# Patient Record
Sex: Male | Born: 1975
Health system: Southern US, Community
[De-identification: ages and names within clinical notes are randomized; demographics above are authoritative.]

## PROBLEM LIST (undated history)

## (undated) DIAGNOSIS — B1910 Unspecified viral hepatitis B without hepatic coma: Secondary | ICD-10-CM

## (undated) DIAGNOSIS — E1110 Type 2 diabetes mellitus with ketoacidosis without coma: Secondary | ICD-10-CM

## (undated) DIAGNOSIS — N289 Disorder of kidney and ureter, unspecified: Secondary | ICD-10-CM

## (undated) DIAGNOSIS — K219 Gastro-esophageal reflux disease without esophagitis: Secondary | ICD-10-CM

## (undated) DIAGNOSIS — I10 Essential (primary) hypertension: Secondary | ICD-10-CM

## (undated) DIAGNOSIS — I639 Cerebral infarction, unspecified: Secondary | ICD-10-CM

## (undated) DIAGNOSIS — M069 Rheumatoid arthritis, unspecified: Secondary | ICD-10-CM

## (undated) DIAGNOSIS — I2510 Atherosclerotic heart disease of native coronary artery without angina pectoris: Secondary | ICD-10-CM

## (undated) DIAGNOSIS — E119 Type 2 diabetes mellitus without complications: Secondary | ICD-10-CM

## (undated) HISTORY — PX: CORONARY ANGIOPLASTY WITH STENT PLACEMENT: SHX49

---

## 2017-02-02 DIAGNOSIS — I639 Cerebral infarction, unspecified: Secondary | ICD-10-CM

## 2017-02-02 HISTORY — DX: Cerebral infarction, unspecified: I63.9

## 2017-02-20 ENCOUNTER — Emergency Department (HOSPITAL_COMMUNITY)
Admission: EM | Admit: 2017-02-20 | Discharge: 2017-02-21 | Disposition: A | Discharge status: Home / Self Care | Payer: Medicaid Other | Attending: Emergency Medicine | Admitting: Emergency Medicine

## 2017-02-20 ENCOUNTER — Encounter (HOSPITAL_COMMUNITY): Payer: Self-pay | Admitting: *Deleted

## 2017-02-20 DIAGNOSIS — M545 Low back pain, unspecified: Secondary | ICD-10-CM

## 2017-02-20 DIAGNOSIS — R112 Nausea with vomiting, unspecified: Secondary | ICD-10-CM

## 2017-02-20 DIAGNOSIS — R197 Diarrhea, unspecified: Secondary | ICD-10-CM | POA: Diagnosis not present

## 2017-02-20 DIAGNOSIS — R109 Unspecified abdominal pain: Secondary | ICD-10-CM | POA: Diagnosis not present

## 2017-02-20 DIAGNOSIS — R739 Hyperglycemia, unspecified: Secondary | ICD-10-CM | POA: Insufficient documentation

## 2017-02-20 HISTORY — DX: Type 2 diabetes mellitus without complications: E11.9

## 2017-02-20 HISTORY — DX: Disorder of kidney and ureter, unspecified: N28.9

## 2017-02-20 HISTORY — DX: Cerebral infarction, unspecified: I63.9

## 2017-02-20 HISTORY — DX: Essential (primary) hypertension: I10

## 2017-02-20 LAB — CBC
HCT: 37.8 % — ABNORMAL LOW (ref 39.0–52.0)
Hemoglobin: 13 g/dL (ref 13.0–17.0)
MCH: 30 pg (ref 26.0–34.0)
MCHC: 34.4 g/dL (ref 30.0–36.0)
MCV: 87.1 fL (ref 78.0–100.0)
PLATELETS: 289 10*3/uL (ref 150–400)
RBC: 4.34 MIL/uL (ref 4.22–5.81)
RDW: 13.8 % (ref 11.5–15.5)
WBC: 9.4 10*3/uL (ref 4.0–10.5)

## 2017-02-20 LAB — BASIC METABOLIC PANEL
Anion gap: 6 (ref 5–15)
BUN: 19 mg/dL (ref 6–20)
CHLORIDE: 95 mmol/L — AB (ref 101–111)
CO2: 31 mmol/L (ref 22–32)
CREATININE: 0.95 mg/dL (ref 0.61–1.24)
Calcium: 9.4 mg/dL (ref 8.9–10.3)
GFR calc Af Amer: >60 mL/min (ref >60)
GFR calc non Af Amer: >60 mL/min (ref >60)
Glucose, Bld: 425 mg/dL — ABNORMAL HIGH (ref 65–99)
Potassium: 4.1 mmol/L (ref 3.5–5.1)
Sodium: 132 mmol/L — ABNORMAL LOW (ref 135–145)

## 2017-02-20 LAB — CBG MONITORING, ED
GLUCOSE-CAPILLARY: 451 mg/dL — AB (ref 65–99)
GLUCOSE-CAPILLARY: 466 mg/dL — AB (ref 65–99)

## 2017-02-20 LAB — URINALYSIS, ROUTINE W REFLEX MICROSCOPIC
BILIRUBIN URINE: NEGATIVE
Bacteria, UA: NONE SEEN
Glucose, UA: >=500 mg/dL — AB
Hgb urine dipstick: NEGATIVE
Ketones, ur: 5 mg/dL — AB
Leukocytes, UA: NEGATIVE
Nitrite: NEGATIVE
Protein, ur: >=300 mg/dL — AB
SPECIFIC GRAVITY, URINE: 1.036 — AB (ref 1.005–1.030)
pH: 5 (ref 5.0–8.0)

## 2017-02-20 NOTE — ED Notes (Signed)
Pt. States having inability to control bowel and bladder. Pt. States its been going on since Sunday. Pt. Had a stroke this month, approximately 02/05/2017.

## 2017-02-20 NOTE — ED Notes (Signed)
CBG 466 

## 2017-02-20 NOTE — ED Notes (Signed)
After looking through pt chart, no one ever placed any triage orders. Will place and bring pt to triage and obtain blood, CBG, UA

## 2017-02-20 NOTE — ED Triage Notes (Signed)
Pt reports being diabetic, reports cbg >400. Having n/v/d and incontinence since this am.

## 2017-02-21 ENCOUNTER — Emergency Department (HOSPITAL_COMMUNITY): Payer: Medicaid Other

## 2017-02-21 LAB — CBG MONITORING, ED: Glucose-Capillary: 274 mg/dL — ABNORMAL HIGH (ref 65–99)

## 2017-02-21 LAB — C DIFFICILE QUICK SCREEN W PCR REFLEX
C DIFFICILE (CDIFF) TOXIN: NEGATIVE
C Diff antigen: NEGATIVE
C Diff interpretation: NOT DETECTED

## 2017-02-21 MED ORDER — IOPAMIDOL (ISOVUE-300) INJECTION 61%
INTRAVENOUS | Status: AC
  Administered 2017-02-21: 100 mL
  Filled 2017-02-21: qty 100

## 2017-02-21 MED ORDER — MORPHINE SULFATE (PF) 4 MG/ML IV SOLN
4.0000 mg | Freq: Once | INTRAVENOUS | Status: AC
  Administered 2017-02-21: 4 mg via INTRAVENOUS
  Filled 2017-02-21: qty 1

## 2017-02-21 MED ORDER — INSULIN ASPART 100 UNIT/ML ~~LOC~~ SOLN
6.0000 [IU] | Freq: Once | SUBCUTANEOUS | Status: AC
  Administered 2017-02-21: 6 [IU] via INTRAVENOUS
  Filled 2017-02-21: qty 1

## 2017-02-21 MED ORDER — MORPHINE SULFATE (PF) 4 MG/ML IV SOLN
4.0000 mg | Freq: Once | INTRAVENOUS | Status: AC
  Administered 2017-02-21: 4 mg via INTRAVENOUS
  Filled 2017-02-21 (×2): qty 1

## 2017-02-21 MED ORDER — PROMETHAZINE HCL 25 MG PO TABS: 25.0000 mg | ORAL_TABLET | Freq: Four times a day (QID) | ORAL | 0 refills | Status: DC | PRN

## 2017-02-21 MED ORDER — ONDANSETRON HCL 4 MG/2ML IJ SOLN
4.0000 mg | Freq: Once | INTRAMUSCULAR | Status: AC
  Administered 2017-02-21: 4 mg via INTRAVENOUS
  Filled 2017-02-21: qty 2

## 2017-02-21 MED ORDER — LOPERAMIDE HCL 2 MG PO CAPS: 2.0000 mg | ORAL_CAPSULE | Freq: Four times a day (QID) | ORAL | 0 refills | Status: DC | PRN

## 2017-02-21 MED ORDER — SODIUM CHLORIDE 0.9 % IV SOLN
INTRAVENOUS | Status: DC
  Administered 2017-02-21: 01:00:00 via INTRAVENOUS

## 2017-02-21 MED ORDER — OXYCODONE-ACETAMINOPHEN 5-325 MG PO TABS: 1.0000 | ORAL_TABLET | Freq: Four times a day (QID) | ORAL | 0 refills | Status: DC | PRN

## 2017-02-21 NOTE — ED Notes (Signed)
Pt currently in CT.

## 2017-02-21 NOTE — ED Notes (Addendum)
Pt. To MRI via stretcher. 

## 2017-02-21 NOTE — ED Provider Notes (Signed)
TIME SEEN: 12:28 AM  CHIEF COMPLAINT: Multiple complaints  HPI: Patient is a 41 year old male with history of hypertension, insulin-dependent diabetes, recent stroke at the beginning of September who presents to the emergency department with multiple complaints. Unfortunately history is very limited as patient is a poor historian. His niece is at the bedside who does provide further details. Patient states that he has had nausea, vomiting and diarrhea for the past 3 days. Mostly diarrhea but did have 2 episodes of vomiting this morning. No bloody stools or melena. His blood sugar has also been elevated. He reports he does have insulin. No fever. He is having lower abdominal pain that he describes as a tightness. No dysuria or hematuria.   Patient also complaining of lower back pain which is chronic for him. No injury to his back. He states that he feels like his legs are weak. When I discussed this with him further he then states that it is his left leg is weak and his niece reports this is what was affected with his stroke. No numbness or tingling. He has no urinary retention but has reported some bowel incontinence but states that is because he feels he can't get to the bathroom fast enough because of his diarrhea. It is difficult to ascertain from him if he is having urinary incontinence but he denies urinary retention.  ROS: See HPI, very limited as patient is a poor historian Constitutional: no fever  Eyes: no drainage  ENT: no runny nose   Cardiovascular:  no chest pain  Resp: no SOB  GI: Vomiting and diarrhea GU: no dysuria Integumentary: no rash  Allergy: no hives  Musculoskeletal: no leg swelling  Neurological: no slurred speech ROS otherwise negative  PAST MEDICAL HISTORY/PAST SURGICAL HISTORY:  Past Medical History:  Diagnosis Date  . Diabetes mellitus without complication (HCC)   . Hypertension   . Renal disorder   . Stroke [facility])     MEDICATIONS:  Prior to Admission  medications   Medication Sig Start Date End Date Taking? Authorizing Provider  insulin NPH-regular Human (NOVOLIN 70/30) (70-30) 100 UNIT/ML injection Inject 30 Units into the skin 3 (three) times daily.   Yes [provider]    ALLERGIES:  No Known Allergies  SOCIAL HISTORY:  Social History  Substance Use Topics  . Smoking status: Current Some Day Smoker  . Smokeless tobacco: Not on file  . Alcohol use Yes    FAMILY HISTORY: History reviewed. No pertinent family history.  EXAM: BP (!) 122/97   Pulse 86   Temp 98.4 F (36.9 C) (Oral)   Resp 16   SpO2 98%  CONSTITUTIONAL: Alert and oriented and responds appropriately to questions; Chronically ill-appearing, afebrile, nontoxic HEAD: Normocephalic EYES: Conjunctivae clear, pupils appear equal, EOMI ENT: normal nose; moist mucous membranes NECK: Supple, no meningismus, no nuchal rigidity, no LAD  CARD: RRR; S1 and S2 appreciated; no murmurs, no clicks, no rubs, no gallops RESP: Normal chest excursion without splinting or tachypnea; breath sounds clear and equal bilaterally; no wheezes, no rhonchi, no rales, no hypoxia or respiratory distress, speaking full sentences ABD/GI: Normal bowel sounds; non-distended; soft, tender throughout the lower abdomen, no rebound, no guarding, no peritoneal signs, no hepatosplenomegaly RECTAL:  Normal soft brown stool, no blood or melena, normal rectal tone BACK:  The back appears normal and is tender diffusely throughout the lumbar spine, no thoracic tenderness, no midline step-off or deformity, there is no CVA tenderness EXT: Normal ROM in all joints; non-tender  to palpation; no edema; normal capillary refill; no cyanosis, no calf tenderness or swelling    SKIN: Normal color for age and race; warm; no rash NEURO: Moves all extremities equally, strength 5/5 in all 4 extremities, no saddle anesthesia, normal sensation, patient is able to ambulate with assistance, he has been using a cane  since discharge from the hospital after his stroke PSYCH: The patient's mood and manner are appropriate. Grooming and personal hygiene are appropriate.  MEDICAL DECISION MAKING: Patient here with multiple complaints. He is tender throughout his lower abdomen and complaining of vomiting and diarrhea. We have sent a stool culture. Labs obtained in triage are unremarkable other than hyperglycemia without DKA. We'll obtain a CT of his abdomen and pelvis for further evaluation. Will give IV fluids, pain and nausea medicine and reassess.   Patient also complaining of back pain and feels like his legs are weak. On my examination he has great strength and normal sensation. He has normal rectal tone but describes fecal incontinence. This may be because of his diarrhea. Unfortunately he is a very poor historian and is uncooperative with a lot of the exam. No thoracic back pain. We'll obtain an MRI of his spine without contrast to rule out cauda equina. Denies any new injury to the back. I do not feel this time he needs MRIs of his cervical and thoracic spine. Pain is all in the lower back. He has normal neuro exam on my evaluation except he does ambulate with a slight limp but his niece reports she thinks this is because of some weakness in his left leg from his recent stroke. We'll also attempt to obtain outside hospital records from [place]. Niece reports that she brought him here from [place] because "he is too sick" to stand [place] alone. She reports that he was told that he had possible CHF and had to have dialysis during his last admission. Here his creatinine is normal.  ED PROGRESS: Patient's CT of his abdomen and pelvis shows no acute abnormality. MRI of the lumbar spine shows no cauda equina or high-grade spinal stenosis. He does have mild canal stenosis at L4-L5 due to a small disc bulge. This could be causing his pain but I doubt this is the cause of his neurologic deficits. Also mild right and  mild-to-moderate left L5-S1 neural foraminal stenosis. He does not have any radicular symptoms. I do not feel he needs emergent neurosurgical consultation. He has not had any vomiting here. I suspect viral gastroenteritis and exacerbation of his chronic symptoms. Have given him a prescription for a walker which may help him get around. Also given a list of primary care physicians in this area to establish care with. We'll discharge with brief course of Percocet for pain control as well as nausea medicine and medicine for diarrhea. I feel he is safe to be discharged. I do not feel he needs antibiotics, admission. His blood sugar has improved to the 270s with gentle IV hydration and insulin. Patient and niece are comfortable with this plan. We have discussed at length return precautions.    Patient's C. difficile has come back negative. I have received records from [facility] in [place]. It appears he was seen in the emergency department at that time for back pain. He had an x-ray of his lumbar spine which showed minimal grade 1 retrolisthesis of L1 over L2 and L2 over L3. They document that he had recently been admitted to another hospital,  [facility] for hyperglycemia due to medical noncompliance. They did not send any records stating that he was admitted to the hospital or had a stroke. We did not receive records from [facility].    At this time, I do not feel there is any life-threatening condition present. I have reviewed and discussed all results (EKG, imaging, lab, urine as appropriate) and exam findings with patient/family. I have reviewed nursing notes and appropriate previous records.  I feel the patient is safe to be discharged home without further emergent workup and can continue workup as an outpatient as needed. Discussed usual and customary return precautions. Patient/family verbalize understanding and are comfortable with this plan.  Outpatient follow-up has been  provided if needed. All questions have been answered.      Ward, Layla Maw, DO 02/21/17 [phone]

## 2017-02-21 NOTE — Discharge Instructions (Signed)
To find a primary care or specialty doctor please call [phone] or [phone] to access "[facility] Find a Doctor Service." ° °You may also go on the [facility] website at [email or web address] ° °There are also multiple [facility], [facility] practices throughout the [place] that are frequently accepting new patients. You may find a clinic that is close to your home and contact them. ° °[facility] -  °[address] °[place] [address] °[phone] ° ° °[facility] -  °[address] °[place] [address] [phone] ° ° °[facility] - °[address]  °[place] [address] °[phone] ° ° °

## 2017-02-21 NOTE — ED Notes (Signed)
Pt returned from CT °

## 2017-02-21 NOTE — ED Notes (Signed)
Pt. Unable to sign due to technology issues.

## 2017-03-08 ENCOUNTER — Emergency Department (HOSPITAL_COMMUNITY): Payer: Medicaid Other

## 2017-03-08 ENCOUNTER — Encounter (HOSPITAL_COMMUNITY): Payer: Self-pay | Admitting: Emergency Medicine

## 2017-03-08 ENCOUNTER — Inpatient Hospital Stay (HOSPITAL_COMMUNITY)
Admission: EM | Admit: 2017-03-08 | Discharge: 2017-03-12 | DRG: 638 | Disposition: A | Discharge status: Home Health | Payer: Medicaid Other | Attending: Internal Medicine | Admitting: Internal Medicine

## 2017-03-08 DIAGNOSIS — I2510 Atherosclerotic heart disease of native coronary artery without angina pectoris: Secondary | ICD-10-CM | POA: Diagnosis present

## 2017-03-08 DIAGNOSIS — K219 Gastro-esophageal reflux disease without esophagitis: Secondary | ICD-10-CM | POA: Diagnosis present

## 2017-03-08 DIAGNOSIS — E1065 Type 1 diabetes mellitus with hyperglycemia: Secondary | ICD-10-CM

## 2017-03-08 DIAGNOSIS — Z9111 Patient's noncompliance with dietary regimen: Secondary | ICD-10-CM | POA: Diagnosis not present

## 2017-03-08 DIAGNOSIS — E1040 Type 1 diabetes mellitus with diabetic neuropathy, unspecified: Secondary | ICD-10-CM | POA: Diagnosis present

## 2017-03-08 DIAGNOSIS — E860 Dehydration: Secondary | ICD-10-CM | POA: Diagnosis present

## 2017-03-08 DIAGNOSIS — R112 Nausea with vomiting, unspecified: Secondary | ICD-10-CM | POA: Diagnosis present

## 2017-03-08 DIAGNOSIS — E875 Hyperkalemia: Secondary | ICD-10-CM | POA: Diagnosis present

## 2017-03-08 DIAGNOSIS — I10 Essential (primary) hypertension: Secondary | ICD-10-CM | POA: Diagnosis present

## 2017-03-08 DIAGNOSIS — E1010 Type 1 diabetes mellitus with ketoacidosis without coma: Secondary | ICD-10-CM | POA: Diagnosis present

## 2017-03-08 DIAGNOSIS — M62838 Other muscle spasm: Secondary | ICD-10-CM | POA: Diagnosis present

## 2017-03-08 DIAGNOSIS — Z79899 Other long term (current) drug therapy: Secondary | ICD-10-CM

## 2017-03-08 DIAGNOSIS — E871 Hypo-osmolality and hyponatremia: Secondary | ICD-10-CM | POA: Diagnosis present

## 2017-03-08 DIAGNOSIS — Z955 Presence of coronary angioplasty implant and graft: Secondary | ICD-10-CM | POA: Diagnosis not present

## 2017-03-08 DIAGNOSIS — N179 Acute kidney failure, unspecified: Secondary | ICD-10-CM | POA: Diagnosis present

## 2017-03-08 DIAGNOSIS — F17200 Nicotine dependence, unspecified, uncomplicated: Secondary | ICD-10-CM | POA: Diagnosis present

## 2017-03-08 DIAGNOSIS — Z794 Long term (current) use of insulin: Secondary | ICD-10-CM

## 2017-03-08 DIAGNOSIS — Z9114 Patient's other noncompliance with medication regimen: Secondary | ICD-10-CM

## 2017-03-08 DIAGNOSIS — G629 Polyneuropathy, unspecified: Secondary | ICD-10-CM

## 2017-03-08 DIAGNOSIS — Z8673 Personal history of transient ischemic attack (TIA), and cerebral infarction without residual deficits: Secondary | ICD-10-CM | POA: Diagnosis not present

## 2017-03-08 DIAGNOSIS — E0810 Diabetes mellitus due to underlying condition with ketoacidosis without coma: Secondary | ICD-10-CM

## 2017-03-08 DIAGNOSIS — R739 Hyperglycemia, unspecified: Secondary | ICD-10-CM | POA: Diagnosis present

## 2017-03-08 LAB — RAPID URINE DRUG SCREEN, HOSP PERFORMED
Amphetamines: NOT DETECTED
BARBITURATES: NOT DETECTED
Benzodiazepines: NOT DETECTED
COCAINE: NOT DETECTED
Opiates: NOT DETECTED
TETRAHYDROCANNABINOL: POSITIVE — AB

## 2017-03-08 LAB — COMPREHENSIVE METABOLIC PANEL
ALBUMIN: 3.6 g/dL (ref 3.5–5.0)
ALT: 159 U/L — ABNORMAL HIGH (ref 17–63)
ANION GAP: 27 — AB (ref 5–15)
AST: 153 U/L — AB (ref 15–41)
Alkaline Phosphatase: 122 U/L (ref 38–126)
BUN: 25 mg/dL — AB (ref 6–20)
CHLORIDE: 87 mmol/L — AB (ref 101–111)
CO2: 12 mmol/L — ABNORMAL LOW (ref 22–32)
Calcium: 9.6 mg/dL (ref 8.9–10.3)
Creatinine, Ser: 1.76 mg/dL — ABNORMAL HIGH (ref 0.61–1.24)
GFR calc Af Amer: 53 mL/min — ABNORMAL LOW (ref >60)
GFR calc non Af Amer: 46 mL/min — ABNORMAL LOW (ref >60)
GLUCOSE: 676 mg/dL — AB (ref 65–99)
POTASSIUM: 6.3 mmol/L — AB (ref 3.5–5.1)
Sodium: 126 mmol/L — ABNORMAL LOW (ref 135–145)
Total Bilirubin: 2.3 mg/dL — ABNORMAL HIGH (ref 0.3–1.2)
Total Protein: 9.4 g/dL — ABNORMAL HIGH (ref 6.5–8.1)

## 2017-03-08 LAB — URINALYSIS, ROUTINE W REFLEX MICROSCOPIC
Bacteria, UA: NONE SEEN
Bilirubin Urine: NEGATIVE
Ketones, ur: 80 mg/dL — AB
LEUKOCYTES UA: NEGATIVE
NITRITE: NEGATIVE
PH: 5 (ref 5.0–8.0)
PROTEIN: 100 mg/dL — AB
SPECIFIC GRAVITY, URINE: 1.021 (ref 1.005–1.030)
Squamous Epithelial / LPF: NONE SEEN

## 2017-03-08 LAB — BASIC METABOLIC PANEL
Anion gap: 20 — ABNORMAL HIGH (ref 5–15)
Anion gap: 14 (ref 5–15)
BUN: 29 mg/dL — AB (ref 6–20)
BUN: 28 mg/dL — AB (ref 6–20)
CALCIUM: 8.8 mg/dL — AB (ref 8.9–10.3)
CALCIUM: 8.6 mg/dL — AB (ref 8.9–10.3)
CHLORIDE: 99 mmol/L — AB (ref 101–111)
CO2: 16 mmol/L — ABNORMAL LOW (ref 22–32)
CO2: 20 mmol/L — ABNORMAL LOW (ref 22–32)
CREATININE: 1.68 mg/dL — AB (ref 0.61–1.24)
CREATININE: 1.57 mg/dL — AB (ref 0.61–1.24)
Chloride: 101 mmol/L (ref 101–111)
GFR, EST AFRICAN AMERICAN: 56 mL/min — AB (ref >60)
GFR, EST NON AFRICAN AMERICAN: 49 mL/min — AB (ref >60)
GFR, EST NON AFRICAN AMERICAN: 53 mL/min — AB (ref >60)
Glucose, Bld: 363 mg/dL — ABNORMAL HIGH (ref 65–99)
Glucose, Bld: 217 mg/dL — ABNORMAL HIGH (ref 65–99)
Potassium: 4.2 mmol/L (ref 3.5–5.1)
Potassium: 3.8 mmol/L (ref 3.5–5.1)
SODIUM: 135 mmol/L (ref 135–145)
SODIUM: 135 mmol/L (ref 135–145)

## 2017-03-08 LAB — CBC
HCT: 37.6 % — ABNORMAL LOW (ref 39.0–52.0)
HEMATOCRIT: 42.8 % (ref 39.0–52.0)
Hemoglobin: 14.9 g/dL (ref 13.0–17.0)
Hemoglobin: 13.1 g/dL (ref 13.0–17.0)
MCH: 31.3 pg (ref 26.0–34.0)
MCH: 30.5 pg (ref 26.0–34.0)
MCHC: 34.8 g/dL (ref 30.0–36.0)
MCHC: 34.8 g/dL (ref 30.0–36.0)
MCV: 89.9 fL (ref 78.0–100.0)
MCV: 87.4 fL (ref 78.0–100.0)
PLATELETS: 302 10*3/uL (ref 150–400)
PLATELETS: 253 10*3/uL (ref 150–400)
RBC: 4.76 MIL/uL (ref 4.22–5.81)
RBC: 4.3 MIL/uL (ref 4.22–5.81)
RDW: 14.2 % (ref 11.5–15.5)
RDW: 14.5 % (ref 11.5–15.5)
WBC: 11.9 10*3/uL — AB (ref 4.0–10.5)
WBC: 14.6 10*3/uL — ABNORMAL HIGH (ref 4.0–10.5)

## 2017-03-08 LAB — BRAIN NATRIURETIC PEPTIDE: B NATRIURETIC PEPTIDE 5: 55.1 pg/mL (ref 0.0–100.0)

## 2017-03-08 LAB — I-STAT TROPONIN, ED: TROPONIN I, POC: 0 ng/mL (ref 0.00–0.08)

## 2017-03-08 LAB — CBG MONITORING, ED
GLUCOSE-CAPILLARY: 501 mg/dL — AB (ref 65–99)
GLUCOSE-CAPILLARY: 444 mg/dL — AB (ref 65–99)
GLUCOSE-CAPILLARY: 364 mg/dL — AB (ref 65–99)
Glucose-Capillary: >600 mg/dL (ref 65–99)
Glucose-Capillary: 597 mg/dL (ref 65–99)

## 2017-03-08 LAB — BLOOD GAS, VENOUS
ACID-BASE DEFICIT: 15.3 mmol/L — AB (ref 0.0–2.0)
BICARBONATE: 9.9 mmol/L — AB (ref 20.0–28.0)
FIO2: 21
O2 SAT: 81.2 %
PCO2 VEN: 22.4 mmHg — AB (ref 44.0–60.0)
PH VEN: 7.27 (ref 7.250–7.430)
Patient temperature: 98.6
pO2, Ven: 55.2 mmHg — ABNORMAL HIGH (ref 32.0–45.0)

## 2017-03-08 LAB — MAGNESIUM: MAGNESIUM: 1.9 mg/dL (ref 1.7–2.4)

## 2017-03-08 LAB — PHOSPHORUS: PHOSPHORUS: 4.2 mg/dL (ref 2.5–4.6)

## 2017-03-08 LAB — LIPASE, BLOOD: Lipase: 27 U/L (ref 11–51)

## 2017-03-08 LAB — D-DIMER, QUANTITATIVE (NOT AT ARMC): D DIMER QUANT: 0.35 ug{FEU}/mL (ref 0.00–0.50)

## 2017-03-08 LAB — GLUCOSE, CAPILLARY: GLUCOSE-CAPILLARY: 290 mg/dL — AB (ref 65–99)

## 2017-03-08 MED ORDER — INSULIN ASPART 100 UNIT/ML IV SOLN
10.0000 [IU] | Freq: Once | INTRAVENOUS | Status: DC
  Filled 2017-03-08: qty 0.1

## 2017-03-08 MED ORDER — HEPARIN SODIUM (PORCINE) 5000 UNIT/ML IJ SOLN
5000.0000 [IU] | Freq: Three times a day (TID) | INTRAMUSCULAR | Status: DC
  Administered 2017-03-09 – 2017-03-12 (×11): 5000 [IU] via SUBCUTANEOUS
  Filled 2017-03-08 (×10): qty 1

## 2017-03-08 MED ORDER — ONDANSETRON HCL 4 MG/2ML IJ SOLN
4.0000 mg | Freq: Four times a day (QID) | INTRAMUSCULAR | Status: DC | PRN
  Administered 2017-03-11 – 2017-03-12 (×2): 4 mg via INTRAVENOUS
  Filled 2017-03-08 (×2): qty 2

## 2017-03-08 MED ORDER — SODIUM CHLORIDE 0.9 % IV BOLUS (SEPSIS)
1000.0000 mL | Freq: Once | INTRAVENOUS | Status: AC
  Administered 2017-03-08: 1000 mL via INTRAVENOUS

## 2017-03-08 MED ORDER — ONDANSETRON HCL 4 MG/2ML IJ SOLN
4.0000 mg | Freq: Once | INTRAMUSCULAR | Status: AC
  Administered 2017-03-08: 4 mg via INTRAVENOUS
  Filled 2017-03-08: qty 2

## 2017-03-08 MED ORDER — PROMETHAZINE HCL 25 MG/ML IJ SOLN: 25.0000 mg | Freq: Three times a day (TID) | INTRAMUSCULAR | Status: DC | PRN

## 2017-03-08 MED ORDER — PANTOPRAZOLE SODIUM 40 MG IV SOLR
INTRAVENOUS | Status: AC
  Administered 2017-03-08: 40 mg via INTRAVENOUS
  Filled 2017-03-08: qty 40

## 2017-03-08 MED ORDER — GI COCKTAIL ~~LOC~~
30.0000 mL | Freq: Once | ORAL | Status: AC
  Administered 2017-03-08: 30 mL via ORAL
  Filled 2017-03-08: qty 30

## 2017-03-08 MED ORDER — DEXTROSE-NACL 5-0.45 % IV SOLN: INTRAVENOUS | Status: DC

## 2017-03-08 MED ORDER — SODIUM CHLORIDE 0.9 % IV SOLN
INTRAVENOUS | Status: DC
  Administered 2017-03-08: 18:00:00 via INTRAVENOUS

## 2017-03-08 MED ORDER — PROMETHAZINE HCL 25 MG/ML IJ SOLN
25.0000 mg | Freq: Once | INTRAMUSCULAR | Status: DC
  Filled 2017-03-08: qty 1

## 2017-03-08 MED ORDER — SODIUM CHLORIDE 0.9 % IV SOLN
INTRAVENOUS | Status: DC
  Administered 2017-03-08: 5.4 [IU]/h via INTRAVENOUS
  Administered 2017-03-08: 10.7 [IU]/h via INTRAVENOUS
  Filled 2017-03-08: qty 1

## 2017-03-08 MED ORDER — PANTOPRAZOLE SODIUM 40 MG IV SOLR
40.0000 mg | INTRAVENOUS | Status: DC
  Administered 2017-03-08 – 2017-03-10 (×3): 40 mg via INTRAVENOUS
  Filled 2017-03-08 (×2): qty 40

## 2017-03-08 MED ORDER — ACETAMINOPHEN 325 MG PO TABS
650.0000 mg | ORAL_TABLET | Freq: Four times a day (QID) | ORAL | Status: DC | PRN
  Administered 2017-03-09: 23:00:00 650 mg via ORAL
  Filled 2017-03-08: qty 2

## 2017-03-08 MED ORDER — DEXTROSE-NACL 5-0.45 % IV SOLN
INTRAVENOUS | Status: DC
  Administered 2017-03-08 – 2017-03-09 (×2): via INTRAVENOUS

## 2017-03-08 MED ORDER — SODIUM CHLORIDE 0.9 % IV SOLN
INTRAVENOUS | Status: DC
  Administered 2017-03-08: 20:00:00 via INTRAVENOUS

## 2017-03-08 MED ORDER — MORPHINE SULFATE (PF) 4 MG/ML IV SOLN
2.0000 mg | INTRAVENOUS | Status: DC | PRN
  Administered 2017-03-10 – 2017-03-11 (×3): 2 mg via INTRAVENOUS
  Filled 2017-03-08 (×3): qty 1

## 2017-03-08 MED ORDER — SODIUM CHLORIDE 0.9 % IV SOLN
INTRAVENOUS | Status: DC
  Administered 2017-03-08: 11.5 [IU]/h via INTRAVENOUS

## 2017-03-08 NOTE — ED Notes (Signed)
ATTEMPT 2ND IV NOT SUCCESSFUL X 2.

## 2017-03-08 NOTE — H&P (Signed)
History and Physical    Matthew Riley GUR:[id number] DOB: 1975-10-28 DOA: 03/08/2017  PCP: Patient, No Pcp Per   I have briefly reviewed patients previous medical reports in [facility].  Patient coming from: home  Chief Complaint: abd pain, nausea/vomiting and high sugars.  HPI: Matthew Riley is a 41 y/o male with PMH significant for HTN, type 1 diabetes with renal disease and prior hx of CAD status post stenting; who relocated from [place] couple months ago and for the last 4 weeks or so has been w/o insulin treatment. Patient presented with 3-4 days of generalized weakness and tiredness, positive nausea, vomiting, abd pain and difficulty keeping anything down. Patient denies CP, SOB, hematuria, dysuria, HA;s or any other complaints.   ED Course: patient found to be in DKA (with CBG's > 600, anion gap 27 and CO2 12). Patient also found AKI. He received IVF's and started on insulin drip.  Review of Systems:  All other systems reviewed and apart from HPI, are negative.  Past Medical History:  Diagnosis Date  . Diabetes mellitus without complication (HCC)   . Hypertension   . Renal disorder   . Stroke [facility])     Past Surgical History:  Procedure Laterality Date  . CORONARY ANGIOPLASTY WITH STENT PLACEMENT      Social History  reports that he has been smoking.  He does not have any smokeless tobacco history on file. He reports that he drinks alcohol. He reports that he does not use drugs.  No Known Allergies  No family history on file.   Prior to Admission medications   Medication Sig Start Date End Date Taking? Authorizing Provider  insulin NPH-regular Human (NOVOLIN 70/30) (70-30) 100 UNIT/ML injection Inject 30 Units into the skin 3 (three) times daily.   Yes [provider]  loperamide (IMODIUM) 2 MG capsule Take 1 capsule (2 mg total) by mouth 4 (four) times daily as needed for diarrhea or loose stools. Patient not taking: Reported on 03/08/2017 02/21/17    Ward, Layla Maw, DO  oxyCODONE-acetaminophen (PERCOCET/ROXICET) 5-325 MG tablet Take 1-2 tablets by mouth every 6 (six) hours as needed. 02/21/17   Ward, Layla Maw, DO  promethazine (PHENERGAN) 25 MG tablet Take 1 tablet (25 mg total) by mouth every 6 (six) hours as needed for nausea or vomiting. Patient not taking: Reported on 03/08/2017 02/21/17   Ward, Layla Maw, DO    Physical Exam: Vitals:   03/08/17 1418 03/08/17 1618 03/08/17 1628 03/08/17 1700  BP: 108/70  (!) 93/57 111/72  Pulse: (!) 113  (!) 109 99  Resp: (!) 23  (!) 24 (!) 23  Temp:  98.4 F (36.9 C)    TempSrc:  Axillary    SpO2: 99%  100% 100%  Weight:  90.7 kg (200 lb)    Height:  6\' 1"  (1.854 m)        Constitutional: no fever, nCP and no SOB. Patient reporting feeling weak and tired. Eyes: PERTLA, lids and conjunctivae normal, no icterus ENMT: Mucous membranes are dry on exam; Posterior pharynx clear of any exudate or lesions. Poor dentition. Neck: supple, no masses, no thyromegaly, no bruits Respiratory: good air movement, no wheezing, no crackles clear to auscultation bilaterally, Normal respiratory effort.  Cardiovascular: tachycardia, S1 & S2 heard, no murmurs / rubs / gallops. No extremity edema. 2+ pedal pulses. No carotid bruits.  Abdomen: no guarding, positive BS, No distension, positive tenderness to palpation, no masses appreciated. hepatosplenomegaly. Bowel sounds normal.  Musculoskeletal: no  clubbing / cyanosis. No joint deformity upper and lower extremities. Good ROM, no contractures. Normal muscle tone.  Skin: no rashes, lesions, ulcers. No induration Neurologic: CN 2-12 grossly intact. Sensation intact, DTR normal. Strength 3/5 in all 4 three limbs. Except for complaints in movement and strength on LLE Psychiatric: Normal judgment and insight. Alert and oriented x 2-3 currently. Normal mood.   Labs on Admission: I have personally reviewed following labs and imaging studies  CBC:  Recent Labs Lab  03/08/17 1450  WBC 11.9*  HGB 14.9  HCT 42.8  MCV 89.9  PLT 302   Basic Metabolic Panel:  Recent Labs Lab 03/08/17 1450  NA 126*  K 6.3*  CL 87*  CO2 12*  GLUCOSE 676*  BUN 25*  CREATININE 1.76*  CALCIUM 9.6   Liver Function Tests:  Recent Labs Lab 03/08/17 1450  AST 153*  ALT 159*  ALKPHOS 122  BILITOT 2.3*  PROT 9.4*  ALBUMIN 3.6   ]CBG:  Recent Labs Lab 03/08/17 1313 03/08/17 1745 03/08/17 1902  GLUCAP >600* 597* 501*   Urine analysis:    Component Value Date/Time   COLORURINE YELLOW 03/08/2017 1748   APPEARANCEUR CLEAR 03/08/2017 1748   LABSPEC 1.021 03/08/2017 1748   PHURINE 5.0 03/08/2017 1748   GLUCOSEU >=500 (A) 03/08/2017 1748   HGBUR MODERATE (A) 03/08/2017 1748   BILIRUBINUR NEGATIVE 03/08/2017 1748   KETONESUR 80 (A) 03/08/2017 1748   PROTEINUR 100 (A) 03/08/2017 1748   NITRITE NEGATIVE 03/08/2017 1748   LEUKOCYTESUR NEGATIVE 03/08/2017 1748     Radiological Exams on Admission: Ct Abdomen Pelvis Wo Contrast  Result Date: 03/08/2017 CLINICAL DATA:  Generalized abdominal pain. EXAM: CT ABDOMEN AND PELVIS WITHOUT CONTRAST TECHNIQUE: Multidetector CT imaging of the abdomen and pelvis was performed following the standard protocol without IV contrast. COMPARISON:  CT abdomen pelvis dated March 01, 2017. FINDINGS: Lower chest: No acute abnormality. Hepatobiliary: No focal liver abnormality is seen. No gallstones, gallbladder wall thickening, or biliary dilatation. Pancreas: Unremarkable. No pancreatic ductal dilatation or surrounding inflammatory changes. Spleen: Normal in size without focal abnormality. Adrenals/Urinary Tract: Adrenal glands are unremarkable. Mild fullness of the left greater than right renal collecting systems. No renal calculi. No hydroureter. Bladder is unremarkable. Stomach/Bowel: Stomach is within normal limits. Appendix is not well seen, however there are no secondary signs of inflammation in the right lower quadrant. No  evidence of bowel wall thickening, distention, or inflammatory changes. Prominent stool throughout the colon. Vascular/Lymphatic: Minimal aortic atherosclerosis. No enlarged abdominal or pelvic lymph nodes. Reproductive: Prostate is unremarkable. Other: Mild mesenteric edema.  No pneumoperitoneum or free fluid. Musculoskeletal: No acute or significant osseous findings. IMPRESSION: 1. Limited evaluation without intravenous contrast. No definite acute intra-abdominal process. Mild fullness of the left greater than right renal collecting systems without definite evidence of obstruction. 2. Mild mesenteric and generalized subcutaneous edema. 3.  Prominent stool throughout the colon favors constipation. Electronically Signed   By: Obie Dredge M.D.   On: 03/08/2017 17:55   Dg Chest 2 View  Result Date: 03/08/2017 CLINICAL DATA:  Shortness of breath. EXAM: CHEST  2 VIEW COMPARISON:  None. FINDINGS: The heart size and mediastinal contours are within normal limits. Both lungs are clear. The visualized skeletal structures are unremarkable. IMPRESSION: Normal chest x-ray. Electronically Signed   By: Obie Dredge M.D.   On: 03/08/2017 13:32    EKG: Independently reviewed. No signs of ischemia appreciated.  Assessment/Plan Principal Problem:   DKA, type 1 (HCC) Active Problems:  Nausea & vomiting   GERD (gastroesophageal reflux disease)   Hyponatremia   AKI (acute kidney injury) (HCC)   Hyperkalemia   Type 1 diabetes with DKA: most likely from medication no complaince -troponin neg -will provide IVF's resuscitation and start insulin drip as per DKA protocol. -NPO until DKA resolved.  AKI -due to dehydration/pre-renal azotemia -will continue IVF's Minimize/avois use of nephrotoxic agents   GERD -continue PPI -PRN antiemetics  Hyponatremia/hyperkalemia -pseudo-hyponatremia -associated with dehydration and high CBG's -will continue IVF's -expecting use of insulin to help with potassium  level -will follow electrolytes  LLE weakness -after proper IVF's resuscitation and electrolytes repletion, will have PT evaluating patient and if needed will check lower back images; patient reporting some lower back pain intermittently.  DVT prophylaxis: heparin  Code Status: Full Family Communication: no family at bedside  Disposition Plan: to be determine; but anticipate discharge back home once medically stable. Consults called: none  Admission status: inpatient, stepdown, LOS > 2 midnights.   Vassie Loll MD [facility] Pager [phone]  If 7PM-7AM, please contact night-coverage [email or web address] Password [email or web address]  03/08/2017, 7:23 PM

## 2017-03-08 NOTE — ED Notes (Signed)
ED Provider at bedside.[place] AT BEDSIDE. PT AWARE OF ADMISSION

## 2017-03-08 NOTE — ED Notes (Signed)
While attending to other pt's This pt moved into assignment.

## 2017-03-08 NOTE — ED Provider Notes (Signed)
[facility]-EMERGENCY DEPT Provider Note   CSN: [id number] Arrival date & time: 03/08/17  1221     History   Chief Complaint Chief Complaint  Patient presents with  . Hyperglycemia    HPI Matthew Riley is a 41 y.o. male.  HPI Patient is a poor historian. Presents with multiple complaints. States he ran out of his insulin 2 days ago. His blood sugar has been elevated. Also complains of epigastric pain radiating into the chest. He's had multiple episodes of vomiting which is typically post tussive. States he's had increased shortness of breath especially with exertion. He continues to have low back pain. Has left leg weakness but denies any sensory changes. No urinary or bowel incontinence. No diarrhea. States he's had increased swelling to his left leg with ongoing pain. No recent trauma. Past Medical History:  Diagnosis Date  . Diabetes mellitus without complication (HCC)   . Hypertension   . Renal disorder   . Stroke [facility])     Patient Active Problem List   Diagnosis Date Noted  . DKA, type 1 (HCC) 03/08/2017    Past Surgical History:  Procedure Laterality Date  . CORONARY ANGIOPLASTY WITH STENT PLACEMENT         Home Medications    Prior to Admission medications   Medication Sig Start Date End Date Taking? Authorizing Provider  insulin NPH-regular Human (NOVOLIN 70/30) (70-30) 100 UNIT/ML injection Inject 30 Units into the skin 3 (three) times daily.   Yes [provider]  loperamide (IMODIUM) 2 MG capsule Take 1 capsule (2 mg total) by mouth 4 (four) times daily as needed for diarrhea or loose stools. Patient not taking: Reported on 03/08/2017 02/21/17   Ward, Layla Maw, DO  oxyCODONE-acetaminophen (PERCOCET/ROXICET) 5-325 MG tablet Take 1-2 tablets by mouth every 6 (six) hours as needed. 02/21/17   Ward, Layla Maw, DO  promethazine (PHENERGAN) 25 MG tablet Take 1 tablet (25 mg total) by mouth every 6 (six) hours as needed for nausea or vomiting. Patient not  taking: Reported on 03/08/2017 02/21/17   Ward, Layla Maw, DO    Family History No family history on file.  Social History Social History  Substance Use Topics  . Smoking status: Current Some Day Smoker  . Smokeless tobacco: Not on file  . Alcohol use Yes     Allergies   Patient has no known allergies.   Review of Systems Review of Systems  Constitutional: Positive for fatigue. Negative for chills and fever.  HENT: Negative for congestion and sinus pressure.   Eyes: Negative for visual disturbance.  Respiratory: Positive for cough and shortness of breath. Negative for wheezing.   Cardiovascular: Positive for chest pain and leg swelling. Negative for palpitations.  Gastrointestinal: Positive for abdominal pain, nausea and vomiting. Negative for blood in stool, constipation and diarrhea.  Genitourinary: Negative for dysuria, flank pain, frequency and hematuria.  Musculoskeletal: Positive for back pain and myalgias. Negative for neck pain and neck stiffness.  Skin: Negative for rash and wound.  Neurological: Positive for weakness. Negative for dizziness, light-headedness, numbness and headaches.  All other systems reviewed and are negative.    Physical Exam Updated Vital Signs BP (!) 93/57   Pulse (!) 109   Temp 98.4 F (36.9 C) (Axillary)   Resp (!) 24   Ht 6\' 1"  (1.854 m)   Wt 90.7 kg (200 lb)   SpO2 100%   BMI 26.39 kg/m   Physical Exam  Constitutional: He is oriented to person, place, and  time. He appears well-developed and well-nourished. No distress.  Disheveled  HENT:  Head: Normocephalic and atraumatic.  Mouth/Throat: Oropharynx is clear and moist. No oropharyngeal exudate.  Eyes: Pupils are equal, round, and reactive to light. EOM are normal.  Neck: Normal range of motion. Neck supple. No JVD present.  Cardiovascular: Normal rate and regular rhythm.  Exam reveals no gallop and no friction rub.   No murmur heard. Pulmonary/Chest: Effort normal and breath  sounds normal. No respiratory distress. He has no wheezes. He has no rales. He exhibits no tenderness.  Abdominal: Soft. Bowel sounds are normal. There is tenderness. There is no rebound and no guarding.  Epigastric tenderness to palpation. No rebound or guarding.  Musculoskeletal: Normal range of motion. He exhibits no edema or tenderness.  Patient with midline lumbar tenderness to palpation. No step-offs or deformities. Mild increase swelling in the left lower extremity compared to the right. Some mild left calf tenderness. Distal pulses are 2+.  Neurological: He is alert and oriented to person, place, and time.  5/5 motor in bilateral upper extremities. 5/5 motor and right lower extremity. 3/5 motor in left lower extremity. Sensation to light touch is intact.  Skin: Skin is warm and dry. Capillary refill takes less than 2 seconds. No rash noted. No erythema.  Psychiatric: He has a normal mood and affect. His behavior is normal.  Nursing note and vitals reviewed.    ED Treatments / Results  Labs (all labs ordered are listed, but only abnormal results are displayed) Labs Reviewed  CBC - Abnormal; Notable for the following:       Result Value   WBC 11.9 (*)    All other components within normal limits  COMPREHENSIVE METABOLIC PANEL - Abnormal; Notable for the following:    Sodium 126 (*)    Potassium 6.3 (*)    Chloride 87 (*)    CO2 12 (*)    Glucose, Bld 676 (*)    BUN 25 (*)    Creatinine, Ser 1.76 (*)    Total Protein 9.4 (*)    AST 153 (*)    ALT 159 (*)    Total Bilirubin 2.3 (*)    GFR calc non Af Amer 46 (*)    GFR calc Af Amer 53 (*)    Anion gap 27 (*)    All other components within normal limits  BLOOD GAS, VENOUS - Abnormal; Notable for the following:    pCO2, Ven 22.4 (*)    pO2, Ven 55.2 (*)    Bicarbonate 9.9 (*)    Acid-base deficit 15.3 (*)    All other components within normal limits  CBG MONITORING, ED - Abnormal; Notable for the following:     Glucose-Capillary >600 (*)    All other components within normal limits  D-DIMER, QUANTITATIVE (NOT AT [facility])  LIPASE, BLOOD  BRAIN NATRIURETIC PEPTIDE  URINALYSIS, ROUTINE W REFLEX MICROSCOPIC  RAPID URINE DRUG SCREEN, HOSP PERFORMED  I-STAT TROPONIN, ED    EKG  EKG Interpretation  Date/Time:  Friday March 08 2017 13:11:53 EDT Ventricular Rate:  109 PR Interval:    QRS Duration: 88 QT Interval:  358 QTC Calculation: 483 R Axis:   54 Text Interpretation:  Sinus tachycardia Anteroseptal infarct, age indeterminate Confirmed by Loren Racer ([id number]) on 03/08/2017 4:01:13 PM       Radiology Dg Chest 2 View  Result Date: 03/08/2017 CLINICAL DATA:  Shortness of breath. EXAM: CHEST  2 VIEW COMPARISON:  None. FINDINGS: The heart  size and mediastinal contours are within normal limits. Both lungs are clear. The visualized skeletal structures are unremarkable. IMPRESSION: Normal chest x-ray. Electronically Signed   By: Obie Dredge M.D.   On: 03/08/2017 13:32    Procedures Procedures (including critical care time)  Medications Ordered in ED Medications  insulin aspart (novoLOG) injection 10 Units (not administered)  insulin regular (NOVOLIN R,HUMULIN R) 100 Units in sodium chloride 0.9 % 100 mL (1 Units/mL) infusion (5.4 Units/hr Intravenous New Bag/Given 03/08/17 1644)  promethazine (PHENERGAN) injection 25 mg (not administered)  0.9 %  sodium chloride infusion (not administered)  ondansetron (ZOFRAN) injection 4 mg (4 mg Intravenous Given 03/08/17 1447)  gi cocktail (Maalox,Lidocaine,Donnatal) (30 mLs Oral Given 03/08/17 1447)  sodium chloride 0.9 % bolus 1,000 mL (0 mLs Intravenous Stopped 03/08/17 1541)  sodium chloride 0.9 % bolus 1,000 mL (1,000 mLs Intravenous New Bag/Given 03/08/17 1610)  sodium chloride 0.9 % bolus 1,000 mL (1,000 mLs Intravenous New Bag/Given 03/08/17 1646)    CRITICAL CARE Performed by: Ranae Palms, Dawson Albers Total critical care time: 30 minutes Critical  care time was exclusive of separately billable procedures and treating other patients. Critical care was necessary to treat or prevent imminent or life-threatening deterioration. Critical care was time spent personally by me on the following activities: development of treatment plan with patient and/or surrogate as well as nursing, discussions with consultants, evaluation of patient's response to treatment, examination of patient, obtaining history from patient or surrogate, ordering and performing treatments and interventions, ordering and review of laboratory studies, ordering and review of radiographic studies, pulse oximetry and re-evaluation of patient's condition. Initial Impression / Assessment and Plan / ED Course  I have reviewed the triage vital signs and the nursing notes.  Pertinent labs & imaging results that were available during my care of the patient were reviewed by me and considered in my medical decision making (see chart for details).    Started on insulin drip and IV fluids. Recent CT abdomen without acute abnormality. Will get a noncontrasted CT to rule out any type of obstruction or evidence of perforation. Discussed with hospitalist will see patient in the emergency department and admit to step down bed.   Final Clinical Impressions(s) / ED Diagnoses   Final diagnoses:  Diabetic ketoacidosis without coma associated with diabetes mellitus due to underlying condition (HCC)  AKI (acute kidney injury) [facility])    New Prescriptions New Prescriptions   No medications on file     Loren Racer, MD 03/08/17 [phone]

## 2017-03-08 NOTE — ED Notes (Signed)
HOSPITALIST Provider at bedside. 

## 2017-03-08 NOTE — ED Notes (Signed)
Bed: [facility] Expected date:  Expected time:  Means of arrival:  Comments: 

## 2017-03-08 NOTE — ED Triage Notes (Signed)
Per EMS-states he has been out of his insulin for 2 days-states he recently moved from [place] 2 weeks ago-states generalized abdominal pain and leg weakness

## 2017-03-08 NOTE — ED Notes (Signed)
Patient transported to CT 

## 2017-03-08 NOTE — ED Notes (Signed)
RN notified of abnormal lab 

## 2017-03-09 LAB — GLUCOSE, CAPILLARY
GLUCOSE-CAPILLARY: 119 mg/dL — AB (ref 65–99)
GLUCOSE-CAPILLARY: 175 mg/dL — AB (ref 65–99)
GLUCOSE-CAPILLARY: 206 mg/dL — AB (ref 65–99)
GLUCOSE-CAPILLARY: 210 mg/dL — AB (ref 65–99)
GLUCOSE-CAPILLARY: 215 mg/dL — AB (ref 65–99)
GLUCOSE-CAPILLARY: 296 mg/dL — AB (ref 65–99)
GLUCOSE-CAPILLARY: 87 mg/dL (ref 65–99)
Glucose-Capillary: 109 mg/dL — ABNORMAL HIGH (ref 65–99)
Glucose-Capillary: 59 mg/dL — ABNORMAL LOW (ref 65–99)
Glucose-Capillary: 59 mg/dL — ABNORMAL LOW (ref 65–99)
Glucose-Capillary: 91 mg/dL (ref 65–99)
Glucose-Capillary: 171 mg/dL — ABNORMAL HIGH (ref 65–99)
Glucose-Capillary: 280 mg/dL — ABNORMAL HIGH (ref 65–99)
Glucose-Capillary: 54 mg/dL — ABNORMAL LOW (ref 65–99)
Glucose-Capillary: 192 mg/dL — ABNORMAL HIGH (ref 65–99)
Glucose-Capillary: 47 mg/dL — ABNORMAL LOW (ref 65–99)

## 2017-03-09 LAB — MRSA PCR SCREENING: MRSA by PCR: NEGATIVE

## 2017-03-09 LAB — BASIC METABOLIC PANEL
Anion gap: 9 (ref 5–15)
Anion gap: 12 (ref 5–15)
BUN: 27 mg/dL — AB (ref 6–20)
BUN: 26 mg/dL — AB (ref 6–20)
CALCIUM: 8.4 mg/dL — AB (ref 8.9–10.3)
CO2: 25 mmol/L (ref 22–32)
CO2: 23 mmol/L (ref 22–32)
CREATININE: 1.24 mg/dL (ref 0.61–1.24)
Calcium: 8.4 mg/dL — ABNORMAL LOW (ref 8.9–10.3)
Chloride: 103 mmol/L (ref 101–111)
Chloride: 100 mmol/L — ABNORMAL LOW (ref 101–111)
Creatinine, Ser: 1.18 mg/dL (ref 0.61–1.24)
GFR calc Af Amer: >60 mL/min (ref >60)
GFR calc Af Amer: >60 mL/min (ref >60)
GLUCOSE: 104 mg/dL — AB (ref 65–99)
GLUCOSE: 249 mg/dL — AB (ref 65–99)
POTASSIUM: 3.9 mmol/L (ref 3.5–5.1)
POTASSIUM: 4 mmol/L (ref 3.5–5.1)
Sodium: 137 mmol/L (ref 135–145)
Sodium: 135 mmol/L (ref 135–145)

## 2017-03-09 LAB — HEMOGLOBIN A1C
Hgb A1c MFr Bld: 12.9 % — ABNORMAL HIGH (ref 4.8–5.6)
Mean Plasma Glucose: 323.53 mg/dL

## 2017-03-09 MED ORDER — GABAPENTIN 100 MG PO CAPS
100.0000 mg | ORAL_CAPSULE | Freq: Three times a day (TID) | ORAL | Status: DC
  Administered 2017-03-09 (×2): 100 mg via ORAL
  Filled 2017-03-09 (×2): qty 1

## 2017-03-09 MED ORDER — INSULIN ASPART 100 UNIT/ML ~~LOC~~ SOLN
0.0000 [IU] | Freq: Every day | SUBCUTANEOUS | Status: DC
  Administered 2017-03-11: 22:00:00 3 [IU] via SUBCUTANEOUS

## 2017-03-09 MED ORDER — INSULIN ASPART 100 UNIT/ML ~~LOC~~ SOLN
0.0000 [IU] | Freq: Three times a day (TID) | SUBCUTANEOUS | Status: DC
  Administered 2017-03-09: 8 [IU] via SUBCUTANEOUS
  Administered 2017-03-10: 3 [IU] via SUBCUTANEOUS
  Administered 2017-03-10: 11 [IU] via SUBCUTANEOUS
  Administered 2017-03-10: 08:00:00 8 [IU] via SUBCUTANEOUS
  Administered 2017-03-11: 09:00:00 11 [IU] via SUBCUTANEOUS
  Administered 2017-03-11: 3 [IU] via SUBCUTANEOUS
  Administered 2017-03-11: 15 [IU] via SUBCUTANEOUS
  Administered 2017-03-12: 8 [IU] via SUBCUTANEOUS
  Administered 2017-03-12: 08:00:00 11 [IU] via SUBCUTANEOUS
  Filled 2017-03-09: qty 1

## 2017-03-09 MED ORDER — INSULIN ASPART PROT & ASPART (70-30 MIX) 100 UNIT/ML ~~LOC~~ SUSP
20.0000 [IU] | Freq: Once | SUBCUTANEOUS | Status: AC
  Administered 2017-03-09: 20 [IU] via SUBCUTANEOUS
  Filled 2017-03-09: qty 10

## 2017-03-09 MED ORDER — DEXTROSE 50 % IV SOLN
16.0000 mL | Freq: Once | INTRAVENOUS | Status: AC
  Administered 2017-03-09: 16 mL via INTRAVENOUS

## 2017-03-09 MED ORDER — DEXTROSE 50 % IV SOLN: 1.0000 | Freq: Once | INTRAVENOUS | Status: AC

## 2017-03-09 MED ORDER — SODIUM CHLORIDE 0.9 % IV SOLN
INTRAVENOUS | Status: DC
  Administered 2017-03-09 – 2017-03-10 (×4): via INTRAVENOUS
  Administered 2017-03-11: 50 mL/h via INTRAVENOUS
  Administered 2017-03-12: 07:00:00 via INTRAVENOUS

## 2017-03-09 MED ORDER — PROMETHAZINE HCL 25 MG/ML IJ SOLN: 6.2500 mg | Freq: Three times a day (TID) | INTRAMUSCULAR | Status: DC | PRN

## 2017-03-09 MED ORDER — DEXTROSE 50 % IV SOLN
INTRAVENOUS | Status: AC
  Administered 2017-03-09: 50 mL
  Filled 2017-03-09: qty 50

## 2017-03-09 MED ORDER — GABAPENTIN 100 MG PO CAPS
200.0000 mg | ORAL_CAPSULE | Freq: Three times a day (TID) | ORAL | Status: DC
  Administered 2017-03-10 – 2017-03-12 (×8): 200 mg via ORAL
  Filled 2017-03-09 (×9): qty 2

## 2017-03-09 MED ORDER — DEXTROSE 50 % IV SOLN
INTRAVENOUS | Status: AC
  Filled 2017-03-09: qty 50

## 2017-03-09 MED ORDER — INSULIN ASPART PROT & ASPART (70-30 MIX) 100 UNIT/ML ~~LOC~~ SUSP
20.0000 [IU] | Freq: Two times a day (BID) | SUBCUTANEOUS | Status: DC
  Administered 2017-03-09: 20 [IU] via SUBCUTANEOUS

## 2017-03-09 NOTE — Progress Notes (Signed)
Patient report given to [facility] Nurse Ladona Ridgel. Voiced no questions or concerns. Will transfer soon. Will continue to monitor.

## 2017-03-09 NOTE — Progress Notes (Signed)
Hypoglycemic Event  CBG: 47  Treatment:1 amp d50 given   Symptoms: None. Patient remains alert and orientedX4. Currently eating graham crackers and peanut butter.   Follow-up CBG: Time: 1300 CBG Result:194  Possible Reasons for Event: DKA admission  Comments/MD notified:Dr. Gwenlyn Perking. No new orders.      Dorma Russell

## 2017-03-09 NOTE — Progress Notes (Signed)
Hypoglycemic Event  CBG: 54  Treatment: Orange Juice with sugar Symptoms:   Follow-up CBG: Time:1230 CBG Result:47   Possible Reasons for Event: Admission for DKA. Drip has been off since 0950 this am. Has drank soda, ate applesauce and crackers. Patient is alert and orientedX4 with no signs or symptoms. Will continue to monitor.   Comments/MD notified:Dr. Gwenlyn Perking. No new orders    Matthew Riley

## 2017-03-09 NOTE — Progress Notes (Signed)
[facility] PROGRESS NOTE  Knight Merklin WKG:[id number] DOB: 1975-10-31 DOA: 03/08/2017 PCP: Patient, No Pcp Per  Interim summary and HPI 41 y/o male with PMH significant for HTN, type 1 diabetes with renal disease and prior hx of CAD status post stenting; who relocated from [place] couple months ago and for the last 4 weeks or so has been w/o insulin treatment. Patient presented with 3-4 days of generalized weakness and tiredness, positive nausea, vomiting, abd pain and difficulty keeping anything down. Patient denies CP, SOB, hematuria, dysuria, HA;s or any other complaints.   Assessment/Plan:  Type 1 diabetes with DKA: most likely from medication no complaince -troponin neg -DKA resolved -will make transition to SSI and 70/30 -follow CBG and adjust hypoglycemic regimen  -follow A1C  AKI -much improved with IVF's -most likely due to dehydration/pre-renal azotemia -will continue Minimizing/avoidinge use of nephrotoxic agents  -follow BMET in am  N/V and GERD continue PPI -no further nausea or vomiting -will monitor and use PRN antiemetics.  Hyponatremia/hyperkalemia -pseudo-hyponatremia -associated with dehydration and high CBG's -improved/resolved with IVF's and correction of hyperglycemia. -will follow electrolytes  LLE weakness -after completing proper IVF's resuscitation and electrolytes repletion, will have PT evaluating patient  -patient with recent CT and MRI; no major abnormalities seen -possible component of neuropathy from diabetes. -will add neurontin  Code Status: Full Family Communication: no family at bedside  Disposition Plan: will transition to SSI and 70/30; advance diet and follow CBG's. Will also ask PT to evaluate patient given LLE weakness. Transfer to med-surg bed.   Consultants:  none  Procedures:  See below for x-ray reports  Antibiotics:  None   HPI/Subjective: No CP, no nausea, no vomiting, no fever. Patient reported  improvement/resolution of abd pain. DKA resolved. CBG still uncontrolled.  Objective: Vitals:   03/09/17 0400 03/09/17 0800  BP: 111/68 (!) 144/104  Pulse: 79 86  Resp: 12 12  Temp:  97.9 F (36.6 C)  SpO2: 99% 99%    Intake/Output Summary (Last 24 hours) at 03/09/17 0913 Last data filed at 03/09/17 0819  Gross per 24 hour  Intake          8319.27 ml  Output              402 ml  Net          7917.27 ml   Filed Weights   03/08/17 1618 03/08/17 2341  Weight: 90.7 kg (200 lb) 82.4 kg (181 lb 10.5 oz)    Exam:   General:  Afebrile, no CP, no SOB, no nausea, no vomiting. reporting LLE weakness   Cardiovascular: S1 and S2, no rubs, no gallops  Respiratory: CTA bilaterally   Abdomen: soft, NT, ND, positive BS  Musculoskeletal: no edema, no cyanosis; MS 4/5 LE due to poor effort.  Data Reviewed: Basic Metabolic Panel:  Recent Labs Lab 03/08/17 1450 03/08/17 2050 03/08/17 2313 03/09/17 0330 03/09/17 0703  NA 126* 135 135 137 135  K 6.3* 4.2 3.8 3.9 4.0  CL 87* 99* 101 103 100*  CO2 12* 16* 20* 25 23  GLUCOSE 676* 363* 217* 104* 249*  BUN 25* 29* 28* 27* 26*  CREATININE 1.76* 1.68* 1.57* 1.24 1.18  CALCIUM 9.6 8.8* 8.6* 8.4* 8.4*  MG  --  1.9  --   --   --   PHOS  --  4.2  --   --   --    Liver Function Tests:  Recent Labs Lab 03/08/17 1450  AST 153*  ALT 159*  ALKPHOS 122  BILITOT 2.3*  PROT 9.4*  ALBUMIN 3.6    Recent Labs Lab 03/08/17 1450  LIPASE 27   CBC:  Recent Labs Lab 03/08/17 1450 03/08/17 2050  WBC 11.9* 14.6*  HGB 14.9 13.1  HCT 42.8 37.6*  MCV 89.9 87.4  PLT 302 253   BNP (last 3 results)  Recent Labs  03/08/17 1450  BNP 55.1   CBG:  Recent Labs Lab 03/09/17 0245 03/09/17 0350 03/09/17 0450 03/09/17 0554 03/09/17 0653  GLUCAP 91 119* 171* 215* 280*    Recent Results (from the past 240 hour(s))  MRSA PCR Screening     Status: None   Collection Time: 03/08/17 10:42 PM  Result Value Ref Range Status   MRSA  by PCR NEGATIVE NEGATIVE Final    Comment:        The GeneXpert MRSA Assay (FDA approved for NASAL specimens only), is one component of a comprehensive MRSA colonization surveillance program. It is not intended to diagnose MRSA infection nor to guide or monitor treatment for MRSA infections.      Studies: Ct Abdomen Pelvis Wo Contrast  Result Date: 03/08/2017 CLINICAL DATA:  Generalized abdominal pain. EXAM: CT ABDOMEN AND PELVIS WITHOUT CONTRAST TECHNIQUE: Multidetector CT imaging of the abdomen and pelvis was performed following the standard protocol without IV contrast. COMPARISON:  CT abdomen pelvis dated March 01, 2017. FINDINGS: Lower chest: No acute abnormality. Hepatobiliary: No focal liver abnormality is seen. No gallstones, gallbladder wall thickening, or biliary dilatation. Pancreas: Unremarkable. No pancreatic ductal dilatation or surrounding inflammatory changes. Spleen: Normal in size without focal abnormality. Adrenals/Urinary Tract: Adrenal glands are unremarkable. Mild fullness of the left greater than right renal collecting systems. No renal calculi. No hydroureter. Bladder is unremarkable. Stomach/Bowel: Stomach is within normal limits. Appendix is not well seen, however there are no secondary signs of inflammation in the right lower quadrant. No evidence of bowel wall thickening, distention, or inflammatory changes. Prominent stool throughout the colon. Vascular/Lymphatic: Minimal aortic atherosclerosis. No enlarged abdominal or pelvic lymph nodes. Reproductive: Prostate is unremarkable. Other: Mild mesenteric edema.  No pneumoperitoneum or free fluid. Musculoskeletal: No acute or significant osseous findings. IMPRESSION: 1. Limited evaluation without intravenous contrast. No definite acute intra-abdominal process. Mild fullness of the left greater than right renal collecting systems without definite evidence of obstruction. 2. Mild mesenteric and generalized subcutaneous  edema. 3.  Prominent stool throughout the colon favors constipation. Electronically Signed   By: Obie Dredge M.D.   On: 03/08/2017 17:55   Dg Chest 2 View  Result Date: 03/08/2017 CLINICAL DATA:  Shortness of breath. EXAM: CHEST  2 VIEW COMPARISON:  None. FINDINGS: The heart size and mediastinal contours are within normal limits. Both lungs are clear. The visualized skeletal structures are unremarkable. IMPRESSION: Normal chest x-ray. Electronically Signed   By: Obie Dredge M.D.   On: 03/08/2017 13:32    Scheduled Meds: . gabapentin  100 mg Oral TID  . heparin  5,000 Units Subcutaneous Q8H  . insulin aspart  0-15 Units Subcutaneous TID WC  . insulin aspart  0-5 Units Subcutaneous QHS  . insulin aspart protamine- aspart  20 Units Subcutaneous BID WC  . pantoprazole (PROTONIX) IV  40 mg Intravenous Q24H   Continuous Infusions: . sodium chloride 100 mL/hr at 03/09/17 0902  . insulin (NOVOLIN-R) infusion 4.5 Units/hr (03/09/17 0819)    Principal Problem:   DKA, type 1 (HCC) Active Problems:   Nausea & vomiting   GERD (gastroesophageal  reflux disease)   Hyponatremia   AKI (acute kidney injury) (HCC)   Hyperkalemia    Time spent: 30 minutes    Vassie Loll  [facility] Pager [phone] If 7PM-7AM, please contact night-coverage at [email or web address], password [facility] 03/09/2017, 9:13 AM  LOS: 1 day

## 2017-03-09 NOTE — Progress Notes (Signed)
Updated Ladona Ridgel RN about patient condition and interventions. Dr. Gwenlyn Perking cleared for transfer. Will transfer at this time.

## 2017-03-10 ENCOUNTER — Encounter (HOSPITAL_COMMUNITY): Payer: Self-pay | Admitting: *Deleted

## 2017-03-10 LAB — BASIC METABOLIC PANEL
ANION GAP: 6 (ref 5–15)
BUN: 15 mg/dL (ref 6–20)
CO2: 27 mmol/L (ref 22–32)
Calcium: 8.5 mg/dL — ABNORMAL LOW (ref 8.9–10.3)
Chloride: 101 mmol/L (ref 101–111)
Creatinine, Ser: 0.84 mg/dL (ref 0.61–1.24)
GFR calc Af Amer: >60 mL/min (ref >60)
GLUCOSE: 190 mg/dL — AB (ref 65–99)
POTASSIUM: 3.5 mmol/L (ref 3.5–5.1)
Sodium: 134 mmol/L — ABNORMAL LOW (ref 135–145)

## 2017-03-10 LAB — GLUCOSE, CAPILLARY
GLUCOSE-CAPILLARY: 154 mg/dL — AB (ref 65–99)
GLUCOSE-CAPILLARY: 278 mg/dL — AB (ref 65–99)
GLUCOSE-CAPILLARY: 308 mg/dL — AB (ref 65–99)
GLUCOSE-CAPILLARY: 154 mg/dL — AB (ref 65–99)
GLUCOSE-CAPILLARY: 156 mg/dL — AB (ref 65–99)
Glucose-Capillary: 22 mg/dL — CL (ref 65–99)
Glucose-Capillary: 17 mg/dL — CL (ref 65–99)
Glucose-Capillary: 259 mg/dL — ABNORMAL HIGH (ref 65–99)

## 2017-03-10 MED ORDER — DEXTROSE 50 % IV SOLN
INTRAVENOUS | Status: AC
  Administered 2017-03-10: 50 mL
  Filled 2017-03-10: qty 50

## 2017-03-10 MED ORDER — METHOCARBAMOL 1000 MG/10ML IJ SOLN
500.0000 mg | Freq: Three times a day (TID) | INTRAMUSCULAR | Status: DC | PRN
  Administered 2017-03-11: 13:00:00 500 mg via INTRAVENOUS
  Filled 2017-03-10: qty 5
  Filled 2017-03-10: qty 550

## 2017-03-10 MED ORDER — INSULIN ASPART PROT & ASPART (70-30 MIX) 100 UNIT/ML ~~LOC~~ SUSP
25.0000 [IU] | Freq: Two times a day (BID) | SUBCUTANEOUS | Status: DC
  Administered 2017-03-10 (×2): 25 [IU] via SUBCUTANEOUS

## 2017-03-10 NOTE — Evaluation (Signed)
Physical Therapy Evaluation Patient Details Name: Matthew Riley MRN: [id number] DOB: Apr 03, 1976 Today's Date: 03/10/2017   History of Present Illness  [name] is a 41 y/o male with PMH significant for HTN, type 1 diabetes with renal disease and prior hx of CAD status post stenting; stroke. Moved from [name]  and has been  been w/o insulin treatment. Patient presented 03/08/17 with 3-4 days of generalized weakness and tiredness, positive nausea, vomiting, abd pain , patient i DKA  Clinical Impression  The patient presents with significant  Weakness of the left leg, impaired sensation of both lower legs. The patient lives with sister in 2 level home which patient reports much difficulty managing steps. Pt admitted with above diagnosis. Pt currently with functional limitations due to the deficits listed below (see PT Problem List). Pt will benefit from skilled PT to increase their independence and safety with mobility to allow discharge to the venue listed below.       Follow Up Recommendations Home health PT    Equipment Recommendations  Rolling walker with 5" wheels;Crutches    Recommendations for Other Services OT consult     Precautions / Restrictions Precautions Precautions: Fall Precaution Comments: left leg is weak      Mobility  Bed Mobility Overal bed mobility: Needs Assistance Bed Mobility: Supine to Sit   Sidelying to sit: Min assist       General bed mobility comments: assist with left leg  Transfers Overall transfer level: Needs assistance Equipment used: Rolling walker (2 wheeled) Transfers: Sit to/from Stand Sit to Stand: Min assist;From elevated surface         General transfer comment: steady assist at [facility]  Ambulation/Gait Ambulation/Gait assistance: Mod assist Ambulation Distance (Feet): 4 Feet Assistive device: Rolling walker (2 wheeled) Gait Pattern/deviations: Step-to pattern;Antalgic;Decreased step length - left;Decreased stance time -  left;Decreased dorsiflexion - left     General Gait Details: antalgic on LLE and noted decreased DF, steady assist for balance  Stairs            Wheelchair Mobility    Modified Rankin (Stroke Patients Only)       Balance                                             Pertinent Vitals/Pain Pain Assessment: Faces Faces Pain Scale: Hurts whole lot Pain Location: left leg Pain Descriptors / Indicators: Burning;Contraction;Cramping Pain Intervention(s): Limited activity within patient's tolerance;Monitored during session;Repositioned    Home Living Family/patient expects to be discharged to:: Private residence Living Arrangements: Alone;Other relatives Available Help at Discharge: Family Type of Home: House Home Access: Level entry     Home Layout: Two level Home Equipment: None      Prior Function Level of Independence: Independent         Comments: has had falls, reports much difficulty on stairs     Hand Dominance        Extremity/Trunk Assessment   Upper Extremity Assessment Upper Extremity Assessment: RUE deficits/detail;LUE deficits/detail RUE Deficits / Details: reports fingers numb RUE Sensation: history of peripheral neuropathy LUE: Unable to fully assess due to immobilization    Lower Extremity Assessment Lower Extremity Assessment: LLE deficits/detail;RLE deficits/detail RLE Deficits / Details: grossly 5/5  RLE Sensation: history of peripheral neuropathy LLE Deficits / Details: knee extension 3+, hip flexion 3, dorsiflexion 2+ LLE Sensation: history of peripheral neuropathy  Cervical / Trunk Assessment Cervical / Trunk Assessment: Normal  Communication   Communication: No difficulties  Cognition Arousal/Alertness: Awake/alert Behavior During Therapy: WFL for tasks assessed/performed Overall Cognitive Status: Within Functional Limits for tasks assessed                                         General Comments      Exercises     Assessment/Plan    PT Assessment Patient needs continued PT services  PT Problem List Decreased strength;Decreased range of motion;Decreased activity tolerance;Decreased balance;Decreased mobility;Decreased knowledge of precautions;Decreased safety awareness;Decreased knowledge of use of DME;Impaired sensation;Pain       PT Treatment Interventions DME instruction;Gait training;Stair training;Functional mobility training;Therapeutic activities;Therapeutic exercise;Patient/family education    PT Goals (Current goals can be found in the Care Plan section)  Acute Rehab PT Goals Patient Stated Goal: to walk without pain PT Goal Formulation: With patient Time For Goal Achievement: 03/24/17 Potential to Achieve Goals: Fair    Frequency Min 3X/week   Barriers to discharge Decreased caregiver support;Inaccessible home environment      Co-evaluation               AM-PAC PT "6 Clicks" Daily Activity  Outcome Measure Difficulty turning over in bed (including adjusting bedclothes, sheets and blankets)?: A Little Difficulty moving from lying on back to sitting on the side of the bed? : A Little Difficulty sitting down on and standing up from a chair with arms (e.g., wheelchair, bedside commode, etc,.)?: Unable Help needed moving to and from a bed to chair (including a wheelchair)?: Total Help needed walking in hospital room?: Total Help needed climbing 3-5 steps with a railing? : Total 6 Click Score: 10    End of Session Equipment Utilized During Treatment: Gait belt Activity Tolerance: Patient tolerated treatment well Patient left: in chair;with call bell/phone within reach;with chair alarm set Nurse Communication: Mobility status PT Visit Diagnosis: Unsteadiness on feet (R26.81);Difficulty in walking, not elsewhere classified (R26.2);History of falling (Z91.81)    Time: 1126-1202 PT Time Calculation (min) (ACUTE ONLY): 36  min   Charges:   PT Evaluation $PT Eval Low Complexity: 1 Low PT Treatments $Gait Training: 8-22 mins   PT G Codes:        {Hollace Michelli PT [phone]   Rada Hay 03/10/2017, 12:27 PM

## 2017-03-10 NOTE — Progress Notes (Signed)
[facility] PROGRESS NOTE  Trystan Akhtar KYH:[id number] DOB: 01/11/1976 DOA: 03/08/2017 PCP: Patient, No Pcp Per  Interim summary and HPI 41 y/o male with PMH significant for HTN, type 1 diabetes with renal disease and prior hx of CAD status post stenting; who relocated from [place] couple months ago and for the last 4 weeks or so has been w/o insulin treatment. Patient presented with 3-4 days of generalized weakness and tiredness, positive nausea, vomiting, abd pain and difficulty keeping anything down. Patient denies CP, SOB, hematuria, dysuria, HA;s or any other complaints.   Assessment/Plan: Type 1 diabetes with DKA: most likely from medication no compliance  -anion gap and bicarbonate remains stable -CBG's reaching 300 range after diet advance -will continue SSI and adjsut his 70/30 for better control -will require close follow up with PCP for further adjustment on hypoglycemic regimen at discharge -A1C 12.9  AKI -much improved/resolved with IVF's -most likely due to dehydration/pre-renal azotemia -will continue Monitoring trend   N/V and GERD -no further nausea or vomiting  -will continue PPI -continue PRN antiemetics  Hyponatremia/hyperkalemia -pseudo-hyponatremia -associated with dehydration and high CBG's -sodium 134 and K 3.5 -will continue monitoring trend -replete as needed   LLE weakness -after completing proper IVF's resuscitation and electrolytes repletion, patient seen by PT who recommended HH services -patient symptoms most likely associated with physical deconditioning and diabetic neuropathy -will focus efforts on controlling diabetes and will continue use of gabapentin and PRN robaxin -patient with recent CT and MRI; no major abnormalities seen  Code Status: Full Family Communication: no family at bedside  Disposition Plan: will transition to SSI and 70/30; advance diet and follow CBG's. Continue adjustment on hypoglycemic regimen. Will also  discussed with CM for [facility] services (maybe charity approach, patient w/o insurance)  Consultants:  None  Procedures:  See below for x-ray reports  Antibiotics:  None   HPI/Subjective: No CP, no SOB, no further nausea or vomiting. Continue complaining of muscle spasm and LLE numbness/weakness.   Objective: Vitals:   03/10/17 0931 03/10/17 1404  BP: (!) 140/97 (!) 151/108  Pulse: 80 85  Resp: 18 18  Temp: 97.9 F (36.6 C) 98.3 F (36.8 C)  SpO2: 100% 100%    Intake/Output Summary (Last 24 hours) at 03/10/17 1813 Last data filed at 03/10/17 1700  Gross per 24 hour  Intake             2220 ml  Output             3175 ml  Net             -955 ml   Filed Weights   03/08/17 1618 03/08/17 2341  Weight: 90.7 kg (200 lb) 82.4 kg (181 lb 10.5 oz)    Exam:   General:  No fever, no CP, no SOB, no nausea, no vomiting and no abd pain. Feeling better overall. continue to have elevated CBG's and is complaining of LLE weakness and generalized numbness/muscle spasm.   Cardiovascular: RRR, no rubs, no gallops  Respiratory: normal resp effort, CTA bilaterally  Abdomen: soft, NT, ND, positive BS  Musculoskeletal: no edema, no cyanosis. Continue to have MS 4/5 in his LLE and decrease grip. Patient also expressed intermittent diffuse muscle spasm.  Data Reviewed: Basic Metabolic Panel:  Recent Labs Lab 03/08/17 2050 03/08/17 2313 03/09/17 0330 03/09/17 0703 03/10/17 0513  NA 135 135 137 135 134*  K 4.2 3.8 3.9 4.0 3.5  CL 99* 101 103 100* 101  CO2 16*  20* 25 23 27   GLUCOSE 363* 217* 104* 249* 190*  BUN 29* 28* 27* 26* 15  CREATININE 1.68* 1.57* 1.24 1.18 0.84  CALCIUM 8.8* 8.6* 8.4* 8.4* 8.5*  MG 1.9  --   --   --   --   PHOS 4.2  --   --   --   --    Liver Function Tests:  Recent Labs Lab 03/08/17 1450  AST 153*  ALT 159*  ALKPHOS 122  BILITOT 2.3*  PROT 9.4*  ALBUMIN 3.6    Recent Labs Lab 03/08/17 1450  LIPASE 27   CBC:  Recent Labs Lab  03/08/17 1450 03/08/17 2050  WBC 11.9* 14.6*  HGB 14.9 13.1  HCT 42.8 37.6*  MCV 89.9 87.4  PLT 302 253   BNP (last 3 results)  Recent Labs  03/08/17 1450  BNP 55.1   CBG:  Recent Labs Lab 03/09/17 2233 03/10/17 0444 03/10/17 0726 03/10/17 1330 03/10/17 1655  GLUCAP 175* 154* 278* 308* 154*    Recent Results (from the past 240 hour(s))  MRSA PCR Screening     Status: None   Collection Time: 03/08/17 10:42 PM  Result Value Ref Range Status   MRSA by PCR NEGATIVE NEGATIVE Final    Comment:        The GeneXpert MRSA Assay (FDA approved for NASAL specimens only), is one component of a comprehensive MRSA colonization surveillance program. It is not intended to diagnose MRSA infection nor to guide or monitor treatment for MRSA infections.      Studies: No results found.  Scheduled Meds: . gabapentin  200 mg Oral TID  . heparin  5,000 Units Subcutaneous Q8H  . insulin aspart  0-15 Units Subcutaneous TID WC  . insulin aspart  0-5 Units Subcutaneous QHS  . insulin aspart protamine- aspart  25 Units Subcutaneous BID WC  . pantoprazole (PROTONIX) IV  40 mg Intravenous Q24H   Continuous Infusions: . sodium chloride 75 mL/hr at 03/10/17 1441  . methocarbamol (ROBAXIN)  IV      Principal Problem:   DKA, type 1 (HCC) Active Problems:   Nausea & vomiting   GERD (gastroesophageal reflux disease)   Hyponatremia   AKI (acute kidney injury) (HCC)   Hyperkalemia    Time spent: 30 minutes    05/10/17  [facility] Pager [phone] If 7PM-7AM, please contact night-coverage at [email or web address], password [facility] 03/10/2017, 6:13 PM  LOS: 2 days

## 2017-03-11 ENCOUNTER — Encounter (HOSPITAL_COMMUNITY): Payer: Self-pay | Admitting: Orthopedic Surgery

## 2017-03-11 ENCOUNTER — Telehealth: Payer: Self-pay

## 2017-03-11 DIAGNOSIS — E160 Drug-induced hypoglycemia without coma: Secondary | ICD-10-CM

## 2017-03-11 DIAGNOSIS — T383X5A Adverse effect of insulin and oral hypoglycemic [antidiabetic] drugs, initial encounter: Secondary | ICD-10-CM

## 2017-03-11 LAB — BASIC METABOLIC PANEL
ANION GAP: 9 (ref 5–15)
BUN: 12 mg/dL (ref 6–20)
CALCIUM: 8.6 mg/dL — AB (ref 8.9–10.3)
CO2: 28 mmol/L (ref 22–32)
CREATININE: 0.77 mg/dL (ref 0.61–1.24)
Chloride: 93 mmol/L — ABNORMAL LOW (ref 101–111)
GFR calc Af Amer: >60 mL/min (ref >60)
GLUCOSE: 293 mg/dL — AB (ref 65–99)
Potassium: 3.7 mmol/L (ref 3.5–5.1)
Sodium: 130 mmol/L — ABNORMAL LOW (ref 135–145)

## 2017-03-11 LAB — CBC
HEMATOCRIT: 34.9 % — AB (ref 39.0–52.0)
HEMOGLOBIN: 12.2 g/dL — AB (ref 13.0–17.0)
MCH: 30.3 pg (ref 26.0–34.0)
MCHC: 35 g/dL (ref 30.0–36.0)
MCV: 86.6 fL (ref 78.0–100.0)
Platelets: 191 10*3/uL (ref 150–400)
RBC: 4.03 MIL/uL — AB (ref 4.22–5.81)
RDW: 14 % (ref 11.5–15.5)
WBC: 4.7 10*3/uL (ref 4.0–10.5)

## 2017-03-11 LAB — GLUCOSE, CAPILLARY
GLUCOSE-CAPILLARY: 253 mg/dL — AB (ref 65–99)
GLUCOSE-CAPILLARY: 377 mg/dL — AB (ref 65–99)
GLUCOSE-CAPILLARY: 265 mg/dL — AB (ref 65–99)
Glucose-Capillary: 288 mg/dL — ABNORMAL HIGH (ref 65–99)
Glucose-Capillary: 335 mg/dL — ABNORMAL HIGH (ref 65–99)
Glucose-Capillary: 192 mg/dL — ABNORMAL HIGH (ref 65–99)

## 2017-03-11 LAB — TSH: TSH: 0.799 u[IU]/mL (ref 0.350–4.500)

## 2017-03-11 MED ORDER — INSULIN ASPART PROT & ASPART (70-30 MIX) 100 UNIT/ML ~~LOC~~ SUSP
35.0000 [IU] | Freq: Two times a day (BID) | SUBCUTANEOUS | Status: DC
  Administered 2017-03-11 – 2017-03-12 (×3): 35 [IU] via SUBCUTANEOUS

## 2017-03-11 MED ORDER — LISINOPRIL 20 MG PO TABS
20.0000 mg | ORAL_TABLET | Freq: Every day | ORAL | Status: DC
  Administered 2017-03-11 – 2017-03-12 (×2): 20 mg via ORAL
  Filled 2017-03-11 (×2): qty 1

## 2017-03-11 MED ORDER — PANTOPRAZOLE SODIUM 40 MG PO TBEC
40.0000 mg | DELAYED_RELEASE_TABLET | Freq: Every day | ORAL | Status: DC
  Administered 2017-03-11 – 2017-03-12 (×2): 40 mg via ORAL
  Filled 2017-03-11 (×2): qty 1

## 2017-03-11 MED ORDER — ACETAMINOPHEN 500 MG PO TABS
1000.0000 mg | ORAL_TABLET | Freq: Three times a day (TID) | ORAL | Status: DC | PRN
  Administered 2017-03-12 (×2): 1000 mg via ORAL
  Filled 2017-03-11 (×2): qty 2

## 2017-03-11 NOTE — Progress Notes (Signed)
Hypoglycemic Event  CBG17  Treatment: D50 IV 50 mL  Symptoms: Sweaty  Follow-up CBG: Time:2116 CBG Result156  Possible Reasons for Event: Medication regimen: medication regimen  Comments/MD notified:no    Matthew Riley, Matthew Riley

## 2017-03-11 NOTE — Progress Notes (Signed)
[facility] PROGRESS NOTE  Matthew Riley YBO:[id number] DOB: 11/22/1975 DOA: 03/08/2017 PCP: Patient, No Pcp Per  Interim summary and HPI 41 y/o male with PMH significant for HTN, type 1 diabetes with renal disease and prior hx of CAD status post stenting; who relocated from [place] couple months ago and for the last 4 weeks or so has been w/o insulin treatment. Patient presented with 3-4 days of generalized weakness and tiredness, positive nausea, vomiting, abd pain and difficulty keeping anything down. Patient denies CP, SOB, hematuria, dysuria, HA;s or any other complaints.   Assessment/Plan: Type 1 diabetes with DKA: most likely from medication no compliance. -anion gap and bicarbonate has remained stable -CBG's in the 300 range after diet advance; also experienced hypoglycemic event. -will continue SSI and adjsut his 70/30 as needed for better control -will require close follow up with PCP for further adjustment on hypoglycemic regimen at discharge -A1C 12.9  AKI -much improved/resolved with IVF's -most likely due to dehydration/pre-renal azotemia -will continue Monitoring trend   N/V and GERD -no further nausea or vomiting  -will continue PPI -continue PRN antiemetics  Hyponatremia/hyperkalemia -pseudo-hyponatremia -associated with dehydration and high CBG's -will continue monitoring trend -continue repletion as needed   LLE weakness -after completing proper IVF's resuscitation and electrolytes repletion, patient seen by PT who recommended HH services -patient symptoms most likely associated with physical deconditioning and diabetic neuropathy -will focus efforts on controlling diabetes and will continue use of gabapentin and PRN robaxin -patient with recent CT and MRI; no major abnormalities seen  Code Status: Full Family Communication: no family at bedside  Disposition Plan: will continue adjustment on insulin therapy to prevent further lows if possible and to  have better CBG's control. Advise not to skip diet. Discussed with CM for [facility] services and outpatient follow up.   Consultants:  None  Procedures:  See below for x-ray reports  Antibiotics:  None   HPI/Subjective: No CP, no SOB, no nausea, no vomiting. Patient with significant hypoglycemic event overnight.  Objective: Vitals:   03/11/17 1143 03/11/17 1424  BP: (!) 135/105 131/83  Pulse: 88 80  Resp: 16 16  Temp: 98.3 F (36.8 C) 98.6 F (37 C)  SpO2:  100%    Intake/Output Summary (Last 24 hours) at 03/11/17 1929 Last data filed at 03/11/17 1754  Gross per 24 hour  Intake          2172.08 ml  Output             3602 ml  Net         -1429.92 ml   Filed Weights   03/08/17 1618 03/08/17 2341  Weight: 90.7 kg (200 lb) 82.4 kg (181 lb 10.5 oz)    Exam:   General:  No fever, no CP, no SOB. Reports no nausea or vomiting. Overnight with significant life threatening hypoglycemic episode. Reported improvement in his muscle spasm.   Rest of physical findings unchanged from examination on 10/7; see below for details:   Cardiovascular: RRR, no rubs, no gallops  Respiratory: normal resp effort, CTA bilaterally  Abdomen: soft, NT, ND, positive BS  Musculoskeletal: no edema, no cyanosis. Continue to have MS 4/5 in his LLE and decrease grip. Patient also expressed intermittent diffuse muscle spasm.  Data Reviewed: Basic Metabolic Panel:  Recent Labs Lab 03/08/17 2050 03/08/17 2313 03/09/17 0330 03/09/17 0703 03/10/17 0513 03/11/17 0812  NA 135 135 137 135 134* 130*  K 4.2 3.8 3.9 4.0 3.5 3.7  CL 99* 101  103 100* 101 93*  CO2 16* 20* 25 23 27 28   GLUCOSE 363* 217* 104* 249* 190* 293*  BUN 29* 28* 27* 26* 15 12  CREATININE 1.68* 1.57* 1.24 1.18 0.84 0.77  CALCIUM 8.8* 8.6* 8.4* 8.4* 8.5* 8.6*  MG 1.9  --   --   --   --   --   PHOS 4.2  --   --   --   --   --    Liver Function Tests:  Recent Labs Lab 03/08/17 1450  AST 153*  ALT 159*  ALKPHOS 122   BILITOT 2.3*  PROT 9.4*  ALBUMIN 3.6    Recent Labs Lab 03/08/17 1450  LIPASE 27   CBC:  Recent Labs Lab 03/08/17 1450 03/08/17 2050 03/11/17 0812  WBC 11.9* 14.6* 4.7  HGB 14.9 13.1 12.2*  HCT 42.8 37.6* 34.9*  MCV 89.9 87.4 86.6  PLT 302 253 191   BNP (last 3 results)  Recent Labs  03/08/17 1450  BNP 55.1   CBG:  Recent Labs Lab 03/10/17 2314 03/11/17 0603 03/11/17 0745 03/11/17 1136 03/11/17 1742  GLUCAP 259* 288* 335* 192* 377*    Recent Results (from the past 240 hour(s))  MRSA PCR Screening     Status: None   Collection Time: 03/08/17 10:42 PM  Result Value Ref Range Status   MRSA by PCR NEGATIVE NEGATIVE Final    Comment:        The GeneXpert MRSA Assay (FDA approved for NASAL specimens only), is one component of a comprehensive MRSA colonization surveillance program. It is not intended to diagnose MRSA infection nor to guide or monitor treatment for MRSA infections.      Studies: No results found.  Scheduled Meds: . gabapentin  200 mg Oral TID  . heparin  5,000 Units Subcutaneous Q8H  . insulin aspart  0-15 Units Subcutaneous TID WC  . insulin aspart  0-5 Units Subcutaneous QHS  . insulin aspart protamine- aspart  35 Units Subcutaneous BID WC  . lisinopril  20 mg Oral Daily  . pantoprazole  40 mg Oral Daily   Continuous Infusions: . sodium chloride 50 mL/hr (03/11/17 0843)  . methocarbamol (ROBAXIN)  IV Stopped (03/11/17 1325)    Principal Problem:   DKA, type 1 (HCC) Active Problems:   Nausea & vomiting   GERD (gastroesophageal reflux disease)   Hyponatremia   AKI (acute kidney injury) (HCC)   Hyperkalemia    Time spent: 30 minutes    05/11/17  [facility] Pager [phone] If 7PM-7AM, please contact night-coverage at [email or web address], password [facility] 03/11/2017, 7:29 PM  LOS: 3 days

## 2017-03-11 NOTE — Progress Notes (Signed)
[facility] rep Clydie Braun able to accept for chairty-HHPT;HH Child psychotherapist.

## 2017-03-11 NOTE — Telephone Encounter (Signed)
Request received from Lanier Clam, RN CM for a hospital follow up appointment for the patient at [facility]. An appointment was scheduled for 03/22/17 @ 0830 and the information was placed on the AVS.

## 2017-03-11 NOTE — Progress Notes (Signed)
Inpatient Diabetes Program Recommendations  AACE/ADA: New Consensus Statement on Inpatient Glycemic Control (2015)  Target Ranges:  Prepandial:   less than 140 mg/dL      Peak postprandial:   less than 180 mg/dL (1-2 hours)      Critically ill patients:  140 - 180 mg/dL   Lab Results  Component Value Date   GLUCAP 192 (H) 03/11/2017   HGBA1C 12.9 (H) 03/08/2017    Review of Glycemic Control  Diabetes history: DM1 Outpatient Diabetes medications: Novolin 70/30 30 units tidwc Current orders for Inpatient glycemic control: 70/30 35 units bid, Novolog 0-15 units tidwc and hs  Spoke with pt regarding his HgbA1C of 12.9%. States he had not taken insulin x 2 days and presented to ED in DKA. Case manager to see regarding obtaining PCP and MATCH letter for 70/30 and Regular insulin. Will send Living Well book and CHO information. Instructed pt regarding importance of eating a regular meal when taking 70/30 insulin. Also stressed importance of getting PCP to manage his diabetes. Instructed on checking blood sugars 3-4x/day and recording in logbook. Take logbook to MD appt. Reviewed hypoglycemia s/s and treatment.  Will observe how pt does on : 70/30 35 units bid and Novolog 0-15 units tidwc and hs  Will f/u in am prior to discharge.  Thank you. Ailene Ards, RD, LDN, CDE Inpatient Diabetes Coordinator [phone]

## 2017-03-11 NOTE — Progress Notes (Signed)
Hypoglycemic Event  CBG: 22 Treatment: 15 GM carbohydrate snack  Symptoms: Sweaty  Follow-up CBG: Time:2044 CBG Result:17  Possible Reasons for Event: Medication regimen: medication regimen  Comments/MD notified:no    Alverda Skeans, Bertram Millard

## 2017-03-11 NOTE — Care Management Note (Signed)
Case Management Note  Patient Details  Name: Luke Falero MRN: [id number] Date of Birth: [birth year]  Subjective/Objective: Per [facility] transitional Community Care liason-able to make f/u hospital pcp visit-see AVS-patient informed to arrive on scheduled day @ appt time 10/19 @ 8:30a.                   Action/Plan:d/c home.   Expected Discharge Date:                  Expected Discharge Plan:  Home/Self Care  In-House Referral:     Discharge planning Services  CM Consult, MATCH Program, [facility]igent [facility]  Post Acute Care Choice:    Choice offered to:     DME Arranged:    DME Agency:     HH Arranged:    HH Agency:     Status of Service:  In process, will continue to follow  If discussed at Long Length of Stay Meetings, dates discussed:    Additional Comments:  Lanier Clam, RN 03/11/2017, 3:08 PM

## 2017-03-11 NOTE — Progress Notes (Signed)
PHARMACIST - PHYSICIAN COMMUNICATION  DR:   Gwenlyn Perking  CONCERNING: IV to Oral Route Change Policy  RECOMMENDATION: This patient is receiving protonix by the intravenous route.  Based on criteria approved by the Pharmacy and Therapeutics Committee, the intravenous medication(s) is/are being converted to the equivalent oral dose form(s).   DESCRIPTION: These criteria include:  The patient is eating (either orally or via tube) and/or has been taking other orally administered medications for a least 24 hours  The patient has no evidence of active gastrointestinal bleeding or impaired GI absorption (gastrectomy, short bowel, patient on TNA or NPO).  If you have questions about this conversion, please contact the Pharmacy Department  []   [phone] )  ( [phone] []   [phone] )  [facility] []   [phone] )  [facility] []   [phone] )  [facility] [x]   [phone] )  [facility]   [place], [facility] 03/11/2017 11:16 AM   ( [phone], PharmD, BCPS Pager: [phone] 03/11/2017 11:17 AM

## 2017-03-11 NOTE — Progress Notes (Signed)
Physical Therapy Treatment Patient Details Name: Matthew Riley MRN: [id number] DOB: Oct 12, 1975 Today's Date: 03/11/2017    History of Present Illness Sigl is a 41 y/o male with PMH significant for HTN, type 1 diabetes with renal disease and prior hx of CAD status post stenting; stroke. Moved from [place] and has been  been w/o insulin treatment. Patient presented 03/08/17 with 3-4 days of generalized weakness and tiredness, positive nausea, vomiting, abd pain , patient i DKA    PT Comments    Assisted OOB to amb an increased distance.  Very unsteady gait with heavy lean on walker and decreased stance/WB thru L LE.  Increased c/o L LE pain(anterior thigh) heavy cramping  Pt plans to return to sisters home  Follow Up Recommendations  Home health PT     Equipment Recommendations  Rolling walker with 5" wheels;Crutches    Recommendations for Other Services       Precautions / Restrictions Precautions Precautions: Fall Precaution Comments: left leg is weak Restrictions Weight Bearing Restrictions: No Other Position/Activity Restrictions: WBAT     Mobility  Bed Mobility Overal bed mobility: Needs Assistance Bed Mobility: Supine to Sit;Sit to Supine   Sidelying to sit: Min assist Supine to sit: Min assist     General bed mobility comments: assist with left leg OOB and back into bed  Transfers Overall transfer level: Needs assistance Equipment used: Rolling walker (2 wheeled) Transfers: Sit to/from Stand Sit to Stand: Min assist;From elevated surface         General transfer comment: steady assist at RW and 50% VC's on safety with turns  Ambulation/Gait Ambulation/Gait assistance: Min assist;Mod assist Ambulation Distance (Feet): 45 Feet Assistive device: Rolling walker (2 wheeled) Gait Pattern/deviations: Step-to pattern;Antalgic;Decreased step length - left;Decreased stance time - left;Decreased dorsiflexion - left Gait velocity: decreased   General Gait  Details: antalgic on LLE and noted decreased DF, steady assist for balance       X 1 L knee buckle   Stairs            Wheelchair Mobility    Modified Rankin (Stroke Patients Only)       Balance                                            Cognition Arousal/Alertness: Awake/alert Behavior During Therapy: WFL for tasks assessed/performed Overall Cognitive Status: Within Functional Limits for tasks assessed                                        Exercises      General Comments        Pertinent Vitals/Pain Pain Assessment: 0-10 Pain Score: 6  Pain Location: left leg Pain Descriptors / Indicators: Burning;Contraction;Cramping Pain Intervention(s): Monitored during session;Patient requesting pain meds-RN notified;Repositioned    Home Living                      Prior Function            PT Goals (current goals can now be found in the care plan section) Progress towards PT goals: Progressing toward goals    Frequency    Min 3X/week      PT Plan Current plan remains appropriate    Co-evaluation  AM-PAC PT "6 Clicks" Daily Activity  Outcome Measure  Difficulty turning over in bed (including adjusting bedclothes, sheets and blankets)?: A Little Difficulty moving from lying on back to sitting on the side of the bed? : A Little Difficulty sitting down on and standing up from a chair with arms (e.g., wheelchair, bedside commode, etc,.)?: A Little Help needed moving to and from a bed to chair (including a wheelchair)?: A Little Help needed walking in hospital room?: A Little Help needed climbing 3-5 steps with a railing? : A Lot 6 Click Score: 17    End of Session Equipment Utilized During Treatment: Gait belt Activity Tolerance: Patient limited by pain Patient left: in bed;with call bell/phone within reach Nurse Communication: Mobility status PT Visit Diagnosis: Unsteadiness on feet  (R26.81);Difficulty in walking, not elsewhere classified (R26.2);History of falling (Z91.81)     Time: 8546-2703 PT Time Calculation (min) (ACUTE ONLY): 25 min  Charges:  $Gait Training: 8-22 mins $Therapeutic Activity: 8-22 mins                    G Codes:       Felecia Shelling  PTA [facility]  Acute  Rehab Pager      [phone]

## 2017-03-11 NOTE — Care Management Note (Signed)
Case Management Note  Patient Details  Name: Jared Whorley MRN: [id number] Date of Birth: 09/11/1975  Subjective/Objective: 41 y/o m admitted w/DKA. Just moved from [place] to [place]-living w/sister. Not working currently. No pcp.No health insurance. Attempted to contact [facility] for hospital f/u appt-non available. Provided patient w/[facility] pcp list-patient will make own appt. MATCH program for insulin(form in shadow chart)-patient informed of policy-1xuse for 12 months/$3 co pay(he can afford)/select pharmacies-patient voiced understanding. [facility] aware of HHPT-no insurance they will check if they can provide charity. No further CM needs.                   Action/Plan:d/c home.   Expected Discharge Date:                  Expected Discharge Plan:  Home/Self Care  In-House Referral:     Discharge planning Services  CM Consult, MATCH Program  Post Acute Care Choice:    Choice offered to:     DME Arranged:    DME Agency:     HH Arranged:    HH Agency:     Status of Service:  In process, will continue to follow  If discussed at Long Length of Stay Meetings, dates discussed:    Additional Comments:  Lanier Clam, RN 03/11/2017, 2:24 PM

## 2017-03-12 DIAGNOSIS — G629 Polyneuropathy, unspecified: Secondary | ICD-10-CM

## 2017-03-12 DIAGNOSIS — E1065 Type 1 diabetes mellitus with hyperglycemia: Secondary | ICD-10-CM

## 2017-03-12 LAB — GLUCOSE, CAPILLARY
GLUCOSE-CAPILLARY: 272 mg/dL — AB (ref 65–99)
Glucose-Capillary: 97 mg/dL (ref 65–99)
Glucose-Capillary: 310 mg/dL — ABNORMAL HIGH (ref 65–99)
Glucose-Capillary: 293 mg/dL — ABNORMAL HIGH (ref 65–99)
Glucose-Capillary: 92 mg/dL (ref 65–99)

## 2017-03-12 LAB — BASIC METABOLIC PANEL
Anion gap: 8 (ref 5–15)
BUN: 12 mg/dL (ref 6–20)
CALCIUM: 8.7 mg/dL — AB (ref 8.9–10.3)
CO2: 28 mmol/L (ref 22–32)
CREATININE: 0.8 mg/dL (ref 0.61–1.24)
Chloride: 96 mmol/L — ABNORMAL LOW (ref 101–111)
GFR calc non Af Amer: >60 mL/min (ref >60)
Glucose, Bld: 256 mg/dL — ABNORMAL HIGH (ref 65–99)
Potassium: 4.2 mmol/L (ref 3.5–5.1)
Sodium: 132 mmol/L — ABNORMAL LOW (ref 135–145)

## 2017-03-12 MED ORDER — INSULIN ASPART PROT & ASPART (70-30 MIX) 100 UNIT/ML ~~LOC~~ SUSP: 35.0000 [IU] | Freq: Two times a day (BID) | SUBCUTANEOUS | 3 refills | Status: DC

## 2017-03-12 MED ORDER — BLOOD GLUCOSE METER KIT: PACK | 0 refills | Status: DC

## 2017-03-12 MED ORDER — "INSULIN SYRINGE 31G X 5/16"" 0.5 ML MISC": 0 refills | Status: DC

## 2017-03-12 MED ORDER — INSULIN ASPART 100 UNIT/ML ~~LOC~~ SOLN: 5.0000 [IU] | Freq: Three times a day (TID) | SUBCUTANEOUS | 11 refills | Status: DC

## 2017-03-12 MED ORDER — PROMETHAZINE HCL 25 MG PO TABS: 25.0000 mg | ORAL_TABLET | Freq: Three times a day (TID) | ORAL | 0 refills | Status: DC | PRN

## 2017-03-12 MED ORDER — GABAPENTIN 100 MG PO CAPS: 200.0000 mg | ORAL_CAPSULE | Freq: Three times a day (TID) | ORAL | 1 refills | Status: DC

## 2017-03-12 MED ORDER — LISINOPRIL 20 MG PO TABS
20.0000 mg | ORAL_TABLET | Freq: Every day | ORAL | 1 refills | Status: DC
Start: 2017-03-13 — End: 2017-03-22

## 2017-03-12 MED ORDER — PANTOPRAZOLE SODIUM 40 MG PO TBEC: 40.0000 mg | DELAYED_RELEASE_TABLET | Freq: Every day | ORAL | 1 refills | Status: DC

## 2017-03-12 NOTE — Progress Notes (Signed)
Physical Therapy Treatment Patient Details Name: Matthew Riley MRN: [id number] DOB: Nov 20, 1975 Today's Date: 03/12/2017    History of Present Illness Self is a 41 y/o male with PMH significant for HTN, type 1 diabetes with renal disease and prior hx of CAD status post stenting; stroke. Moved from [place] and has been  been w/o insulin treatment. Patient presented 03/08/17 with 3-4 days of generalized weakness and tiredness, positive nausea, vomiting, abd pain , patient i DKA    PT Comments    Pt OOB in recliner feeling "better".  Assisted with amb in hallway 3 times with walker as pt required a sitting rest break between each.  L knee buckled twice/pt self corrected.  Heavy lean on walker.  Pt plans to return to his sisters home.   Follow Up Recommendations  Home health PT     Equipment Recommendations  Rolling walker with 5" wheels;Crutches    Recommendations for Other Services       Precautions / Restrictions Precautions Precautions: Fall Precaution Comments: left leg is weak Restrictions Weight Bearing Restrictions: No Other Position/Activity Restrictions: WBAT     Mobility  Bed Mobility               General bed mobility comments: OOB in recliner  Transfers Overall transfer level: Needs assistance Equipment used: Rolling walker (2 wheeled) Transfers: Sit to/from Stand Sit to Stand: Min assist;From elevated surface         General transfer comment: steady assist at RW and 50% VC's on safety with turns  Ambulation/Gait Ambulation/Gait assistance: Min assist;Mod assist Ambulation Distance (Feet): 85 Feet Assistive device: Rolling walker (2 wheeled) Gait Pattern/deviations: Step-to pattern;Antalgic;Decreased step length - left;Decreased stance time - left;Decreased dorsiflexion - left Gait velocity: decreased   General Gait Details: tolerated an increased distance.  Amb three times requiring a sitting rest break between each.  L knee buckled x 2.      Stairs            Wheelchair Mobility    Modified Rankin (Stroke Patients Only)       Balance                                            Cognition Arousal/Alertness: Awake/alert Behavior During Therapy: WFL for tasks assessed/performed Overall Cognitive Status: Within Functional Limits for tasks assessed                                        Exercises      General Comments        Pertinent Vitals/Pain Pain Assessment: Faces Faces Pain Scale: Hurts a little bit Pain Location: left leg Pain Descriptors / Indicators: Burning;Contraction;Cramping Pain Intervention(s): Monitored during session;Repositioned    Home Living                      Prior Function            PT Goals (current goals can now be found in the care plan section) Progress towards PT goals: Progressing toward goals    Frequency    Min 3X/week      PT Plan Current plan remains appropriate    Co-evaluation              AM-PAC PT "6  Clicks" Daily Activity  Outcome Measure  Difficulty turning over in bed (including adjusting bedclothes, sheets and blankets)?: A Little Difficulty moving from lying on back to sitting on the side of the bed? : A Little Difficulty sitting down on and standing up from a chair with arms (e.g., wheelchair, bedside commode, etc,.)?: A Little Help needed moving to and from a bed to chair (including a wheelchair)?: A Little Help needed walking in hospital room?: A Little Help needed climbing 3-5 steps with a railing? : A Lot 6 Click Score: 17    End of Session Equipment Utilized During Treatment: Gait belt Activity Tolerance: Patient tolerated treatment well Patient left: in chair;with call bell/phone within reach;with chair alarm set Nurse Communication: Mobility status PT Visit Diagnosis: Unsteadiness on feet (R26.81);Difficulty in walking, not elsewhere classified (R26.2);History of falling  (Z91.81)     Time: 4403-4742 PT Time Calculation (min) (ACUTE ONLY): 26 min  Charges:  $Gait Training: 8-22 mins $Therapeutic Activity: 8-22 mins                    G Codes:       Felecia Shelling  PTA [facility]  Acute  Rehab Pager      [phone]

## 2017-03-12 NOTE — Progress Notes (Signed)
Inpatient Diabetes Program Recommendations  AACE/ADA: New Consensus Statement on Inpatient Glycemic Control (2015)  Target Ranges:  Prepandial:   less than 140 mg/dL      Peak postprandial:   less than 180 mg/dL (1-2 hours)      Critically ill patients:  140 - 180 mg/dL   Lab Results  Component Value Date   GLUCAP 310 (H) 03/12/2017   HGBA1C 12.9 (H) 03/08/2017    Review of Glycemic Control  Blood sugars elevated this am.   Pt has appt with PCP for management of his DM1 on October 18th.  On 70/30 35 units bid. Blood sugar in 300s this am. May need increase in insulin.  Please discharge with prescription for meter and supplies. Will also need insulin syringes.  Stressed importance of eating regular meals, not skipping, and trying to eat about the same amount of food each day. Check blood sugars at least 3-4x/day.Take logbook with you when you go to MD appt.   Pt voiced understanding and appreciative of assistance with getting PCP and help with meds, meter.   Continue to follow while inpatient.  Ailene Ards, RD, LDN, CDE Inpatient Diabetes Coordinator [facility] [phone] (pager)

## 2017-03-12 NOTE — Discharge Summary (Signed)
Physician Discharge Summary  Matthew Riley RXY:[id number] DOB: April 08, 1976 DOA: 03/08/2017  PCP: Patient, No Pcp Per  Admit date: 03/08/2017 Discharge date: 03/12/2017  Time spent: 35 minutes  Recommendations for Outpatient Follow-up:  1. Repeat BMET at follow to check electrolytes and renal function  2. Reassess BP and adjust antihypertensive regimen  3. Please follow CBG's and A1C closely and adjust hypoglycemic regimen as needed.  Discharge Diagnoses:  Principal Problem:   DKA, type 1 ([place]) Active Problems:   Nausea & vomiting   GERD (gastroesophageal reflux disease)   Hyponatremia   AKI (acute kidney injury) ([place])   Hyperkalemia   Uncontrolled type 1 diabetes mellitus with hyperglycemia ([place])   Neuropathy   Discharge Condition: stable and improved. Discharge home with instructions to follow up with PCP as instructed.  Diet recommendation: heart healthy diet and modified carbohydrates  Filed Weights   03/08/17 1618 03/08/17 2341  Weight: 90.7 kg (200 lb) 82.4 kg (181 lb 10.5 oz)    History of present illness:  41 y/o male with PMH significant for HTN, type 1 diabetes with renal disease and prior hx of CAD status post stenting; who relocated from [place] couple months ago and for the last 4 weeks or so has been w/o insulin treatment. Patient presented with 3-4 days of generalized weakness and tiredness, positive nausea, vomiting, abd pain and difficulty keeping anything down. Patient denies CP, SOB, hematuria, dysuria, HA;s or any other complaints.   Hospital Course:  Type 1 diabetes with DKA: most likely from medicationno compliance and diet indiscretion. -anion gap and bicarbonate has remained stable -CBG's in the 200-280's range without further hypoglycemic episode. -will discharge on novolog 5 units TID and the use of 70/30 35 units BID -patient advise to maintain good hydration and modified carbohydrates. -will require close follow up with PCP for further adjustment on  hypoglycemic regimen at discharge -A1C 12.9  AKI -much improved/resolved with IVF's -most likely due to dehydration/pre-renal azotemia -will recommend BMET at follow up visit to monitor trend   HTN -once normal renal function achieved, patient discharge on lisinopril 33m daily -will need follow up on BP and adjustment of antihypertensive regimen as needed. -advise to follow heart healthy diet.  N/V and GERD -no further nausea or vomiting  -will continue PPI at discharge -will also continue PRN antiemetics  Hyponatremia/hyperkalemia -pseudo-hyponatremia -associated with dehydration and high CBG's -improved/resolved at discharge -will need repeat BMET at follow up to assess electrolytes trend -advise to maintain proper hydration  LLE weakness -after completing proper IVF's resuscitation and electrolytes repletion, patient [facility] -patient symptoms most likely associated with physical deconditioning and diabetic neuropathy. -will focus efforts on controlling diabetes and will continue use of gabapentin and [facility]. -patient with recent CT and MRI of his back; no major abnormalities seen to explain problems.  Procedures:  See below for x-ray reports   Consultations:  Diabetes coordinator   Discharge Exam: Vitals:   03/12/17 0816 03/12/17 1348  BP: (!) 160/103 (!) 153/107  Pulse: 80 92  Resp:  18  Temp:  97.9 F (36.6 C)  SpO2:  100%    General:  No fever, no CP, no SOB. Reports no nausea or vomiting. No further episodes of hypoglycemia. Reported improvement in his muscle spasm/generalized numbness.   Cardiovascular: RRR, no rubs, no gallops  Respiratory: normal resp effort, CTA bilaterally  Abdomen: soft, NT, ND, positive BS  Musculoskeletal: no edema, no cyanosis. Continue  to have MS 4/5 in his LLE and decrease grip. Patient also expressed intermittent diffuse muscle spasm.  Discharge  Instructions   Discharge Instructions    Diet - low sodium heart healthy    Complete by:  As directed    Diet Carb Modified    Complete by:  As directed    Discharge instructions    Complete by:  As directed    Keep yourself well hydrated  Follow modified carbohydrates diet  Keep yourself well hydrated Please follow up with PCP as instructed     Current Discharge Medication List    START taking these medications   Details  blood glucose meter kit and supplies Dispense based on patient and insurance preference. Use up to four times daily as directed. (FOR ICD-9 250.00, 250.01). Qty: 1 each, Refills: 0    gabapentin (NEURONTIN) 100 MG capsule Take 2 capsules (200 mg total) by mouth 3 (three) times daily. Qty: 180 capsule, Refills: 1    insulin aspart (NOVOLOG) 100 UNIT/ML injection Inject 5 Units into the skin 3 (three) times daily with meals. Qty: 10 mL, Refills: 11    insulin aspart protamine- aspart (NOVOLOG MIX 70/30) (70-30) 100 UNIT/ML injection Inject 0.35 mLs (35 Units total) into the skin 2 (two) times daily with a meal. Qty: 10 mL, Refills: 3    Insulin Syringe-Needle U-100 (INSULIN SYRINGE .5CC/31GX5/16") 31G X 5/16" 0.5 ML MISC Use as needed to inject insulin as instructed Qty: 300 each, Refills: 0    lisinopril (PRINIVIL,ZESTRIL) 20 MG tablet Take 1 tablet (20 mg total) by mouth daily. Qty: 30 tablet, Refills: 1    pantoprazole (PROTONIX) 40 MG tablet Take 1 tablet (40 mg total) by mouth daily. Qty: 30 tablet, Refills: 1      CONTINUE these medications which have CHANGED   Details  promethazine (PHENERGAN) 25 MG tablet Take 1 tablet (25 mg total) by mouth every 8 (eight) hours as needed for nausea, vomiting or refractory nausea / vomiting. Qty: 20 tablet, Refills: 0      STOP taking these medications     insulin NPH-regular Human (NOVOLIN 70/30) (70-30) 100 UNIT/ML injection      loperamide (IMODIUM) 2 MG capsule      oxyCODONE-acetaminophen  (PERCOCET/ROXICET) 5-325 MG tablet        No Known Allergies Follow-up Information    [facility]. Go on 03/22/2017.   Why:  at 8:30am for a hospital follow up appointment,   Once you have an appointment at 1 of these 3 clinics, you may use this locaiton for your PHARMACY needs and you may schedule a FINANCIAL COUNSELING appointment. Contact information: [address] [place] [address] [phone]          The results of significant diagnostics from this hospitalization (including imaging, microbiology, ancillary and laboratory) are listed below for reference.    Significant Diagnostic Studies: Ct Abdomen Pelvis Wo Contrast  Result Date: 03/08/2017 CLINICAL DATA:  Generalized abdominal pain. EXAM: CT ABDOMEN AND PELVIS WITHOUT CONTRAST TECHNIQUE: Multidetector CT imaging of the abdomen and pelvis was performed following the standard protocol without IV contrast. COMPARISON:  CT abdomen pelvis dated March 01, 2017. FINDINGS: Lower chest: No acute abnormality. Hepatobiliary: No focal liver abnormality is seen. No gallstones, gallbladder wall thickening, or biliary dilatation. Pancreas: Unremarkable. No pancreatic ductal dilatation or surrounding inflammatory changes. Spleen: Normal in size without focal abnormality. Adrenals/Urinary Tract: Adrenal glands are unremarkable. Mild fullness of the left greater  than right renal collecting systems. No renal calculi. No hydroureter. Bladder is unremarkable. Stomach/Bowel: Stomach is within normal limits. Appendix is not well seen, however there are no secondary signs of inflammation in the right lower quadrant. No evidence of bowel wall thickening, distention, or inflammatory changes. Prominent stool throughout the colon. Vascular/Lymphatic: Minimal aortic atherosclerosis. No enlarged abdominal or pelvic lymph nodes. Reproductive: Prostate is unremarkable. Other: Mild mesenteric edema.  No  pneumoperitoneum or free fluid. Musculoskeletal: No acute or significant osseous findings. IMPRESSION: 1. Limited evaluation without intravenous contrast. No definite acute intra-abdominal process. Mild fullness of the left greater than right renal collecting systems without definite evidence of obstruction. 2. Mild mesenteric and generalized subcutaneous edema. 3.  Prominent stool throughout the colon favors constipation. Electronically Signed   By: Titus Dubin M.D.   On: 03/08/2017 17:55   Dg Chest 2 View  Result Date: 03/08/2017 CLINICAL DATA:  Shortness of breath. EXAM: CHEST  2 VIEW COMPARISON:  None. FINDINGS: The heart size and mediastinal contours are within normal limits. Both lungs are clear. The visualized skeletal structures are unremarkable. IMPRESSION: Normal chest x-ray. Electronically Signed   By: Titus Dubin M.D.   On: 03/08/2017 13:32   Mr Lumbar Spine Wo Contrast  Result Date: 02/21/2017 CLINICAL DATA:  Back pain and bladder and bowel incontinence. EXAM: MRI LUMBAR SPINE WITHOUT CONTRAST TECHNIQUE: Multiplanar, multisequence MR imaging of the lumbar spine was performed. No intravenous contrast was administered. COMPARISON:  None. FINDINGS: Segmentation:  Standard. Alignment:  Physiologic. Vertebrae:  No fracture, evidence of discitis, or bone lesion. Conus medullaris: Extends to the L1 level and appears normal. Paraspinal and other soft tissues: Negative. Disc levels: T12-L1: Normal disc space and facets. No spinal canal or neuroforaminal stenosis. L1-L2: Normal disc space and facets. No spinal canal or neuroforaminal stenosis. L2-L3: Normal disc space and facets. No spinal canal or neuroforaminal stenosis. L3-L4: Normal disc space and facets. No spinal canal or neuroforaminal stenosis. L4-L5: Small disc bulge and mild facet hypertrophy. Mild spinal canal stenosis. No neural impingement. L5-S1: Small disc bulge with annular fissure centrally. No spinal canal stenosis. Mild right  and mild-to-moderate left neural foraminal stenosis. Visualized sacrum: Normal. IMPRESSION: 1. No cauda equina compression or high-grade spinal canal stenosis. Mild spinal canal stenosis at L4-L5 secondary to small disc bulge. 2. Mild right and mild-to-moderate left L5-S1 neural foraminal stenosis. Electronically Signed   By: Ulyses Jarred M.D.   On: 02/21/2017 02:34   Ct Abdomen Pelvis W Contrast  Result Date: 02/21/2017 CLINICAL DATA:  Abdominal pain with vomiting and nausea EXAM: CT ABDOMEN AND PELVIS WITH CONTRAST TECHNIQUE: Multidetector CT imaging of the abdomen and pelvis was performed using the standard protocol following bolus administration of intravenous contrast. CONTRAST:  126m ISOVUE-300 IOPAMIDOL (ISOVUE-300) INJECTION 61% COMPARISON:  None. FINDINGS: Lower chest: No acute abnormality. Hepatobiliary: No focal liver abnormality is seen. No gallstones, gallbladder wall thickening, or biliary dilatation. Pancreas: Unremarkable. No pancreatic ductal dilatation or surrounding inflammatory changes. Spleen: Normal in size without focal abnormality. Adrenals/Urinary Tract: Adrenal glands are unremarkable. Kidneys are normal, without renal calculi, focal lesion, or hydronephrosis. Bladder is unremarkable. Stomach/Bowel: Stomach is within normal limits. Appendix not well seen but no right lower quadrant inflammatory process is identified. No evidence of bowel wall thickening, distention, or inflammatory changes. Vascular/Lymphatic: Aortic atherosclerosis. No enlarged abdominal or pelvic lymph nodes. Reproductive: Prostate is unremarkable. Other: Negative for free air or free fluid. Musculoskeletal: No acute or suspicious bone lesion. IMPRESSION: 1. No CT evidence for  acute intra-abdominal or pelvic abnormality. Negative for a bowel obstruction. Electronically Signed   By: Donavan Foil M.D.   On: 02/21/2017 02:47    Microbiology: Recent Results (from the past 240 hour(s))  MRSA PCR Screening      Status: None   Collection Time: 03/08/17 10:42 PM  Result Value Ref Range Status   MRSA by PCR NEGATIVE NEGATIVE Final    Comment:        The GeneXpert MRSA Assay (FDA approved for NASAL specimens only), is one component of a comprehensive MRSA colonization surveillance program. It is not intended to diagnose MRSA infection nor to guide or monitor treatment for MRSA infections.      Labs: Basic Metabolic Panel:  Recent Labs Lab 03/08/17 2050  03/09/17 0330 03/09/17 0703 03/10/17 0513 03/11/17 0812 03/12/17 0618  NA 135  < > 137 135 134* 130* 132*  K 4.2  < > 3.9 4.0 3.5 3.7 4.2  CL 99*  < > 103 100* 101 93* 96*  CO2 16*  < > _0 GLUCOSE 363*  < > 104* 249* 190* 293* 256*  BUN 29*  < > 27* 26* _1 CREATININE 1.68*  < > 1.24 1.18 0.84 0.77 0.80  CALCIUM 8.8*  < > 8.4* 8.4* 8.5* 8.6* 8.7*  MG 1.9  --   --   --   --   --   --   PHOS 4.2  --   --   --   --   --   --   < > = values in this interval not displayed.   Liver Function Tests:  Recent Labs Lab 03/08/17 1450  AST 153*  ALT 159*  ALKPHOS 122  BILITOT 2.3*  PROT 9.4*  ALBUMIN 3.6    Recent Labs Lab 03/08/17 1450  LIPASE 27   CBC:  Recent Labs Lab 03/08/17 1450 03/08/17 2050 03/11/17 0812  WBC 11.9* 14.6* 4.7  HGB 14.9 13.1 12.2*  HCT 42.8 37.6* 34.9*  MCV 89.9 87.4 86.6  PLT 302 253 191   BNP (last 3 results)  Recent Labs  03/08/17 1450  BNP 55.1   CBG:  Recent Labs Lab 03/11/17 2150 03/12/17 0128 03/12/17 0552 03/12/17 0738 03/12/17 1143  GLUCAP 265* 97 272* 310* 293*     Signed:  Barton Dubois MD.  [facility] 03/12/2017, 5:07 PM

## 2017-03-13 ENCOUNTER — Telehealth: Payer: Self-pay | Admitting: Infectious Disease

## 2017-03-13 LAB — RNA QUALITATIVE: HIV 1 RNA Qualitative: UNDETERMINED

## 2017-03-13 LAB — HIV ANTIBODY (ROUTINE TESTING W REFLEX): HIV SCREEN 4TH GENERATION: REACTIVE — AB

## 2017-03-13 LAB — HIV 1/2 AB DIFFERENTIATION
HIV 1 Ab: NEGATIVE
HIV 2 Ab: NEGATIVE
Note: NEGATIVE

## 2017-03-13 NOTE — Telephone Encounter (Signed)
Patients HIV ag/ab test was + , 'discriminatory assay negative " and then insufficient quantity to do HIV RNA quant  Tammy or Marcelino Duster can we get [facility] to bring this guy in to [facility] for HIV quant RNA ? He either has acute HIV or a false + test

## 2017-03-22 ENCOUNTER — Encounter (HOSPITAL_COMMUNITY): Payer: Self-pay | Admitting: Emergency Medicine

## 2017-03-22 ENCOUNTER — Emergency Department (HOSPITAL_COMMUNITY)
Admission: EM | Admit: 2017-03-22 | Discharge: 2017-03-22 | Disposition: A | Discharge status: Home / Self Care | Payer: Medicaid Other | Attending: Emergency Medicine | Admitting: Emergency Medicine

## 2017-03-22 ENCOUNTER — Ambulatory Visit: Payer: Medicaid Other | Attending: Family Medicine | Admitting: Physician Assistant

## 2017-03-22 ENCOUNTER — Emergency Department (HOSPITAL_COMMUNITY): Payer: Medicaid Other

## 2017-03-22 VITALS — BP 151/113 | HR 94 | Temp 98.4°F | Resp 18 | Ht 73.0 in | Wt 222.6 lb

## 2017-03-22 DIAGNOSIS — Z79899 Other long term (current) drug therapy: Secondary | ICD-10-CM | POA: Insufficient documentation

## 2017-03-22 DIAGNOSIS — W182XXA Fall in (into) shower or empty bathtub, initial encounter: Secondary | ICD-10-CM | POA: Diagnosis not present

## 2017-03-22 DIAGNOSIS — F17210 Nicotine dependence, cigarettes, uncomplicated: Secondary | ICD-10-CM | POA: Insufficient documentation

## 2017-03-22 DIAGNOSIS — Z8673 Personal history of transient ischemic attack (TIA), and cerebral infarction without residual deficits: Secondary | ICD-10-CM | POA: Insufficient documentation

## 2017-03-22 DIAGNOSIS — Y929 Unspecified place or not applicable: Secondary | ICD-10-CM | POA: Insufficient documentation

## 2017-03-22 DIAGNOSIS — E1065 Type 1 diabetes mellitus with hyperglycemia: Secondary | ICD-10-CM | POA: Insufficient documentation

## 2017-03-22 DIAGNOSIS — Z955 Presence of coronary angioplasty implant and graft: Secondary | ICD-10-CM | POA: Diagnosis not present

## 2017-03-22 DIAGNOSIS — Z794 Long term (current) use of insulin: Secondary | ICD-10-CM | POA: Diagnosis not present

## 2017-03-22 DIAGNOSIS — I639 Cerebral infarction, unspecified: Secondary | ICD-10-CM

## 2017-03-22 DIAGNOSIS — Y999 Unspecified external cause status: Secondary | ICD-10-CM | POA: Insufficient documentation

## 2017-03-22 DIAGNOSIS — Y93E1 Activity, personal bathing and showering: Secondary | ICD-10-CM | POA: Insufficient documentation

## 2017-03-22 DIAGNOSIS — S20212A Contusion of left front wall of thorax, initial encounter: Secondary | ICD-10-CM

## 2017-03-22 DIAGNOSIS — I10 Essential (primary) hypertension: Secondary | ICD-10-CM | POA: Insufficient documentation

## 2017-03-22 DIAGNOSIS — I2510 Atherosclerotic heart disease of native coronary artery without angina pectoris: Secondary | ICD-10-CM | POA: Diagnosis not present

## 2017-03-22 DIAGNOSIS — N179 Acute kidney failure, unspecified: Secondary | ICD-10-CM | POA: Insufficient documentation

## 2017-03-22 LAB — GLUCOSE, POCT (MANUAL RESULT ENTRY): POC Glucose: 174 mg/dl — AB (ref 70–99)

## 2017-03-22 MED ORDER — PROMETHAZINE HCL 25 MG PO TABS: 25.0000 mg | ORAL_TABLET | Freq: Three times a day (TID) | ORAL | 0 refills | Status: DC | PRN

## 2017-03-22 MED ORDER — OXYCODONE-ACETAMINOPHEN 5-325 MG PO TABS: 1.0000 | ORAL_TABLET | Freq: Three times a day (TID) | ORAL | 0 refills | Status: DC | PRN

## 2017-03-22 MED ORDER — IBUPROFEN 800 MG PO TABS: 800.0000 mg | ORAL_TABLET | Freq: Three times a day (TID) | ORAL | 0 refills | Status: DC | PRN

## 2017-03-22 MED ORDER — FUROSEMIDE 20 MG PO TABS: 20.0000 mg | ORAL_TABLET | Freq: Every day | ORAL | 3 refills | Status: DC

## 2017-03-22 MED ORDER — TRAMADOL HCL 50 MG PO TABS: 50.0000 mg | ORAL_TABLET | Freq: Four times a day (QID) | ORAL | 0 refills | Status: DC | PRN

## 2017-03-22 MED ORDER — LISINOPRIL 20 MG PO TABS: 20.0000 mg | ORAL_TABLET | Freq: Two times a day (BID) | ORAL | 3 refills | Status: DC

## 2017-03-22 MED FILL — LISINOPRIL 20 MG TABLET: 20 | 30 days supply | Qty: 60 | Fill #0

## 2017-03-22 MED FILL — PROMETHAZINE 25 MG TABLET: 25 | 6 days supply | Qty: 20 | Fill #0

## 2017-03-22 MED FILL — ?FUROSEMIDE 20 MG TABLET: 20 | 30 days supply | Qty: 30 | Fill #0

## 2017-03-22 NOTE — Progress Notes (Addendum)
Matthew Riley  HUD:[id number]  CHY:[id number]  DOB - 09-Nov-1975  Chief Complaint  Patient presents with  . Hypertension  . Diabetes       Subjective:   Matthew Riley is a 41 y.o. male here today for establishment of care. He has a past medical history of type 1 diabetes mellitus, HTN, coronary artery disease and stroke. He is from [place] originally in just moved here one month ago under the care of his sister. He was hospitalized October 5-9 for weakness, fatigue nausea, vomiting and abdominal pain. His sugar was found to be greater than 600. He admits to knowing of his diagnosis of diabetes type 1 but being out of medications for at least 1 month since the transition. Is an ion gap was 27. His A1c was 12.9%. His creatinine was 1.7. CO2 12. Potassium 6.3. He was admitted by the internal medicine team for IV fluids and IV insulin. His creatinine at discharge was 0.8. He was discharged home on an ACE inhibitor once his creatinine stabilized. He also was sent home on NovoLog 5 units with meals and 70/30 35 units twice daily.   He states he has been checking his blood sugar at home is getting numbers around 175-200. He was assessed this morning by home health physical therapist that will start working with him. His urination has improved. He is less weak. He still struggles on the left side from his previous stroke. No chest pain. Appetite okay. Trying to be compliant with his medication regimen. He lives with his sister. He is requesting a Civil engineer, contracting. He is requesting nausea medication refills and pain medication refills.  ROS: GEN: denies fever or chills, denies change in weight Skin: denies lesions or rashes HEENT: denies headache, earache, epistaxis, sore throat, or neck pain LUNGS: denies SHOB, dyspnea, PND, orthopnea CV: denies CP or palpitations ABD: denies abd pain, N or V EXT: denies muscle spasms or swelling;+ pain in lower ext and back, no weakness NEURO: denies numbness or  tingling, denies sz, stroke or TIA   ALLERGIES: No Known Allergies  PAST MEDICAL HISTORY: Past Medical History:  Diagnosis Date  . Diabetes mellitus without complication ([place])   . Hypertension   . Renal disorder   . Stroke [facility])     PAST SURGICAL HISTORY: Past Surgical History:  Procedure Laterality Date  . CORONARY ANGIOPLASTY WITH STENT PLACEMENT      MEDICATIONS AT HOME: Prior to Admission medications   Medication Sig Start Date End Date Taking? Authorizing Provider  blood glucose meter kit and supplies Dispense based on patient and insurance preference. Use up to four times daily as directed. (FOR ICD-9 250.00, 250.01). 03/12/17  Yes Barton Dubois, MD  gabapentin (NEURONTIN) 100 MG capsule Take 2 capsules (200 mg total) by mouth 3 (three) times daily. 03/12/17  Yes Barton Dubois, MD  insulin aspart (NOVOLOG) 100 UNIT/ML injection Inject 5 Units into the skin 3 (three) times daily with meals. 03/12/17  Yes Barton Dubois, MD  insulin aspart protamine- aspart (NOVOLOG MIX 70/30) (70-30) 100 UNIT/ML injection Inject 0.35 mLs (35 Units total) into the skin 2 (two) times daily with a meal. 03/12/17  Yes Barton Dubois, MD  Insulin Syringe-Needle U-100 (INSULIN SYRINGE .5CC/31GX5/16") 31G X 5/16" 0.5 ML MISC Use as needed to inject insulin as instructed 03/12/17  Yes Barton Dubois, MD  lisinopril (PRINIVIL,ZESTRIL) 20 MG tablet Take 1 tablet (20 mg total) by mouth 2 (two) times daily. 03/22/17  Yes Ena Dawley, Saurav Crumble S, PA-C  pantoprazole (  PROTONIX) 40 MG tablet Take 1 tablet (40 mg total) by mouth daily. 03/13/17  Yes Barton Dubois, MD  promethazine (PHENERGAN) 25 MG tablet Take 1 tablet (25 mg total) by mouth every 8 (eight) hours as needed for nausea, vomiting or refractory nausea / vomiting. 03/22/17  Yes Ena Dawley,  S, PA-C  furosemide (LASIX) 20 MG tablet Take 1 tablet (20 mg total) by mouth daily. 03/22/17   Brayton Caves, PA-C  oxyCODONE-acetaminophen (ROXICET) 5-325 MG tablet  Take 1 tablet by mouth every 8 (eight) hours as needed for severe pain. 03/22/17   Brayton Caves, PA-C    Family History  Problem Relation Age of Onset  . Family history unknown: Yes   Social-from [place], lives here now with sister, disabled from CVA  Objective:   Vitals:   03/22/17 0905  BP: (!) 151/113  Pulse: 94  Resp: 18  Temp: 98.4 F (36.9 C)  TempSrc: Oral  SpO2: 99%  Weight: 222 lb 9.6 oz (101 kg)  Height: 6' 1" (1.854 m)    Exam General appearance : Awake, alert, not in any distress. Speech Clear. Not toxic looking HEENT: Atraumatic and Normocephalic, pupils equally reactive to light and accomodation Neck: supple, no JVD. No cervical lymphadenopathy.  Chest:Good air entry bilaterally, no added sounds  CVS: S1 S2 regular, no murmurs.  Abdomen: Bowel sounds present, Non tender and not distended with no guarding, rigidity or rebound. Extremities: B/L Lower Ext shows 2+ edema, both legs are warm to touch; left sided weakness and dec ROM in upper and lower ext Neurology: Awake alert, and oriented X 3, CN II-XII intact, Non focal Skin:No Rash Wounds:ulcers on lower ext, no oozing  Data Review Lab Results  Component Value Date   HGBA1C 12.9 (H) 03/08/2017     Assessment & Plan  1. DM Type 1 with hyperglycemia  -keep a log and bring to next appt; accu checks 3-4 X/day  -Aim for 30 minutes of exercise most days. Rethink what you drink. Water is great! Aim for 2-3 Carb Choices per meal (30-45 grams) +/- 1 either way  Aim for 0-15 Carbs per snack if hungry  Include protein in moderation with your meals and snacks  Consider reading food labels for Total Carbohydrate and Fat Grams of foods  Consider checking BG at alternate times per day  Continue taking medication as directed Be mindful about how much sugar you are adding to beverages and other foods. Fruit Punch - find one with no sugar  Measure and decrease portions of carbohydrate foods  Make your  plate and don't go back for seconds  -cont Nov 5 U TID and 70/30 35 U BID  -Cont ACE  -stay hydrated with water 2. AKI-improved  -Check BMP today  -Cont ACE 3. HTN  -cont ACE  -Add Lasix 4. LEE  -Add Lasix 5. Recent CVA  -HHPT initiated  -shower chair ordered   Return in about 2 weeks (around 04/05/2017).  The patient was given clear instructions to go to ER or return to medical center if symptoms don't improve, worsen or new problems develop. The patient verbalized understanding. The patient was told to call to get lab results if they haven't heard anything in the next week.   Total time spent with patient was 33 min. Greater than 50 % of this visit was spent face to face counseling and coordinating care regarding risk factor modification, compliance importance and encouragement, education related to meds and compliance.  This note has  been created with Surveyor, quantity. Any transcriptional errors are unintentional.    Zettie Pho, PA-C [facility] [place], [place]   03/22/2017, 10:42 AM

## 2017-03-22 NOTE — Patient Instructions (Signed)
Aim for 30 minutes of exercise most days. Rethink what you drink. Water is great! Aim for 2-3 Carb Choices per meal (30-45 grams) +/- 1 either way  Aim for 0-15 Carbs per snack if hungry  Include protein in moderation with your meals and snacks  Consider reading food labels for Total Carbohydrate and Fat Grams of foods  Consider checking BG at alternate times per day  Continue taking medication as directed Be mindful about how much sugar you are adding to beverages and other foods. Fruit Punch - find one with no sugar  Measure and decrease portions of carbohydrate foods  Make your plate and don't go back for seconds   Stay well hydrated with water

## 2017-03-22 NOTE — Discharge Instructions (Signed)
Return here as needed. Follow up with your primary doctor. Use ice over the area that is sore.

## 2017-03-22 NOTE — ED Provider Notes (Signed)
[facility] DEPT Provider Note   CSN: [id number] Arrival date & time: 03/22/17  1024     History   Chief Complaint Chief Complaint  Patient presents with  . Fall  . left lower abd pain    HPI Matthew Riley is a 41 y.o. male.  HPI Patient presents to the emergency department with left rib pain following a fall that occurred last night.  Patient states he fell getting into the bathtub, landing on his left ribs.  Patient states that movement and palpation make the pain worse.  He did not take any medications prior to arrival for his symptoms.  Patient states that he does not have any other injuries.  He denies shortness of breath, nausea, vomiting, abdominal pain, weakness, dizziness, back pain, or syncope.  The patient states that he was recently in the hospital for DKA.  Past Medical History:  Diagnosis Date  . Diabetes mellitus without complication ([place])   . Hypertension   . Renal disorder   . Stroke [facility])     Patient Active Problem List   Diagnosis Date Noted  . Uncontrolled type 1 diabetes mellitus with hyperglycemia ([place])   . Neuropathy   . DKA, type 1 ([place]) 03/08/2017  . Nausea & vomiting 03/08/2017  . GERD (gastroesophageal reflux disease) 03/08/2017  . Hyponatremia 03/08/2017  . AKI (acute kidney injury) ([place]) 03/08/2017  . Hyperkalemia 03/08/2017  . Diabetic ketoacidosis without coma associated with diabetes mellitus due to underlying condition [facility])     Past Surgical History:  Procedure Laterality Date  . CORONARY ANGIOPLASTY WITH STENT PLACEMENT         Home Medications    Prior to Admission medications   Medication Sig Start Date End Date Taking? Authorizing Provider  gabapentin (NEURONTIN) 100 MG capsule Take 2 capsules (200 mg total) by mouth 3 (three) times daily. 03/12/17  Yes Barton Dubois, MD  insulin aspart (NOVOLOG) 100 UNIT/ML injection Inject 5 Units into the skin 3 (three) times daily with meals. 03/12/17  Yes  Barton Dubois, MD  insulin aspart protamine- aspart (NOVOLOG MIX 70/30) (70-30) 100 UNIT/ML injection Inject 0.35 mLs (35 Units total) into the skin 2 (two) times daily with a meal. 03/12/17  Yes Barton Dubois, MD  lisinopril (PRINIVIL,ZESTRIL) 20 MG tablet Take 1 tablet (20 mg total) by mouth 2 (two) times daily. 03/22/17  Yes Ena Dawley, Tiffany S, PA-C  pantoprazole (PROTONIX) 40 MG tablet Take 1 tablet (40 mg total) by mouth daily. 03/13/17  Yes Barton Dubois, MD  promethazine (PHENERGAN) 25 MG tablet Take 1 tablet (25 mg total) by mouth every 8 (eight) hours as needed for nausea, vomiting or refractory nausea / vomiting. 03/22/17  Yes Ena Dawley, Tiffany S, PA-C  blood glucose meter kit and supplies Dispense based on patient and insurance preference. Use up to four times daily as directed. (FOR ICD-9 250.00, 250.01). 03/12/17   Barton Dubois, MD  furosemide (LASIX) 20 MG tablet Take 1 tablet (20 mg total) by mouth daily. 03/22/17   Brayton Caves, PA-C  Insulin Syringe-Needle U-100 (INSULIN SYRINGE .5CC/31GX5/16") 31G X 5/16" 0.5 ML MISC Use as needed to inject insulin as instructed 03/12/17   Barton Dubois, MD  oxyCODONE-acetaminophen (ROXICET) 5-325 MG tablet Take 1 tablet by mouth every 8 (eight) hours as needed for severe pain. 03/22/17   Brayton Caves, PA-C    Family History Family History  Problem Relation Age of Onset  . Family history unknown: Yes    Social History  Social History  Substance Use Topics  . Smoking status: Current Some Day Smoker  . Smokeless tobacco: Never Used  . Alcohol use Yes     Allergies   Patient has no known allergies.   Review of Systems Review of Systems All other systems negative except as documented in the HPI. All pertinent positives and negatives as reviewed in the HPI.  Physical Exam Updated Vital Signs BP (!) 166/118 (BP Location: Left Arm)   Pulse 90   Temp 98.2 F (36.8 C) (Oral)   Resp 18   SpO2 98%   Physical Exam  Constitutional:  He is oriented to person, place, and time. He appears well-developed and well-nourished. No distress.  HENT:  Head: Normocephalic and atraumatic.  Mouth/Throat: Oropharynx is clear and moist.  Eyes: Pupils are equal, round, and reactive to light.  Neck: Normal range of motion. Neck supple.  Cardiovascular: Normal rate, regular rhythm and normal heart sounds.  Exam reveals no gallop and no friction rub.   No murmur heard. Pulmonary/Chest: Effort normal and breath sounds normal. No respiratory distress. He has no wheezes. He exhibits tenderness.    Abdominal: Soft. Bowel sounds are normal. He exhibits no distension. There is no tenderness.  Neurological: He is alert and oriented to person, place, and time. He exhibits normal muscle tone. Coordination normal.  Skin: Skin is warm and dry. Capillary refill takes less than 2 seconds. No rash noted. No erythema.  Psychiatric: He has a normal mood and affect. His behavior is normal.  Nursing note and vitals reviewed.    ED Treatments / Results  Labs (all labs ordered are listed, but only abnormal results are displayed) Labs Reviewed - No data to display  EKG  EKG Interpretation None       Radiology Dg Ribs Unilateral W/chest Left  Result Date: 03/22/2017 CLINICAL DATA:  [place] yesterday.  LEFT rib pain. EXAM: LEFT RIBS AND CHEST - 3+ VIEW COMPARISON:  Chest radiograph March 08, 2017 FINDINGS: No fracture or other bone lesions are seen involving the ribs. There is no evidence of pneumothorax or pleural effusion. Both lungs are clear. Heart size and mediastinal contours are within normal limits. IMPRESSION: Stable normal chest radiograph, no rib fracture deformity. Electronically Signed   By: Elon Alas M.D.   On: 03/22/2017 18:43    Procedures Procedures (including critical care time)  Medications Ordered in ED Medications - No data to display   Initial Impression / Assessment and Plan / ED Course  I have reviewed the  triage vital signs and the nursing notes.  Pertinent labs & imaging results that were available during my care of the patient were reviewed by me and considered in my medical decision making (see chart for details).    Patient will be treated for rib contusion. He is advised return here as needed.  Patient agrees the plan and all questions were answered.  His vital signs remained stable.  Final Clinical Impressions(s) / ED Diagnoses   Final diagnoses:  None    New Prescriptions New Prescriptions   No medications on file     Dalia Heading, Hershal Coria 03/23/17 0047    Fatima Blank, MD 03/24/17 [phone]

## 2017-03-22 NOTE — Progress Notes (Signed)
Patient is here for f/up   Patient stated that he fell 2 times yesterday one by the stairs and the 2nd time by the tub   Patient is requesting pain medication for his back & side

## 2017-03-22 NOTE — ED Triage Notes (Signed)
Patient reports that he fell yesterday and left side and still having pain.

## 2017-03-22 NOTE — ED Notes (Signed)
Bed: WA08 Expected date:  Expected time:  Means of arrival:  Comments: 

## 2017-03-23 LAB — BASIC METABOLIC PANEL
BUN/Creatinine Ratio: 14 (ref 9–20)
BUN: 12 mg/dL (ref 6–24)
CALCIUM: 9.1 mg/dL (ref 8.7–10.2)
CO2: 23 mmol/L (ref 20–29)
CREATININE: 0.85 mg/dL (ref 0.76–1.27)
Chloride: 100 mmol/L (ref 96–106)
GFR calc Af Amer: 124 mL/min/{1.73_m2} (ref >59)
GFR, EST NON AFRICAN AMERICAN: 107 mL/min/{1.73_m2} (ref >59)
GLUCOSE: 193 mg/dL — AB (ref 65–99)
POTASSIUM: 4.4 mmol/L (ref 3.5–5.2)
Sodium: 136 mmol/L (ref 134–144)

## 2017-04-08 ENCOUNTER — Telehealth: Payer: Self-pay | Admitting: Family Medicine

## 2017-04-08 NOTE — Telephone Encounter (Signed)
Deana from [facility] called to request an order for Discharge, please advice?

## 2017-04-08 NOTE — Telephone Encounter (Signed)
Deana from [facility] called to request an order for Discharge, please call her back

## 2017-04-09 ENCOUNTER — Encounter: Payer: Self-pay | Admitting: Family Medicine

## 2017-04-09 ENCOUNTER — Telehealth: Payer: Self-pay

## 2017-04-09 ENCOUNTER — Ambulatory Visit: Payer: Self-pay | Attending: Internal Medicine | Admitting: Licensed Clinical Social Worker

## 2017-04-09 ENCOUNTER — Ambulatory Visit: Payer: Medicaid Other | Attending: Family Medicine | Admitting: Family Medicine

## 2017-04-09 VITALS — BP 149/95 | HR 79 | Temp 98.0°F | Resp 18 | Ht 73.0 in | Wt 209.8 lb

## 2017-04-09 DIAGNOSIS — R531 Weakness: Secondary | ICD-10-CM | POA: Insufficient documentation

## 2017-04-09 DIAGNOSIS — M62838 Other muscle spasm: Secondary | ICD-10-CM | POA: Diagnosis not present

## 2017-04-09 DIAGNOSIS — L03113 Cellulitis of right upper limb: Secondary | ICD-10-CM | POA: Insufficient documentation

## 2017-04-09 DIAGNOSIS — Z794 Long term (current) use of insulin: Secondary | ICD-10-CM | POA: Insufficient documentation

## 2017-04-09 DIAGNOSIS — Z87448 Personal history of other diseases of urinary system: Secondary | ICD-10-CM | POA: Diagnosis not present

## 2017-04-09 DIAGNOSIS — M069 Rheumatoid arthritis, unspecified: Secondary | ICD-10-CM | POA: Diagnosis not present

## 2017-04-09 DIAGNOSIS — I10 Essential (primary) hypertension: Secondary | ICD-10-CM | POA: Insufficient documentation

## 2017-04-09 DIAGNOSIS — E1065 Type 1 diabetes mellitus with hyperglycemia: Secondary | ICD-10-CM | POA: Diagnosis not present

## 2017-04-09 DIAGNOSIS — Z9181 History of falling: Secondary | ICD-10-CM

## 2017-04-09 DIAGNOSIS — R600 Localized edema: Secondary | ICD-10-CM

## 2017-04-09 DIAGNOSIS — F419 Anxiety disorder, unspecified: Secondary | ICD-10-CM | POA: Diagnosis not present

## 2017-04-09 DIAGNOSIS — M7989 Other specified soft tissue disorders: Secondary | ICD-10-CM | POA: Diagnosis not present

## 2017-04-09 DIAGNOSIS — M2540 Effusion, unspecified joint: Secondary | ICD-10-CM

## 2017-04-09 DIAGNOSIS — K219 Gastro-esophageal reflux disease without esophagitis: Secondary | ICD-10-CM | POA: Diagnosis not present

## 2017-04-09 DIAGNOSIS — F329 Major depressive disorder, single episode, unspecified: Secondary | ICD-10-CM | POA: Insufficient documentation

## 2017-04-09 DIAGNOSIS — Z23 Encounter for immunization: Secondary | ICD-10-CM

## 2017-04-09 DIAGNOSIS — E1110 Type 2 diabetes mellitus with ketoacidosis without coma: Secondary | ICD-10-CM | POA: Diagnosis not present

## 2017-04-09 DIAGNOSIS — G8929 Other chronic pain: Secondary | ICD-10-CM | POA: Diagnosis not present

## 2017-04-09 DIAGNOSIS — I2510 Atherosclerotic heart disease of native coronary artery without angina pectoris: Secondary | ICD-10-CM | POA: Diagnosis not present

## 2017-04-09 DIAGNOSIS — M545 Low back pain: Secondary | ICD-10-CM | POA: Insufficient documentation

## 2017-04-09 DIAGNOSIS — F4323 Adjustment disorder with mixed anxiety and depressed mood: Secondary | ICD-10-CM

## 2017-04-09 DIAGNOSIS — T07XXXA Unspecified multiple injuries, initial encounter: Secondary | ICD-10-CM

## 2017-04-09 DIAGNOSIS — Z0000 Encounter for general adult medical examination without abnormal findings: Secondary | ICD-10-CM

## 2017-04-09 LAB — GLUCOSE, POCT (MANUAL RESULT ENTRY): POC GLUCOSE: 289 mg/dL — AB (ref 70–99)

## 2017-04-09 MED ORDER — INSULIN ASPART PROT & ASPART (70-30 MIX) 100 UNIT/ML ~~LOC~~ SUSP: 38.0000 [IU] | Freq: Two times a day (BID) | SUBCUTANEOUS | 3 refills | Status: DC

## 2017-04-09 MED ORDER — IBUPROFEN 800 MG PO TABS: 800.0000 mg | ORAL_TABLET | Freq: Three times a day (TID) | ORAL | 0 refills | Status: DC | PRN

## 2017-04-09 MED ORDER — CYCLOBENZAPRINE HCL 10 MG PO TABS: 5.0000 mg | ORAL_TABLET | Freq: Three times a day (TID) | ORAL | 0 refills | Status: DC | PRN

## 2017-04-09 MED ORDER — PNEUMOCOCCAL 13-VAL CONJ VACC IM SUSP: 0.5000 mL | INTRAMUSCULAR | Status: DC

## 2017-04-09 MED ORDER — CEPHALEXIN 500 MG PO CAPS: 500.0000 mg | ORAL_CAPSULE | Freq: Four times a day (QID) | ORAL | 0 refills | Status: DC

## 2017-04-09 MED ORDER — BACITRACIN 500 UNIT/GM EX OINT: 1.0000 "application " | TOPICAL_OINTMENT | Freq: Two times a day (BID) | CUTANEOUS | 1 refills | Status: DC

## 2017-04-09 MED FILL — traMADol HCL 50 MG TABS: 50 | 3 days supply | Qty: 15 | Fill #0

## 2017-04-09 MED FILL — IBUPROFEN 800 MG TABLET: 800 | 13 days supply | Qty: 40 | Fill #0

## 2017-04-09 MED FILL — ?CYCLOBENZAPRINE 10 MG TABL: 10 | 20 days supply | Qty: 30 | Fill #0

## 2017-04-09 MED FILL — CEPHALEXIN 500 MG CAPSULE: 500 | 5 days supply | Qty: 20 | Fill #0

## 2017-04-09 NOTE — Patient Instructions (Addendum)
Apply for [employer].   Type 1 Diabetes Mellitus, Self Care, Adult When you have type 1 diabetes (type 1 diabetes mellitus), you must keep your blood sugar (glucose) under control. You can do this with:  Insulin.  Nutrition.  Exercise.  Lifestyle changes.  Other medicines, if needed.  Support from your doctors and others.  How do I manage my blood sugar?  Check your blood sugar every day, as often as told.  Call your doctor if your blood sugar is above your goal numbers for 2 tests in a row.  Have your A1c (hemoglobin A1c) level checked at least twice a year. Have it checked more often if your doctor tells you to. Your doctor will set treatment goals for you. Generally, you should have these blood sugar levels:  Before meals (preprandial): 80-130 mg/dL (4.4-7.2 mmol/L).  After meals (postprandial): below 180 mg/dL (10 mmol/L).  A1c level: less than 7%.  What do I need to know about high blood sugar? High blood sugar is called hyperglycemia. Know the signs of high blood sugar. Signs may include:  Feeling: ? Thirsty. ? Hungry. ? Very tired.  Needing to pee (urinate) more than usual.  Blurry vision.  What do I need to know about low blood sugar? Low blood sugar is called hypoglycemia. This is when blood sugar is at or below 70 mg/dL (3.9 mmol/L). Symptoms may include:  Feeling: ? Hungry. ? Worried or nervous (anxious). ? Sweaty and clammy. ? Confused. ? Dizzy. ? Sleepy. ? Sick to your stomach (nauseous).  Having: ? A fast heartbeat. ? A headache. ? A change in your vision. ? Jerky movements that you cannot control (seizure). ? Nightmares. ? Tingling or no feeling (numbness) around the mouth, lips, or tongue.  Having trouble with: ? Talking. ? Paying attention (concentrating). ? Moving (coordination). ? Sleeping.  Shaking.  Passing out (fainting).  Getting upset easily (irritability).  Treating low blood sugar  To  treat low blood sugar, eat or drink something sugary right away. If you can think clearly and swallow safely, follow the 15:15 rule:  Take 15 grams of a fast-acting carb (carbohydrate). Some fast-acting carbs are: ? 1 tube of glucose gel. ? 3 sugar tablets (glucose pills). ? 6-8 pieces of hard candy. ? 4 oz (120 mL) of fruit juice. ? 4 oz (120 mL) regular (not diet) soda.  Check your blood sugar 15 minutes after you take the carb.  If your blood sugar is still at or below 70 mg/dL (3.9 mmol/L), take 15 grams of a carb again.  If your blood sugar does not go above 70 mg/dL (3.9 mmol/L) after 3 tries, get help right away.  After your blood sugar goes back to normal, eat a meal or a snack within 1 hour.  Treating very low blood sugar If your blood sugar is at or below 54 mg/dL (3 mmol/L), you have very low blood sugar (severe hypoglycemia). This is an emergency. Do not wait to see if the symptoms will go away. Get medical help right away. Call your local emergency services (911 in the [place] Do not drive yourself to the hospital. If you have very low blood sugar and you cannot eat or drink, you may need a glucagon shot (injection). A family member or friend should learn how to check your blood sugar and how to give you a glucagon shot. Ask your doctor if you need to have a glucagon shot kit at home. What else  is important to manage my diabetes? Medicine  Take insulin and diabetes medicines as told.  Adjust your insulin and medicines as told.  Do not run out of insulin or medicines. Having diabetes can put you at risk for other long-term (chronic) conditions. These may include heart disease and kidney disease. Your doctor may prescribe medicines to help prevent problems from diabetes. Food  Make healthy food choices. These include: ? Chicken, fish, egg whites, and beans. ? Oats, whole wheat, bulgur, brown rice, quinoa, and millet. ? Fresh fruits and vegetables. ? Low-fat dairy  products. ? Nuts, avocado, olive oil, and canola oil.  Meet with a food specialist (registered dietitian). He or she can help you make an eating plan that is right for you.  Follow instructions from your doctor about what you cannot eat or drink.  Drink enough fluid to keep your pee (urine) clear or pale yellow.  Eat healthy snacks between healthy meals.  Keep track of carbs that you eat. Do this by reading food labels and learning food serving sizes.  Follow your sick day plan when you cannot eat or drink normally. Make this plan with your doctor so it is ready to use. Activity   Exercise at least 3 times a week.  Do not go more than 2 days without exercising.  Talk with your doctor before you start a new exercise. Your doctor may need to adjust your insulin, medicines, or food. Lifestyle   Do not use any tobacco products. These include cigarettes, chewing tobacco, and e-cigarettes. If you need help quitting, ask your doctor.  Ask your doctor how much alcohol is safe for you.  Learn to deal with stress. If you need help with this, ask your doctor. Body care  Stay up to date with your shots (immunizations).  Have your eyes and feet checked by a doctor as often as told.  Check your skin and feet every day. Check for cuts, bruises, redness, blisters, or sores.  Brush your teeth and gums two times a day, and floss at least one time a day.  Go to the dentist least one time every 6 months.  Stay at a healthy weight. General instructions   Take over-the-counter and prescription medicines only as told by your doctor.  Share your diabetes care plan with: ? Your work or school. ? People you live with.  Check your pee (urine) for ketones: ? When you are sick. ? As told by your doctor.  Carry a card or wear jewelry that says that you have diabetes.  Ask your doctor: ? Do I need to meet with a diabetes educator? ? Where can I find a support group for people with  diabetes?  Keep all follow-up visits as told by your doctor. This is important. Where to find more information: To learn more about diabetes, visit:  American Diabetes Association: www.diabetes.org  American Association of Diabetes Educators: www.diabeteseducator.org/patient-resources  This information is not intended to replace advice given to you by your health care provider. Make sure you discuss any questions you have with your health care provider. Document Released: 09/12/2015 Document Revised: 10/27/2015 Document Reviewed: 06/24/2015 Elsevier Interactive Patient Education  2018 [employer].  Cellulitis, Adult Cellulitis is a skin infection. The infected area is usually red and sore. This condition occurs most often in the arms and lower legs. It is very important to get treated for this condition. Follow these instructions at home:  Take over-the-counter and prescription medicines only as told by your  doctor.  If you were prescribed an antibiotic medicine, take it as told by your doctor. Do not stop taking the antibiotic even if you start to feel better.  Drink enough fluid to keep your pee (urine) clear or pale yellow.  Do not touch or rub the infected area.  Raise (elevate) the infected area above the level of your heart while you are sitting or lying down.  Place warm or cold wet cloths (warm or cold compresses) on the infected area. Do this as told by your doctor.  Keep all follow-up visits as told by your doctor. This is important. These visits let your doctor make sure your infection is not getting worse. Contact a doctor if:  You have a fever.  Your symptoms do not get better after 1-2 days of treatment.  Your bone or joint under the infected area starts to hurt after the skin has healed.  Your infection comes back. This can happen in the same area or another area.  You have a swollen bump in the infected area.  You have new symptoms.  You feel ill and  also have muscle aches and pains. Get help right away if:  Your symptoms get worse.  You feel very sleepy.  You throw up (vomit) or have watery poop (diarrhea) for a long time.  There are red streaks coming from the infected area.  Your red area gets larger.  Your red area turns darker. This information is not intended to replace advice given to you by your health care provider. Make sure you discuss any questions you have with your health care provider. Document Released: 11/07/2007 Document Revised: 10/27/2015 Document Reviewed: 03/30/2015 Elsevier Interactive Patient Education  2018 [employer].

## 2017-04-09 NOTE — BH Specialist Note (Signed)
Integrated Behavioral Health Initial Visit  MRN: [id number] Name: Matthew Riley  Number of Integrated Behavioral Health Clinician visits:: 1/6 Session Start time: 9:45 AM  Session End time: 10:15 AM Total time: 30 minutes  Type of Service: Integrated Behavioral Health- Individual/Family Interpretor:No. Interpretor Name and Language: N.A   Warm Hand Off Completed.       SUBJECTIVE: Matthew Riley is a 41 y.o. male accompanied by self Patient was referred by FNP Hairston for depression and anxiety. Patient reports the following symptoms/concerns: overwhelming feelings of sadness and worry, difficulty sleeping, low motivation, irritability, and feeling bad about mobility concerns Duration of problem: 4 months; Severity of problem: moderate  OBJECTIVE: Mood: Depressed and Affect: Appropriate Risk of harm to self or others: No plan to harm self or others  LIFE CONTEXT: Family and Social: Pt currently resides with his sister, who he receives emotional and financial support from  School/Work: Pt plans to file for disability in a couple of weeks Self-Care: Pt denied substance use Life Changes: Pt had a stroke four months ago resulting in limited mobility. He relocated to [place] to receive support from family  GOALS ADDRESSED: Patient will: 1. Reduce symptoms of: anxiety and depression 2. Increase knowledge and/or ability of: coping skills  3. Demonstrate ability to: Increase healthy adjustment to current life circumstances and Increase adequate support systems for patient/family  INTERVENTIONS: Interventions utilized: Solution-Focused Strategies, Supportive Counseling, Psychoeducation and/or Health Education and Link to [employer]  Standardized Assessments completed: GAD-7 and PHQ 2&9 with C-SSRS  ASSESSMENT: Patient currently experiencing depression and anxiety triggered by ongoing medical concerns, including limited mobility. He reports overwhelming feelings of sadness and  worry, difficulty sleeping, low motivation, and irritability. Pt denied SI/HI/AVH. He receives support from family.   Patient may benefit from psychoeducation, psychotherapy, and medication management. LCSWA educated pt on the correlation between one's physical and mental health. LCSWA discussed therapeutic interventions that can decrease symptoms. Pt is not open to initiating services at this time due to mobility and transportation concerns; however, was successful in identifying healthy coping skills to utilize. LCSWA strongly encouraged pt to schedule appointment with Financial Counseling. Pt was provided resources on crisis intervention, DSS, and food insecurity.  PLAN: 1. Follow up with behavioral health clinician on : Pt was encouraged to contact LCSWA if symptoms worsen or fail to improve to schedule behavioral appointments at [facility]. 2. Behavioral recommendations: LCSWA recommends that pt apply healthy coping skills discussed, schedule appointment with Financial Counselor, and utilize provided resources. Pt is encouraged to schedule follow up appointment with LCSWA 3. Referral(s): Integrated [facility] [facility] (In Clinic) 4. "From scale of 1-10, how likely are you to follow plan?": 8/10  Bridgett Larsson, LCSW 04/11/17 12:34 PM

## 2017-04-09 NOTE — Progress Notes (Signed)
Subjective:  Patient ID: Matthew Riley, male    DOB: March 26, 1976  Age: 40 y.o. MRN: [id number]  CC: Follow-up   HPI Matthew Riley presents for follow up. History of recent hospitalization in October for DKA, AKI, GERD, N/Vweakness. PMH includes HTN, DM type 1, CAD w/ stent. He was treated for DKA, AKI resolved with IVF resuscitation, d/c on ACEI, PPI, antiemetics. Patient was evaluated by PT who recommended [facility] services. Symptoms: none. Patient denies foot ulcerations, nausea, paresthesia of the feet, visual disturbances, vomitting and weight loss.  Evaluation to date has been included: fasting blood sugar and hemoglobin A1C.  Home sugars: He is not consistent with CBG checks. Treatment to date: NovoLog, Humulin 70/30. Hypertension: He is not adherent to low salt diet. He does not check BP at home. Cardiac symptoms lower extremity edema. Patient denies chest pain, chest pressure/discomfort, claudication, dyspnea, near-syncope, palpitations and syncope.  Cardiovascular risk factors: diabetes mellitus, dyslipidemia, hypertension, male gender and sedentary lifestyle. Use of agents associated with hypertension: none. History of target organ damage: prior coronary revascularization. He complains of arthralgias and myalgias for which has been present for 2 months. Pain is located in bilateral hands, is described as aching, and is constant .  He reports difficulty being able to grip and open objects.  Associated symptoms include: decreased range of motion, tenderness and stiffness.  The patient has tried nothing for pain relief.  Related to injury: no.  Low back pain: Onset 3 months ago.  Symptoms include aching.  He denies any paresthesias or history of back injury.  Associated symptoms include muscle spasms and weakness.  Previous workup included recent MRI imaging. History of falling: He reports history of multiple falls. Histroy of ED admit in October r/t fall at home. He denies any syncope.  He reports falling  6 times within the last month with attempting to walk stairs and with bathing.  He reports living with his sister and 2 nieces.  He reports receiving in-home PT.   No facility-administered medications prior to visit.    Outpatient Medications Prior to Visit  Medication Sig Dispense Refill  . blood glucose meter kit and supplies Dispense based on patient and insurance preference. Use up to four times daily as directed. (FOR ICD-9 250.00, 250.01). 1 each 0  . furosemide (LASIX) 20 MG tablet Take 1 tablet (20 mg total) by mouth daily. 30 tablet 3  . gabapentin (NEURONTIN) 100 MG capsule Take 2 capsules (200 mg total) by mouth 3 (three) times daily. 180 capsule 1  . insulin aspart (NOVOLOG) 100 UNIT/ML injection Inject 5 Units into the skin 3 (three) times daily with meals. 10 mL 11  . Insulin Syringe-Needle U-100 (INSULIN SYRINGE .5CC/31GX5/16") 31G X 5/16" 0.5 ML MISC Use as needed to inject insulin as instructed 300 each 0  . lisinopril (PRINIVIL,ZESTRIL) 20 MG tablet Take 1 tablet (20 mg total) by mouth 2 (two) times daily. 60 tablet 3  . oxyCODONE-acetaminophen (ROXICET) 5-325 MG tablet Take 1 tablet by mouth every 8 (eight) hours as needed for severe pain. 20 tablet 0  . pantoprazole (PROTONIX) 40 MG tablet Take 1 tablet (40 mg total) by mouth daily. 30 tablet 1  . promethazine (PHENERGAN) 25 MG tablet Take 1 tablet (25 mg total) by mouth every 8 (eight) hours as needed for nausea, vomiting or refractory nausea / vomiting. 20 tablet 0  . traMADol (ULTRAM) 50 MG tablet Take 1 tablet (50 mg total) by mouth every 6 (six) hours as needed for  severe pain. 15 tablet 0  . ibuprofen (ADVIL,MOTRIN) 800 MG tablet Take 1 tablet (800 mg total) by mouth every 8 (eight) hours as needed. 21 tablet 0  . insulin aspart protamine- aspart (NOVOLOG MIX 70/30) (70-30) 100 UNIT/ML injection Inject 0.35 mLs (35 Units total) into the skin 2 (two) times daily with a meal. 10 mL 3    ROS Review of Systems  Eyes:  Negative.   Respiratory: Negative.   Cardiovascular: Positive for leg swelling.  Gastrointestinal: Negative.   Musculoskeletal: Positive for arthralgias, back pain and myalgias.  Skin: Positive for wound.  Neurological: Positive for weakness.    Objective:  BP (!) 149/95 (BP Location: Left Arm, Patient Position: Sitting, Cuff Size: Normal)   Pulse 79   Temp 98 F (36.7 C) (Oral)   Resp 18   Ht '6\' 1"'  (1.854 m)   Wt 209 lb 12.8 oz (95.2 kg)   SpO2 98%   BMI 27.68 kg/m   BP/Weight 04/17/2017 04/15/2017 62/02/5283  Systolic BP 132 - 440  Diastolic BP 92 - 95  Wt. (Lbs) - 209 209.8  BMI - 27.57 27.68     Physical Exam  Constitutional: He appears well-developed and well-nourished.  Eyes: Conjunctivae are normal. Pupils are equal, round, and reactive to light.  Neck: No JVD present.  Cardiovascular: Normal rate, regular rhythm, normal heart sounds and intact distal pulses.  BLE 1+  Pulmonary/Chest: Effort normal and breath sounds normal.  Abdominal: Soft. Bowel sounds are normal. There is no tenderness.  Musculoskeletal:       Lumbar back: He exhibits pain.       Right hand: He exhibits normal capillary refill. Decreased strength noted.       Left hand: He exhibits normal capillary refill. Decreased strength noted.  Ambulates with assistance of walker.  Skin: Skin is warm and dry. Abrasion (multiple sites to bilateral upper and lower extremities, largest abrasions found posterior lower leg and right lateral forearm.) noted.     Psychiatric: He exhibits a depressed mood. He expresses no homicidal and no suicidal ideation. He expresses no suicidal plans and no homicidal plans.  Nursing note and vitals reviewed.    Assessment & Plan:   1. Uncontrolled type 1 diabetes mellitus with hyperglycemia (HCC) Start check CBGs TID Bring glucometer and/or blood glucose log to next office visit. - Glucose (CBG)  2. Chronic midline low back pain without sciatica  - ibuprofen  (ADVIL,MOTRIN) 800 MG tablet; Take 1 tablet (800 mg total) every 8 (eight) hours as needed by mouth for moderate pain (Take with food).  Dispense: 40 tablet; Refill: 0 - Ambulatory referral to Neurology - cyclobenzaprine (FLEXERIL) 10 MG tablet; Take 0.5 tablets (5 mg total) 3 (three) times daily as needed by mouth for muscle spasms.  Dispense: 30 tablet; Refill: 0 - DG Lumbar Spine Complete; Future  3. Cellulitis of right upper extremity  - cephALEXin (KEFLEX) 500 MG capsule; Take 1 capsule (500 mg total) 4 (four) times daily by mouth.  Dispense: 20 capsule; Refill: 0  4. Adjustment disorder with mixed anxiety and depressed mood Spoke with LCSW and was provided resources. He declines medication or referral at this time.  5. Joint swelling  - Rheumatoid factor - ANA  6. History of recent fall  - Ambulatory referral to Physical Therapy - Ambulatory referral to [facility] - Ambulatory referral to Neurology  7. Lower extremity edema  - VAS [place] LOWER EXTREMITY VENOUS (DVT); Future  8. Weakness Lives with his sister  and two nieces. Need for [facility] services, however complicated by patient insured status. Is currently receiving HH PT on charity basis. Apply for orange card and discount program. Encouraged patient to apply for orange card and discount program. - Ambulatory referral to Physical Therapy - Ambulatory referral to [place] - Ambulatory referral to Neurology  9. Abrasions of multiple sites  - Ambulatory referral to [facility] - bacitracin 500 UNIT/GM ointment; Apply 1 application 2 (two) times daily topically. For 7 days.  Dispense: 15 g; Refill: 1  10. Healthcare maintenance  - Tdap vaccine greater than or equal to 7yo IM - Pneumococcal conjugate vaccine 13-valent  11. Muscle spasm  - cyclobenzaprine (FLEXERIL) 10 MG tablet; Take 0.5 tablets (5 mg total) 3 (three) times daily as needed by mouth for muscle spasms.  Dispense: 30 tablet; Refill:  0      Follow-up: Return in about 2 weeks (around 04/23/2017) for DM/Cellulitus.   Alfonse Spruce FNP

## 2017-04-09 NOTE — Telephone Encounter (Signed)
Referral request received for home RN and PT.  Call placed to [facility] regarding the status of the patient's current therapy. Spoke to [place] who stated that the patient  is still receiving PT and has 4 more visits. Home Health  RN has discharged him and because he does not have insurance, they can't accept a new referral from the community.   Update provided to Arrie Senate, FNP.

## 2017-04-09 NOTE — Telephone Encounter (Signed)
CMA call regarding to let patient know about is vascular appt   Patient sister was aware and understood

## 2017-04-10 LAB — RHEUMATOID FACTOR: RHEUMATOID FACTOR: 26.4 [IU]/mL — AB (ref 0.0–13.9)

## 2017-04-10 LAB — ANA: ANA: POSITIVE — AB

## 2017-04-11 ENCOUNTER — Telehealth: Payer: Self-pay

## 2017-04-11 NOTE — Telephone Encounter (Signed)
Called and spoke with Denna from [facility] regarding patient. Pt.seen in office on 04/09/17. Based on visit recommend [facility] skilled nursing and PT at this time. Was notified that PT and nursing were previously working with patient on charity basis in the past.

## 2017-04-11 NOTE — Telephone Encounter (Addendum)
Call placed to [facility] requesting to speak to the patient's PT to discuss his current status and home situation.  Spoke to Casimiro Needle who took message for Tinnie Gens, PT to return call to this CM or Arrie Senate, FNP # [phone].

## 2017-04-12 ENCOUNTER — Ambulatory Visit (HOSPITAL_COMMUNITY)
Admission: RE | Admit: 2017-04-12 | Discharge: 2017-04-12 | Disposition: A | Discharge status: Home / Self Care | Payer: Medicaid Other | Source: Ambulatory Visit | Attending: Family Medicine | Admitting: Family Medicine

## 2017-04-12 ENCOUNTER — Telehealth: Payer: Self-pay | Admitting: Family Medicine

## 2017-04-12 ENCOUNTER — Encounter: Payer: Self-pay | Admitting: Licensed Clinical Social Worker

## 2017-04-12 DIAGNOSIS — R600 Localized edema: Secondary | ICD-10-CM | POA: Insufficient documentation

## 2017-04-12 MED ORDER — INSULIN ASPART PROT & ASPART (70-30 MIX) 100 UNIT/ML ~~LOC~~ SUSP: 38.0000 [IU] | Freq: Two times a day (BID) | SUBCUTANEOUS | 3 refills | Status: DC

## 2017-04-12 MED FILL — !NOVOLOG MIX 70/30 VIAL: 70-30/ML | 13 days supply | Qty: 10 | Fill #0

## 2017-04-12 NOTE — Telephone Encounter (Signed)
Pt called stating he is completely out of insulin aspart protamine- aspart (NOVOLOG MIX 70/30) (70-30) 100 UNIT/ML injection  Pt states he does not have the hard copy and I spoke to [facility] pharmacy and they did not receive refills. Please f/up .
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
## 2017-04-12 NOTE — Telephone Encounter (Signed)
Sent insulin script electronically to [facility] pharmacy to fill today. Called patient to let him know, but he did not pick up and VM was full.

## 2017-04-12 NOTE — Progress Notes (Signed)
Bilateral lower extremity venous duplex has been completed. Negative for DVT. Results were given to [facility] FNP.  04/12/17 10:55 AM [name] RVT

## 2017-04-15 ENCOUNTER — Inpatient Hospital Stay (HOSPITAL_COMMUNITY)
Admission: EM | Admit: 2017-04-15 | Discharge: 2017-04-17 | DRG: 639 | Disposition: A | Discharge status: Home Health | Payer: Medicaid Other | Attending: Internal Medicine | Admitting: Internal Medicine

## 2017-04-15 ENCOUNTER — Encounter (HOSPITAL_COMMUNITY): Payer: Self-pay | Admitting: Emergency Medicine

## 2017-04-15 ENCOUNTER — Other Ambulatory Visit: Payer: Self-pay

## 2017-04-15 DIAGNOSIS — R112 Nausea with vomiting, unspecified: Secondary | ICD-10-CM | POA: Diagnosis present

## 2017-04-15 DIAGNOSIS — M6281 Muscle weakness (generalized): Secondary | ICD-10-CM | POA: Diagnosis present

## 2017-04-15 DIAGNOSIS — E1065 Type 1 diabetes mellitus with hyperglycemia: Secondary | ICD-10-CM | POA: Diagnosis present

## 2017-04-15 DIAGNOSIS — R109 Unspecified abdominal pain: Secondary | ICD-10-CM | POA: Diagnosis present

## 2017-04-15 DIAGNOSIS — Z955 Presence of coronary angioplasty implant and graft: Secondary | ICD-10-CM

## 2017-04-15 DIAGNOSIS — I69398 Other sequelae of cerebral infarction: Secondary | ICD-10-CM

## 2017-04-15 DIAGNOSIS — I10 Essential (primary) hypertension: Secondary | ICD-10-CM | POA: Diagnosis present

## 2017-04-15 DIAGNOSIS — I2510 Atherosclerotic heart disease of native coronary artery without angina pectoris: Secondary | ICD-10-CM | POA: Diagnosis present

## 2017-04-15 DIAGNOSIS — E1010 Type 1 diabetes mellitus with ketoacidosis without coma: Secondary | ICD-10-CM | POA: Diagnosis not present

## 2017-04-15 DIAGNOSIS — R600 Localized edema: Secondary | ICD-10-CM | POA: Diagnosis present

## 2017-04-15 DIAGNOSIS — R739 Hyperglycemia, unspecified: Secondary | ICD-10-CM | POA: Diagnosis present

## 2017-04-15 DIAGNOSIS — Y92009 Unspecified place in unspecified non-institutional (private) residence as the place of occurrence of the external cause: Secondary | ICD-10-CM

## 2017-04-15 DIAGNOSIS — E860 Dehydration: Secondary | ICD-10-CM | POA: Diagnosis present

## 2017-04-15 DIAGNOSIS — K219 Gastro-esophageal reflux disease without esophagitis: Secondary | ICD-10-CM | POA: Diagnosis present

## 2017-04-15 DIAGNOSIS — T383X6A Underdosing of insulin and oral hypoglycemic [antidiabetic] drugs, initial encounter: Secondary | ICD-10-CM | POA: Diagnosis present

## 2017-04-15 DIAGNOSIS — Z720 Tobacco use: Secondary | ICD-10-CM | POA: Diagnosis present

## 2017-04-15 DIAGNOSIS — Z794 Long term (current) use of insulin: Secondary | ICD-10-CM

## 2017-04-15 DIAGNOSIS — E1040 Type 1 diabetes mellitus with diabetic neuropathy, unspecified: Secondary | ICD-10-CM | POA: Diagnosis present

## 2017-04-15 DIAGNOSIS — S81801A Unspecified open wound, right lower leg, initial encounter: Secondary | ICD-10-CM | POA: Diagnosis present

## 2017-04-15 DIAGNOSIS — Z8673 Personal history of transient ischemic attack (TIA), and cerebral infarction without residual deficits: Secondary | ICD-10-CM | POA: Diagnosis present

## 2017-04-15 DIAGNOSIS — Z91138 Patient's unintentional underdosing of medication regimen for other reason: Secondary | ICD-10-CM

## 2017-04-15 DIAGNOSIS — F17200 Nicotine dependence, unspecified, uncomplicated: Secondary | ICD-10-CM | POA: Diagnosis present

## 2017-04-15 LAB — URINALYSIS, ROUTINE W REFLEX MICROSCOPIC
Bilirubin Urine: NEGATIVE
Glucose, UA: >=500 mg/dL — AB
Ketones, ur: 80 mg/dL — AB
Leukocytes, UA: NEGATIVE
Nitrite: NEGATIVE
Protein, ur: 100 mg/dL — AB
Specific Gravity, Urine: 1.027 (ref 1.005–1.030)
pH: 6 (ref 5.0–8.0)

## 2017-04-15 LAB — CBC
HCT: 39 % (ref 39.0–52.0)
Hemoglobin: 12.8 g/dL — ABNORMAL LOW (ref 13.0–17.0)
MCH: 30.8 pg (ref 26.0–34.0)
MCHC: 32.8 g/dL (ref 30.0–36.0)
MCV: 93.8 fL (ref 78.0–100.0)
Platelets: 222 10*3/uL (ref 150–400)
RBC: 4.16 MIL/uL — ABNORMAL LOW (ref 4.22–5.81)
RDW: 14.7 % (ref 11.5–15.5)
WBC: 10.9 10*3/uL — ABNORMAL HIGH (ref 4.0–10.5)

## 2017-04-15 LAB — CBG MONITORING, ED: Glucose-Capillary: 518 mg/dL (ref 65–99)

## 2017-04-15 MED ORDER — SODIUM CHLORIDE 0.9 % IV BOLUS (SEPSIS)
1000.0000 mL | Freq: Once | INTRAVENOUS | Status: AC
  Administered 2017-04-15: 1000 mL via INTRAVENOUS

## 2017-04-15 NOTE — ED Triage Notes (Signed)
Pt is out of his insulin since last Friday, unable to get it refill by PCP, pt now having abd pain, nausea and vomiting, hx of stroke with residual weakness on bilateral legs and incontinence. CBG 518 on triage.

## 2017-04-15 NOTE — ED Notes (Signed)
Pt. Here for novolog prescription refill.

## 2017-04-15 NOTE — ED Provider Notes (Signed)
[facility] EMERGENCY DEPARTMENT Provider Note   CSN: [id number] Arrival date & time: 04/15/17  2018     History   Chief Complaint Chief Complaint  Patient presents with  . Medication Refill    HPI Matthew Riley is a 41 y.o. male.  Patient with past medical history remarkable for type 1 diabetes, on insulin, hypertension, previous stroke, presents to the emergency department with a chief complaint of hyperglycemia.  He reports associated nausea, fatigue, and generalized weakness.  He states that he has been out of his insulin for 4 days.  He denies any fevers, chills, cough.  He also reports that he has noticed some bilateral lower extremity swelling.  He states that this is not new for him, but is worse than normal.  But there are no modifying factors.      Past Medical History:  Diagnosis Date  . Diabetes mellitus without complication ([place])   . Hypertension   . Renal disorder   . Stroke [facility])     Patient Active Problem List   Diagnosis Date Noted  . Uncontrolled type 1 diabetes mellitus with hyperglycemia ([place])   . Neuropathy   . DKA, type 1 ([place]) 03/08/2017  . Nausea & vomiting 03/08/2017  . GERD (gastroesophageal reflux disease) 03/08/2017  . Hyponatremia 03/08/2017  . AKI (acute kidney injury) ([place]) 03/08/2017  . Hyperkalemia 03/08/2017  . Diabetic ketoacidosis without coma associated with diabetes mellitus due to underlying condition [facility])     Past Surgical History:  Procedure Laterality Date  . CORONARY ANGIOPLASTY WITH STENT PLACEMENT         Home Medications    Prior to Admission medications   Medication Sig Start Date End Date Taking? Authorizing Provider  cephALEXin (KEFLEX) 500 MG capsule Take 1 capsule (500 mg total) 4 (four) times daily by mouth. 04/09/17  Yes Hairston, Maylon Peppers, FNP  cyclobenzaprine (FLEXERIL) 10 MG tablet Take 0.5 tablets (5 mg total) 3 (three) times daily as needed by mouth for muscle spasms. 04/09/17   Yes Hairston, Maylon Peppers, FNP  furosemide (LASIX) 20 MG tablet Take 1 tablet (20 mg total) by mouth daily. 03/22/17  Yes Ena Dawley, Tiffany S, PA-C  gabapentin (NEURONTIN) 100 MG capsule Take 2 capsules (200 mg total) by mouth 3 (three) times daily. 03/12/17  Yes Barton Dubois, MD  ibuprofen (ADVIL,MOTRIN) 800 MG tablet Take 1 tablet (800 mg total) every 8 (eight) hours as needed by mouth for moderate pain (Take with food). 04/09/17  Yes Hairston, Mandesia R, FNP  insulin aspart (NOVOLOG) 100 UNIT/ML injection Inject 5 Units into the skin 3 (three) times daily with meals. 03/12/17  Yes Barton Dubois, MD  insulin aspart protamine- aspart (NOVOLOG MIX 70/30) (70-30) 100 UNIT/ML injection Inject 0.38 mLs (38 Units total) 2 (two) times daily with a meal into the skin. 04/12/17  Yes Hairston, Maylon Peppers, FNP  Insulin Syringe-Needle U-100 (INSULIN SYRINGE .5CC/31GX5/16") 31G X 5/16" 0.5 ML MISC Use as needed to inject insulin as instructed 03/12/17  Yes Barton Dubois, MD  lisinopril (PRINIVIL,ZESTRIL) 20 MG tablet Take 1 tablet (20 mg total) by mouth 2 (two) times daily. 03/22/17  Yes Ena Dawley, Tiffany S, PA-C  oxyCODONE-acetaminophen (ROXICET) 5-325 MG tablet Take 1 tablet by mouth every 8 (eight) hours as needed for severe pain. 03/22/17  Yes Ena Dawley, Tiffany S, PA-C  pantoprazole (PROTONIX) 40 MG tablet Take 1 tablet (40 mg total) by mouth daily. 03/13/17  Yes Barton Dubois, MD  promethazine (PHENERGAN) 25 MG tablet Take  1 tablet (25 mg total) by mouth every 8 (eight) hours as needed for nausea, vomiting or refractory nausea / vomiting. 03/22/17  Yes Ena Dawley, Tiffany S, PA-C  traMADol (ULTRAM) 50 MG tablet Take 1 tablet (50 mg total) by mouth every 6 (six) hours as needed for severe pain. 03/22/17  Yes Lawyer, Harrell Gave, PA-C  bacitracin 500 UNIT/GM ointment Apply 1 application 2 (two) times daily topically. For 7 days. Patient not taking: Reported on 04/15/2017 04/09/17   Alfonse Spruce, FNP  blood glucose meter  kit and supplies Dispense based on patient and insurance preference. Use up to four times daily as directed. (FOR ICD-9 250.00, 250.01). 03/12/17   Barton Dubois, MD    Family History Family History  Family history unknown: Yes    Social History Social History   Tobacco Use  . Smoking status: Current Some Day Smoker  . Smokeless tobacco: Never Used  Substance Use Topics  . Alcohol use: Yes  . Drug use: No     Allergies   Patient has no known allergies.   Review of Systems Review of Systems  All other systems reviewed and are negative.    Physical Exam Updated Vital Signs BP (!) 139/91 (BP Location: Left Arm)   Pulse (!) 112   Temp 98 F (36.7 C) (Oral)   Resp 18   Ht '6\' 1"'  (1.854 m)   Wt 94.8 kg (209 lb)   SpO2 100%   BMI 27.57 kg/m   Physical Exam  Constitutional: He is oriented to person, place, and time. He appears well-developed and well-nourished.  HENT:  Head: Normocephalic and atraumatic.  Eyes: Conjunctivae and EOM are normal. Pupils are equal, round, and reactive to light. Right eye exhibits no discharge. Left eye exhibits no discharge. No scleral icterus.  Neck: Normal range of motion. Neck supple. No JVD present.  Cardiovascular: Normal rate, regular rhythm and normal heart sounds. Exam reveals no gallop and no friction rub.  No murmur heard. Pulmonary/Chest: Effort normal and breath sounds normal. No respiratory distress. He has no wheezes. He has no rales. He exhibits no tenderness.  Abdominal: Soft. He exhibits no distension and no mass. There is no tenderness. There is no rebound and no guarding.  Musculoskeletal: Normal range of motion. He exhibits no edema or tenderness.  Neurological: He is alert and oriented to person, place, and time.  Skin: Skin is warm and dry.  Psychiatric: He has a normal mood and affect. His behavior is normal. Judgment and thought content normal.  Nursing note and vitals reviewed.    ED Treatments / Results   Labs (all labs ordered are listed, but only abnormal results are displayed) Labs Reviewed  CBC - Abnormal; Notable for the following components:      Result Value   WBC 10.9 (*)    RBC 4.16 (*)    Hemoglobin 12.8 (*)    All other components within normal limits  URINALYSIS, ROUTINE W REFLEX MICROSCOPIC - Abnormal; Notable for the following components:   Glucose, UA >=500 (*)    Hgb urine dipstick SMALL (*)    Ketones, ur 80 (*)    Protein, ur 100 (*)    Bacteria, UA RARE (*)    Squamous Epithelial / LPF 0-5 (*)    All other components within normal limits  CBG MONITORING, ED - Abnormal; Notable for the following components:   Glucose-Capillary 518 (*)    All other components within normal limits  CBG MONITORING, ED - Abnormal;  Notable for the following components:   Glucose-Capillary >600 (*)    All other components within normal limits  I-STAT CHEM 8, ED - Abnormal; Notable for the following components:   Sodium 131 (*)    BUN 26 (*)    Glucose, Bld 675 (*)    TCO2 13 (*)    All other components within normal limits    EKG  EKG Interpretation None       Radiology No results found.  Procedures Procedures (including critical care time) CRITICAL CARE Performed by: Montine Circle   Total critical care time: 47 minutes  Critical care time was exclusive of separately billable procedures and treating other patients.  Critical care was necessary to treat or prevent imminent or life-threatening deterioration.  Critical care was time spent personally by me on the following activities: development of treatment plan with patient and/or surrogate as well as nursing, discussions with consultants, evaluation of patient's response to treatment, examination of patient, obtaining history from patient or surrogate, ordering and performing treatments and interventions, ordering and review of laboratory studies, ordering and review of radiographic studies, pulse oximetry and  re-evaluation of patient's condition.  Medications Ordered in ED Medications  sodium chloride 0.9 % bolus 1,000 mL (not administered)     Initial Impression / Assessment and Plan / ED Course  I have reviewed the triage vital signs and the nursing notes.  Pertinent labs & imaging results that were available during my care of the patient were reviewed by me and considered in my medical decision making (see chart for details).     Patient with type 1 diabetes.  He has been out of his insulin for the past 4 days.  He presents with hypoglycemia.  Glucose greater than 600.  Electrolytes are pending.  Patient is mildly tachycardic at 112.  He does have 80 ketones in his urine.  Could be early DKA vs uncomplicated hyperglycemia.  Anion gap is currently unavailable, but we will continue to monitor.  We will give some fluids.  Plan to start insulin after electrolytes result.  Patient will likely need admission to the hospital.  He will be an unassigned admission, [facility].  Anion gap is 17 calculated off of i-STAT Chem-8.  Glucose is 675.  Will start insulin drip.  Will consult for admission.  Final Clinical Impressions(s) / ED Diagnoses   Final diagnoses:  Diabetic ketoacidosis without coma associated with type 1 diabetes mellitus [facility])    ED Discharge Orders    None       Montine Circle, PA-C 04/16/17 0123    Virgel Manifold, MD 04/19/17 [phone]

## 2017-04-15 NOTE — ED Notes (Signed)
Pt's CBG result read "HI". Informed Sue Lush - RN.

## 2017-04-16 ENCOUNTER — Encounter: Payer: Self-pay | Admitting: Licensed Clinical Social Worker

## 2017-04-16 ENCOUNTER — Telehealth: Payer: Self-pay

## 2017-04-16 ENCOUNTER — Encounter (HOSPITAL_COMMUNITY): Payer: Self-pay | Admitting: Internal Medicine

## 2017-04-16 DIAGNOSIS — I69398 Other sequelae of cerebral infarction: Secondary | ICD-10-CM | POA: Diagnosis not present

## 2017-04-16 DIAGNOSIS — I10 Essential (primary) hypertension: Secondary | ICD-10-CM | POA: Diagnosis present

## 2017-04-16 DIAGNOSIS — Z8673 Personal history of transient ischemic attack (TIA), and cerebral infarction without residual deficits: Secondary | ICD-10-CM | POA: Diagnosis present

## 2017-04-16 DIAGNOSIS — K219 Gastro-esophageal reflux disease without esophagitis: Secondary | ICD-10-CM

## 2017-04-16 DIAGNOSIS — Z91138 Patient's unintentional underdosing of medication regimen for other reason: Secondary | ICD-10-CM | POA: Diagnosis not present

## 2017-04-16 DIAGNOSIS — R109 Unspecified abdominal pain: Secondary | ICD-10-CM | POA: Diagnosis present

## 2017-04-16 DIAGNOSIS — E1040 Type 1 diabetes mellitus with diabetic neuropathy, unspecified: Secondary | ICD-10-CM | POA: Diagnosis present

## 2017-04-16 DIAGNOSIS — Z720 Tobacco use: Secondary | ICD-10-CM

## 2017-04-16 DIAGNOSIS — Y92009 Unspecified place in unspecified non-institutional (private) residence as the place of occurrence of the external cause: Secondary | ICD-10-CM | POA: Diagnosis not present

## 2017-04-16 DIAGNOSIS — Z794 Long term (current) use of insulin: Secondary | ICD-10-CM | POA: Diagnosis not present

## 2017-04-16 DIAGNOSIS — I2510 Atherosclerotic heart disease of native coronary artery without angina pectoris: Secondary | ICD-10-CM | POA: Diagnosis present

## 2017-04-16 DIAGNOSIS — E1010 Type 1 diabetes mellitus with ketoacidosis without coma: Secondary | ICD-10-CM | POA: Diagnosis not present

## 2017-04-16 DIAGNOSIS — R600 Localized edema: Secondary | ICD-10-CM | POA: Diagnosis present

## 2017-04-16 DIAGNOSIS — M6281 Muscle weakness (generalized): Secondary | ICD-10-CM | POA: Diagnosis present

## 2017-04-16 DIAGNOSIS — T383X6A Underdosing of insulin and oral hypoglycemic [antidiabetic] drugs, initial encounter: Secondary | ICD-10-CM | POA: Diagnosis present

## 2017-04-16 DIAGNOSIS — S81801A Unspecified open wound, right lower leg, initial encounter: Secondary | ICD-10-CM | POA: Diagnosis present

## 2017-04-16 DIAGNOSIS — F17200 Nicotine dependence, unspecified, uncomplicated: Secondary | ICD-10-CM | POA: Diagnosis present

## 2017-04-16 DIAGNOSIS — E860 Dehydration: Secondary | ICD-10-CM | POA: Diagnosis present

## 2017-04-16 DIAGNOSIS — R739 Hyperglycemia, unspecified: Secondary | ICD-10-CM | POA: Diagnosis present

## 2017-04-16 DIAGNOSIS — Z955 Presence of coronary angioplasty implant and graft: Secondary | ICD-10-CM | POA: Diagnosis not present

## 2017-04-16 LAB — BASIC METABOLIC PANEL
ANION GAP: 19 — AB (ref 5–15)
ANION GAP: 6 (ref 5–15)
ANION GAP: 6 (ref 5–15)
ANION GAP: 5 (ref 5–15)
BUN: 24 mg/dL — AB (ref 6–20)
BUN: 17 mg/dL (ref 6–20)
BUN: 15 mg/dL (ref 6–20)
BUN: 14 mg/dL (ref 6–20)
CALCIUM: 8.4 mg/dL — AB (ref 8.9–10.3)
CHLORIDE: 107 mmol/L (ref 101–111)
CHLORIDE: 102 mmol/L (ref 101–111)
CHLORIDE: 105 mmol/L (ref 101–111)
CO2: 8 mmol/L — AB (ref 22–32)
CO2: 21 mmol/L — AB (ref 22–32)
CO2: 24 mmol/L (ref 22–32)
CO2: 24 mmol/L (ref 22–32)
CREATININE: 1.11 mg/dL (ref 0.61–1.24)
CREATININE: 1 mg/dL (ref 0.61–1.24)
Calcium: 8.1 mg/dL — ABNORMAL LOW (ref 8.9–10.3)
Calcium: 8.3 mg/dL — ABNORMAL LOW (ref 8.9–10.3)
Calcium: 8.4 mg/dL — ABNORMAL LOW (ref 8.9–10.3)
Chloride: 104 mmol/L (ref 101–111)
Creatinine, Ser: 1.62 mg/dL — ABNORMAL HIGH (ref 0.61–1.24)
Creatinine, Ser: 1.02 mg/dL (ref 0.61–1.24)
GFR calc Af Amer: 59 mL/min — ABNORMAL LOW (ref >60)
GFR calc Af Amer: >60 mL/min (ref >60)
GFR calc non Af Amer: 51 mL/min — ABNORMAL LOW (ref >60)
GFR calc non Af Amer: >60 mL/min (ref >60)
GFR calc non Af Amer: >60 mL/min (ref >60)
GFR calc non Af Amer: >60 mL/min (ref >60)
GLUCOSE: 533 mg/dL — AB (ref 65–99)
GLUCOSE: 126 mg/dL — AB (ref 65–99)
Glucose, Bld: 154 mg/dL — ABNORMAL HIGH (ref 65–99)
Glucose, Bld: 106 mg/dL — ABNORMAL HIGH (ref 65–99)
POTASSIUM: 4.4 mmol/L (ref 3.5–5.1)
POTASSIUM: 3.4 mmol/L — AB (ref 3.5–5.1)
POTASSIUM: 4.3 mmol/L (ref 3.5–5.1)
POTASSIUM: 3.5 mmol/L (ref 3.5–5.1)
SODIUM: 132 mmol/L — AB (ref 135–145)
SODIUM: 134 mmol/L — AB (ref 135–145)
Sodium: 131 mmol/L — ABNORMAL LOW (ref 135–145)
Sodium: 134 mmol/L — ABNORMAL LOW (ref 135–145)

## 2017-04-16 LAB — I-STAT CHEM 8, ED
BUN: 26 mg/dL — AB (ref 6–20)
CALCIUM ION: 1.16 mmol/L (ref 1.15–1.40)
CREATININE: 0.8 mg/dL (ref 0.61–1.24)
Chloride: 101 mmol/L (ref 101–111)
Glucose, Bld: 675 mg/dL (ref 65–99)
HEMATOCRIT: 42 % (ref 39.0–52.0)
HEMOGLOBIN: 14.3 g/dL (ref 13.0–17.0)
Potassium: 5.1 mmol/L (ref 3.5–5.1)
Sodium: 131 mmol/L — ABNORMAL LOW (ref 135–145)
TCO2: 13 mmol/L — AB (ref 22–32)

## 2017-04-16 LAB — CBG MONITORING, ED
GLUCOSE-CAPILLARY: 542 mg/dL — AB (ref 65–99)
GLUCOSE-CAPILLARY: 249 mg/dL — AB (ref 65–99)
GLUCOSE-CAPILLARY: 117 mg/dL — AB (ref 65–99)
GLUCOSE-CAPILLARY: 224 mg/dL — AB (ref 65–99)
Glucose-Capillary: 548 mg/dL (ref 65–99)
Glucose-Capillary: 458 mg/dL — ABNORMAL HIGH (ref 65–99)
Glucose-Capillary: 412 mg/dL — ABNORMAL HIGH (ref 65–99)
Glucose-Capillary: 271 mg/dL — ABNORMAL HIGH (ref 65–99)
Glucose-Capillary: 183 mg/dL — ABNORMAL HIGH (ref 65–99)
Glucose-Capillary: 133 mg/dL — ABNORMAL HIGH (ref 65–99)
Glucose-Capillary: 104 mg/dL — ABNORMAL HIGH (ref 65–99)
Glucose-Capillary: 147 mg/dL — ABNORMAL HIGH (ref 65–99)
Glucose-Capillary: 139 mg/dL — ABNORMAL HIGH (ref 65–99)

## 2017-04-16 LAB — BRAIN NATRIURETIC PEPTIDE: B NATRIURETIC PEPTIDE 5: 28.3 pg/mL (ref 0.0–100.0)

## 2017-04-16 LAB — LIPASE, BLOOD: Lipase: 18 U/L (ref 11–51)

## 2017-04-16 LAB — GLUCOSE, CAPILLARY: Glucose-Capillary: 111 mg/dL — ABNORMAL HIGH (ref 65–99)

## 2017-04-16 MED ORDER — INSULIN ASPART 100 UNIT/ML ~~LOC~~ SOLN: 0.0000 [IU] | Freq: Every day | SUBCUTANEOUS | Status: DC

## 2017-04-16 MED ORDER — INSULIN ASPART 100 UNIT/ML ~~LOC~~ SOLN
0.0000 [IU] | Freq: Three times a day (TID) | SUBCUTANEOUS | Status: DC
  Administered 2017-04-16: 2 [IU] via SUBCUTANEOUS
  Administered 2017-04-17: 3 [IU] via SUBCUTANEOUS
  Administered 2017-04-17: 8 [IU] via SUBCUTANEOUS
  Filled 2017-04-16: qty 1

## 2017-04-16 MED ORDER — SODIUM CHLORIDE 0.9 % IV BOLUS (SEPSIS)
500.0000 mL | Freq: Once | INTRAVENOUS | Status: AC
  Administered 2017-04-16: 500 mL via INTRAVENOUS

## 2017-04-16 MED ORDER — SODIUM CHLORIDE 0.9 % IV SOLN
INTRAVENOUS | Status: DC
  Administered 2017-04-16: 01:00:00 via INTRAVENOUS

## 2017-04-16 MED ORDER — SODIUM CHLORIDE 0.9 % IV SOLN
INTRAVENOUS | Status: DC
  Administered 2017-04-16: 4.9 [IU]/h via INTRAVENOUS
  Filled 2017-04-16: qty 1

## 2017-04-16 MED ORDER — INSULIN ASPART PROT & ASPART (70-30 MIX) 100 UNIT/ML ~~LOC~~ SUSP
38.0000 [IU] | Freq: Two times a day (BID) | SUBCUTANEOUS | Status: DC
  Administered 2017-04-16 – 2017-04-17 (×2): 38 [IU] via SUBCUTANEOUS
  Filled 2017-04-16: qty 10

## 2017-04-16 MED ORDER — CYCLOBENZAPRINE HCL 5 MG PO TABS: 5.0000 mg | ORAL_TABLET | Freq: Three times a day (TID) | ORAL | Status: DC | PRN

## 2017-04-16 MED ORDER — ASPIRIN EC 325 MG PO TBEC
325.0000 mg | DELAYED_RELEASE_TABLET | Freq: Every day | ORAL | Status: DC
  Administered 2017-04-16 – 2017-04-17 (×2): 325 mg via ORAL
  Filled 2017-04-16 (×2): qty 1

## 2017-04-16 MED ORDER — SODIUM CHLORIDE 0.9 % IV BOLUS (SEPSIS)
1000.0000 mL | Freq: Once | INTRAVENOUS | Status: AC
  Administered 2017-04-16: 1000 mL via INTRAVENOUS

## 2017-04-16 MED ORDER — GABAPENTIN 100 MG PO CAPS
200.0000 mg | ORAL_CAPSULE | Freq: Three times a day (TID) | ORAL | Status: DC
  Administered 2017-04-16 – 2017-04-17 (×5): 200 mg via ORAL
  Filled 2017-04-16 (×5): qty 2

## 2017-04-16 MED ORDER — SODIUM CHLORIDE 0.9 % IV BOLUS (SEPSIS): 1000.0000 mL | Freq: Once | INTRAVENOUS | Status: DC

## 2017-04-16 MED ORDER — SODIUM CHLORIDE 0.9 % IV SOLN: INTRAVENOUS | Status: DC

## 2017-04-16 MED ORDER — ACETAMINOPHEN 325 MG PO TABS: 650.0000 mg | ORAL_TABLET | Freq: Four times a day (QID) | ORAL | Status: DC | PRN

## 2017-04-16 MED ORDER — LISINOPRIL 20 MG PO TABS
20.0000 mg | ORAL_TABLET | Freq: Two times a day (BID) | ORAL | Status: DC
Start: 2017-04-16 — End: 2017-04-17
  Administered 2017-04-16 – 2017-04-17 (×3): 20 mg via ORAL
  Filled 2017-04-16 (×3): qty 1

## 2017-04-16 MED ORDER — NICOTINE 21 MG/24HR TD PT24
21.0000 mg | MEDICATED_PATCH | Freq: Every day | TRANSDERMAL | Status: DC
  Filled 2017-04-16 (×2): qty 1

## 2017-04-16 MED ORDER — DEXTROSE-NACL 5-0.45 % IV SOLN: INTRAVENOUS | Status: DC

## 2017-04-16 MED ORDER — PROMETHAZINE HCL 25 MG PO TABS
25.0000 mg | ORAL_TABLET | Freq: Three times a day (TID) | ORAL | Status: DC | PRN
  Administered 2017-04-17: 25 mg via ORAL
  Filled 2017-04-16: qty 1

## 2017-04-16 MED ORDER — OXYCODONE-ACETAMINOPHEN 5-325 MG PO TABS
1.0000 | ORAL_TABLET | Freq: Four times a day (QID) | ORAL | Status: DC | PRN
  Administered 2017-04-16: 1 via ORAL
  Filled 2017-04-16: qty 1

## 2017-04-16 MED ORDER — NITROGLYCERIN 0.4 MG SL SUBL: 0.4000 mg | SUBLINGUAL_TABLET | SUBLINGUAL | Status: DC | PRN

## 2017-04-16 MED ORDER — ENOXAPARIN SODIUM 40 MG/0.4ML ~~LOC~~ SOLN
40.0000 mg | SUBCUTANEOUS | Status: DC
  Administered 2017-04-16 – 2017-04-17 (×2): 40 mg via SUBCUTANEOUS
  Filled 2017-04-16 (×3): qty 0.4

## 2017-04-16 MED ORDER — ZOLPIDEM TARTRATE 5 MG PO TABS: 5.0000 mg | ORAL_TABLET | Freq: Every evening | ORAL | Status: DC | PRN

## 2017-04-16 MED ORDER — IBUPROFEN 800 MG PO TABS: 800.0000 mg | ORAL_TABLET | Freq: Three times a day (TID) | ORAL | Status: DC | PRN

## 2017-04-16 MED ORDER — INSULIN REGULAR HUMAN 100 UNIT/ML IJ SOLN
INTRAMUSCULAR | Status: DC
  Filled 2017-04-16: qty 1

## 2017-04-16 MED ORDER — HYDRALAZINE HCL 20 MG/ML IJ SOLN: 5.0000 mg | INTRAMUSCULAR | Status: DC | PRN

## 2017-04-16 MED ORDER — DEXTROSE-NACL 5-0.45 % IV SOLN
INTRAVENOUS | Status: DC
  Administered 2017-04-16: 07:00:00 via INTRAVENOUS

## 2017-04-16 MED ORDER — PANTOPRAZOLE SODIUM 40 MG PO TBEC
40.0000 mg | DELAYED_RELEASE_TABLET | Freq: Every day | ORAL | Status: DC
Start: 2017-04-16 — End: 2017-04-17
  Administered 2017-04-16 – 2017-04-17 (×2): 40 mg via ORAL
  Filled 2017-04-16 (×2): qty 1

## 2017-04-16 NOTE — Progress Notes (Signed)
Patient is a 41 y/o that ran out of his insulin regimen and presented with DKA. Placed on glucostabilizer (insulin gtt) then I have transitioned to SQ insulin regimen. He is requesting food and I will advance his diet.  Fluids can be discontinued once patient is off insulin gtt. Discussed with nursing  Gen: pt in nad, alert and awake CV: no cyanosis Pulm: no increased wob, no wheezes  Will transfer or admit to Med surg floor  Anie Juniel, [employer]

## 2017-04-16 NOTE — ED Notes (Signed)
Attempted report 

## 2017-04-16 NOTE — Telephone Encounter (Addendum)
Call placed to [facility] and spoke to Granger, PT about the patient's status. Trey Paula stated that has last saw the patient about a week ago and has seen the patient for a total of 4 times. He noted that that he has tried to contact the patient 3 times during the past week;but has not been able to reach him.  He said he tried to meet with the patient and have his sister present during the meeting but she has not complied with the request. He noted that he has tried to meet with her during her days off but that has not been successful.  He did report that the patient has experienced several falls but has been been instructed regarding safe ambulation with his RW. Trey Paula also noted that the patient is able to safely negotiate stairs without a RW but despite teaching, has tried to navigate the stairs using his RW.   He reported that the house is clean and there is enough food in the home for the patient.

## 2017-04-16 NOTE — ED Notes (Signed)
Dinner tray ordered.

## 2017-04-16 NOTE — ED Notes (Signed)
CBG 117 

## 2017-04-16 NOTE — ED Notes (Signed)
Placed pt on bed pan.
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
## 2017-04-16 NOTE — ED Notes (Signed)
CBG 183 

## 2017-04-16 NOTE — ED Notes (Signed)
IV team at bedside 

## 2017-04-16 NOTE — H&P (Addendum)
History and Physical    Matthew Riley ZOX:[id number] DOB: [birth year] DOA: 04/15/2017  Referring MD/NP/PA:   PCP: Alfonse Spruce, FNP   Patient coming from:  The patient is coming from home.  At baseline, pt is independent for most of ADL.   Chief Complaint: generalized weakness, hyperglycemia, nausea, vomiting and abdominal pain  HPI: Matthew Riley is a 41 y.o. male with medical history significant of type 1 diabetes, hypertension, stroke, GERD, CAD, s/p of stent placement, tobacco abuse, who presents with generalized weakness, hyperglycemia, nausea vomiting and abdominal pain  Pt states that he ran out of his insulin since last Friday and is unable to get it refill. His blood sugar is elevated, 580 on triage. Patient states that he he developed generalized weakness, nausea, vomiting and abdominal pain. He vomited twice today. Denies diarrhea. His abdominal pain is mild, located in the epigastric area, constant, nonradiating. Patient does not have chest pain, shortness breath, fever chills, cough, symptoms of UTI. He has  Chronic bilateral leg weakness from previous stroke. Patient has bilateral leg edema without clear diagnosis. He had lower extremity venous Doppler on 04/12/17 which was negative for DVT. Pt has been taking  Labs since 04/10/17, but not sure why he is taking this medications. He states that [facility] and probably for right lower leg wound.   ED Course: pt was found to have DKA with AG of 17,  Bicarbonate is 13, blood sugar 675,  WBC 10.9, negative urinalysis, creatinine normal, temperature normal, tachycardia, no tachypnea, oxygen saturation 99% on room air. Patient is admitted to stepdown as inpatient.  Review of Systems:   General: no fevers, chills, no body weight gain, has poor appetite, has fatigue HEENT: no blurry vision, hearing changes or sore throat Respiratory: no dyspnea, coughing, wheezing CV: no chest pain, no  palpitations GI: has nausea, vomiting, abdominal pain, no diarrhea, constipation GU: no dysuria, burning on urination, increased urinary frequency, hematuria  Ext: has leg edema Neuro: no unilateral weakness, numbness, or tingling, no vision change or hearing loss Skin: has skin wound in right lower leg MSK: No muscle spasm, no deformity, no limitation of range of movement in spin Heme: No easy bruising.  Travel history: No recent long distant travel.  Allergy: No Known Allergies  Past Medical History:  Diagnosis Date  . Diabetes mellitus without complication ([place])   . Hypertension   . Renal disorder   . Stroke [facility])     Past Surgical History:  Procedure Laterality Date  . CORONARY ANGIOPLASTY WITH STENT PLACEMENT      Social History:  reports that he has been smoking.  he has never used smokeless tobacco. He reports that he drinks alcohol. He reports that he does not use drugs.  Family History:  Family History  Family history unknown: Yes     Prior to Admission medications   Medication Sig Start Date End Date Taking? Authorizing Provider  cephALEXin (KEFLEX) 500 MG capsule Take 1 capsule (500 mg total) 4 (four) times daily by mouth. 04/09/17  Yes Hairston, Maylon Peppers, FNP  cyclobenzaprine (FLEXERIL) 10 MG tablet Take 0.5 tablets (5 mg total) 3 (three) times daily as needed by mouth for muscle spasms. 04/09/17  Yes Hairston, Maylon Peppers, FNP  furosemide (LASIX) 20 MG tablet Take 1 tablet (20 mg total) by mouth daily. 03/22/17  Yes Ena Dawley, Tiffany S, PA-C  gabapentin (NEURONTIN) 100 MG capsule Take 2 capsules (200 mg total) by mouth 3 (three) times daily.  03/12/17  Yes Barton Dubois, MD  ibuprofen (ADVIL,MOTRIN) 800 MG tablet Take 1 tablet (800 mg total) every 8 (eight) hours as needed by mouth for moderate pain (Take with food). 04/09/17  Yes Hairston, Mandesia R, FNP  insulin aspart (NOVOLOG) 100 UNIT/ML injection Inject 5 Units into the skin 3 (three) times daily with meals.  03/12/17  Yes Barton Dubois, MD  insulin aspart protamine- aspart (NOVOLOG MIX 70/30) (70-30) 100 UNIT/ML injection Inject 0.38 mLs (38 Units total) 2 (two) times daily with a meal into the skin. 04/12/17  Yes Hairston, Maylon Peppers, FNP  Insulin Syringe-Needle U-100 (INSULIN SYRINGE .5CC/31GX5/16") 31G X 5/16" 0.5 ML MISC Use as needed to inject insulin as instructed 03/12/17  Yes Barton Dubois, MD  lisinopril (PRINIVIL,ZESTRIL) 20 MG tablet Take 1 tablet (20 mg total) by mouth 2 (two) times daily. 03/22/17  Yes Ena Dawley, Tiffany S, PA-C  oxyCODONE-acetaminophen (ROXICET) 5-325 MG tablet Take 1 tablet by mouth every 8 (eight) hours as needed for severe pain. 03/22/17  Yes Ena Dawley, Tiffany S, PA-C  pantoprazole (PROTONIX) 40 MG tablet Take 1 tablet (40 mg total) by mouth daily. 03/13/17  Yes Barton Dubois, MD  promethazine (PHENERGAN) 25 MG tablet Take 1 tablet (25 mg total) by mouth every 8 (eight) hours as needed for nausea, vomiting or refractory nausea / vomiting. 03/22/17  Yes Ena Dawley, Tiffany S, PA-C  traMADol (ULTRAM) 50 MG tablet Take 1 tablet (50 mg total) by mouth every 6 (six) hours as needed for severe pain. 03/22/17  Yes Lawyer, Harrell Gave, PA-C  bacitracin 500 UNIT/GM ointment Apply 1 application 2 (two) times daily topically. For 7 days. Patient not taking: Reported on 04/15/2017 04/09/17   Alfonse Spruce, FNP  blood glucose meter kit and supplies Dispense based on patient and insurance preference. Use up to four times daily as directed. (FOR ICD-9 250.00, 250.01). 03/12/17   Barton Dubois, MD    Physical Exam: Vitals:   04/16/17 0000 04/16/17 0045 04/16/17 0100 04/16/17 0145  BP: 120/83 123/80 108/68 105/69  Pulse: (!) 105  100 100  Resp:      Temp:      TempSrc:      SpO2: 98%  99% 100%  Weight:      Height:       General: Not in acute distress HEENT:       Eyes: PERRL, EOMI, no scleral icterus.       ENT: No discharge from the ears and nose, no pharynx injection, no tonsillar  enlargement.        Neck: No JVD, no bruit, no mass felt. Heme: No neck lymph node enlargement. Cardiac: S1/S2, RRR, No murmurs, No gallops or rubs. Respiratory: o rales, wheezing, rhonchi or rubs. GI: Soft, nondistended, has mild tenderness in epigastric area, no rebound pain, no organomegaly, BS present. GU: No hematuria Ext: 2+ pitting leg edema bilaterally. 2+DP/PT pulse bilaterally. Musculoskeletal: No joint deformities, No joint redness or warmth, no limitation of ROM in spin. Skin: has skin wound in right lower leg, which is crusted without active draining or erythema in surrounding. Neuro: Alert, oriented X3, cranial nerves II-XII grossly intact, moves all extremities normally.  Psych: Patient is not psychotic, no suicidal or hemocidal ideation.  Labs on Admission: I have personally reviewed following labs and imaging studies  CBC: Recent Labs  Lab 04/15/17 2041 04/16/17 0059  WBC 10.9*  --   HGB 12.8* 14.3  HCT 39.0 42.0  MCV 93.8  --   PLT 222  --  Basic Metabolic Panel: Recent Labs  Lab 04/16/17 0059 04/16/17 0239  NA 131* 131*  K 5.1 4.4  CL 101 104  CO2  --  8*  GLUCOSE 675* 533*  BUN 26* 24*  CREATININE 0.80 1.62*  CALCIUM  --  8.1*   GFR: Estimated Creatinine Clearance: 67.1 mL/min (A) (by C-G formula based on SCr of 1.62 mg/dL (H)). Liver Function Tests: No results for input(s): AST, ALT, ALKPHOS, BILITOT, PROT, ALBUMIN in the last 168 hours. Recent Labs  Lab 04/16/17 0239  LIPASE 18   No results for input(s): AMMONIA in the last 168 hours. Coagulation Profile: No results for input(s): INR, PROTIME in the last 168 hours. Cardiac Enzymes: No results for input(s): CKTOTAL, CKMB, CKMBINDEX, TROPONINI in the last 168 hours. BNP (last 3 results) No results for input(s): PROBNP in the last 8760 hours. HbA1C: No results for input(s): HGBA1C in the last 72 hours. CBG: Recent Labs  Lab 04/15/17 2028 04/15/17 2235 04/16/17 0141 04/16/17 0255  04/16/17 0400  GLUCAP 518* >600* 548* 542* 458*   Lipid Profile: No results for input(s): CHOL, HDL, LDLCALC, TRIG, CHOLHDL, LDLDIRECT in the last 72 hours. Thyroid Function Tests: No results for input(s): TSH, T4TOTAL, FREET4, T3FREE, THYROIDAB in the last 72 hours. Anemia Panel: No results for input(s): VITAMINB12, FOLATE, FERRITIN, TIBC, IRON, RETICCTPCT in the last 72 hours. Urine analysis:    Component Value Date/Time   COLORURINE YELLOW 04/15/2017 2052   APPEARANCEUR CLEAR 04/15/2017 2052   LABSPEC 1.027 04/15/2017 2052   PHURINE 6.0 04/15/2017 2052   GLUCOSEU >=500 (A) 04/15/2017 2052   HGBUR SMALL (A) 04/15/2017 2052   BILIRUBINUR NEGATIVE 04/15/2017 2052   KETONESUR 80 (A) 04/15/2017 2052   PROTEINUR 100 (A) 04/15/2017 2052   NITRITE NEGATIVE 04/15/2017 2052   LEUKOCYTESUR NEGATIVE 04/15/2017 2052   Sepsis Labs: '@LABRCNTIP' (procalcitonin:4,lacticidven:4) )No results found for this or any previous visit (from the past 240 hour(s)).   Radiological Exams on Admission: No results found.   EKG: Independently reviewed.  Sinus rhythm, QTC 475, anteroseptal infarction pattern    Assessment/Plan Principal Problem:   DKA, type 1 (HCC) Active Problems:   Nausea & vomiting   GERD (gastroesophageal reflux disease)   Uncontrolled type 1 diabetes mellitus with hyperglycemia (HCC)   Hypertension   Abdominal pain   Tobacco abuse   Leg wound, right   Bilateral leg edema   Stroke (cerebrum) (HCC)   CAD (coronary artery disease)  DKA, type I: AG of 17,  Bicarbonate is 13, blood sugar 675. Due to noncompliance taking insulin.  - Admit to stepdown  - will give 3.5L of NS bolus - start DKA protocol with BMP q4h - IVF: NS 125 cc/h; will switch to D5-1/2NS when CBG<250 - replete K as needed - Phenergan prn nausea  - NPO  - consult to diabetic educator and case manager  Leg wound, right: pt was on Keflex since 04/10/17. No fever. The wound is dry, no active draining. -hold  Abx - blood culture x 2 -wound care consulte  Nausea, vomiting, abdominal pain. likely due to DKA. No acute abdomen on physical examination. Pressure check lipase -Supportive care  Uncontrolled type 1 diabetes mellitus with hyperglycemia ([place]): Last A1c 12.9 on 03/08/17, poorly controled. Patient is taking novolog and 70/30 insulin at home, but not fully compliant. Now has DKA -on insulin gtt  GERD: -Protonix  HTN: -Hold her Lasix -Continue lisinopril -IV hydralazine when necessary  Tobacco abuse: -Did counseling about importance of quitting smoking -  Nicotine patch  Bilateral leg edema: etiology is not clear. Patient had negative venous Doppler for DVT on 04/12/17. May be due to lymphedema.  Urine protein 100. -check BNP to r/o CHF -hold lasix due to DAK and dehydration. -elevation of legs  Hx of stroke: patient is not taking aspirin. No history of aspirin allergy or peptic ulcer disease -start ASA  Hx of CAD: s/p of stent: no CP. -start ASA -prn NTG    DVT ppx:  SQ Lovenox Code Status: Full code Family Communication: None at bed side.     Disposition Plan:  Anticipate discharge back to previous home environment Consults called:  none Admission status:  SDU/inpation       Date of Service 04/16/2017    Ivor Costa [facility] Pager [phone]  If 7PM-7AM, please contact night-coverage [email or web address] Password [email or web address] 04/16/2017, 4:09 AM
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
## 2017-04-16 NOTE — Progress Notes (Addendum)
Inpatient Diabetes Program Recommendations  AACE/ADA: New Consensus Statement on Inpatient Glycemic Control (2015)  Target Ranges:  Prepandial:   less than 140 mg/dL      Peak postprandial:   less than 180 mg/dL (1-2 hours)      Critically ill patients:  140 - 180 mg/dL   Lab Results  Component Value Date   GLUCAP 133 (H) 04/16/2017   HGBA1C 12.9 (H) 03/08/2017   Review of Glycemic Control  Diabetes history: DM 1 Outpatient Diabetes medications: 70/30 38 units BID (Basal equivalent 53.2 units, short acting equivalent 22.8 units), Novolog 5 units tid Current orders for Inpatient glycemic control: IV insulin gtt  Inpatient Diabetes Program Recommendations:    Please consider BMET.  Depending on results (Anion gap closed AND acidosis cleared), if patient is appropriate please consider:  Lantus 30 units Q24 hours  Novolog Sensitive Correction 0-9 units tid + Novolog HS scale.  If eating Please consider Novolog 3 units tid meal coverage if patient consumes at least 50% of meals.  A1c 12.9% on 03/08/17. DM Coordinator spoke with patient at length on 10/8,10/9 in regards to patient compliance and resources. Patient received match letter at that time.  Patient called [facility] and informed ran out of insulin on 04/12/17. Prescription called to pharmacy. Office called pt but he did not pick up or call back. Message stated Voicemail was full.  Thanks,  Christena Deem RN, MSN, [facility] Inpatient Diabetes Coordinator Team Pager [phone] (8a-5p)

## 2017-04-16 NOTE — Progress Notes (Signed)
Attempted to get report.  No answer

## 2017-04-16 NOTE — ED Notes (Signed)
CBG 224; RN notified

## 2017-04-16 NOTE — ED Notes (Signed)
Carb modified tray ordered for patient. Dr. Cena Benton, reports can turn off insulin drip. Will place orders.

## 2017-04-16 NOTE — ED Notes (Signed)
CBG 133 

## 2017-04-17 DIAGNOSIS — E1010 Type 1 diabetes mellitus with ketoacidosis without coma: Principal | ICD-10-CM

## 2017-04-17 DIAGNOSIS — I10 Essential (primary) hypertension: Secondary | ICD-10-CM

## 2017-04-17 DIAGNOSIS — I639 Cerebral infarction, unspecified: Secondary | ICD-10-CM

## 2017-04-17 DIAGNOSIS — R600 Localized edema: Secondary | ICD-10-CM

## 2017-04-17 DIAGNOSIS — R1010 Upper abdominal pain, unspecified: Secondary | ICD-10-CM

## 2017-04-17 DIAGNOSIS — R112 Nausea with vomiting, unspecified: Secondary | ICD-10-CM

## 2017-04-17 LAB — BASIC METABOLIC PANEL
ANION GAP: 3 — AB (ref 5–15)
BUN: 13 mg/dL (ref 6–20)
CO2: 25 mmol/L (ref 22–32)
Calcium: 8.4 mg/dL — ABNORMAL LOW (ref 8.9–10.3)
Chloride: 106 mmol/L (ref 101–111)
Creatinine, Ser: 1 mg/dL (ref 0.61–1.24)
GFR calc Af Amer: >60 mL/min (ref >60)
GLUCOSE: 119 mg/dL — AB (ref 65–99)
POTASSIUM: 3.4 mmol/L — AB (ref 3.5–5.1)
Sodium: 134 mmol/L — ABNORMAL LOW (ref 135–145)

## 2017-04-17 LAB — GLUCOSE, CAPILLARY
GLUCOSE-CAPILLARY: 270 mg/dL — AB (ref 65–99)
GLUCOSE-CAPILLARY: 73 mg/dL (ref 65–99)
GLUCOSE-CAPILLARY: 89 mg/dL (ref 65–99)
Glucose-Capillary: 165 mg/dL — ABNORMAL HIGH (ref 65–99)
Glucose-Capillary: 56 mg/dL — ABNORMAL LOW (ref 65–99)

## 2017-04-17 MED ORDER — INSULIN ASPART PROT & ASPART (70-30 MIX) 100 UNIT/ML ~~LOC~~ SUSP: 25.0000 [IU] | Freq: Two times a day (BID) | SUBCUTANEOUS | Status: DC

## 2017-04-17 MED ORDER — GABAPENTIN 100 MG PO CAPS: 200.0000 mg | ORAL_CAPSULE | Freq: Three times a day (TID) | ORAL | 1 refills | Status: DC

## 2017-04-17 MED ORDER — INSULIN ASPART PROT & ASPART (70-30 MIX) 100 UNIT/ML ~~LOC~~ SUSP: 38.0000 [IU] | Freq: Two times a day (BID) | SUBCUTANEOUS | 3 refills | Status: DC

## 2017-04-17 MED ORDER — TRAMADOL HCL 50 MG PO TABS: 50.0000 mg | ORAL_TABLET | Freq: Four times a day (QID) | ORAL | 0 refills | Status: DC | PRN

## 2017-04-17 MED ORDER — INSULIN ASPART 100 UNIT/ML ~~LOC~~ SOLN: 5.0000 [IU] | Freq: Three times a day (TID) | SUBCUTANEOUS | 3 refills | Status: DC

## 2017-04-17 MED ORDER — "INSULIN SYRINGE 31G X 5/16"" 0.5 ML MISC": 0 refills | Status: DC

## 2017-04-17 MED ORDER — FUROSEMIDE 20 MG PO TABS: 20.0000 mg | ORAL_TABLET | Freq: Every day | ORAL | 0 refills | Status: DC

## 2017-04-17 MED ORDER — ASPIRIN EC 81 MG PO TBEC: 81.0000 mg | DELAYED_RELEASE_TABLET | Freq: Every day | ORAL | 2 refills | Status: DC

## 2017-04-17 MED ORDER — BLOOD GLUCOSE METER KIT: PACK | 0 refills | Status: DC

## 2017-04-17 MED FILL — traMADol HCL 50 MG TABS: 50 | 3 days supply | Qty: 15 | Fill #0

## 2017-04-17 MED FILL — ?FUROSEMIDE 20 MG TABLET: 20 | 30 days supply | Qty: 30 | Fill #0

## 2017-04-17 MED FILL — !TRUE METRIX BLOOD GLUCOSE: 365 days supply | Qty: 1 | Fill #0

## 2017-04-17 MED FILL — TRUEPLUS SYR 0.5ML 31GX5/16: 31G X 5/16" | 25 days supply | Qty: 100 | Fill #0

## 2017-04-17 MED FILL — !NOVOLOG 100UNITS/ML VIAL: 100/ML | 28 days supply | Qty: 10 | Fill #0

## 2017-04-17 MED FILL — GABAPENTIN 100 MG CAPSULE: 100 | 30 days supply | Qty: 180 | Fill #0
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
## 2017-04-17 NOTE — Care Management Note (Signed)
Case Management Note  Patient Details  Name: Matthew Riley MRN: [id number] Date of Birth: 10/01/1975  Subjective/Objective:                 Admitted with DKA.    Matthew Riley (Sister)     [phone]       PCP: [facility]  Action/Plan: Plan is to d/c to home today. Sister to provide transportation to home. Rxmeds faxed to [facility] pharmacy. Novolog and 70/30 insulin brought over from [facility]  Pt states sister will pick up medications from pharmacy on tomorrow. [facility]  Sister will provide transportation to home.  Expected Discharge Date:   04/17/2017           Expected Discharge Plan:  Home w Home Health Services  In-House Referral:     Discharge planning Services  CM Consult, [facility], Follow-up appt scheduled  Post Acute Care Choice:    Choice offered to:  Patient  DME Arranged:  Walker rolling DME Agency:  [facility]  HH Arranged:  PT [facility] Agency:  [facility]  Status of Service:  Completed, signed off  If discussed at Long Length of Stay Meetings, dates discussed:    Additional Comments:  Matthew Lesches, RN 04/17/2017, 4:13 PM

## 2017-04-17 NOTE — Discharge Summary (Signed)
 Physician Discharge Summary  Matthew Riley MRN:[id number] DOB: 04/17/1976 DOA: 04/15/2017  PCP: Hairston, Mandesia R, FNP  Admit date: 04/15/2017 Discharge date: 04/17/2017  Admitted From: home Disposition:  home  Recommendations for Outpatient Follow-up:  1. Follow up with PCP in 1-2 weeks as scheduled  Home Health: none Equipment/Devices: none  Discharge Condition: stable CODE STATUS: Full code Diet recommendation: diabetic  HPI: Per Dr. Niu, Matthew Riley is a 41 y.o. male with medical history significant of type 1 diabetes, hypertension, stroke, GERD, CAD, s/p of stent placement, tobacco abuse, who presents with generalized weakness, hyperglycemia, nausea vomiting and abdominal pain Pt states that he ran out of his insulin since last Friday and is unable to get it refill. His blood sugar is elevated, 580 on triage. Patient states that he he developed generalized weakness, nausea, vomiting and abdominal pain. He vomited twice today. Denies diarrhea. His abdominal pain is mild, located in the epigastric area, constant, nonradiating. Patient does not have chest pain, shortness breath, fever chills, cough, symptoms of UTI. He has  Chronic bilateral leg weakness from previous stroke. Patient has bilateral leg edema without clear diagnosis. He had lower extremity venous Doppler on 04/12/17 which was negative for DVT. Pt has been taking  Labs since 04/10/17, but not sure why he is taking this medications. He states that Keflex [facility] and probably for right lower leg wound.  ED Course: pt was found to have DKA with AG of 17,  Bicarbonate is 13, blood sugar 675,  WBC 10.9, negative urinalysis, creatinine normal, temperature normal, tachycardia, no tachypnea, oxygen saturation 99% on room air. Patient is admitted to stepdown as inpatient.   Hospital Course: Discharge Diagnoses:  Principal Problem:   DKA, type 1 (HCC) Active Problems:   Nausea & vomiting   GERD  (gastroesophageal reflux disease)   Uncontrolled type 1 diabetes mellitus with hyperglycemia (HCC)   Hypertension   Abdominal pain   Tobacco abuse   Leg wound, right   Bilateral leg edema   Stroke (cerebrum) (HCC)   CAD (coronary artery disease)   Hyperglycemia   DKA / IDDM- patient was admitted to the hospital in DKA as he ran out of his insulin at home.  He was admitted to stepdown, placed on DKA protocol, his anion gap closed and he was transitioned back to subcutaneous insulin.  His diet was advanced, he is able to tolerate diabetic diet without further abdominal pain, nausea or vomiting.  His CBGs are controlled.  He will be discharged home in stable condition, already has an follow-up appointment in about 2 weeks with a PCP.  Instructed patient not to skip insulin anymore, check CBGs 4 times daily, keep a log and show this to his PCP at the next appointment. Right leg wound -continue Keflex as per outpatient plan. Nausea vomiting and abdominal pain -due to DKA, resolved Hypertension -continue home medications Chronic lower extremity swelling -recent negative DVT Doppler.  BNP normal.  Resume Lasix Prior CVA -he is not on aspirin for unknown reasons, start aspirin on discharge   Discharge Instructions   Allergies as of 04/17/2017   No Known Allergies     Medication List    STOP taking these medications   ibuprofen 800 MG tablet Commonly known as:  ADVIL,MOTRIN   oxyCODONE-acetaminophen 5-325 MG tablet Commonly known as:  ROXICET     TAKE these medications   aspirin EC 81 MG tablet Take 1 tablet (81 mg total) daily by mouth.     bacitracin 500 UNIT/GM ointment Apply 1 application 2 (two) times daily topically. For 7 days.   blood glucose meter kit and supplies Dispense based on patient and insurance preference. Use up to four times daily as directed. (FOR ICD-9 250.00, 250.01).   cephALEXin 500 MG capsule Commonly known as:  KEFLEX Take 1 capsule (500 mg total) 4  (four) times daily by mouth.   cyclobenzaprine 10 MG tablet Commonly known as:  FLEXERIL Take 0.5 tablets (5 mg total) 3 (three) times daily as needed by mouth for muscle spasms.   furosemide 20 MG tablet Commonly known as:  LASIX Take 1 tablet (20 mg total) daily by mouth.   gabapentin 100 MG capsule Commonly known as:  NEURONTIN Take 2 capsules (200 mg total) 3 (three) times daily by mouth.   insulin aspart 100 UNIT/ML injection Commonly known as:  novoLOG Inject 5 Units 3 (three) times daily with meals into the skin.   insulin aspart protamine- aspart (70-30) 100 UNIT/ML injection Commonly known as:  NOVOLOG MIX 70/30 Inject 0.38 mLs (38 Units total) 2 (two) times daily with a meal into the skin.   INSULIN SYRINGE .5CC/31GX5/16" 31G X 5/16" 0.5 ML Misc Use as needed to inject insulin as instructed   lisinopril 20 MG tablet Commonly known as:  PRINIVIL,ZESTRIL Take 1 tablet (20 mg total) by mouth 2 (two) times daily.   pantoprazole 40 MG tablet Commonly known as:  PROTONIX Take 1 tablet (40 mg total) by mouth daily.   promethazine 25 MG tablet Commonly known as:  PHENERGAN Take 1 tablet (25 mg total) by mouth every 8 (eight) hours as needed for nausea, vomiting or refractory nausea / vomiting.   traMADol 50 MG tablet Commonly known as:  ULTRAM Take 1 tablet (50 mg total) every 6 (six) hours as needed by mouth for severe pain.      Consultations:  None   Procedures/Studies:  Dg Ribs Unilateral W/chest Left  Result Date: 03/22/2017 CLINICAL DATA:  Fell yesterday.  LEFT rib pain. EXAM: LEFT RIBS AND CHEST - 3+ VIEW COMPARISON:  Chest radiograph March 08, 2017 FINDINGS: No fracture or other bone lesions are seen involving the ribs. There is no evidence of pneumothorax or pleural effusion. Both lungs are clear. Heart size and mediastinal contours are within normal limits. IMPRESSION: Stable normal chest radiograph, no rib fracture deformity. Electronically Signed    By: Courtnay  Bloomer M.D.   On: 03/22/2017 18:43     Subjective: - no chest pain, shortness of breath, no abdominal pain, nausea or vomiting.    Discharge Exam: Vitals:   04/17/17 0509 04/17/17 0909  BP: (!) 128/92 (!) 152/106  Pulse: 85   Resp: 18   Temp: 98.4 F (36.9 C)   SpO2: 100%     General: Pt is alert, awake, not in acute distress Cardiovascular: RRR, S1/S2 +, no rubs, no gallops Respiratory: CTA bilaterally, no wheezing, no rhonchi Abdominal: Soft, NT, ND, bowel sounds + Extremities: no edema, no cyanosis    The results of significant diagnostics from this hospitalization (including imaging, microbiology, ancillary and laboratory) are listed below for reference.     Microbiology: Recent Results (from the past 240 hour(s))  Culture, blood (Routine X 2) w Reflex to ID Panel     Status: None (Preliminary result)   Collection Time: 04/16/17  2:39 AM  Result Value Ref Range Status   Specimen Description BLOOD RIGHT FOREARM  Final   Special Requests   Final      BOTTLES DRAWN AEROBIC AND ANAEROBIC Blood Culture adequate volume   Culture NO GROWTH 1 DAY  Final   Report Status PENDING  Incomplete  Culture, blood (Routine X 2) w Reflex to ID Panel     Status: None (Preliminary result)   Collection Time: 04/16/17  2:45 AM  Result Value Ref Range Status   Specimen Description BLOOD LEFT ANTECUBITAL  Final   Special Requests   Final    BOTTLES DRAWN AEROBIC AND ANAEROBIC Blood Culture adequate volume   Culture NO GROWTH 1 DAY  Final   Report Status PENDING  Incomplete     Labs: BNP (last 3 results) Recent Labs    03/08/17 1450 04/16/17 0239  BNP 55.1 71.0   Basic Metabolic Panel: Recent Labs  Lab 04/16/17 0239 04/16/17 1011 04/16/17 1826 04/16/17 2219 04/17/17 0208  NA 131* 134* 132* 134* 134*  K 4.4 3.4* 4.3 3.5 3.4*  CL 104 107 102 105 106  CO2 8* 21* _0 GLUCOSE 533* 126* 154* 106* 119*  BUN 24* _1 CREATININE 1.62* 1.11 1.02 1.00  1.00  CALCIUM 8.1* 8.4* 8.3* 8.4* 8.4*   Liver Function Tests: No results for input(s): AST, ALT, ALKPHOS, BILITOT, PROT, ALBUMIN in the last 168 hours. Recent Labs  Lab 04/16/17 0239  LIPASE 18   No results for input(s): AMMONIA in the last 168 hours. CBC: Recent Labs  Lab 04/15/17 2041 04/16/17 0059  WBC 10.9*  --   HGB 12.8* 14.3  HCT 39.0 42.0  MCV 93.8  --   PLT 222  --    Cardiac Enzymes: No results for input(s): CKTOTAL, CKMB, CKMBINDEX, TROPONINI in the last 168 hours. BNP: Invalid input(s): POCBNP CBG: Recent Labs  Lab 04/16/17 1818 04/16/17 1922 04/16/17 2228 04/17/17 0811 04/17/17 1229  GLUCAP 147* 139* 111* 270* 165*   D-Dimer No results for input(s): DDIMER in the last 72 hours. Hgb A1c No results for input(s): HGBA1C in the last 72 hours. Lipid Profile No results for input(s): CHOL, HDL, LDLCALC, TRIG, CHOLHDL, LDLDIRECT in the last 72 hours. Thyroid function studies No results for input(s): TSH, T4TOTAL, T3FREE, THYROIDAB in the last 72 hours.  Invalid input(s): FREET3 Anemia work up No results for input(s): VITAMINB12, FOLATE, FERRITIN, TIBC, IRON, RETICCTPCT in the last 72 hours. Urinalysis    Component Value Date/Time   COLORURINE YELLOW 04/15/2017 2052   APPEARANCEUR CLEAR 04/15/2017 2052   LABSPEC 1.027 04/15/2017 2052   PHURINE 6.0 04/15/2017 2052   GLUCOSEU >=500 (A) 04/15/2017 2052   HGBUR SMALL (A) 04/15/2017 2052   BILIRUBINUR NEGATIVE 04/15/2017 2052   KETONESUR 80 (A) 04/15/2017 2052   PROTEINUR 100 (A) 04/15/2017 2052   NITRITE NEGATIVE 04/15/2017 2052   LEUKOCYTESUR NEGATIVE 04/15/2017 2052   Sepsis Labs Invalid input(s): PROCALCITONIN,  WBC,  LACTICIDVEN   Time coordinating discharge: 35 minutes  SIGNED:  Marzetta Board, MD  [facility] 04/17/2017, 12:57 PM Pager [phone]  If 7PM-7AM, please contact night-coverage [email or web address] Password [email or web address]

## 2017-04-17 NOTE — Care Management Obs Status (Signed)
MEDICARE OBSERVATION STATUS NOTIFICATION   Patient Details  Name: Matthew Riley MRN: [id number] Date of Birth: 06-25-1975   Medicare Observation Status Notification Given:  Yes    Epifanio Lesches, RN 04/17/2017, 5:08 PM

## 2017-04-17 NOTE — Hospital Discharge Follow-Up (Signed)
Transitional Care Clinic Care Coordination Note:  Admit date:  04/15/2017 Discharge date: 04/17/2017 Discharge Disposition: home Patient contact:  Cell # [phone]. The patient stated that this phone is currently turned off. Payment needs to be made before it can be turned on again. He stated that this CM should contact his sister when needed Emergency contact(s):  Lorriane Shire ( sister) # [phone]  This Case Manager reviewed patient's EMR and determined patient would benefit from post-discharge medical management and chronic care management services through the [facility]. Patient has a history of DM - type 1, HTN, stroke, GERD, CAD.  He was admitted with generalized weakness, nausea, vomiting and abdominal pain. He has been treated for DKA, HTN and right leg wound. He has 2 hospital admissions and 1 ED visit since 03/08/2017.. This Case Manager met with patient to discuss the services and medical management that can be provided at the [facility]. Patient verbalized understanding and agreed to receive post-discharge care at the [facility].   Patient scheduled for Transitional Care appointment on 04/23/17 @ 1400.  Clinic information and appointment time provided to patient. Appointment information also placed on AVS.  Assessment:       Home Environment:  Lives with his sister in a private home with staircases to second floor bedrooms. He explained that he moved to [place] from [place] about 2 months ago after having a stroke.  He stated that his sister came to [place] to get him and bring him to [place] so she could care for him.       Support System: his sister.She works full time and doesn't get home from work until around 1800.        Level of functioning: needs assistance with ADLs        Home DME: He stated that he has a RW but it is broken. Whitman Hero, RN CM to contact [facility] to provide the patient with a new RW prior to discharge.   He said that  he has a glucometer and uses it to check his blood sugar 3 times a day.        Home care services: (services arranged prior to discharge or new services after discharge) - He had [facility] active prior to hospitalization. The RN services were discontinued.  Marcellus Scott, RN CM to check with [facility] regarding resuming services post discharge. He would need to receive charity care as he has no insurance.        Transportation: he stated that his sister can drive him to his appointments.         Food/Nutrition: (ability to afford, access, use of any community resources) - he stated that he could use food resources. Provided him with booklets listing free meals and food pantries in [place].  Also informed him of the free food distribution at [facility] the fourth Tuesday of the month.  He does not receive food stamps         Medications: (ability to afford, access, compliance, Pharmacy used) - Uses [facility]. Stated that his sister gets his medications for him. This CM delivered his novolog and novolog 70/30 to Whitman Hero , RN CM for the patient's floor nurse to give to him at discharge. The medications are from [place] and the pharmacy would be closed when the patient's sister comes to pick him up this evening,         Identified Barriers: no insurance, no income, ? Compliance with medication regime and  therapy/safety recommendations, lack of support in the community other than his sister, new to the area, need for food        PCP (Name, office location, phone number): Fredia Beets, FNP  Patient Education: explained services provided at [place] assistance, SW and financial counseling. The patient stated that he will need to schedule an appointment with Financial Counseling at [facility] to apply for the [facility].  He also noted that he needs to apply for disability and medicaid as well as food stamps.             Arranged services:        Services communicated to Whitman Hero, RN CM

## 2017-04-17 NOTE — Progress Notes (Signed)
Inpatient Diabetes Program Recommendations  AACE/ADA: New Consensus Statement on Inpatient Glycemic Control (2015)  Target Ranges:  Prepandial:   less than 140 mg/dL      Peak postprandial:   less than 180 mg/dL (1-2 hours)      Critically ill patients:  140 - 180 mg/dL   Lab Results  Component Value Date   GLUCAP 165 (H) 04/17/2017   HGBA1C 12.9 (H) 03/08/2017    Review of Glycemic Control  Diabetes history: DM1/ 2 Outpatient Diabetes medications: 70/30 38 units bid  Current orders for Inpatient glycemic control: 70/30 38 units bid, Novolog 0-15 units tidwc and hs  Pt goes to [facility] for diabetes management. Spoke to pt at length regarding his HgbA1C of 12.9%. Pt states he had called the [facility] office to tell them he had run out of insulin and they said they didn't have any. Pt went without insulin and then presented to ED in DKA.  Instructed pt that he can purchase 70/30 insulin at [facility] without prescription if he gets in a bind. States he has meter and checks blood sugars regularly. Stressed importance of f/u with MD and preparing to obtain insulin before it runs out.  Inpatient Diabetes Program Recommendations:   Agree with orders.  Will follow.  Thank you. Ailene Ards, RD, LDN, CDE Inpatient Diabetes Coordinator [phone]

## 2017-04-17 NOTE — Progress Notes (Signed)
Patient arrived to the floor via wheelchair from 5 central.  Patient is alert and oriented  X4.  Complaints of lower back pain. Vital signs: Temp: 98.7; BP: 135/96; Pulse: 87; Resp: 18 and 100% on room air.  Skin assessment complete.  IV intact to right forearm and Left wrist.  Non pressure wound to the right posterior arm and right lower leg.  Wound care orders in place.  Educated the patient on how to reach the staff on the unit.  Lowered the bed, activated the bed alarm and placed the call light within reach.  Will continue to monitor the patient.

## 2017-04-18 ENCOUNTER — Telehealth: Payer: Self-pay

## 2017-04-18 NOTE — Telephone Encounter (Signed)
Transitional Care Clinic Post-discharge Follow-Up Phone Call:  Date of Discharge:  04/17/2017 Principal Discharge Diagnosis(es): DKA- type 1 DM, HTN, stroke, CAD Post-discharge Communication: (Clearly document all attempts clearly and date contact made) call placed to the patient's sister, Erie Noe because his phone has not yet been turned on.  Call Completed: Yes                    With Whom: sister - Erie Noe - she confirmed that the patient's phone still has not been turned on.  Interpreter Needed: No     Please check all that apply:  ? Patient is knowledgeable of his/her condition(s) and/or treatment. ? Patient is caring for self at home.  X  Patient is receiving assist at home from family and/or caregiver. Family and/or caregiver is knowledgeable of patient's condition(s) and/or treatment. Erie Noe stated that she was at work at the time of the call.  She stated that she will be able to bring him to his appointment at [facility] on 04/23/17. She noted that she will ask any questions that she has when she comes to the clinic.   X  Patient is receiving home health services. If so, name of agency. - [facility]. Call placed to Gae Gallop, RN CM today and she confirmed that a referral was made to [facility] for SN, PT and SW     Medication Reconciliation:  ? Medication list reviewed with patient. X  Patient obtained all discharge medications. If not, why? - as per Erie Noe, sister, he has all of his medications and is responsible for taking them himself. She does not pre-fill any medication box. She also noted that he is independent with blood glucose monitoring.  She said that any questions about medications can be discussed with her when he is at his appointment next week. She was at work at the time of CM call     Activities of Daily Living:  ? Independent X  Needs assist (describe; ? home DME used) - has walker. Needs some assistance with ADLs.  ? [facility] (describe, ? home DME used)   Community  resources in place for patient:  ? None  X  Home Health/Home DME - [facility] ? Assisted Living ? Support Group               Questions/Concerns discussed:appointment for 04/23/17 @ 1400 confirmed with Erie Noe.

## 2017-04-19 ENCOUNTER — Telehealth: Payer: Self-pay

## 2017-04-19 NOTE — Telephone Encounter (Signed)
Call placed to [facility] to inquire when the nurse will be seeing the patient. Spoke to Casimiro Needle who stated that he is scheduled to be seen on Monday, 04/22/17. Requested that the nurse thoroughly reconcile all medications with the patient as he is managing them independently because his sister is working during the day. The nurse can call this office with any questions. Casimiro Needle also confirmed that they contacted the patient's sister to schedule the appointment.

## 2017-04-21 ENCOUNTER — Other Ambulatory Visit: Payer: Self-pay | Admitting: Family Medicine

## 2017-04-21 LAB — CULTURE, BLOOD (ROUTINE X 2)
Culture: NO GROWTH
Culture: NO GROWTH
Special Requests: ADEQUATE
Special Requests: ADEQUATE

## 2017-04-22 ENCOUNTER — Other Ambulatory Visit: Payer: Self-pay | Admitting: Family Medicine

## 2017-04-22 ENCOUNTER — Telehealth: Payer: Self-pay | Admitting: Family Medicine

## 2017-04-22 DIAGNOSIS — R768 Other specified abnormal immunological findings in serum: Secondary | ICD-10-CM

## 2017-04-22 NOTE — Telephone Encounter (Signed)
Call placed to [facility] #[phone] regarding patient's home health and nursing referral. Spoke with Fayrene Fearing and he informed me that referral was received. Patient received physical therapy on 11/16, and medical social worker also saw patient that same day (11/16). Lawanda Cousins, nurse, will visit patient today Monday 11/19.

## 2017-04-23 ENCOUNTER — Ambulatory Visit: Payer: Medicaid Other | Attending: Family Medicine | Admitting: Family Medicine

## 2017-04-23 ENCOUNTER — Encounter: Payer: Self-pay | Admitting: Family Medicine

## 2017-04-23 ENCOUNTER — Telehealth: Payer: Self-pay | Admitting: Family Medicine

## 2017-04-23 VITALS — BP 162/114 | HR 104 | Temp 98.4°F | Ht 73.0 in | Wt 210.8 lb

## 2017-04-23 DIAGNOSIS — R600 Localized edema: Secondary | ICD-10-CM | POA: Insufficient documentation

## 2017-04-23 DIAGNOSIS — E1065 Type 1 diabetes mellitus with hyperglycemia: Secondary | ICD-10-CM | POA: Diagnosis not present

## 2017-04-23 DIAGNOSIS — Z79899 Other long term (current) drug therapy: Secondary | ICD-10-CM | POA: Insufficient documentation

## 2017-04-23 DIAGNOSIS — Z794 Long term (current) use of insulin: Secondary | ICD-10-CM | POA: Diagnosis not present

## 2017-04-23 DIAGNOSIS — Z955 Presence of coronary angioplasty implant and graft: Secondary | ICD-10-CM | POA: Insufficient documentation

## 2017-04-23 DIAGNOSIS — I69359 Hemiplegia and hemiparesis following cerebral infarction affecting unspecified side: Secondary | ICD-10-CM | POA: Insufficient documentation

## 2017-04-23 DIAGNOSIS — R32 Unspecified urinary incontinence: Secondary | ICD-10-CM | POA: Diagnosis not present

## 2017-04-23 DIAGNOSIS — N289 Disorder of kidney and ureter, unspecified: Secondary | ICD-10-CM | POA: Insufficient documentation

## 2017-04-23 DIAGNOSIS — K219 Gastro-esophageal reflux disease without esophagitis: Secondary | ICD-10-CM | POA: Diagnosis not present

## 2017-04-23 DIAGNOSIS — R739 Hyperglycemia, unspecified: Secondary | ICD-10-CM | POA: Diagnosis present

## 2017-04-23 DIAGNOSIS — I639 Cerebral infarction, unspecified: Secondary | ICD-10-CM

## 2017-04-23 DIAGNOSIS — L97819 Non-pressure chronic ulcer of other part of right lower leg with unspecified severity: Secondary | ICD-10-CM | POA: Insufficient documentation

## 2017-04-23 DIAGNOSIS — I2510 Atherosclerotic heart disease of native coronary artery without angina pectoris: Secondary | ICD-10-CM | POA: Insufficient documentation

## 2017-04-23 DIAGNOSIS — L98499 Non-pressure chronic ulcer of skin of other sites with unspecified severity: Secondary | ICD-10-CM | POA: Insufficient documentation

## 2017-04-23 DIAGNOSIS — S81801A Unspecified open wound, right lower leg, initial encounter: Secondary | ICD-10-CM

## 2017-04-23 DIAGNOSIS — I10 Essential (primary) hypertension: Secondary | ICD-10-CM | POA: Diagnosis not present

## 2017-04-23 DIAGNOSIS — E10622 Type 1 diabetes mellitus with other skin ulcer: Secondary | ICD-10-CM | POA: Diagnosis not present

## 2017-04-23 DIAGNOSIS — Z7982 Long term (current) use of aspirin: Secondary | ICD-10-CM | POA: Insufficient documentation

## 2017-04-23 DIAGNOSIS — R296 Repeated falls: Secondary | ICD-10-CM | POA: Insufficient documentation

## 2017-04-23 LAB — POCT URINALYSIS DIPSTICK
Bilirubin, UA: NEGATIVE
Glucose, UA: 500
Ketones, UA: NEGATIVE
LEUKOCYTES UA: NEGATIVE
NITRITE UA: NEGATIVE
PH UA: 6.5 (ref 5.0–8.0)
Spec Grav, UA: 1.015 (ref 1.010–1.025)
Urobilinogen, UA: 2 E.U./dL — AB

## 2017-04-23 LAB — GLUCOSE, POCT (MANUAL RESULT ENTRY)
POC GLUCOSE: 413 mg/dL — AB (ref 70–99)
POC Glucose: 394 mg/dl — AB (ref 70–99)

## 2017-04-23 MED ORDER — CLONIDINE HCL 0.1 MG PO TABS
0.1000 mg | ORAL_TABLET | Freq: Once | ORAL | Status: AC
  Administered 2017-04-23: 0.1 mg via ORAL

## 2017-04-23 MED ORDER — INSULIN ASPART 100 UNIT/ML ~~LOC~~ SOLN
10.0000 [IU] | Freq: Once | SUBCUTANEOUS | Status: AC
  Administered 2017-04-23: 10 [IU] via SUBCUTANEOUS

## 2017-04-23 NOTE — Patient Instructions (Signed)
Edema Edema is when you have too much fluid in your body or under your skin. Edema may make your legs, feet, and ankles swell up. Swelling is also common in looser tissues, like around your eyes. This is a common condition. It gets more common as you get older. There are many possible causes of edema. Eating too much salt (sodium) and being on your feet or sitting for a long time can cause edema in your legs, feet, and ankles. Hot weather may make edema worse. Edema is usually painless. Your skin may look swollen or shiny. Follow these instructions at home:  Keep the swollen body part raised (elevated) above the level of your heart when you are sitting or lying down.  Do not sit still or stand for a long time.  Do not wear tight clothes. Do not wear garters on your upper legs.  Exercise your legs. This can help the swelling go down.  Wear elastic bandages or support stockings as told by your doctor.  Eat a low-salt (low-sodium) diet to reduce fluid as told by your doctor.  Depending on the cause of your swelling, you may need to limit how much fluid you drink (fluid restriction).  Take over-the-counter and prescription medicines only as told by your doctor. Contact a doctor if:  Treatment is not working.  You have heart, liver, or kidney disease and have symptoms of edema.  You have sudden and unexplained weight gain. Get help right away if:  You have shortness of breath or chest pain.  You cannot breathe when you lie down.  You have pain, redness, or warmth in the swollen areas.  You have heart, liver, or kidney disease and get edema all of a sudden.  You have a fever and your symptoms get worse all of a sudden. Summary  Edema is when you have too much fluid in your body or under your skin.  Edema may make your legs, feet, and ankles swell up. Swelling is also common in looser tissues, like around your eyes.  Raise (elevate) the swollen body part above the level of your  heart when you are sitting or lying down.  Follow your doctor's instructions about diet and how much fluid you can drink (fluid restriction). This information is not intended to replace advice given to you by your health care provider. Make sure you discuss any questions you have with your health care provider. Document Released: 11/07/2007 Document Revised: 06/08/2016 Document Reviewed: 06/08/2016 Elsevier Interactive Patient Education  2017 [employer]  

## 2017-04-23 NOTE — Telephone Encounter (Signed)
Call placed to patient regarding his appointment today. Spoke with his sister Franne Grip and she confirmed that she will be bringing patient to the office.

## 2017-04-24 ENCOUNTER — Encounter: Payer: Self-pay | Admitting: Family Medicine

## 2017-04-24 ENCOUNTER — Telehealth: Payer: Self-pay

## 2017-04-24 NOTE — Progress Notes (Signed)
Subjective:  Patient ID: Matthew Riley, male    DOB: 10-05-1975  Age: 41 y.o. MRN: [id number]  CC: Hyperglycemia   HPI Matthew Riley is a 41 year old male with a history of type 2 diabetes mellitus (A1c of 12.9), diabetic ulcers, hypertension, CVA with lower extremity weakness, CAD (status post stent), GERD who presents today for follow-up from hospitalization from 04/15/17 through 04/17/17 where he was managed for DKA.  He had presented after running out of his medications, was found to have an elevated blood sugar of 580 with an anion gap of 17.  He had associated nausea, vomiting. He was admitted to stepdown unit and commenced on IV insulin, IV fluids. Lower extremity Doppler performed due to bilateral lower extremity edema and was negative for DVT. He was also placed on aspirin due to previous history of CVA. With improvement in his blood sugar he was subsequently discharged.  He presents today with blood sugar of 413 and he admits not taking his insulin as he was in a hurry due to waking up late.  30 units of NovoLog has been administered. His blood pressure is also elevated and he did not take any antihypertensives today; clonidine 0.1 mg administered. He denies nausea, vomiting or abdominal pain.  He ambulates with the aid of a walker due to his lower extremity weakness from his CVA.  He resides with his sister who has stairs and rocks in her home and this has caused him to fall on some occasions which is usually the etiology for his ulcer.  He denies any drainage, pain at the site of his right forearm and right leg ulcer and reports completion of Keflex which was prescribed by his PCP. He does have urinary incontinence and wears depends.  Past Medical History:  Diagnosis Date  . Diabetes mellitus without complication ([place])   . Hypertension   . Renal disorder   . Stroke [facility])     Past Surgical History:  Procedure Laterality Date  . CORONARY ANGIOPLASTY WITH STENT PLACEMENT       No Known Allergies   Outpatient Medications Prior to Visit  Medication Sig Dispense Refill  . aspirin EC 81 MG tablet Take 1 tablet (81 mg total) daily by mouth. 30 tablet 2  . bacitracin 500 UNIT/GM ointment Apply 1 application 2 (two) times daily topically. For 7 days. 15 g 1  . blood glucose meter kit and supplies Dispense based on patient and insurance preference. Use up to four times daily as directed. (FOR ICD-9 250.00, 250.01). 1 each 0  . cyclobenzaprine (FLEXERIL) 10 MG tablet Take 0.5 tablets (5 mg total) 3 (three) times daily as needed by mouth for muscle spasms. 30 tablet 0  . furosemide (LASIX) 20 MG tablet Take 1 tablet (20 mg total) daily by mouth. 30 tablet 0  . gabapentin (NEURONTIN) 100 MG capsule Take 2 capsules (200 mg total) 3 (three) times daily by mouth. 180 capsule 1  . insulin aspart (NOVOLOG) 100 UNIT/ML injection Inject 5 Units 3 (three) times daily with meals into the skin. 10 mL 3  . insulin aspart protamine- aspart (NOVOLOG MIX 70/30) (70-30) 100 UNIT/ML injection Inject 0.38 mLs (38 Units total) 2 (two) times daily with a meal into the skin. 10 mL 3  . Insulin Syringe-Needle U-100 (INSULIN SYRINGE .5CC/31GX5/16") 31G X 5/16" 0.5 ML MISC Use as needed to inject insulin as instructed 300 each 0  . lisinopril (PRINIVIL,ZESTRIL) 20 MG tablet Take 1 tablet (20 mg total) by mouth 2 (  two) times daily. 60 tablet 3  . pantoprazole (PROTONIX) 40 MG tablet Take 1 tablet (40 mg total) by mouth daily. 30 tablet 1  . promethazine (PHENERGAN) 25 MG tablet Take 1 tablet (25 mg total) by mouth every 8 (eight) hours as needed for nausea, vomiting or refractory nausea / vomiting. 20 tablet 0  . traMADol (ULTRAM) 50 MG tablet Take 1 tablet (50 mg total) every 6 (six) hours as needed by mouth for severe pain. 15 tablet 0  . cephALEXin (KEFLEX) 500 MG capsule Take 1 capsule (500 mg total) 4 (four) times daily by mouth. (Patient not taking: Reported on 04/23/2017) 20 capsule 0   No  facility-administered medications prior to visit.     ROS Review of Systems  Constitutional: Negative for activity change and appetite change.  HENT: Negative for sinus pressure and sore throat.   Eyes: Negative for visual disturbance.  Respiratory: Negative for cough, chest tightness and shortness of breath.   Cardiovascular: Positive for leg swelling. Negative for chest pain.  Gastrointestinal: Negative for abdominal distention, abdominal pain, constipation and diarrhea.  Endocrine: Negative.   Genitourinary: Negative for dysuria.  Musculoskeletal: Negative for joint swelling and myalgias.  Skin: Positive for wound. Negative for rash.  Allergic/Immunologic: Negative.   Neurological: Negative for weakness, light-headedness and numbness.  Psychiatric/Behavioral: Negative for dysphoric mood and suicidal ideas.    Objective:  BP (!) 162/114   Pulse (!) 104   Temp 98.4 F (36.9 C) (Oral)   Ht '6\' 1"'  (1.854 m)   Wt 210 lb 12.8 oz (95.6 kg)   SpO2 99%   BMI 27.81 kg/m   BP/Weight 04/23/2017 04/17/2017 44/62/8638  Systolic BP 177 116 -  Diastolic BP 579 81 -  Wt. (Lbs) 210.8 - 209  BMI 27.81 - 27.57      Physical Exam  Constitutional: He is oriented to person, place, and time. He appears well-developed and well-nourished.  Cardiovascular: Normal rate, normal heart sounds and intact distal pulses.  No murmur heard. Pulmonary/Chest: Effort normal and breath sounds normal. He has no wheezes. He has no rales. He exhibits no tenderness.  Abdominal: Soft. Bowel sounds are normal. He exhibits no distension and no mass. There is no tenderness.  Musculoskeletal: Normal range of motion. He exhibits edema (3+ in RLE, 2+ in LLE).  Neurological: He is alert and oriented to person, place, and time.  Skin:  Ulcer on posterior aspect of right lower leg with superficial scab, no drainage, no surrounding cellulitis Ulcer on extensor aspect of right forearm with superficial scab, no drainage,  no surrounding cellulitis  Psychiatric: He has a normal mood and affect.    Lab Results  Component Value Date   HGBA1C 12.9 (H) 03/08/2017     Assessment & Plan:   1. Uncontrolled type 1 diabetes mellitus with hyperglycemia (HCC) Controlled with A1c of 12.9 CBG of 413; 30 units of NovoLog administered in CBG repeated after 30 minutes Compliance with medications emphasized - POCT glucose (manual entry) - insulin aspart (novoLOG) injection 10 Units - POCT urinalysis dipstick - POCT glucose (manual entry)  2. Essential hypertension Uncontrolled due to the fact the patient is yet to take his medications Compliance with medications have been emphasized Low sodium, DASH diet Clonidine 0.1 mg administered and blood pressure rechecked after 30 minutes - cloNIDine (CATAPRES) tablet 0.1 mg  3. Wound of right lower extremity, initial encounter Completed a course of Keflex No acute infection He is at a high risk of subsequent ulcers  given history of stroke and frequent falls  4. Bilateral leg edema DVT negative for Doppler Suspicious for venous insufficiency Use compression stockings Elevate feet as much as possible  5. Cerebrovascular accident (CVA), unspecified mechanism ([place]) With bilateral lower extremity weakness High risk for falls Discussed the need to modify living conditions to reduce fall risk Ambulate with the aid of a walker Continue aspirin  6. Coronary artery disease involving native coronary artery of native heart without angina pectoris Status post stent placement Risk factor modification -especially optimal glycemic and blood pressure control, tobacco cessation Continue statin, aspirin   Meds ordered this encounter  Medications  . cloNIDine (CATAPRES) tablet 0.1 mg  . insulin aspart (novoLOG) injection 10 Units    Follow-up: Return for Follow-up of chronic medical conditions, keep scheduled appointment with PCP.   Arnoldo Morale MD

## 2017-04-24 NOTE — Telephone Encounter (Signed)
Call placed to [facility] in attempt to speak to the patient's nurse to inquire about how he is managing his medications at home.  Spoke to Casimiro Needle in the office who transferred the call to his nurse, Marylene Land.   Spoke to [place] who stated that she has not seen him yet. She will need to check her schedule as she is new to the area and stated she will call this CM back after seeing him. Call back [phone].

## 2017-04-29 ENCOUNTER — Other Ambulatory Visit: Payer: Self-pay | Admitting: Family Medicine

## 2017-04-29 ENCOUNTER — Telehealth: Payer: Self-pay

## 2017-04-29 DIAGNOSIS — R768 Other specified abnormal immunological findings in serum: Secondary | ICD-10-CM

## 2017-04-29 DIAGNOSIS — M0579 Rheumatoid arthritis with rheumatoid factor of multiple sites without organ or systems involvement: Secondary | ICD-10-CM

## 2017-04-29 MED ORDER — INDOMETHACIN 50 MG PO CAPS: 50.0000 mg | ORAL_CAPSULE | Freq: Two times a day (BID) | ORAL | 1 refills | Status: DC | PRN

## 2017-04-29 MED ORDER — METHOTREXATE 2.5 MG PO TABS: 7.5000 mg | ORAL_TABLET | ORAL | 0 refills | Status: DC

## 2017-04-29 NOTE — Telephone Encounter (Signed)
Amber from [facility] called to let pt. PCP know that she went to visit the pt. And his BP was 160/110. Pt. Stated that he did not want to return to the hospital and that he would take his medication.  °

## 2017-04-29 NOTE — Telephone Encounter (Signed)
Message received from Lebron Conners, DSS requesting a call back # [phone].   Call returned with Jenel Lucks, LCSW/[facility] present also.  Lanae stated that she has seen the patient a couple of times and plans to see him again.  She stated  that the patient stated he was admitted to the hospital on 04/15/17 because he said was not able to get his insulin refilled. Informed her that the patient had called for a refill on 04/12/17 and the medication was refilled that day. The pharmacist had tried to contact the patient to let him know that the medication was refilled but he did not answer the call and the voicemail was full.  Lanae stated that he now has all of his medications. She also noted that his sister is not a caregiver, she only provides transportation.  She explained that she gave the patient a mail in [place] application and let his sister know that it needs to be turned in to DSS.  She also noted that she provided information for starting a disability application over the phone.   She stated that a determination has not yet been made on the case.

## 2017-04-29 NOTE — Telephone Encounter (Signed)
Amber from [facility] called to let pt. PCP know that she went to visit the pt. And his BP was 160/110. Pt. Stated that he did not want to return to the hospital and that he would take his medication.

## 2017-04-30 NOTE — Telephone Encounter (Signed)
CMA call regarding lab results   Patient did not answer but left a VM stating the reason of the call &  to call me back  

## 2017-04-30 NOTE — Telephone Encounter (Signed)
-----   Message from Lizbeth Bark, FNP sent at 04/29/2017 12:52 PM EST ----- -RF test is positive. You have rheumatoid arthritis. You will prescribed methotrexate for RA and indocin for pain. You will be referred to rheumatology.  -ANA test which evaluates diseases or conditions which cause inflammation is positive.

## 2017-04-30 NOTE — Telephone Encounter (Signed)
-----   Message from Lizbeth Bark, FNP sent at 04/29/2017 12:52 PM EST ----- Negative for blood clots.

## 2017-05-01 ENCOUNTER — Ambulatory Visit: Payer: Self-pay | Admitting: Family Medicine

## 2017-05-06 ENCOUNTER — Ambulatory Visit: Payer: Self-pay | Admitting: Family Medicine

## 2017-05-09 ENCOUNTER — Telehealth: Payer: Self-pay | Admitting: Family Medicine

## 2017-05-09 NOTE — Telephone Encounter (Signed)
Call placed to patient on both numbers on file, to check on his status and to follow up on matter such as home health and medicaid/disability. No answer. Left messages on both numbers [phone] and [phone]) asking patient to return my call at [phone].

## 2017-05-10 ENCOUNTER — Telehealth: Payer: Self-pay | Admitting: Family Medicine

## 2017-05-10 ENCOUNTER — Other Ambulatory Visit: Payer: Self-pay | Admitting: Family Medicine

## 2017-05-10 DIAGNOSIS — E109 Type 1 diabetes mellitus without complications: Secondary | ICD-10-CM

## 2017-05-10 DIAGNOSIS — M7918 Myalgia, other site: Secondary | ICD-10-CM

## 2017-05-10 MED ORDER — INSULIN ASPART PROT & ASPART (70-30 MIX) 100 UNIT/ML ~~LOC~~ SUSP: 38.0000 [IU] | Freq: Two times a day (BID) | SUBCUTANEOUS | 0 refills | Status: DC

## 2017-05-10 NOTE — Telephone Encounter (Signed)
Patient called the office to request medication refill for all his medication. He is out of most. He has an upcoming appointment on 12/13. Please send rx  to [facility] pharmacy.    Thank you.

## 2017-05-10 NOTE — Telephone Encounter (Signed)
Patient returned my call and stated that he was doing good. He missed his last appointment because his sister was working and he had no transportation. We rescheduled his appointment for 12/13.  During our conversation patient also stated that he had run out or will soon run out of his medication. Sent to a request for refill to [facility] pharmacist). Patient also stated that he has an upcoming appointment at the Social Security office for his disability on 12/20. Patient has also filled out his application for [employer] and will bring it with him on his next appointment.

## 2017-05-10 NOTE — Telephone Encounter (Signed)
Checked pharmacy records and patient still has refills on many of his medications. It does look like he is out of tramadol and furosemide. Will forward request for these medications to PCP

## 2017-05-15 ENCOUNTER — Other Ambulatory Visit: Payer: Self-pay | Admitting: Family Medicine

## 2017-05-15 MED ORDER — TRAMADOL HCL 50 MG PO TABS: 50.0000 mg | ORAL_TABLET | Freq: Three times a day (TID) | ORAL | 0 refills | Status: DC | PRN

## 2017-05-15 MED ORDER — INSULIN ASPART PROT & ASPART (70-30 MIX) 100 UNIT/ML ~~LOC~~ SUSP: 38.0000 [IU] | Freq: Two times a day (BID) | SUBCUTANEOUS | 8 refills | Status: DC

## 2017-05-15 NOTE — Telephone Encounter (Signed)
CMA call regarding Refill tramadol RX is ready at front desk for pick up   Patient did not answer but left a VM

## 2017-05-15 NOTE — Telephone Encounter (Signed)
Patient was called on 05/10/17 regarding medication request. Medication sent to [facility] pharmacy on file to avoid patient being without access to his medication. He communicated understanding and was agreeable to plan. Will resend prescription to [facility] pharmacy and refill Tramadol today. No further refills on furosemide without an office visit due because medication monitoring is needed.

## 2017-05-15 NOTE — Telephone Encounter (Signed)
Patient was called on 05/10/17 regarding medication request. Medication sent to [facility] pharmacy on file to avoid patient being without access to his medication. He communicated understanding and was agreeable to plan. Will resend prescription to [facility] pharmacy and refill Tramadol today.

## 2017-05-16 ENCOUNTER — Telehealth: Payer: Self-pay

## 2017-05-16 ENCOUNTER — Ambulatory Visit: Payer: Medicaid Other | Attending: Family Medicine | Admitting: Family Medicine

## 2017-05-16 ENCOUNTER — Other Ambulatory Visit: Payer: Self-pay | Admitting: Family Medicine

## 2017-05-16 ENCOUNTER — Telehealth: Payer: Self-pay | Admitting: Family Medicine

## 2017-05-16 ENCOUNTER — Encounter: Payer: Self-pay | Admitting: Family Medicine

## 2017-05-16 VITALS — BP 135/92 | HR 98 | Temp 97.5°F | Resp 18 | Ht 73.0 in | Wt 206.6 lb

## 2017-05-16 DIAGNOSIS — T148XXA Other injury of unspecified body region, initial encounter: Secondary | ICD-10-CM | POA: Insufficient documentation

## 2017-05-16 DIAGNOSIS — L97929 Non-pressure chronic ulcer of unspecified part of left lower leg with unspecified severity: Secondary | ICD-10-CM | POA: Diagnosis not present

## 2017-05-16 DIAGNOSIS — M62838 Other muscle spasm: Secondary | ICD-10-CM

## 2017-05-16 DIAGNOSIS — L97921 Non-pressure chronic ulcer of unspecified part of left lower leg limited to breakdown of skin: Secondary | ICD-10-CM

## 2017-05-16 DIAGNOSIS — G8929 Other chronic pain: Secondary | ICD-10-CM | POA: Diagnosis not present

## 2017-05-16 DIAGNOSIS — M545 Low back pain: Secondary | ICD-10-CM | POA: Diagnosis not present

## 2017-05-16 DIAGNOSIS — T07XXXA Unspecified multiple injuries, initial encounter: Secondary | ICD-10-CM

## 2017-05-16 DIAGNOSIS — R600 Localized edema: Secondary | ICD-10-CM | POA: Diagnosis not present

## 2017-05-16 DIAGNOSIS — I10 Essential (primary) hypertension: Secondary | ICD-10-CM | POA: Diagnosis not present

## 2017-05-16 DIAGNOSIS — E1065 Type 1 diabetes mellitus with hyperglycemia: Secondary | ICD-10-CM | POA: Diagnosis present

## 2017-05-16 DIAGNOSIS — R110 Nausea: Secondary | ICD-10-CM

## 2017-05-16 DIAGNOSIS — Z79899 Other long term (current) drug therapy: Secondary | ICD-10-CM | POA: Diagnosis not present

## 2017-05-16 DIAGNOSIS — Z79891 Long term (current) use of opiate analgesic: Secondary | ICD-10-CM | POA: Insufficient documentation

## 2017-05-16 DIAGNOSIS — Z794 Long term (current) use of insulin: Secondary | ICD-10-CM | POA: Diagnosis not present

## 2017-05-16 DIAGNOSIS — M0579 Rheumatoid arthritis with rheumatoid factor of multiple sites without organ or systems involvement: Secondary | ICD-10-CM | POA: Diagnosis not present

## 2017-05-16 DIAGNOSIS — X58XXXA Exposure to other specified factors, initial encounter: Secondary | ICD-10-CM | POA: Insufficient documentation

## 2017-05-16 LAB — GLUCOSE, POCT (MANUAL RESULT ENTRY)
POC GLUCOSE: 461 mg/dL — AB (ref 70–99)
POC Glucose: 581 mg/dl — AB (ref 70–99)

## 2017-05-16 LAB — POCT URINALYSIS DIPSTICK
Bilirubin, UA: NEGATIVE
Glucose, UA: 500
KETONES UA: NEGATIVE
LEUKOCYTES UA: NEGATIVE
NITRITE UA: NEGATIVE
PH UA: 5.5 (ref 5.0–8.0)
PROTEIN UA: 100
Spec Grav, UA: <=1.005 — AB (ref 1.010–1.025)
UROBILINOGEN UA: 1 U/dL

## 2017-05-16 MED ORDER — INDOMETHACIN 50 MG PO CAPS: 50.0000 mg | ORAL_CAPSULE | Freq: Three times a day (TID) | ORAL | 1 refills | Status: DC | PRN

## 2017-05-16 MED ORDER — INSULIN ASPART 100 UNIT/ML ~~LOC~~ SOLN: 5.0000 [IU] | Freq: Three times a day (TID) | SUBCUTANEOUS | 3 refills | Status: DC

## 2017-05-16 MED ORDER — GABAPENTIN 300 MG PO CAPS
300.0000 mg | ORAL_CAPSULE | Freq: Three times a day (TID) | ORAL | 2 refills | Status: DC
Start: 2017-05-16 — End: 2017-06-12

## 2017-05-16 MED ORDER — SODIUM CHLORIDE 0.9 % IV BOLUS (SEPSIS): 1000.0000 mL | Freq: Once | INTRAVENOUS | Status: DC

## 2017-05-16 MED ORDER — FUROSEMIDE 20 MG PO TABS: 20.0000 mg | ORAL_TABLET | Freq: Every day | ORAL | 0 refills | Status: DC

## 2017-05-16 MED ORDER — MUPIROCIN 2 % EX OINT: 1.0000 "application " | TOPICAL_OINTMENT | Freq: Two times a day (BID) | CUTANEOUS | 0 refills | Status: DC

## 2017-05-16 MED ORDER — SULFAMETHOXAZOLE-TRIMETHOPRIM 800-160 MG PO TABS: 1.0000 | ORAL_TABLET | Freq: Two times a day (BID) | ORAL | 0 refills | Status: DC

## 2017-05-16 MED ORDER — ASPIRIN EC 81 MG PO TBEC: 81.0000 mg | DELAYED_RELEASE_TABLET | Freq: Every day | ORAL | 11 refills | Status: DC

## 2017-05-16 MED ORDER — METHOTREXATE 2.5 MG PO TABS: 7.5000 mg | ORAL_TABLET | ORAL | 0 refills | Status: DC

## 2017-05-16 MED ORDER — ONDANSETRON 4 MG PO TBDP: 4.0000 mg | ORAL_TABLET | Freq: Once | ORAL | Status: DC

## 2017-05-16 MED ORDER — ONDANSETRON HCL 4 MG PO TABS: 4.0000 mg | ORAL_TABLET | Freq: Three times a day (TID) | ORAL | 0 refills | Status: DC | PRN

## 2017-05-16 MED ORDER — PANTOPRAZOLE SODIUM 40 MG PO TBEC: 40.0000 mg | DELAYED_RELEASE_TABLET | Freq: Every day | ORAL | 1 refills | Status: DC

## 2017-05-16 MED ORDER — LISINOPRIL 40 MG PO TABS: 40.0000 mg | ORAL_TABLET | Freq: Every day | ORAL | 2 refills | Status: DC

## 2017-05-16 MED ORDER — CYCLOBENZAPRINE HCL 10 MG PO TABS: 5.0000 mg | ORAL_TABLET | Freq: Three times a day (TID) | ORAL | 1 refills | Status: DC | PRN

## 2017-05-16 MED ORDER — INSULIN ASPART 100 UNIT/ML ~~LOC~~ SOLN
20.0000 [IU] | Freq: Once | SUBCUTANEOUS | Status: AC
  Administered 2017-05-16: 20 [IU] via SUBCUTANEOUS

## 2017-05-16 MED FILL — ?PANTOPRAZOLE SOD DR 40MG: 40 MG | 30 days supply | Qty: 30 | Fill #0

## 2017-05-16 MED FILL — GABAPENTIN 300 MG CAPSULE: 300 | 30 days supply | Qty: 90 | Fill #0

## 2017-05-16 MED FILL — ?ONDANSETRON HCL 4MG TABLET: 4 | 6 days supply | Qty: 20 | Fill #0

## 2017-05-16 MED FILL — ?CYCLOBENZAPRINE 10 MG TABL: 10 | 20 days supply | Qty: 30 | Fill #0

## 2017-05-16 MED FILL — ?FUROSEMIDE 20 MG TABLET: 20 | 30 days supply | Qty: 30 | Fill #0

## 2017-05-16 MED FILL — !NOVOLOG MIX 70/30 VIAL: 70-30/ML | 26 days supply | Qty: 20 | Fill #0

## 2017-05-16 MED FILL — SULFAMETHOXAZOLE-TMP DS TAB: 800-160 | 14 days supply | Qty: 28 | Fill #0

## 2017-05-16 MED FILL — ?METHOTREXATE 2.5 MG TABLET: 2.5 | 56 days supply | Qty: 24 | Fill #0

## 2017-05-16 MED FILL — LISINOPRIL 40 MG TAB: 40 | 30 days supply | Qty: 30 | Fill #0

## 2017-05-16 MED FILL — !NOVOLOG 100UNITS/ML VIAL: 100/ML | 28 days supply | Qty: 10 | Fill #0

## 2017-05-16 MED FILL — INDOMETHACIN 50 MG CAPSULE: 50 | 20 days supply | Qty: 60 | Fill #0

## 2017-05-16 NOTE — Patient Instructions (Signed)
Type 2 Diabetes Mellitus, Diagnosis, Adult Type 2 diabetes (type 2 diabetes mellitus) is a long-term (chronic) disease. It may be caused by one or both of these problems:  Your body does not make enough of a hormone called insulin.  Your body does not react in a normal way to insulin that it makes.  Insulin lets sugars (glucose) go into cells in the body. This gives you energy. If you have type 2 diabetes, sugars cannot get into cells. This causes high blood sugar (hyperglycemia). Your doctor will set treatment goals for you. Generally, you should have these blood sugar levels:  Before meals (preprandial): 80-130 mg/dL (4.4-7.2 mmol/L).  After meals (postprandial): below 180 mg/dL (10 mmol/L).  A1c (hemoglobin A1c) level: less than 7%.  Follow these instructions at home: Questions to Ask Your Doctor  You may want to ask these questions:  Do I need to meet with a diabetes educator?  Where can I find a support group for people with diabetes?  What equipment will I need to care for myself at home?  What diabetes medicines do I need? When should I take them?  How often do I need to check my blood sugar?  What number can I call if I have questions?  When is my next doctor's visit?  General instructions  Take over-the-counter and prescription medicines only as told by your doctor.  Keep all follow-up visits as told by your doctor. This is important. Contact a doctor if:  Your blood sugar is at or above 240 mg/dL (13.3 mmol/L) for 2 days in a row.  You have been sick or have had a fever for 2 days or more and you are not getting better.  You have any of these problems for more than 6 hours: ? You cannot eat or drink. ? You feel sick to your stomach (nauseous). ? You throw up (vomit). ? You have watery poop (diarrhea). Get help right away if:  Your blood sugar is lower than 54 mg/dL (3 mmol/L).  You get confused.  You have trouble: ? Thinking  clearly. ? Breathing.  You have moderate or large ketone levels in your pee (urine). This information is not intended to replace advice given to you by your health care provider. Make sure you discuss any questions you have with your health care provider. Document Released: 02/28/2008 Document Revised: 10/27/2015 Document Reviewed: 06/24/2015 Elsevier Interactive Patient Education  2018 [employer] Inc[employer]   

## 2017-05-16 NOTE — Telephone Encounter (Signed)
Met with the patient when he was in the clinic today for his appointment. The [employer] application was completed and faxed to [employer] eligibility.  He confirmed that he has a glucometer but is not using it as often as he should.  He also reported that he hopes to get food stamps so he would be able to get his own food and the appropriate food and not have to use what his sister purchases.   He also noted that he will pick up any medication that he needs at the [facility] today. His sister dropped him off at the clinic this morning and will be picking him up when his appointment is completed.  

## 2017-05-16 NOTE — Progress Notes (Signed)
Patient is here for f/up  

## 2017-05-16 NOTE — Progress Notes (Signed)
Subjective:  Patient ID: Matthew Riley, male    DOB: Feb 10, 1976  Age: 41 y.o. MRN: [id number]  CC: Diabetes   HPI Matthew Riley presents for follow up. History of recent hospitalization in November 2018 for DKA. PMH includes HTN, DM type 1, CAD w/ stent, GERD, and chronic back pain.  CBG found to be elevated in office. He reports being without his insulin medication for 2 days related to medication cost. Patient denies foot ulcerations, nausea, paresthesia of the feet, visual disturbances, vomitting and weight loss.  Evaluation to date has been included: fasting blood sugar and hemoglobin A1C.  Home sugars: He is not consistent with CBG checks. Treatment to date: NovoLog, Humulin 70/30. Hypertension: He is not adherent to low salt diet. He does not check BP at home. Cardiac symptoms lower extremity edema. Patient denies chest pain, chest pressure/discomfort, claudication, dyspnea, near-syncope, palpitations and syncope.  Cardiovascular risk factors: diabetes mellitus, dyslipidemia, hypertension, male gender and sedentary lifestyle. Use of agents associated with hypertension: none. History of target organ damage: prior coronary revascularization.  History of rheumatoid arthritis.  He complains of arthralgias and myalgias for which has been present for over  2 months. Pain is located in bilateral hands, is described as aching, and is constant .  He reports difficulty being able to grip and open objects.  Associated symptoms include: decreased range of motion, tenderness and stiffness.  Previous RF factor was elevated and patient was prescribed methotrexate. He denies taking methotrexate.  He reports taking tramadol and NSAIDs for symptoms.  Chronic low back pain.  Symptoms include aching. He denies any paresthesias or history of back injury.  Associated symptoms include muscle spasms and weakness.  Previous workup included recent MRI imaging. He reports living with his sister and 2 nieces.  He has been  referred to home health services.    Outpatient Medications Prior to Visit  Medication Sig Dispense Refill  . bacitracin 500 UNIT/GM ointment Apply 1 application 2 (two) times daily topically. For 7 days. 15 g 1  . blood glucose meter kit and supplies Dispense based on patient and insurance preference. Use up to four times daily as directed. (FOR ICD-9 250.00, 250.01). 1 each 0  . cephALEXin (KEFLEX) 500 MG capsule Take 1 capsule (500 mg total) 4 (four) times daily by mouth. (Patient not taking: Reported on 04/23/2017) 20 capsule 0  . insulin aspart protamine- aspart (NOVOLOG MIX 70/30) (70-30) 100 UNIT/ML injection Inject 0.38 mLs (38 Units total) into the skin 2 (two) times daily with a meal. 30 mL 8  . Insulin Syringe-Needle U-100 (INSULIN SYRINGE .5CC/31GX5/16") 31G X 5/16" 0.5 ML MISC Use as needed to inject insulin as instructed 300 each 0  . traMADol (ULTRAM) 50 MG tablet Take 1 tablet (50 mg total) by mouth every 8 (eight) hours as needed for severe pain. 30 tablet 0  . aspirin EC 81 MG tablet Take 1 tablet (81 mg total) daily by mouth. 30 tablet 2  . cyclobenzaprine (FLEXERIL) 10 MG tablet Take 0.5 tablets (5 mg total) 3 (three) times daily as needed by mouth for muscle spasms. 30 tablet 0  . furosemide (LASIX) 20 MG tablet Take 1 tablet (20 mg total) daily by mouth. 30 tablet 0  . gabapentin (NEURONTIN) 100 MG capsule Take 2 capsules (200 mg total) 3 (three) times daily by mouth. 180 capsule 1  . indomethacin (INDOCIN) 50 MG capsule Take 1 capsule (50 mg total) by mouth 2 (two) times daily as needed for moderate  pain. Take with a meal. 60 capsule 1  . insulin aspart (NOVOLOG) 100 UNIT/ML injection Inject 5 Units 3 (three) times daily with meals into the skin. 10 mL 3  . lisinopril (PRINIVIL,ZESTRIL) 20 MG tablet Take 1 tablet (20 mg total) by mouth 2 (two) times daily. 60 tablet 3  . methotrexate (RHEUMATREX) 2.5 MG tablet Take 3 tablets (7.5 mg total) by mouth once a week.  Caution:Chemotherapy. Protect from light. 24 tablet 0  . pantoprazole (PROTONIX) 40 MG tablet Take 1 tablet (40 mg total) by mouth daily. 30 tablet 1  . promethazine (PHENERGAN) 25 MG tablet Take 1 tablet (25 mg total) by mouth every 8 (eight) hours as needed for nausea, vomiting or refractory nausea / vomiting. 20 tablet 0   No facility-administered medications prior to visit.     ROS Review of Systems  Eyes: Negative.   Respiratory: Negative.   Cardiovascular: Positive for leg swelling.  Gastrointestinal: Negative.   Musculoskeletal: Positive for arthralgias, back pain and myalgias.  Skin: Positive for wound.  Neurological: Positive for weakness.    Objective:  BP (!) 135/92 (BP Location: Left Arm, Patient Position: Sitting, Cuff Size: Normal)   Pulse 98   Temp (!) 97.5 F (36.4 C) (Oral)   Resp 18   Ht '6\' 1"'  (1.854 m)   Wt 206 lb 9.6 oz (93.7 kg)   SpO2 100%   BMI 27.26 kg/m   BP/Weight 05/16/2017 04/23/2017 73/42/8768  Systolic BP 115 726 203  Diastolic BP 92 559 81  Wt. (Lbs) 206.6 210.8 -  BMI 27.26 27.81 -     Physical Exam  Constitutional: He appears well-developed and well-nourished.  Eyes: Conjunctivae are normal. Pupils are equal, round, and reactive to light.  Neck: No JVD present.  Cardiovascular: Normal rate, regular rhythm, normal heart sounds and intact distal pulses.  BLE 1+  Pulmonary/Chest: Effort normal and breath sounds normal.  Abdominal: Soft. Bowel sounds are normal. There is no tenderness.  Musculoskeletal:       Lumbar back: He exhibits pain.       Right hand: He exhibits normal capillary refill. Decreased strength noted.       Left hand: He exhibits normal capillary refill. Decreased strength noted.  Ambulates with assistance of walker.  Skin: Skin is warm and dry. Abrasion (multiple sites to bilateral upper and lower extremities, largest abrasions found posterior lower leg and right lateral forearm.) noted.     Psychiatric: He exhibits  a depressed mood. He expresses no homicidal and no suicidal ideation. He expresses no suicidal plans and no homicidal plans.  Nursing note and vitals reviewed.    Assessment & Plan:   1. Uncontrolled type 1 diabetes mellitus with hyperglycemia (HCC) Insulin and IV fluids given, and IV d/c. - Glucose (CBG) - Urinalysis Dipstick - insulin aspart (novoLOG) injection 20 Units - gabapentin (NEURONTIN) 300 MG capsule; Take 1 capsule (300 mg total) by mouth 3 (three) times daily.  Dispense: 90 capsule; Refill: 2 - insulin aspart (NOVOLOG) 100 UNIT/ML injection; Inject 5 Units into the skin 3 (three) times daily with meals.  Dispense: 10 mL; Refill: 3 - Glucose (CBG) - Insert peripheral IV - sodium chloride 0.9 % bolus 1,000 mL  2. Abrasions of multiple sites  - AMB referral to wound care center  3. Chronic midline low back pain without sciatica  - cyclobenzaprine (FLEXERIL) 10 MG tablet; Take 0.5 tablets (5 mg total) by mouth 3 (three) times daily as needed for muscle spasms.  Dispense: 30 tablet; Refill: 1 - indomethacin (INDOCIN) 50 MG capsule; Take 1 capsule (50 mg total) by mouth 3 (three) times daily as needed for moderate pain. Take with a meal.  Dispense: 60 capsule; Refill: 1  4. Muscle spasm  - cyclobenzaprine (FLEXERIL) 10 MG tablet; Take 0.5 tablets (5 mg total) by mouth 3 (three) times daily as needed for muscle spasms.  Dispense: 30 tablet; Refill: 1  5. Rheumatoid arthritis involving multiple sites with positive rheumatoid factor (HCC)  - indomethacin (INDOCIN) 50 MG capsule; Take 1 capsule (50 mg total) by mouth 3 (three) times daily as needed for moderate pain. Take with a meal.  Dispense: 60 capsule; Refill: 1 - methotrexate (RHEUMATREX) 2.5 MG tablet; Take 3 tablets (7.5 mg total) by mouth once a week. Caution:Chemotherapy. Protect from light.  Dispense: 24 tablet; Refill: 0  6. Essential hypertension  - aspirin EC 81 MG tablet; Take 1 tablet (81 mg total) by mouth  daily.  Dispense: 30 tablet; Refill: 11 - lisinopril (PRINIVIL,ZESTRIL) 40 MG tablet; Take 1 tablet (40 mg total) by mouth daily.  Dispense: 30 tablet; Refill: 2 - Basic metabolic panel  7. Bilateral leg edema  - furosemide (LASIX) 20 MG tablet; Take 1 tablet (20 mg total) by mouth daily.  Dispense: 30 tablet; Refill: 0  8. Nausea  - pantoprazole (PROTONIX) 40 MG tablet; Take 1 tablet (40 mg total) by mouth daily.  Dispense: 30 tablet; Refill: 1 - ondansetron (ZOFRAN) 4 MG tablet; Take 1 tablet (4 mg total) by mouth every 8 (eight) hours as needed for nausea.  Dispense: 20 tablet; Refill: 0  9. Ulcer of left lower extremity, limited to breakdown of skin (HCC)  - AMB referral to wound care center - sulfamethoxazole-trimethoprim (BACTRIM DS,SEPTRA DS) 800-160 MG tablet; Take 1 tablet by mouth 2 (two) times daily.  Dispense: 28 tablet; Refill: 0     Follow-up: Return in about 2 months (around 07/17/2017), or if symptoms worsen or fail to improve, for DM/HTN.   Alfonse Spruce FNP

## 2017-05-16 NOTE — Telephone Encounter (Signed)
Met with patient during his office visit with provider today. Patient informed me that home health nurse and physical therapist have been out to his home, but that he has one visit left for physical therapy. Pt will contact [facility] to request more visits for physical therapy.   Patient also stated that he has filled out the application for Medicaid and food stamps but wasn't sure where the Medicaid office is. Clarified to him that it's not the same office as the Social security office where he has his disability appointment. Patient understood. Gave patient the address and phone number to the [facility] office.   Helped patient finish filling out his application for [employer] and informed me that we would fax it to them. Informed him that it takes about 3 weeks (or 4) especially the holidays. Also informed him that he will receive a call from [employer] to get his assessment.   Pt was also interested in applying for the [facility] card. Spoke briefly with Diane, Development worker, community, to verify if patient can apply for [place] since he will apply for Medicaid. Diane said yes but I needed to make sure and ask patient if he has lived in [facility] for the last 90 days. When I asked patient he said yes. Gave patient the application for the [employer] and asked him to make an appointment with financial counselor on his way out.   Patient also informed me that he has a glucometer at home. He wasn't clear on whether he used it on a daily basis or not.

## 2017-05-17 ENCOUNTER — Telehealth: Payer: Self-pay | Admitting: Family Medicine

## 2017-05-17 ENCOUNTER — Other Ambulatory Visit: Payer: Self-pay | Admitting: Family Medicine

## 2017-05-17 DIAGNOSIS — T07XXXA Unspecified multiple injuries, initial encounter: Secondary | ICD-10-CM

## 2017-05-17 MED ORDER — MUPIROCIN 2 % EX OINT: 1.0000 "application " | TOPICAL_OINTMENT | Freq: Two times a day (BID) | CUTANEOUS | 0 refills | Status: DC

## 2017-05-17 NOTE — Telephone Encounter (Signed)
Call placed to [employer] #[phone] to check if [employer] application was received. Spoke with Victorino Dike and she transferred me to Matthew Riley. No answer on Matthew Riley's extension. Left her a message asking her to return my call at [phone].

## 2017-05-17 NOTE — Telephone Encounter (Signed)
Call placed to patient #[phone], to check on his status and to help him schedule a follow up appointment and an appointment with financial counselor. No answer. Left message for patient to return my call at [phone].

## 2017-05-21 ENCOUNTER — Telehealth: Payer: Self-pay | Admitting: Family Medicine

## 2017-05-21 NOTE — Telephone Encounter (Signed)
Pt called returning your call, please call him back °

## 2017-05-21 NOTE — Telephone Encounter (Signed)
Call placed to patient on both numbers. No answer on his cell and house number. Left message asking patient to return my call.   Call place to work number and spoke with patient's sister Franne Grip and she stated that she will let patient know that I called and have him return my call.

## 2017-05-21 NOTE — Telephone Encounter (Signed)
Call placed to Milus Mallick from [employer] #[phone] to check on the status of patient's application. No answer. Left Delorise Shiner a message asking her to return my call. Delorise Shiner can speak with Erskine Squibb (case Production designer, theatre/television/film).

## 2017-05-23 NOTE — Telephone Encounter (Signed)
Returned patient's call from yesterday. No answer. Left patient a message to return my call at [phone].   Need to help patient with scheduling a follow up appointment with provider as well as schedule an appointment with financial counselor.

## 2017-05-29 ENCOUNTER — Telehealth: Payer: Self-pay | Admitting: Family Medicine

## 2017-05-29 NOTE — Telephone Encounter (Signed)
Call placed to patient #[phone] regarding his [employer] application. Patient informed me that he hasn't received a call from [employer]. I informed patient that they did receive his application but may be behind on scheduling assessments because of the holidays. Patient understood and had no further questions. Also informed patient of upcoming appointments.

## 2017-05-30 ENCOUNTER — Telehealth: Payer: Self-pay | Admitting: Family Medicine

## 2017-05-30 NOTE — Telephone Encounter (Signed)
Call placed to [employer] #[phone] regarding patient's application. Informed the receptionist that I have tried to contact Matthew Riley regarding patient's application on different occasions and no one has returned my call. Asked to speak with someone else. Receptionist just transferred me to Ms. Robinson's extension. No answer. Left message asking for a call back on [phone].

## 2017-05-31 ENCOUNTER — Telehealth: Payer: Self-pay | Admitting: Family Medicine

## 2017-05-31 NOTE — Telephone Encounter (Signed)
Returned Matthew Riley's call from yesterday regarding patient's [employer] application. I was not in the office at that time. Called the number she asked to call back at #[phone]. No one answered and call dropped after several rings. Called the main line #[phone] and spoke with Jennifer, she transferred me to Ms. Riley's extension. No answer. Left a message letting her know that I was returning her call and that I would be in the office today until 5:30pm. Also left my call back number [phone].  °

## 2017-06-03 ENCOUNTER — Emergency Department (HOSPITAL_COMMUNITY): Payer: Medicaid Other

## 2017-06-03 ENCOUNTER — Inpatient Hospital Stay (HOSPITAL_COMMUNITY): Payer: Medicaid Other

## 2017-06-03 ENCOUNTER — Encounter (HOSPITAL_COMMUNITY): Payer: Self-pay | Admitting: Emergency Medicine

## 2017-06-03 ENCOUNTER — Inpatient Hospital Stay (HOSPITAL_COMMUNITY)
Admission: EM | Admit: 2017-06-03 | Discharge: 2017-06-12 | DRG: 870 | Disposition: A | Discharge status: Rehabilitation Facility | Payer: Medicaid Other | Attending: Internal Medicine | Admitting: Internal Medicine

## 2017-06-03 ENCOUNTER — Other Ambulatory Visit: Payer: Self-pay

## 2017-06-03 DIAGNOSIS — J9601 Acute respiratory failure with hypoxia: Secondary | ICD-10-CM | POA: Diagnosis not present

## 2017-06-03 DIAGNOSIS — R197 Diarrhea, unspecified: Secondary | ICD-10-CM | POA: Diagnosis present

## 2017-06-03 DIAGNOSIS — G9341 Metabolic encephalopathy: Secondary | ICD-10-CM | POA: Diagnosis present

## 2017-06-03 DIAGNOSIS — R7401 Elevation of levels of liver transaminase levels: Secondary | ICD-10-CM

## 2017-06-03 DIAGNOSIS — E10649 Type 1 diabetes mellitus with hypoglycemia without coma: Secondary | ICD-10-CM | POA: Diagnosis not present

## 2017-06-03 DIAGNOSIS — E10622 Type 1 diabetes mellitus with other skin ulcer: Secondary | ICD-10-CM | POA: Diagnosis present

## 2017-06-03 DIAGNOSIS — R569 Unspecified convulsions: Secondary | ICD-10-CM | POA: Diagnosis present

## 2017-06-03 DIAGNOSIS — R0902 Hypoxemia: Secondary | ICD-10-CM

## 2017-06-03 DIAGNOSIS — R5381 Other malaise: Secondary | ICD-10-CM | POA: Diagnosis not present

## 2017-06-03 DIAGNOSIS — M545 Low back pain: Secondary | ICD-10-CM | POA: Diagnosis present

## 2017-06-03 DIAGNOSIS — E876 Hypokalemia: Secondary | ICD-10-CM | POA: Diagnosis present

## 2017-06-03 DIAGNOSIS — E871 Hypo-osmolality and hyponatremia: Secondary | ICD-10-CM | POA: Diagnosis present

## 2017-06-03 DIAGNOSIS — L97929 Non-pressure chronic ulcer of unspecified part of left lower leg with unspecified severity: Secondary | ICD-10-CM | POA: Diagnosis present

## 2017-06-03 DIAGNOSIS — Z87891 Personal history of nicotine dependence: Secondary | ICD-10-CM

## 2017-06-03 DIAGNOSIS — Z4659 Encounter for fitting and adjustment of other gastrointestinal appliance and device: Secondary | ICD-10-CM

## 2017-06-03 DIAGNOSIS — R112 Nausea with vomiting, unspecified: Secondary | ICD-10-CM

## 2017-06-03 DIAGNOSIS — M069 Rheumatoid arthritis, unspecified: Secondary | ICD-10-CM

## 2017-06-03 DIAGNOSIS — K219 Gastro-esophageal reflux disease without esophagitis: Secondary | ICD-10-CM | POA: Diagnosis present

## 2017-06-03 DIAGNOSIS — J189 Pneumonia, unspecified organism: Secondary | ICD-10-CM | POA: Diagnosis not present

## 2017-06-03 DIAGNOSIS — Z955 Presence of coronary angioplasty implant and graft: Secondary | ICD-10-CM

## 2017-06-03 DIAGNOSIS — R55 Syncope and collapse: Secondary | ICD-10-CM | POA: Diagnosis not present

## 2017-06-03 DIAGNOSIS — J690 Pneumonitis due to inhalation of food and vomit: Secondary | ICD-10-CM | POA: Diagnosis present

## 2017-06-03 DIAGNOSIS — R6521 Severe sepsis with septic shock: Secondary | ICD-10-CM | POA: Diagnosis present

## 2017-06-03 DIAGNOSIS — Z452 Encounter for adjustment and management of vascular access device: Secondary | ICD-10-CM

## 2017-06-03 DIAGNOSIS — N179 Acute kidney failure, unspecified: Secondary | ICD-10-CM | POA: Diagnosis present

## 2017-06-03 DIAGNOSIS — Z8673 Personal history of transient ischemic attack (TIA), and cerebral infarction without residual deficits: Secondary | ICD-10-CM

## 2017-06-03 DIAGNOSIS — Z794 Long term (current) use of insulin: Secondary | ICD-10-CM

## 2017-06-03 DIAGNOSIS — I110 Hypertensive heart disease with heart failure: Secondary | ICD-10-CM | POA: Diagnosis present

## 2017-06-03 DIAGNOSIS — Z7982 Long term (current) use of aspirin: Secondary | ICD-10-CM | POA: Diagnosis not present

## 2017-06-03 DIAGNOSIS — R109 Unspecified abdominal pain: Secondary | ICD-10-CM | POA: Diagnosis not present

## 2017-06-03 DIAGNOSIS — I5021 Acute systolic (congestive) heart failure: Secondary | ICD-10-CM | POA: Diagnosis present

## 2017-06-03 DIAGNOSIS — A419 Sepsis, unspecified organism: Secondary | ICD-10-CM | POA: Diagnosis present

## 2017-06-03 DIAGNOSIS — G8929 Other chronic pain: Secondary | ICD-10-CM | POA: Diagnosis present

## 2017-06-03 DIAGNOSIS — I639 Cerebral infarction, unspecified: Secondary | ICD-10-CM | POA: Diagnosis not present

## 2017-06-03 DIAGNOSIS — L97919 Non-pressure chronic ulcer of unspecified part of right lower leg with unspecified severity: Secondary | ICD-10-CM

## 2017-06-03 DIAGNOSIS — A408 Other streptococcal sepsis: Secondary | ICD-10-CM | POA: Diagnosis present

## 2017-06-03 DIAGNOSIS — R059 Cough, unspecified: Secondary | ICD-10-CM

## 2017-06-03 DIAGNOSIS — D72829 Elevated white blood cell count, unspecified: Secondary | ICD-10-CM

## 2017-06-03 DIAGNOSIS — I2510 Atherosclerotic heart disease of native coronary artery without angina pectoris: Secondary | ICD-10-CM | POA: Diagnosis present

## 2017-06-03 DIAGNOSIS — G9340 Encephalopathy, unspecified: Secondary | ICD-10-CM | POA: Diagnosis present

## 2017-06-03 DIAGNOSIS — R0602 Shortness of breath: Secondary | ICD-10-CM | POA: Diagnosis not present

## 2017-06-03 DIAGNOSIS — R0603 Acute respiratory distress: Secondary | ICD-10-CM | POA: Diagnosis not present

## 2017-06-03 DIAGNOSIS — Z9911 Dependence on respirator [ventilator] status: Secondary | ICD-10-CM

## 2017-06-03 DIAGNOSIS — I509 Heart failure, unspecified: Secondary | ICD-10-CM

## 2017-06-03 DIAGNOSIS — R740 Nonspecific elevation of levels of transaminase and lactic acid dehydrogenase [LDH]: Secondary | ICD-10-CM

## 2017-06-03 DIAGNOSIS — M792 Neuralgia and neuritis, unspecified: Secondary | ICD-10-CM

## 2017-06-03 DIAGNOSIS — E1042 Type 1 diabetes mellitus with diabetic polyneuropathy: Secondary | ICD-10-CM

## 2017-06-03 DIAGNOSIS — R05 Cough: Secondary | ICD-10-CM

## 2017-06-03 LAB — POCT I-STAT 3, ART BLOOD GAS (G3+)
ACID-BASE DEFICIT: 10 mmol/L — AB (ref 0.0–2.0)
Acid-base deficit: 8 mmol/L — ABNORMAL HIGH (ref 0.0–2.0)
Acid-base deficit: 8 mmol/L — ABNORMAL HIGH (ref 0.0–2.0)
BICARBONATE: 16.5 mmol/L — AB (ref 20.0–28.0)
Bicarbonate: 17.3 mmol/L — ABNORMAL LOW (ref 20.0–28.0)
Bicarbonate: 20.1 mmol/L (ref 20.0–28.0)
O2 SAT: 37 %
O2 SAT: 87 %
O2 Saturation: 92 %
PCO2 ART: 37.5 mmHg (ref 32.0–48.0)
PCO2 ART: 53.9 mmHg — AB (ref 32.0–48.0)
PH ART: 7.189 — AB (ref 7.350–7.450)
PH ART: 7.279 — AB (ref 7.350–7.450)
Patient temperature: 38.7
Patient temperature: 38.7
TCO2: 18 mmol/L — ABNORMAL LOW (ref 22–32)
TCO2: 22 mmol/L (ref 22–32)
TCO2: 18 mmol/L — AB (ref 22–32)
pCO2 arterial: 39.8 mmHg (ref 32.0–48.0)
pH, Arterial: 7.231 — ABNORMAL LOW (ref 7.350–7.450)
pO2, Arterial: 30 mmHg — CL (ref 83.0–108.0)
pO2, Arterial: 65 mmHg — ABNORMAL LOW (ref 83.0–108.0)
pO2, Arterial: 82 mmHg — ABNORMAL LOW (ref 83.0–108.0)

## 2017-06-03 LAB — COMPREHENSIVE METABOLIC PANEL
ALBUMIN: 3.2 g/dL — AB (ref 3.5–5.0)
ALT: 233 U/L — AB (ref 17–63)
AST: 324 U/L — AB (ref 15–41)
Alkaline Phosphatase: 154 U/L — ABNORMAL HIGH (ref 38–126)
Anion gap: 13 (ref 5–15)
BUN: 34 mg/dL — AB (ref 6–20)
CHLORIDE: 102 mmol/L (ref 101–111)
CO2: 14 mmol/L — AB (ref 22–32)
CREATININE: 2.34 mg/dL — AB (ref 0.61–1.24)
Calcium: 8.5 mg/dL — ABNORMAL LOW (ref 8.9–10.3)
GFR calc Af Amer: 38 mL/min — ABNORMAL LOW (ref >60)
GFR calc non Af Amer: 33 mL/min — ABNORMAL LOW (ref >60)
Glucose, Bld: 234 mg/dL — ABNORMAL HIGH (ref 65–99)
Potassium: 4.5 mmol/L (ref 3.5–5.1)
SODIUM: 129 mmol/L — AB (ref 135–145)
Total Bilirubin: 0.8 mg/dL (ref 0.3–1.2)
Total Protein: 9.4 g/dL — ABNORMAL HIGH (ref 6.5–8.1)

## 2017-06-03 LAB — RESPIRATORY PANEL BY PCR
Adenovirus: NOT DETECTED
Bordetella pertussis: NOT DETECTED
CORONAVIRUS OC43-RVPPCR: NOT DETECTED
Chlamydophila pneumoniae: NOT DETECTED
Coronavirus 229E: NOT DETECTED
Coronavirus HKU1: NOT DETECTED
Coronavirus NL63: NOT DETECTED
INFLUENZA A-RVPPCR: NOT DETECTED
INFLUENZA B-RVPPCR: NOT DETECTED
METAPNEUMOVIRUS-RVPPCR: NOT DETECTED
Mycoplasma pneumoniae: NOT DETECTED
PARAINFLUENZA VIRUS 1-RVPPCR: NOT DETECTED
PARAINFLUENZA VIRUS 2-RVPPCR: NOT DETECTED
PARAINFLUENZA VIRUS 4-RVPPCR: NOT DETECTED
Parainfluenza Virus 3: NOT DETECTED
RESPIRATORY SYNCYTIAL VIRUS-RVPPCR: NOT DETECTED
RHINOVIRUS / ENTEROVIRUS - RVPPCR: NOT DETECTED

## 2017-06-03 LAB — CBC
HCT: 41.4 % (ref 39.0–52.0)
Hemoglobin: 14.2 g/dL (ref 13.0–17.0)
MCH: 30.4 pg (ref 26.0–34.0)
MCHC: 34.3 g/dL (ref 30.0–36.0)
MCV: 88.7 fL (ref 78.0–100.0)
PLATELETS: 192 10*3/uL (ref 150–400)
RBC: 4.67 MIL/uL (ref 4.22–5.81)
RDW: 13.6 % (ref 11.5–15.5)
WBC: 8.9 10*3/uL (ref 4.0–10.5)

## 2017-06-03 LAB — I-STAT CHEM 8, ED
BUN: 50 mg/dL — ABNORMAL HIGH (ref 6–20)
CALCIUM ION: 0.98 mmol/L — AB (ref 1.15–1.40)
CHLORIDE: 109 mmol/L (ref 101–111)
Creatinine, Ser: 2.4 mg/dL — ABNORMAL HIGH (ref 0.61–1.24)
GLUCOSE: 239 mg/dL — AB (ref 65–99)
HCT: 48 % (ref 39.0–52.0)
Hemoglobin: 16.3 g/dL (ref 13.0–17.0)
Potassium: 5.3 mmol/L — ABNORMAL HIGH (ref 3.5–5.1)
Sodium: 135 mmol/L (ref 135–145)
TCO2: 17 mmol/L — AB (ref 22–32)

## 2017-06-03 LAB — BLOOD CULTURE ID PANEL (REFLEXED)
Acinetobacter baumannii: NOT DETECTED
CANDIDA PARAPSILOSIS: NOT DETECTED
CANDIDA TROPICALIS: NOT DETECTED
CARBAPENEM RESISTANCE: NOT DETECTED
Candida albicans: NOT DETECTED
Candida glabrata: NOT DETECTED
Candida krusei: NOT DETECTED
ENTEROBACTERIACEAE SPECIES: NOT DETECTED
Enterobacter cloacae complex: NOT DETECTED
Enterococcus species: NOT DETECTED
Escherichia coli: NOT DETECTED
HAEMOPHILUS INFLUENZAE: NOT DETECTED
KLEBSIELLA PNEUMONIAE: NOT DETECTED
Klebsiella oxytoca: NOT DETECTED
Listeria monocytogenes: NOT DETECTED
Neisseria meningitidis: NOT DETECTED
PROTEUS SPECIES: NOT DETECTED
Pseudomonas aeruginosa: NOT DETECTED
STAPHYLOCOCCUS AUREUS BCID: NOT DETECTED
STREPTOCOCCUS SPECIES: DETECTED — AB
Serratia marcescens: NOT DETECTED
Staphylococcus species: NOT DETECTED
Streptococcus agalactiae: NOT DETECTED
Streptococcus pneumoniae: NOT DETECTED
Streptococcus pyogenes: NOT DETECTED

## 2017-06-03 LAB — URINALYSIS, ROUTINE W REFLEX MICROSCOPIC
BILIRUBIN URINE: NEGATIVE
KETONES UR: NEGATIVE mg/dL
LEUKOCYTES UA: NEGATIVE
NITRITE: NEGATIVE
PROTEIN: 30 mg/dL — AB
Specific Gravity, Urine: 1.021 (ref 1.005–1.030)
pH: 5 (ref 5.0–8.0)

## 2017-06-03 LAB — LACTIC ACID, PLASMA
LACTIC ACID, VENOUS: 4.3 mmol/L — AB (ref 0.5–1.9)
Lactic Acid, Venous: 1.4 mmol/L (ref 0.5–1.9)

## 2017-06-03 LAB — GLUCOSE, CAPILLARY
GLUCOSE-CAPILLARY: 188 mg/dL — AB (ref 65–99)
Glucose-Capillary: 192 mg/dL — ABNORMAL HIGH (ref 65–99)
Glucose-Capillary: 167 mg/dL — ABNORMAL HIGH (ref 65–99)
Glucose-Capillary: 132 mg/dL — ABNORMAL HIGH (ref 65–99)
Glucose-Capillary: 157 mg/dL — ABNORMAL HIGH (ref 65–99)
Glucose-Capillary: 200 mg/dL — ABNORMAL HIGH (ref 65–99)

## 2017-06-03 LAB — PHOSPHORUS: Phosphorus: 5.1 mg/dL — ABNORMAL HIGH (ref 2.5–4.6)

## 2017-06-03 LAB — RAPID URINE DRUG SCREEN, HOSP PERFORMED
AMPHETAMINES: NOT DETECTED
BARBITURATES: NOT DETECTED
Benzodiazepines: POSITIVE — AB
Cocaine: NOT DETECTED
Opiates: NOT DETECTED
TETRAHYDROCANNABINOL: POSITIVE — AB

## 2017-06-03 LAB — C DIFFICILE QUICK SCREEN W PCR REFLEX
C DIFFICLE (CDIFF) ANTIGEN: NEGATIVE
C Diff interpretation: NOT DETECTED
C Diff toxin: NEGATIVE

## 2017-06-03 LAB — LIPASE, BLOOD: Lipase: 43 U/L (ref 11–51)

## 2017-06-03 LAB — BASIC METABOLIC PANEL
Anion gap: 9 (ref 5–15)
BUN: 34 mg/dL — AB (ref 6–20)
CHLORIDE: 107 mmol/L (ref 101–111)
CO2: 18 mmol/L — AB (ref 22–32)
CREATININE: 2.26 mg/dL — AB (ref 0.61–1.24)
Calcium: 7.5 mg/dL — ABNORMAL LOW (ref 8.9–10.3)
GFR calc Af Amer: 39 mL/min — ABNORMAL LOW (ref >60)
GFR calc non Af Amer: 34 mL/min — ABNORMAL LOW (ref >60)
GLUCOSE: 178 mg/dL — AB (ref 65–99)
Potassium: 4.1 mmol/L (ref 3.5–5.1)
Sodium: 134 mmol/L — ABNORMAL LOW (ref 135–145)

## 2017-06-03 LAB — AMYLASE: Amylase: 205 U/L — ABNORMAL HIGH (ref 28–100)

## 2017-06-03 LAB — I-STAT ARTERIAL BLOOD GAS, ED
ACID-BASE DEFICIT: 10 mmol/L — AB (ref 0.0–2.0)
ACID-BASE DEFICIT: 11 mmol/L — AB (ref 0.0–2.0)
BICARBONATE: 17.3 mmol/L — AB (ref 20.0–28.0)
Bicarbonate: 18.5 mmol/L — ABNORMAL LOW (ref 20.0–28.0)
O2 SAT: 74 %
O2 Saturation: 95 %
PCO2 ART: 49.4 mmHg — AB (ref 32.0–48.0)
PH ART: 7.173 — AB (ref 7.350–7.450)
Patient temperature: 96.1
TCO2: 20 mmol/L — AB (ref 22–32)
TCO2: 19 mmol/L — AB (ref 22–32)
pCO2 arterial: 47.4 mmHg (ref 32.0–48.0)
pH, Arterial: 7.165 — CL (ref 7.350–7.450)
pO2, Arterial: 46 mmHg — ABNORMAL LOW (ref 83.0–108.0)
pO2, Arterial: 90 mmHg (ref 83.0–108.0)

## 2017-06-03 LAB — CBG MONITORING, ED: Glucose-Capillary: 218 mg/dL — ABNORMAL HIGH (ref 65–99)

## 2017-06-03 LAB — SALICYLATE LEVEL

## 2017-06-03 LAB — I-STAT CG4 LACTIC ACID, ED: Lactic Acid, Venous: 5.07 mmol/L (ref 0.5–1.9)

## 2017-06-03 LAB — MRSA PCR SCREENING: MRSA BY PCR: NEGATIVE

## 2017-06-03 LAB — I-STAT TROPONIN, ED: TROPONIN I, POC: 0.01 ng/mL (ref 0.00–0.08)

## 2017-06-03 LAB — ETHANOL: Alcohol, Ethyl (B): <10 mg/dL (ref <10)

## 2017-06-03 LAB — ECHOCARDIOGRAM COMPLETE
Height: 73 in
Weight: 3296 oz

## 2017-06-03 LAB — ACETAMINOPHEN LEVEL

## 2017-06-03 LAB — INFLUENZA PANEL BY PCR (TYPE A & B)
Influenza A By PCR: NEGATIVE
Influenza B By PCR: NEGATIVE

## 2017-06-03 LAB — MAGNESIUM: Magnesium: 1.4 mg/dL — ABNORMAL LOW (ref 1.7–2.4)

## 2017-06-03 LAB — PROCALCITONIN: Procalcitonin: 9.42 ng/mL

## 2017-06-03 MED ORDER — SODIUM CHLORIDE 0.9 % IV BOLUS (SEPSIS)
2000.0000 mL | Freq: Once | INTRAVENOUS | Status: AC
  Administered 2017-06-03: 2000 mL via INTRAVENOUS

## 2017-06-03 MED ORDER — FENTANYL CITRATE (PF) 100 MCG/2ML IJ SOLN: 50.0000 ug | Freq: Once | INTRAMUSCULAR | Status: DC

## 2017-06-03 MED ORDER — SODIUM CHLORIDE 0.9 % IV SOLN: INTRAVENOUS | Status: DC

## 2017-06-03 MED ORDER — SODIUM CHLORIDE 0.9 % IV BOLUS (SEPSIS): 1000.0000 mL | Freq: Once | INTRAVENOUS | Status: DC

## 2017-06-03 MED ORDER — FENTANYL CITRATE (PF) 100 MCG/2ML IJ SOLN
INTRAMUSCULAR | Status: AC
  Filled 2017-06-03: qty 2

## 2017-06-03 MED ORDER — FENTANYL CITRATE (PF) 100 MCG/2ML IJ SOLN: 100.0000 ug | INTRAMUSCULAR | Status: DC | PRN

## 2017-06-03 MED ORDER — VANCOMYCIN HCL 10 G IV SOLR
1500.0000 mg | Freq: Once | INTRAVENOUS | Status: AC
  Administered 2017-06-03: 1500 mg via INTRAVENOUS
  Filled 2017-06-03: qty 1500

## 2017-06-03 MED ORDER — ACETAMINOPHEN 160 MG/5ML PO SOLN
500.0000 mg | ORAL | Status: DC | PRN
Start: 2017-06-03 — End: 2017-06-03

## 2017-06-03 MED ORDER — DEXTROSE 5 % IV SOLN
0.0000 ug/min | Freq: Once | INTRAVENOUS | Status: AC
  Administered 2017-06-03: 10 ug/min via INTRAVENOUS
  Filled 2017-06-03: qty 4

## 2017-06-03 MED ORDER — PIPERACILLIN-TAZOBACTAM 3.375 G IVPB 30 MIN
3.3750 g | Freq: Once | INTRAVENOUS | Status: AC
  Administered 2017-06-03: 3.375 g via INTRAVENOUS
  Filled 2017-06-03: qty 50

## 2017-06-03 MED ORDER — MIDAZOLAM HCL 2 MG/2ML IJ SOLN
INTRAMUSCULAR | Status: AC
  Filled 2017-06-03: qty 4

## 2017-06-03 MED ORDER — FENTANYL CITRATE (PF) 100 MCG/2ML IJ SOLN
INTRAMUSCULAR | Status: AC
  Administered 2017-06-03: 100 ug
  Filled 2017-06-03: qty 2

## 2017-06-03 MED ORDER — ASPIRIN 81 MG PO CHEW
81.0000 mg | CHEWABLE_TABLET | Freq: Every day | ORAL | Status: DC
  Administered 2017-06-03 – 2017-06-12 (×10): 81 mg
  Filled 2017-06-03 (×10): qty 1

## 2017-06-03 MED ORDER — SODIUM CHLORIDE 0.9 % IV BOLUS (SEPSIS)
1000.0000 mL | Freq: Once | INTRAVENOUS | Status: AC
  Administered 2017-06-03: 1000 mL via INTRAVENOUS

## 2017-06-03 MED ORDER — SODIUM CHLORIDE 0.9 % IV SOLN: 250.0000 mL | INTRAVENOUS | Status: DC | PRN

## 2017-06-03 MED ORDER — ORAL CARE MOUTH RINSE
15.0000 mL | OROMUCOSAL | Status: DC
  Administered 2017-06-03 – 2017-06-07 (×45): 15 mL via OROMUCOSAL

## 2017-06-03 MED ORDER — LACTATED RINGERS IV SOLN
INTRAVENOUS | Status: DC
  Administered 2017-06-03 – 2017-06-08 (×6): via INTRAVENOUS

## 2017-06-03 MED ORDER — MIDAZOLAM HCL 5 MG/5ML IJ SOLN
INTRAMUSCULAR | Status: AC | PRN
  Administered 2017-06-03: 4 mg via INTRAVENOUS

## 2017-06-03 MED ORDER — FENTANYL CITRATE (PF) 100 MCG/2ML IJ SOLN
50.0000 ug | Freq: Once | INTRAMUSCULAR | Status: AC
  Administered 2017-06-03: 50 ug via INTRAVENOUS

## 2017-06-03 MED ORDER — INSULIN ASPART 100 UNIT/ML ~~LOC~~ SOLN
2.0000 [IU] | SUBCUTANEOUS | Status: DC
  Administered 2017-06-03 (×2): 4 [IU] via SUBCUTANEOUS
  Administered 2017-06-03: 2 [IU] via SUBCUTANEOUS
  Administered 2017-06-03 (×2): 4 [IU] via SUBCUTANEOUS
  Administered 2017-06-03: 2 [IU] via SUBCUTANEOUS
  Administered 2017-06-04: 6 [IU] via SUBCUTANEOUS
  Administered 2017-06-04: 2 [IU] via SUBCUTANEOUS
  Administered 2017-06-04: 6 [IU] via SUBCUTANEOUS

## 2017-06-03 MED ORDER — MIDAZOLAM HCL 2 MG/2ML IJ SOLN
4.0000 mg | Freq: Once | INTRAMUSCULAR | Status: AC
  Administered 2017-06-03: 4 mg via INTRAVENOUS

## 2017-06-03 MED ORDER — FENTANYL CITRATE (PF) 100 MCG/2ML IJ SOLN
50.0000 ug | Freq: Once | INTRAMUSCULAR | Status: AC
  Administered 2017-06-03: 50 ug via INTRAVENOUS
  Filled 2017-06-03: qty 2

## 2017-06-03 MED ORDER — LACTATED RINGERS IV BOLUS (SEPSIS)
500.0000 mL | Freq: Once | INTRAVENOUS | Status: AC
  Administered 2017-06-03: 500 mL via INTRAVENOUS

## 2017-06-03 MED ORDER — FENTANYL CITRATE (PF) 100 MCG/2ML IJ SOLN
50.0000 ug | Freq: Once | INTRAMUSCULAR | Status: DC
Start: 2017-06-03 — End: 2017-06-03

## 2017-06-03 MED ORDER — ETOMIDATE 2 MG/ML IV SOLN
INTRAVENOUS | Status: AC | PRN
  Administered 2017-06-03: 25 mg via INTRAVENOUS

## 2017-06-03 MED ORDER — MIDAZOLAM HCL 2 MG/2ML IJ SOLN
2.0000 mg | INTRAMUSCULAR | Status: DC | PRN
  Administered 2017-06-03: 2 mg via INTRAVENOUS
  Filled 2017-06-03: qty 2

## 2017-06-03 MED ORDER — FENTANYL BOLUS VIA INFUSION
50.0000 ug | INTRAVENOUS | Status: DC | PRN
  Administered 2017-06-03 (×3): 50 ug via INTRAVENOUS
  Filled 2017-06-03: qty 50

## 2017-06-03 MED ORDER — FENTANYL CITRATE (PF) 100 MCG/2ML IJ SOLN
INTRAMUSCULAR | Status: AC | PRN
  Administered 2017-06-03: 50 ug via INTRAVENOUS

## 2017-06-03 MED ORDER — CHLORHEXIDINE GLUCONATE 0.12% ORAL RINSE (MEDLINE KIT)
15.0000 mL | Freq: Two times a day (BID) | OROMUCOSAL | Status: DC
  Administered 2017-06-03 – 2017-06-07 (×9): 15 mL via OROMUCOSAL

## 2017-06-03 MED ORDER — NOREPINEPHRINE BITARTRATE 1 MG/ML IV SOLN
0.0000 ug/min | INTRAVENOUS | Status: DC
  Administered 2017-06-03: 12 ug/min via INTRAVENOUS
  Administered 2017-06-03: 15 ug/min via INTRAVENOUS
  Administered 2017-06-03: 20 ug/min via INTRAVENOUS
  Administered 2017-06-03: 14 ug/min via INTRAVENOUS
  Filled 2017-06-03 (×5): qty 4

## 2017-06-03 MED ORDER — HEPARIN SODIUM (PORCINE) 5000 UNIT/ML IJ SOLN
5000.0000 [IU] | Freq: Three times a day (TID) | INTRAMUSCULAR | Status: DC
  Administered 2017-06-03 – 2017-06-12 (×29): 5000 [IU] via SUBCUTANEOUS
  Filled 2017-06-03 (×28): qty 1

## 2017-06-03 MED ORDER — ONDANSETRON HCL 4 MG/2ML IJ SOLN
4.0000 mg | Freq: Once | INTRAMUSCULAR | Status: AC
  Administered 2017-06-03: 4 mg via INTRAVENOUS
  Filled 2017-06-03: qty 2

## 2017-06-03 MED ORDER — ACETAMINOPHEN 160 MG/5ML PO SOLN
500.0000 mg | ORAL | Status: DC | PRN
Start: 2017-06-03 — End: 2017-06-08
  Administered 2017-06-03 – 2017-06-06 (×6): 500 mg
  Filled 2017-06-03 (×7): qty 20.3

## 2017-06-03 MED ORDER — MIDAZOLAM HCL 2 MG/2ML IJ SOLN
2.0000 mg | INTRAMUSCULAR | Status: DC | PRN
  Administered 2017-06-04 – 2017-06-06 (×3): 2 mg via INTRAVENOUS
  Filled 2017-06-03 (×5): qty 2

## 2017-06-03 MED ORDER — IOPAMIDOL (ISOVUE-300) INJECTION 61%
INTRAVENOUS | Status: AC
  Filled 2017-06-03: qty 100

## 2017-06-03 MED ORDER — VANCOMYCIN HCL 10 G IV SOLR
1250.0000 mg | INTRAVENOUS | Status: DC
  Administered 2017-06-04 – 2017-06-07 (×4): 1250 mg via INTRAVENOUS
  Filled 2017-06-03 (×4): qty 1250

## 2017-06-03 MED ORDER — PANTOPRAZOLE SODIUM 40 MG IV SOLR
40.0000 mg | Freq: Every day | INTRAVENOUS | Status: DC
  Administered 2017-06-03: 40 mg via INTRAVENOUS
  Filled 2017-06-03: qty 40

## 2017-06-03 MED ORDER — PIPERACILLIN-TAZOBACTAM 3.375 G IVPB
3.3750 g | Freq: Three times a day (TID) | INTRAVENOUS | Status: DC
  Administered 2017-06-03 – 2017-06-10 (×22): 3.375 g via INTRAVENOUS
  Filled 2017-06-03 (×24): qty 50

## 2017-06-03 MED ORDER — FENTANYL CITRATE (PF) 100 MCG/2ML IJ SOLN: 25.0000 ug | Freq: Once | INTRAMUSCULAR | Status: DC

## 2017-06-03 MED ORDER — SUCCINYLCHOLINE CHLORIDE 20 MG/ML IJ SOLN
INTRAMUSCULAR | Status: AC | PRN
  Administered 2017-06-03: 100 mg via INTRAVENOUS

## 2017-06-03 MED ORDER — MAGNESIUM SULFATE 2 GM/50ML IV SOLN
2.0000 g | Freq: Once | INTRAVENOUS | Status: AC
  Administered 2017-06-03: 2 g via INTRAVENOUS
  Filled 2017-06-03: qty 50

## 2017-06-03 MED ORDER — FENTANYL 2500MCG IN NS 250ML (10MCG/ML) PREMIX INFUSION
25.0000 ug/h | INTRAVENOUS | Status: DC
  Administered 2017-06-03: 50 ug/h via INTRAVENOUS
  Administered 2017-06-04: 100 ug/h via INTRAVENOUS
  Administered 2017-06-04: 150 ug/h via INTRAVENOUS
  Administered 2017-06-05: 175 ug/h via INTRAVENOUS
  Administered 2017-06-05: 150 ug/h via INTRAVENOUS
  Administered 2017-06-06: 100 ug/h via INTRAVENOUS
  Administered 2017-06-07: 50 ug/h via INTRAVENOUS
  Filled 2017-06-03 (×7): qty 250

## 2017-06-03 MED ORDER — DEXTROSE 5 % IV SOLN
0.0000 ug/min | Freq: Once | INTRAVENOUS | Status: DC
  Filled 2017-06-03: qty 4

## 2017-06-03 NOTE — Procedures (Signed)
Central Venous Catheter Insertion Procedure Note Matthew Riley [id number] 19-Sep-1975  Procedure: Insertion of Central Venous Catheter Indications: Assessment of intravascular volume, Drug and/or fluid administration and Frequent blood sampling  Procedure Details Consent: Unable to obtain consent because of emergent medical necessity. Time Out: Verified patient identification, verified procedure, site/side was marked, verified correct patient position, special equipment/implants available, medications/allergies/relevent history reviewed, required imaging and test results available.  Performed  Maximum sterile technique was used including antiseptics, cap, gloves, gown, hand hygiene, mask and sheet. Skin prep: Chlorhexidine; local anesthetic administered A antimicrobial bonded/coated triple lumen catheter was placed in the right internal jugular vein using the Seldinger technique.  Evaluation Blood flow good Complications: No apparent complications Patient did tolerate procedure well. Chest X-ray ordered to verify placement.  CXR: pending.  Matthew Riley, AGACNP-BC [facility]  Pgr: [phone]  PCCM Pgr: [phone]

## 2017-06-03 NOTE — ED Notes (Signed)
Label sent to main lab for bmp blood test

## 2017-06-03 NOTE — ED Triage Notes (Signed)
Pt brought to ED by [facility] from home after he had a syncopal episode while in the bathroom. Per EMS bathroom was cover on diarrhea and vomit. VS. BP 99/73, HR 119 unable to measure SPO2 by EMS.

## 2017-06-03 NOTE — H&P (Signed)
PULMONARY / CRITICAL CARE MEDICINE   Name: Brennan Litzinger MRN: [id number] DOB: [birth year]    ADMISSION DATE:  06/03/2017 CONSULTATION DATE:  06/03/2017  REFERRING MD:  Dr. Venora Maples  CHIEF COMPLAINT:  Encephalopathy   HISTORY OF PRESENT ILLNESS:   41 year old male with PMH of DM, HTN, CAD s/p stent placement, GERD, and Tobacco abuse. Admission 04/2017 for DKA. Lives with sister. Using home health services.    Presented to ED on 12/31 after being found down in bathroom unresponsive covered in emesis and diarrhea. Upon arrival to ED patient hypotensive and hypoxic, requiring intubated. LA 5.07. Given Vancomycin/Zoysn and 3L NS.  AST/ALT 324/233, Crt 2.34. PCCM asked to admit.   PAST MEDICAL HISTORY :  He  has a past medical history of Diabetes mellitus without complication ([place]), Hypertension, Renal disorder, and Stroke ([place]).  PAST SURGICAL HISTORY: He  has a past surgical history that includes Coronary angioplasty with stent.  No Known Allergies  Current Facility-Administered Medications on File Prior to Encounter  Medication  . sodium chloride 0.9 % bolus 1,000 mL   Current Outpatient Medications on File Prior to Encounter  Medication Sig  . aspirin EC 81 MG tablet Take 1 tablet (81 mg total) by mouth daily.  . bacitracin 500 UNIT/GM ointment Apply 1 application 2 (two) times daily topically. For 7 days.  . blood glucose meter kit and supplies Dispense based on patient and insurance preference. Use up to four times daily as directed. (FOR ICD-9 250.00, 250.01).  . cephALEXin (KEFLEX) 500 MG capsule Take 1 capsule (500 mg total) 4 (four) times daily by mouth. (Patient not taking: Reported on 04/23/2017)  . cyclobenzaprine (FLEXERIL) 10 MG tablet Take 0.5 tablets (5 mg total) by mouth 3 (three) times daily as needed for muscle spasms.  . furosemide (LASIX) 20 MG tablet Take 1 tablet (20 mg total) by mouth daily.  Marland Kitchen gabapentin (NEURONTIN) 300 MG capsule Take 1 capsule (300 mg total) by  mouth 3 (three) times daily.  . indomethacin (INDOCIN) 50 MG capsule Take 1 capsule (50 mg total) by mouth 3 (three) times daily as needed for moderate pain. Take with a meal.  . insulin aspart (NOVOLOG) 100 UNIT/ML injection Inject 5 Units into the skin 3 (three) times daily with meals.  . insulin aspart protamine- aspart (NOVOLOG MIX 70/30) (70-30) 100 UNIT/ML injection Inject 0.38 mLs (38 Units total) into the skin 2 (two) times daily with a meal.  . Insulin Syringe-Needle U-100 (INSULIN SYRINGE .5CC/31GX5/16") 31G X 5/16" 0.5 ML MISC Use as needed to inject insulin as instructed  . lisinopril (PRINIVIL,ZESTRIL) 40 MG tablet Take 1 tablet (40 mg total) by mouth daily.  . methotrexate (RHEUMATREX) 2.5 MG tablet Take 3 tablets (7.5 mg total) by mouth once a week. Caution:Chemotherapy. Protect from light.  . mupirocin ointment (BACTROBAN) 2 % Apply 1 application topically 2 (two) times daily. For 7 days.  . ondansetron (ZOFRAN) 4 MG tablet Take 1 tablet (4 mg total) by mouth every 8 (eight) hours as needed for nausea.  . pantoprazole (PROTONIX) 40 MG tablet Take 1 tablet (40 mg total) by mouth daily.  Marland Kitchen sulfamethoxazole-trimethoprim (BACTRIM DS,SEPTRA DS) 800-160 MG tablet Take 1 tablet by mouth 2 (two) times daily.  . traMADol (ULTRAM) 50 MG tablet Take 1 tablet (50 mg total) by mouth every 8 (eight) hours as needed for severe pain.    FAMILY HISTORY:  His has no family status information on file.    SOCIAL HISTORY: He  reports that he has quit smoking. he has never used smokeless tobacco. He reports that he drinks alcohol. He reports that he does not use drugs.  REVIEW OF SYSTEMS:   Unable to review as patient is intubated and sedated   SUBJECTIVE:   VITAL SIGNS: BP 128/82   Pulse (!) 122   Temp (!) 95.5 F (35.3 C) (Rectal)   Resp (!) 26   Ht '6\' 1"'  (1.854 m)   Wt 93.4 kg (206 lb)   SpO2 100%   BMI 27.18 kg/m   HEMODYNAMICS:    VENTILATOR SETTINGS: Vent Mode: PRVC FiO2  (%):  [100 %] 100 % Set Rate:  [18 bmp] 18 bmp Vt Set:  [640 mL] 640 mL PEEP:  [8 cmH20] 8 cmH20 Plateau Pressure:  [28 cmH20] 28 cmH20  INTAKE / OUTPUT: No intake/output data recorded.  PHYSICAL EXAMINATION: General:  Adult male, on vent  Neuro:  Sedated, moves extremities, pupils intact, +gag/cough, does not follow commands  HEENT:  Dry MM Cardiovascular:  Tachy, no MRG  Lungs:  Rhonchi, diminished to bases. No wheeze  Abdomen:  Non-distended active bowel sounds  Musculoskeletal:  +2 BLE Skin:  Dry, intact   LABS:  BMET Recent Labs  Lab 06/03/17 0127 06/03/17 0159  NA 129* 135  K 4.5 5.3*  CL 102 109  CO2 14*  --   BUN 34* 50*  CREATININE 2.34* 2.40*  GLUCOSE 234* 239*    Electrolytes Recent Labs  Lab 06/03/17 0127  CALCIUM 8.5*    CBC Recent Labs  Lab 06/03/17 0127 06/03/17 0159  WBC 8.9  --   HGB 14.2 16.3  HCT 41.4 48.0  PLT 192  --     Coag's No results for input(s): APTT, INR in the last 168 hours.  Sepsis Markers Recent Labs  Lab 06/03/17 0139  LATICACIDVEN 5.07*    ABG Recent Labs  Lab 06/03/17 0237 06/03/17 0343  PHART 7.173* 7.165*  PCO2ART 49.4* 47.4  PO2ART 46.0* 90.0    Liver Enzymes Recent Labs  Lab 06/03/17 0127  AST 324*  ALT 233*  ALKPHOS 154*  BILITOT 0.8  ALBUMIN 3.2*    Cardiac Enzymes No results for input(s): TROPONINI, PROBNP in the last 168 hours.  Glucose Recent Labs  Lab 06/03/17 0127  GLUCAP 218*    Imaging Ct Abdomen Pelvis Wo Contrast  Result Date: 06/03/2017 CLINICAL DATA:  Acute onset of shortness of breath, generalized abdominal pain, nausea and vomiting. EXAM: CT CHEST, ABDOMEN AND PELVIS WITHOUT CONTRAST TECHNIQUE: Multidetector CT imaging of the chest, abdomen and pelvis was performed following the standard protocol without IV contrast. COMPARISON:  CT of the abdomen and pelvis from 03/08/2017, and chest radiograph performed earlier today at 1:45 a.m. FINDINGS: CT CHEST FINDINGS  Cardiovascular: The heart is normal in size, but difficult to assess without contrast. The thoracic aorta is unremarkable. The great vessels are within normal limits. Mediastinum/Nodes: The mediastinum is grossly unremarkable in appearance. No mediastinal lymphadenopathy is seen, though evaluation for lymphadenopathy is limited without contrast. No pericardial effusion is identified. The visualized portions of the thyroid gland are unremarkable. No axillary lymphadenopathy is seen. The patient's endotracheal tube is seen ending 1-2 cm above the carina. The patient's enteric tube is seen ending at the body of the stomach. Lungs/Pleura: Dense central bilateral airspace opacification is noted, with air bronchograms, compatible with diffuse bilateral pneumonia. No pleural effusion or pneumothorax is seen. Musculoskeletal: No acute osseous abnormalities are identified. The visualized musculature is unremarkable in  appearance. CT ABDOMEN PELVIS FINDINGS Hepatobiliary: The liver is unremarkable in appearance. The gallbladder is unremarkable in appearance. The common bile duct remains normal in caliber. Pancreas: The pancreas is within normal limits. Spleen: The spleen is unremarkable in appearance. Adrenals/Urinary Tract: The adrenal glands are unremarkable in appearance. The kidneys are within normal limits. There is no evidence of hydronephrosis. No renal or ureteral stones are identified. No perinephric stranding is seen. Stomach/Bowel: The stomach is unremarkable in appearance. The small bowel is within normal limits. The appendix is normal in caliber, without evidence of appendicitis. The colon is unremarkable in appearance. Vascular/Lymphatic: Minimal calcification is seen along the abdominal aorta. The abdominal aorta is otherwise grossly unremarkable. The inferior vena cava is grossly unremarkable. Evaluation for retroperitoneal lymphadenopathy is markedly suboptimal without contrast. No definite pelvic sidewall  lymphadenopathy is seen. Reproductive: The bladder is significantly distended and grossly unremarkable. The prostate remains normal in size. Other: No additional soft tissue abnormalities are seen. Musculoskeletal: No acute osseous abnormalities are identified. The visualized musculature is unremarkable in appearance. IMPRESSION: 1. Dense bilateral pneumonia noted, worsened from recent prior studies. Per clinical correlation, this likely reflects aspiration. 2. Endotracheal tube seen ending 1-2 cm above the carina. This could be retracted 2 cm. 3. No acute abnormality seen within the abdomen or pelvis, though evaluation is limited without contrast. These results were called by telephone at the time of interpretation on 06/03/2017 at 4:27 am to Dr. Jola Schmidt, who verbally acknowledged these results. Electronically Signed   By: Garald Balding M.D.   On: 06/03/2017 04:27   Ct Head Wo Contrast  Result Date: 06/03/2017 CLINICAL DATA:  Loss of consciousness following a syncopal episode. History of stroke, hypertension, diabetes. EXAM: CT HEAD WITHOUT CONTRAST TECHNIQUE: Contiguous axial images were obtained from the base of the skull through the vertex without intravenous contrast. COMPARISON:  None. FINDINGS: Brain: No evidence of acute infarction, hemorrhage, hydrocephalus, extra-axial collection or mass lesion/mass effect. Vascular: No hyperdense vessel or unexpected calcification. Skull: Normal. Negative for fracture or focal lesion. Sinuses/Orbits: Mucosal thickening and small retention cysts in the right maxillary antrum. No acute air-fluid levels. Mastoid air cells are not opacified. Other: None. IMPRESSION: No acute intracranial abnormalities. Electronically Signed   By: Lucienne Capers M.D.   On: 06/03/2017 03:53   Ct Chest Wo Contrast  Result Date: 06/03/2017 CLINICAL DATA:  Acute onset of shortness of breath, generalized abdominal pain, nausea and vomiting. EXAM: CT CHEST, ABDOMEN AND PELVIS  WITHOUT CONTRAST TECHNIQUE: Multidetector CT imaging of the chest, abdomen and pelvis was performed following the standard protocol without IV contrast. COMPARISON:  CT of the abdomen and pelvis from 03/08/2017, and chest radiograph performed earlier today at 1:45 a.m. FINDINGS: CT CHEST FINDINGS Cardiovascular: The heart is normal in size, but difficult to assess without contrast. The thoracic aorta is unremarkable. The great vessels are within normal limits. Mediastinum/Nodes: The mediastinum is grossly unremarkable in appearance. No mediastinal lymphadenopathy is seen, though evaluation for lymphadenopathy is limited without contrast. No pericardial effusion is identified. The visualized portions of the thyroid gland are unremarkable. No axillary lymphadenopathy is seen. The patient's endotracheal tube is seen ending 1-2 cm above the carina. The patient's enteric tube is seen ending at the body of the stomach. Lungs/Pleura: Dense central bilateral airspace opacification is noted, with air bronchograms, compatible with diffuse bilateral pneumonia. No pleural effusion or pneumothorax is seen. Musculoskeletal: No acute osseous abnormalities are identified. The visualized musculature is unremarkable in appearance. CT ABDOMEN PELVIS FINDINGS  Hepatobiliary: The liver is unremarkable in appearance. The gallbladder is unremarkable in appearance. The common bile duct remains normal in caliber. Pancreas: The pancreas is within normal limits. Spleen: The spleen is unremarkable in appearance. Adrenals/Urinary Tract: The adrenal glands are unremarkable in appearance. The kidneys are within normal limits. There is no evidence of hydronephrosis. No renal or ureteral stones are identified. No perinephric stranding is seen. Stomach/Bowel: The stomach is unremarkable in appearance. The small bowel is within normal limits. The appendix is normal in caliber, without evidence of appendicitis. The colon is unremarkable in appearance.  Vascular/Lymphatic: Minimal calcification is seen along the abdominal aorta. The abdominal aorta is otherwise grossly unremarkable. The inferior vena cava is grossly unremarkable. Evaluation for retroperitoneal lymphadenopathy is markedly suboptimal without contrast. No definite pelvic sidewall lymphadenopathy is seen. Reproductive: The bladder is significantly distended and grossly unremarkable. The prostate remains normal in size. Other: No additional soft tissue abnormalities are seen. Musculoskeletal: No acute osseous abnormalities are identified. The visualized musculature is unremarkable in appearance. IMPRESSION: 1. Dense bilateral pneumonia noted, worsened from recent prior studies. Per clinical correlation, this likely reflects aspiration. 2. Endotracheal tube seen ending 1-2 cm above the carina. This could be retracted 2 cm. 3. No acute abnormality seen within the abdomen or pelvis, though evaluation is limited without contrast. These results were called by telephone at the time of interpretation on 06/03/2017 at 4:27 am to Dr. Jola Schmidt, who verbally acknowledged these results. Electronically Signed   By: Garald Balding M.D.   On: 06/03/2017 04:27   Dg Chest Portable 1 View  Result Date: 06/03/2017 CLINICAL DATA:  Post intubation. History of diabetes, hypertension, stroke EXAM: PORTABLE CHEST 1 VIEW COMPARISON:  06/03/2017 FINDINGS: Interval placement of an endotracheal tube with tip measuring 3.3 cm above the carina. An enteric tube is been placed. Tip is off the field of view but below the left hemidiaphragm. Shallow inspiration. Heart size and pulmonary vascularity are normal. There is perihilar airspace disease progressing since previous study. This may represent multifocal pneumonia or less likely edema. No pneumothorax. No blunting of costophrenic angles. IMPRESSION: Appliances appear in satisfactory position. Increasing bilateral perihilar airspace disease since previous study.  Electronically Signed   By: Lucienne Capers M.D.   On: 06/03/2017 02:09   Dg Chest Portable 1 View  Result Date: 06/03/2017 CLINICAL DATA:  Acute onset of shortness of breath and vomiting. Tachycardia. EXAM: PORTABLE CHEST 1 VIEW COMPARISON:  Chest radiograph performed 03/22/2017 FINDINGS: The lungs are well-aerated. Bilateral airspace opacities may reflect pneumonia or possibly pulmonary edema. There is no evidence of pleural effusion or pneumothorax. The cardiomediastinal silhouette is within normal limits. No acute osseous abnormalities are seen. IMPRESSION: Bilateral airspace opacities may reflect pneumonia or possibly pulmonary edema. Electronically Signed   By: Garald Balding M.D.   On: 06/03/2017 01:31     STUDIES:  CXR 12/31 > The lungs are well-aerated. Bilateral airspace opacities may reflect pneumonia or possibly pulmonary edema. There is no evidence of pleural effusion or pneumothorax. The cardiomediastinal silhouette is within normal limits. No acute osseous abnormalities are seen. CT Head 12/31 > No acute intracranial abnormalities CT C/A/P 12/31 > 1. Dense bilateral pneumonia noted, worsened from recent prior studies. Per clinical correlation, this likely reflects aspiration. 2. Endotracheal tube seen ending 1-2 cm above the carina. This could be retracted 2 cm. 3. No acute abnormality seen within the abdomen or pelvis, though evaluation is limited without contrast.  CULTURES: Blood 12/31 >> Sputum 12/31 >> U/A  12/31 > Negative   ANTIBIOTICS: Vancomycin 12/31 >> Zosyn 12/31 >>  SIGNIFICANT EVENTS: 12/31 > Presents to ED   LINES/TUBES: ETT 12/31 >>   DISCUSSION: 41 year old male presents after being found lying in bathroom floor. Upon arrival patient was minimally responsive with hypotension and hypoxia. Intubated. CT head negative. CT chest with bilateral infiltrates.    ASSESSMENT / PLAN:  PULMONARY A: Acute Hypoxic Respiratory Failure in setting of Aspiration  PNA  ABG 7.165/47.4/90 P:   Vent Support  Trend ABG/CXR VAP bundle   CARDIOVASCULAR A:  Septic vs hypovolemic shock  H/O CAD s/p stent, HTN  P:  Cardiac Monitoring  Wean Levophed to maintain MAP >65  Trend CVP  ECHO pending  Hold lasix and Lisinopril in setting of hypotension   RENAL A:   Non Gap Metabolic Acidosis in setting of lactic acidosis  Acute Kidney Injury s/p 3L NS  LA 5.07 >  P:   Trend BMP Replace electrolytes as indicated  Trend LA  NS @ 125 ml/hr   GASTROINTESTINAL A:   SUP  N/V/D  Transaminase AST 324, ALT 233  P:   Trend LFT  NPO PPI  Amylase/Lipase pending  GI Pathogen panel pending  O & P pending  Acute Hepatitis pending   HEMATOLOGIC A:   No issues  P:  Trend CBC  SCDs  Heparin SQ   INFECTIOUS A:   Aspiration PNA  -CT with dense bilateral PNA after aspiration event  -U/A Negative  Ulcer to Left Lower Extremity > Referred outpatient to wound are center P:   Trend WBC and Fever Curve Trend PCT and LA  Follow Culture Data  Flu and RVP pending  Continue vancomycin and Zosyn   ENDOCRINE A:   Type 1 DM   P:   Trend Glucose  SSI   NEUROLOGIC A:   Metabolic Encephalopathy  H/O CVA, Chronic Pain   -CT Head Negative  P:   RASS goal: 0/-1 PRN Versed/Fentanyl to achieve RASS ETOH and UDS pending   FAMILY  - Updates: no family at bedside.   - Inter-disciplinary family meet or Palliative Care meeting due by: 06/10/2017   CC Time: 58 minutes   Hayden Pedro, AGACNP-BC [facility]  Pgr: [phone]  PCCM Pgr: [phone]

## 2017-06-03 NOTE — Progress Notes (Signed)
RT NOTE:  ABG redraw. Results reported to Dr. Patria Mane. Vent RR increased to 22, FiO2 dropped to 90%.

## 2017-06-03 NOTE — Progress Notes (Signed)
PHARMACY - PHYSICIAN COMMUNICATION CRITICAL VALUE ALERT - BLOOD CULTURE IDENTIFICATION (BCID)  Results for orders placed or performed during the hospital encounter of 06/03/17  Blood Culture ID Panel (Reflexed) (Collected: 06/03/2017  4:48 AM)  Result Value Ref Range   Enterococcus species NOT DETECTED NOT DETECTED   Listeria monocytogenes NOT DETECTED NOT DETECTED   Staphylococcus species NOT DETECTED NOT DETECTED   Staphylococcus aureus NOT DETECTED NOT DETECTED   Streptococcus species DETECTED (A) NOT DETECTED   Streptococcus agalactiae NOT DETECTED NOT DETECTED   Streptococcus pneumoniae NOT DETECTED NOT DETECTED   Streptococcus pyogenes NOT DETECTED NOT DETECTED   Acinetobacter baumannii NOT DETECTED NOT DETECTED   Enterobacteriaceae species NOT DETECTED NOT DETECTED   Enterobacter cloacae complex NOT DETECTED NOT DETECTED   Escherichia coli NOT DETECTED NOT DETECTED   Klebsiella oxytoca NOT DETECTED NOT DETECTED   Klebsiella pneumoniae NOT DETECTED NOT DETECTED   Proteus species NOT DETECTED NOT DETECTED   Serratia marcescens NOT DETECTED NOT DETECTED   Carbapenem resistance NOT DETECTED NOT DETECTED   Haemophilus influenzae NOT DETECTED NOT DETECTED   Neisseria meningitidis NOT DETECTED NOT DETECTED   Pseudomonas aeruginosa NOT DETECTED NOT DETECTED   Candida albicans NOT DETECTED NOT DETECTED   Candida glabrata NOT DETECTED NOT DETECTED   Candida krusei NOT DETECTED NOT DETECTED   Candida parapsilosis NOT DETECTED NOT DETECTED   Candida tropicalis NOT DETECTED NOT DETECTED    Name of physician (or Provider) Contacted: N/a; on appropriate therapy, strep species, not otherwise identified.  Changes to prescribed antibiotics required: None, on vancomycin and zosyn already.  Tad Moore, BCPS  Clinical Pharmacist Pager [phone]  06/03/2017 9:25 PM

## 2017-06-03 NOTE — Progress Notes (Signed)
CRITICAL VALUE ALERT  Critical Value:  Lactic Acid 4.3  Date & Time Notied:  1110 06/03/17  Provider Notified: Dr Wallace Cullens  Orders Received/Actions taken: 500 bolus to be given

## 2017-06-03 NOTE — ED Provider Notes (Signed)
[place] EMERGENCY DEPARTMENT Provider Note   CSN: [id number] Arrival date & time: 06/03/17  0104     History   Chief Complaint Chief Complaint  Patient presents with  . Loss of Consciousness   Level 5 caveat: Altered mental status  HPI Matthew Riley is a 41 y.o. male.  HPI Patient is a 41 year old male with a history of intermittent compliance with his medications for his type 1 diabetes.  He presents to the emergency department after family members found him unresponsive lying naked in the bathroom.  On arrival of EMS they state there was significant vomit and diarrhea all over the bathroom and the patient was minimally responsive.  He had blood pressure in the 80s and was brought to the emergency department.  Blood sugar in the 200s for EMS.  Following simple commands for EMS.   Past Medical History:  Diagnosis Date  . Diabetes mellitus without complication ([place])   . Hypertension   . Renal disorder   . Stroke [facility])     Patient Active Problem List   Diagnosis Date Noted  . Abdominal pain 04/16/2017  . Tobacco abuse 04/16/2017  . Leg wound, right 04/16/2017  . Bilateral leg edema 04/16/2017  . 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 CAD (coronary artery disease) 04/16/2017  . Hyperglycemia 04/16/2017  . Hypertension   . Uncontrolled type 1 diabetes mellitus with hyperglycemia ([place])   . Neuropathy   . Nausea & vomiting 03/08/2017  . GERD (gastroesophageal reflux disease) 03/08/2017  . Hyponatremia 03/08/2017  . AKI (acute kidney injury) ([place]) 03/08/2017  . Hyperkalemia 03/08/2017  . DKA, type 1 [facility])     Past Surgical History:  Procedure Laterality Date  . CORONARY ANGIOPLASTY WITH STENT PLACEMENT         Home Medications    Prior to Admission medications   Medication Sig Start Date End Date Taking? Authorizing Provider  aspirin EC 81 MG tablet Take 1 tablet (81 mg total) by mouth daily. 05/16/17   Alfonse Spruce, FNP    bacitracin 500 UNIT/GM ointment Apply 1 application 2 (two) times daily topically. For 7 days. 04/09/17   Alfonse Spruce, FNP  blood glucose meter kit and supplies Dispense based on patient and insurance preference. Use up to four times daily as directed. (FOR ICD-9 250.00, 250.01). 04/17/17   Caren Griffins, MD  cephALEXin (KEFLEX) 500 MG capsule Take 1 capsule (500 mg total) 4 (four) times daily by mouth. Patient not taking: Reported on 04/23/2017 04/09/17   Alfonse Spruce, FNP  cyclobenzaprine (FLEXERIL) 10 MG tablet Take 0.5 tablets (5 mg total) by mouth 3 (three) times daily as needed for muscle spasms. 05/16/17   Alfonse Spruce, FNP  furosemide (LASIX) 20 MG tablet Take 1 tablet (20 mg total) by mouth daily. 05/16/17   Alfonse Spruce, FNP  gabapentin (NEURONTIN) 300 MG capsule Take 1 capsule (300 mg total) by mouth 3 (three) times daily. 05/16/17   Alfonse Spruce, FNP  indomethacin (INDOCIN) 50 MG capsule Take 1 capsule (50 mg total) by mouth 3 (three) times daily as needed for moderate pain. Take with a meal. 05/16/17   Hairston, Toy Baker R, FNP  insulin aspart (NOVOLOG) 100 UNIT/ML injection Inject 5 Units into the skin 3 (three) times daily with meals. 05/16/17   Alfonse Spruce, FNP  insulin aspart protamine- aspart (NOVOLOG MIX 70/30) (70-30) 100 UNIT/ML injection Inject 0.38 mLs (38 Units total) into the skin 2 (two) times  daily with a meal. 05/15/17   Hairston, Toy Baker R, FNP  Insulin Syringe-Needle U-100 (INSULIN SYRINGE .5CC/31GX5/16") 31G X 5/16" 0.5 ML MISC Use as needed to inject insulin as instructed 04/17/17   Caren Griffins, MD  lisinopril (PRINIVIL,ZESTRIL) 40 MG tablet Take 1 tablet (40 mg total) by mouth daily. 05/16/17   Alfonse Spruce, FNP  methotrexate (RHEUMATREX) 2.5 MG tablet Take 3 tablets (7.5 mg total) by mouth once a week. Caution:Chemotherapy. Protect from light. 05/16/17   Alfonse Spruce, FNP  mupirocin ointment  (BACTROBAN) 2 % Apply 1 application topically 2 (two) times daily. For 7 days. 05/17/17   Alfonse Spruce, FNP  ondansetron (ZOFRAN) 4 MG tablet Take 1 tablet (4 mg total) by mouth every 8 (eight) hours as needed for nausea. 05/16/17   Alfonse Spruce, FNP  pantoprazole (PROTONIX) 40 MG tablet Take 1 tablet (40 mg total) by mouth daily. 05/16/17   Alfonse Spruce, FNP  sulfamethoxazole-trimethoprim (BACTRIM DS,SEPTRA DS) 800-160 MG tablet Take 1 tablet by mouth 2 (two) times daily. 05/16/17   Alfonse Spruce, FNP  traMADol (ULTRAM) 50 MG tablet Take 1 tablet (50 mg total) by mouth every 8 (eight) hours as needed for severe pain. 05/15/17   Alfonse Spruce, FNP    Family History Family History  Family history unknown: Yes    Social History Social History   Tobacco Use  . Smoking status: Former Research scientist (life sciences)  . Smokeless tobacco: Never Used  Substance Use Topics  . Alcohol use: Yes  . Drug use: No     Allergies   Patient has no known allergies.   Review of Systems Review of Systems  Unable to perform ROS: Mental status change     Physical Exam Updated Vital Signs BP 114/69   Pulse (!) 110   Temp (!) 95.5 F (35.3 C) (Rectal)   Resp (!) 24   Ht _0  (1.854 m)   Wt 93.4 kg (206 lb)   SpO2 93%   BMI 27.18 kg/m   Physical Exam  Constitutional:  Ill-appearing  HENT:  Head: Normocephalic and atraumatic.  Eyes: EOM are normal.  Neck: Normal range of motion.  Cardiovascular: Intact distal pulses.  Tachycardia  Pulmonary/Chest: Effort normal and breath sounds normal. No respiratory distress.  Abdominal: He exhibits no distension.  Mild generalized abdominal tenderness  Musculoskeletal: He exhibits edema. He exhibits no tenderness.  Neurological: He is alert.  Follow simple commands and answer simple questions  Skin: Skin is warm and dry.  Psychiatric:  Unable to test  Nursing note and vitals reviewed.    ED Treatments / Results  Labs (all  labs ordered are listed, but only abnormal results are displayed) Labs Reviewed  COMPREHENSIVE METABOLIC PANEL - Abnormal; Notable for the following components:      Result Value   Sodium 129 (*)    CO2 14 (*)    Glucose, Bld 234 (*)    BUN 34 (*)    Creatinine, Ser 2.34 (*)    Calcium 8.5 (*)    Total Protein 9.4 (*)    Albumin 3.2 (*)    AST 324 (*)    ALT 233 (*)    Alkaline Phosphatase 154 (*)    GFR calc non Af Amer 33 (*)    GFR calc Af Amer 38 (*)    All other components within normal limits  CBG MONITORING, ED - Abnormal; Notable for the following components:   Glucose-Capillary 218 (*)  All other components within normal limits  I-STAT CG4 LACTIC ACID, ED - Abnormal; Notable for the following components:   Lactic Acid, Venous 5.07 (*)    All other components within normal limits  I-STAT ARTERIAL BLOOD GAS, ED - Abnormal; Notable for the following components:   pH, Arterial 7.173 (*)    pCO2 arterial 49.4 (*)    pO2, Arterial 46.0 (*)    Bicarbonate 18.5 (*)    TCO2 20 (*)    Acid-base deficit 10.0 (*)    All other components within normal limits  I-STAT CHEM 8, ED - Abnormal; Notable for the following components:   Potassium 5.3 (*)    BUN 50 (*)    Creatinine, Ser 2.40 (*)    Glucose, Bld 239 (*)    Calcium, Ion 0.98 (*)    TCO2 17 (*)    All other components within normal limits  C DIFFICILE QUICK SCREEN W PCR REFLEX  CULTURE, BLOOD (ROUTINE X 2)  CULTURE, BLOOD (ROUTINE X 2)  URINE CULTURE  CBC  LIPASE, BLOOD  LACTIC ACID, PLASMA  URINALYSIS, ROUTINE W REFLEX MICROSCOPIC  HEPATITIS PANEL, ACUTE  I-STAT TROPONIN, ED   BUN  Date Value Ref Range Status  06/03/2017 50 (H) 6 - 20 mg/dL Final  06/03/2017 34 (H) 6 - 20 mg/dL Final  04/17/2017 13 6 - 20 mg/dL Final  04/16/2017 14 6 - 20 mg/dL Final  03/22/2017 12 6 - 24 mg/dL Final   Creatinine, Ser  Date Value Ref Range Status  06/03/2017 2.40 (H) 0.61 - 1.24 mg/dL Final  06/03/2017 2.34 (H) 0.61  - 1.24 mg/dL Final  04/17/2017 1.00 0.61 - 1.24 mg/dL Final  04/16/2017 1.00 0.61 - 1.24 mg/dL Final     EKG  EKG Interpretation None       Radiology Ct Abdomen Pelvis Wo Contrast  Result Date: 06/03/2017 CLINICAL DATA:  Acute onset of shortness of breath, generalized abdominal pain, nausea and vomiting. EXAM: CT CHEST, ABDOMEN AND PELVIS WITHOUT CONTRAST TECHNIQUE: Multidetector CT imaging of the chest, abdomen and pelvis was performed following the standard protocol without IV contrast. COMPARISON:  CT of the abdomen and pelvis from 03/08/2017, and chest radiograph performed earlier today at 1:45 a.m. FINDINGS: CT CHEST FINDINGS Cardiovascular: The heart is normal in size, but difficult to assess without contrast. The thoracic aorta is unremarkable. The great vessels are within normal limits. Mediastinum/Nodes: The mediastinum is grossly unremarkable in appearance. No mediastinal lymphadenopathy is seen, though evaluation for lymphadenopathy is limited without contrast. No pericardial effusion is identified. The visualized portions of the thyroid gland are unremarkable. No axillary lymphadenopathy is seen. The patient's endotracheal tube is seen ending 1-2 cm above the carina. The patient's enteric tube is seen ending at the body of the stomach. Lungs/Pleura: Dense central bilateral airspace opacification is noted, with air bronchograms, compatible with diffuse bilateral pneumonia. No pleural effusion or pneumothorax is seen. Musculoskeletal: No acute osseous abnormalities are identified. The visualized musculature is unremarkable in appearance. CT ABDOMEN PELVIS FINDINGS Hepatobiliary: The liver is unremarkable in appearance. The gallbladder is unremarkable in appearance. The common bile duct remains normal in caliber. Pancreas: The pancreas is within normal limits. Spleen: The spleen is unremarkable in appearance. Adrenals/Urinary Tract: The adrenal glands are unremarkable in appearance. The  kidneys are within normal limits. There is no evidence of hydronephrosis. No renal or ureteral stones are identified. No perinephric stranding is seen. Stomach/Bowel: The stomach is unremarkable in appearance. The small bowel is within normal  limits. The appendix is normal in caliber, without evidence of appendicitis. The colon is unremarkable in appearance. Vascular/Lymphatic: Minimal calcification is seen along the abdominal aorta. The abdominal aorta is otherwise grossly unremarkable. The inferior vena cava is grossly unremarkable. Evaluation for retroperitoneal lymphadenopathy is markedly suboptimal without contrast. No definite pelvic sidewall lymphadenopathy is seen. Reproductive: The bladder is significantly distended and grossly unremarkable. The prostate remains normal in size. Other: No additional soft tissue abnormalities are seen. Musculoskeletal: No acute osseous abnormalities are identified. The visualized musculature is unremarkable in appearance. IMPRESSION: 1. Dense bilateral pneumonia noted, worsened from recent prior studies. Per clinical correlation, this likely reflects aspiration. 2. Endotracheal tube seen ending 1-2 cm above the carina. This could be retracted 2 cm. 3. No acute abnormality seen within the abdomen or pelvis, though evaluation is limited without contrast. These results were called by telephone at the time of interpretation on 06/03/2017 at 4:27 am to Dr. Jola Schmidt, who verbally acknowledged these results. Electronically Signed   By: Garald Balding M.D.   On: 06/03/2017 04:27   Ct Head Wo Contrast  Result Date: 06/03/2017 CLINICAL DATA:  Loss of consciousness following a syncopal episode. History of stroke, hypertension, diabetes. EXAM: CT HEAD WITHOUT CONTRAST TECHNIQUE: Contiguous axial images were obtained from the base of the skull through the vertex without intravenous contrast. COMPARISON:  None. FINDINGS: Brain: No evidence of acute infarction, hemorrhage,  hydrocephalus, extra-axial collection or mass lesion/mass effect. Vascular: No hyperdense vessel or unexpected calcification. Skull: Normal. Negative for fracture or focal lesion. Sinuses/Orbits: Mucosal thickening and small retention cysts in the right maxillary antrum. No acute air-fluid levels. Mastoid air cells are not opacified. Other: None. IMPRESSION: No acute intracranial abnormalities. Electronically Signed   By: Lucienne Capers M.D.   On: 06/03/2017 03:53   Ct Chest Wo Contrast  Result Date: 06/03/2017 CLINICAL DATA:  Acute onset of shortness of breath, generalized abdominal pain, nausea and vomiting. EXAM: CT CHEST, ABDOMEN AND PELVIS WITHOUT CONTRAST TECHNIQUE: Multidetector CT imaging of the chest, abdomen and pelvis was performed following the standard protocol without IV contrast. COMPARISON:  CT of the abdomen and pelvis from 03/08/2017, and chest radiograph performed earlier today at 1:45 a.m. FINDINGS: CT CHEST FINDINGS Cardiovascular: The heart is normal in size, but difficult to assess without contrast. The thoracic aorta is unremarkable. The great vessels are within normal limits. Mediastinum/Nodes: The mediastinum is grossly unremarkable in appearance. No mediastinal lymphadenopathy is seen, though evaluation for lymphadenopathy is limited without contrast. No pericardial effusion is identified. The visualized portions of the thyroid gland are unremarkable. No axillary lymphadenopathy is seen. The patient's endotracheal tube is seen ending 1-2 cm above the carina. The patient's enteric tube is seen ending at the body of the stomach. Lungs/Pleura: Dense central bilateral airspace opacification is noted, with air bronchograms, compatible with diffuse bilateral pneumonia. No pleural effusion or pneumothorax is seen. Musculoskeletal: No acute osseous abnormalities are identified. The visualized musculature is unremarkable in appearance. CT ABDOMEN PELVIS FINDINGS Hepatobiliary: The liver is  unremarkable in appearance. The gallbladder is unremarkable in appearance. The common bile duct remains normal in caliber. Pancreas: The pancreas is within normal limits. Spleen: The spleen is unremarkable in appearance. Adrenals/Urinary Tract: The adrenal glands are unremarkable in appearance. The kidneys are within normal limits. There is no evidence of hydronephrosis. No renal or ureteral stones are identified. No perinephric stranding is seen. Stomach/Bowel: The stomach is unremarkable in appearance. The small bowel is within normal limits. The appendix is normal  in caliber, without evidence of appendicitis. The colon is unremarkable in appearance. Vascular/Lymphatic: Minimal calcification is seen along the abdominal aorta. The abdominal aorta is otherwise grossly unremarkable. The inferior vena cava is grossly unremarkable. Evaluation for retroperitoneal lymphadenopathy is markedly suboptimal without contrast. No definite pelvic sidewall lymphadenopathy is seen. Reproductive: The bladder is significantly distended and grossly unremarkable. The prostate remains normal in size. Other: No additional soft tissue abnormalities are seen. Musculoskeletal: No acute osseous abnormalities are identified. The visualized musculature is unremarkable in appearance. IMPRESSION: 1. Dense bilateral pneumonia noted, worsened from recent prior studies. Per clinical correlation, this likely reflects aspiration. 2. Endotracheal tube seen ending 1-2 cm above the carina. This could be retracted 2 cm. 3. No acute abnormality seen within the abdomen or pelvis, though evaluation is limited without contrast. These results were called by telephone at the time of interpretation on 06/03/2017 at 4:27 am to Dr. Jola Schmidt, who verbally acknowledged these results. Electronically Signed   By: Garald Balding M.D.   On: 06/03/2017 04:27   Dg Chest Portable 1 View  Result Date: 06/03/2017 CLINICAL DATA:  Post intubation. History of  diabetes, hypertension, stroke EXAM: PORTABLE CHEST 1 VIEW COMPARISON:  06/03/2017 FINDINGS: Interval placement of an endotracheal tube with tip measuring 3.3 cm above the carina. An enteric tube is been placed. Tip is off the field of view but below the left hemidiaphragm. Shallow inspiration. Heart size and pulmonary vascularity are normal. There is perihilar airspace disease progressing since previous study. This may represent multifocal pneumonia or less likely edema. No pneumothorax. No blunting of costophrenic angles. IMPRESSION: Appliances appear in satisfactory position. Increasing bilateral perihilar airspace disease since previous study. Electronically Signed   By: Lucienne Capers M.D.   On: 06/03/2017 02:09   Dg Chest Portable 1 View  Result Date: 06/03/2017 CLINICAL DATA:  Acute onset of shortness of breath and vomiting. Tachycardia. EXAM: PORTABLE CHEST 1 VIEW COMPARISON:  Chest radiograph performed 03/22/2017 FINDINGS: The lungs are well-aerated. Bilateral airspace opacities may reflect pneumonia or possibly pulmonary edema. There is no evidence of pleural effusion or pneumothorax. The cardiomediastinal silhouette is within normal limits. No acute osseous abnormalities are seen. IMPRESSION: Bilateral airspace opacities may reflect pneumonia or possibly pulmonary edema. Electronically Signed   By: Garald Balding M.D.   On: 06/03/2017 01:31    Procedures Performed by: Jola Schmidt, MD      Required items: required blood products, implants, devices, and special equipment available Patient identity confirmed: provided demographic data and hospital-assigned identification number Time out: Immediately prior to procedure a "time out" was called to verify the correct patient, procedure, equipment, support staff and site/side marked as required. Indications: respiratory failure Intubation method: Glidescope Laryngoscopy  Preoxygenation: BVM Sedatives: Etomidate Paralytic:  Succinylcholine Tube Size: 7.5 cuffed Post-procedure assessment: chest rise and ETCO2 monitor Breath sounds: equal and absent over the epigastrium Tube secured with: ETT holder Chest x-ray interpreted by radiologist and me. Chest x-ray findings: endotracheal tube in appropriate position Patient tolerated the procedure well with no immediate complications.   CRITICAL CARE Performed by: Jola Schmidt Total critical care time: 40 minutes Critical care time was exclusive of separately billable procedures and treating other patients. Critical care was necessary to treat or prevent imminent or life-threatening deterioration. Critical care was time spent personally by me on the following activities: development of treatment plan with patient and/or surrogate as well as nursing, discussions with consultants, evaluation of patient's response to treatment, examination of patient, obtaining history from patient  or surrogate, ordering and performing treatments and interventions, ordering and review of laboratory studies, ordering and review of radiographic studies, pulse oximetry and re-evaluation of patient's condition.   Medications Ordered in ED Medications  iopamidol (ISOVUE-300) 61 % injection (  Hold 06/03/17 0145)  fentaNYL (SUBLIMAZE) injection 50 mcg (0 mcg Intravenous Hold 06/03/17 0201)  fentaNYL (SUBLIMAZE) 100 MCG/2ML injection (0 mcg  Hold 06/03/17 0200)  midazolam (VERSED) 2 MG/2ML injection (0 mg  Hold 06/03/17 0200)  fentaNYL (SUBLIMAZE) 100 MCG/2ML injection (0 mcg  Hold 06/03/17 0245)  midazolam (VERSED) 2 MG/2ML injection (0 mg  Hold 06/03/17 0245)  vancomycin (VANCOCIN) 1,500 mg in sodium chloride 0.9 % 500 mL IVPB (1,500 mg Intravenous New Bag/Given 06/03/17 0335)  midazolam (VERSED) 2 MG/2ML injection (not administered)  fentaNYL (SUBLIMAZE) injection 50 mcg (0 mcg Intravenous Hold 06/03/17 0426)  sodium chloride 0.9 % bolus 1,000 mL (0 mLs Intravenous Stopped 06/03/17 0343)   ondansetron (ZOFRAN) injection 4 mg (4 mg Intravenous Given 06/03/17 0140)  etomidate (AMIDATE) injection (25 mg Intravenous Given 06/03/17 0148)  succinylcholine (ANECTINE) injection (100 mg Intravenous Given 06/03/17 0148)  midazolam (VERSED) injection 4 mg (4 mg Intravenous Given 06/03/17 0202)  sodium chloride 0.9 % bolus 2,000 mL (0 mLs Intravenous Stopped 06/03/17 0344)  fentaNYL (SUBLIMAZE) injection (50 mcg Intravenous Given 06/03/17 0159)  midazolam (VERSED) 5 MG/5ML injection (4 mg Intravenous Given 06/03/17 0159)  norepinephrine (LEVOPHED) 4 mg in dextrose 5 % 250 mL (0.016 mg/mL) infusion (10 mcg/min Intravenous Rate/Dose Change 06/03/17 0425)  midazolam (VERSED) injection 4 mg (4 mg Intravenous Given 06/03/17 0245)  fentaNYL (SUBLIMAZE) injection 50 mcg (50 mcg Intravenous Given 06/03/17 0245)  piperacillin-tazobactam (ZOSYN) IVPB 3.375 g (0 g Intravenous Stopped 06/03/17 0407)  fentaNYL (SUBLIMAZE) 100 MCG/2ML injection (100 mcg  Given 06/03/17 0424)  midazolam (VERSED) injection 4 mg (4 mg Intravenous Given 06/03/17 0425)     Initial Impression / Assessment and Plan / ED Course  I have reviewed the triage vital signs and the nursing notes.  Pertinent labs & imaging results that were available during my care of the patient were reviewed by me and considered in my medical decision making (see chart for details).     Progressive hypoxia on arrival to emergency department.  Patient intubated for airway protection and acute respiratory failure.  Profuse nausea vomiting diarrhea noted seen.  Likely significant aspiration.  Cover with IV antibiotics including IV Zosyn.  Lactate elevated at 5.  Hypotension persisted post intubation therefore initiated on levophed with large volume saline resuscitation.  3 L of fluid given.  Acute kidney injury noted with a creatinine greater than 2.  Baseline creatinine for the patient is 1.  No additional family or friends have presented to the  hospital to provide any additional history.  Patient has had multiple ongoing profuse stools while in the emergency department.  Patient's stool was sent for C. difficile.  Nonspecific elevation in his LFTs may represent shock liver.  Acute hepatitis panel added.  Head CT without acute pathology.  Patient will be admitted to the intensive care unit in critical condition.   Final Clinical Impressions(s) / ED Diagnoses   Final diagnoses:  Acute respiratory failure with hypoxia (HCC)  Aspiration pneumonia of both lungs, unspecified aspiration pneumonia type, unspecified part of lung ([place])  Septic shock ([place])  AKI (acute kidney injury) [facility])    ED Discharge Orders    None       Jola Schmidt, MD 06/03/17 [phone]

## 2017-06-03 NOTE — Progress Notes (Addendum)
Pharmacy Antibiotic Note  Matthew Riley is a 41 y.o. male admitted on 06/03/2017 with LOC and suspected sepsis. Pharmacy has been consulted for vancomycin and zosyn dosing.  Temp 95.5, WBC 8.9, and LA 5.07. SCr 2.4 (BL 1) for estimated CrCl ~ 40-45 mL/min.  Patient received vancomycin 1.5g IV x1 and zosyn 3.375g IV x1 in the ED.   Plan: Vancomycin 1250 mg IV q24hr Zosyn 3.375g IV q8hr Vancomycin trough at SS and PRN (goal 15-20 mcg/mL) Monitor for changes in renal function, clinical picture, and culture data F/u length of therapy   Height: 6\' 1"  (185.4 cm) Weight: 206 lb (93.4 kg) IBW/kg (Calculated) : 79.9  Temp (24hrs), Avg:95.5 F (35.3 C), Min:95.5 F (35.3 C), Max:95.5 F (35.3 C)  Recent Labs  Lab 06/03/17 0127 06/03/17 0139 06/03/17 0159  WBC 8.9  --   --   CREATININE 2.34*  --  2.40*  LATICACIDVEN  --  5.07*  --     Estimated Creatinine Clearance: 45.3 mL/min (A) (by C-G formula based on SCr of 2.4 mg/dL (H)).    No Known Allergies  Antimicrobials this admission: 12/31 Vanc >>  12/31 Zosyn >>   1/32, PharmD Clinical Pharmacist 06/03/17 5:12 AM

## 2017-06-03 NOTE — Progress Notes (Signed)
Initial Nutrition Assessment  INTERVENTION:   Recommend: Vital AF 1.2 @ 85 ml/hr (2040 ml/day) Provides: 2448 kcal, 153 grams protein, and 1654 ml free water.   NUTRITION DIAGNOSIS:   Inadequate oral intake related to inability to eat as evidenced by NPO status.  GOAL:   Patient will meet greater than or equal to 90% of their needs  MONITOR:   Vent status, I & O's  REASON FOR ASSESSMENT:   Ventilator    ASSESSMENT:   Pt with PMH of type 1 DM, HTN, CAD s/p stent, GERD, with recent admission 04/2017 for DKA. Pt lives with sister. Admitted with acute hypoxic respiratory failure d/t aspiration PNA and septic shock.    Pt awake and alert on vent support. Pt shakes head yes to weight loss PTA.  Elevated MV and fevers increasing needs  Patient is currently intubated on ventilator support MV: 15.4 L/min Temp (24hrs), Avg:100.9 F (38.3 C), Min:95.5 F (35.3 C), Max:102.2 F (39 C)  Medications reviewed and include: levo Labs reviewed: Na 134 (L), PO4: 5.1 (H) CBG's: 167-132   NUTRITION - FOCUSED PHYSICAL EXAM:    Most Recent Value  Orbital Region  No depletion  Upper Arm Region  Mild depletion  Thoracic and Lumbar Region  No depletion  Buccal Region  No depletion  Temple Region  Mild depletion  Clavicle Bone Region  Mild depletion  Clavicle and Acromion Bone Region  No depletion  Scapular Bone Region  Unable to assess  Dorsal Hand  No depletion  Patellar Region  No depletion  Anterior Thigh Region  No depletion  Posterior Calf Region  No depletion  Edema (RD Assessment)  Moderate  Hair  Reviewed  Eyes  Reviewed  Mouth  Unable to assess  Skin  Reviewed  Nails  Reviewed      Diet Order:  No diet orders on file  EDUCATION NEEDS:   No education needs have been identified at this time  Skin:  Skin Assessment: Reviewed RN Assessment  Last BM:  12/31  Height:   Ht Readings from Last 1 Encounters:  06/03/17 6\' 1"  (1.854 m)    Weight:   Wt Readings  from Last 1 Encounters:  06/03/17 206 lb (93.4 kg)    Ideal Body Weight:  83.6 kg  BMI:  Body mass index is 27.18 kg/m.  Estimated Nutritional Needs:   Kcal:  2476  Protein:  125-145 grams  Fluid:  > 2 L/day  2477 RD, LDN, CNSC [phone] Pager [phone] After Hours Pager

## 2017-06-03 NOTE — Progress Notes (Signed)
RT NOTE:  Pt transported to 4N30 without event. Report given to [facility], RRT.

## 2017-06-03 NOTE — Progress Notes (Signed)
RT NOTE:  ABG ordered. Sample resulted Venous. RT will redraw and report to MD.

## 2017-06-04 ENCOUNTER — Encounter (HOSPITAL_COMMUNITY): Payer: Self-pay

## 2017-06-04 ENCOUNTER — Inpatient Hospital Stay (HOSPITAL_COMMUNITY): Payer: Medicaid Other

## 2017-06-04 LAB — CBC
HEMATOCRIT: 32.5 % — AB (ref 39.0–52.0)
Hemoglobin: 11 g/dL — ABNORMAL LOW (ref 13.0–17.0)
MCH: 29.6 pg (ref 26.0–34.0)
MCHC: 33.8 g/dL (ref 30.0–36.0)
MCV: 87.6 fL (ref 78.0–100.0)
PLATELETS: 211 10*3/uL (ref 150–400)
RBC: 3.71 MIL/uL — ABNORMAL LOW (ref 4.22–5.81)
RDW: 14 % (ref 11.5–15.5)
WBC: 16.3 10*3/uL — AB (ref 4.0–10.5)

## 2017-06-04 LAB — MAGNESIUM: Magnesium: 1.7 mg/dL (ref 1.7–2.4)

## 2017-06-04 LAB — BASIC METABOLIC PANEL
Anion gap: 5 (ref 5–15)
BUN: 30 mg/dL — AB (ref 6–20)
CHLORIDE: 104 mmol/L (ref 101–111)
CO2: 21 mmol/L — AB (ref 22–32)
CREATININE: 1.71 mg/dL — AB (ref 0.61–1.24)
Calcium: 7.7 mg/dL — ABNORMAL LOW (ref 8.9–10.3)
GFR calc Af Amer: 55 mL/min — ABNORMAL LOW (ref >60)
GFR calc non Af Amer: 47 mL/min — ABNORMAL LOW (ref >60)
Glucose, Bld: 200 mg/dL — ABNORMAL HIGH (ref 65–99)
POTASSIUM: 5.6 mmol/L — AB (ref 3.5–5.1)
SODIUM: 130 mmol/L — AB (ref 135–145)

## 2017-06-04 LAB — GLUCOSE, CAPILLARY
GLUCOSE-CAPILLARY: 135 mg/dL — AB (ref 65–99)
GLUCOSE-CAPILLARY: 232 mg/dL — AB (ref 65–99)
GLUCOSE-CAPILLARY: 136 mg/dL — AB (ref 65–99)
Glucose-Capillary: 205 mg/dL — ABNORMAL HIGH (ref 65–99)
Glucose-Capillary: 161 mg/dL — ABNORMAL HIGH (ref 65–99)

## 2017-06-04 LAB — CULTURE, BLOOD (ROUTINE X 2): Special Requests: ADEQUATE

## 2017-06-04 LAB — PHOSPHORUS: Phosphorus: 4.2 mg/dL (ref 2.5–4.6)

## 2017-06-04 LAB — HEPATITIS PANEL, ACUTE
HEP A IGM: NEGATIVE
Hep B C IgM: POSITIVE — AB
Hepatitis B Surface Ag: POSITIVE — AB

## 2017-06-04 LAB — PROCALCITONIN: Procalcitonin: 38.78 ng/mL

## 2017-06-04 LAB — LACTIC ACID, PLASMA: Lactic Acid, Venous: 3.3 mmol/L (ref 0.5–1.9)

## 2017-06-04 MED ORDER — PANTOPRAZOLE SODIUM 40 MG PO PACK
40.0000 mg | PACK | Freq: Every day | ORAL | Status: DC
  Administered 2017-06-04 – 2017-06-09 (×6): 40 mg
  Filled 2017-06-04 (×6): qty 20

## 2017-06-04 MED ORDER — METOPROLOL TARTRATE 5 MG/5ML IV SOLN
2.5000 mg | INTRAVENOUS | Status: DC | PRN
  Administered 2017-06-04 – 2017-06-05 (×3): 2.5 mg via INTRAVENOUS
  Filled 2017-06-04 (×3): qty 5

## 2017-06-04 MED ORDER — FUROSEMIDE 10 MG/ML IJ SOLN
10.0000 mg | Freq: Two times a day (BID) | INTRAMUSCULAR | Status: DC
  Administered 2017-06-04 (×2): 10 mg via INTRAVENOUS
  Filled 2017-06-04 (×2): qty 2

## 2017-06-04 MED ORDER — INSULIN GLARGINE 100 UNIT/ML ~~LOC~~ SOLN
6.0000 [IU] | Freq: Every day | SUBCUTANEOUS | Status: DC
  Administered 2017-06-04: 6 [IU] via SUBCUTANEOUS
  Filled 2017-06-04: qty 0.06

## 2017-06-04 MED ORDER — INSULIN ASPART 100 UNIT/ML ~~LOC~~ SOLN
0.0000 [IU] | SUBCUTANEOUS | Status: DC
  Administered 2017-06-04: 2 [IU] via SUBCUTANEOUS
  Administered 2017-06-04: 3 [IU] via SUBCUTANEOUS
  Administered 2017-06-04: 8 [IU] via SUBCUTANEOUS
  Administered 2017-06-05: 3 [IU] via SUBCUTANEOUS
  Administered 2017-06-05: 2 [IU] via SUBCUTANEOUS
  Administered 2017-06-05: 3 [IU] via SUBCUTANEOUS
  Administered 2017-06-05: 2 [IU] via SUBCUTANEOUS
  Administered 2017-06-05: 8 [IU] via SUBCUTANEOUS
  Administered 2017-06-06 (×2): 3 [IU] via SUBCUTANEOUS
  Administered 2017-06-06: 2 [IU] via SUBCUTANEOUS
  Administered 2017-06-06: 3 [IU] via SUBCUTANEOUS
  Administered 2017-06-06 – 2017-06-07 (×2): 5 [IU] via SUBCUTANEOUS
  Administered 2017-06-07 (×2): 3 [IU] via SUBCUTANEOUS
  Administered 2017-06-07: 8 [IU] via SUBCUTANEOUS
  Administered 2017-06-07 (×2): 3 [IU] via SUBCUTANEOUS
  Administered 2017-06-08: 2 [IU] via SUBCUTANEOUS
  Administered 2017-06-08: 5 [IU] via SUBCUTANEOUS

## 2017-06-04 MED ORDER — INSULIN ASPART 100 UNIT/ML ~~LOC~~ SOLN: 0.0000 [IU] | Freq: Three times a day (TID) | SUBCUTANEOUS | Status: DC

## 2017-06-04 MED ORDER — JEVITY 1.2 CAL PO LIQD
1000.0000 mL | ORAL | Status: DC
  Administered 2017-06-04 – 2017-06-05 (×2): 1000 mL
  Filled 2017-06-04 (×2): qty 1000

## 2017-06-04 NOTE — Progress Notes (Signed)
PULMONARY / CRITICAL CARE MEDICINE   Name: Matthew Riley MRN: [id number] DOB: [birth year]    ADMISSION DATE:  06/03/2017  CHIEF COMPLAINT:  Altered mental status  HISTORY OF PRESENT ILLNESS:   42 year old male with PMH of DM, HTN, CAD s/p stent placement, GERD, and Tobacco abuse. Admission 04/2017 for DKA. Lives with sister. Using home health services.    Presented to ED on 12/31 after being found down in bathroom unresponsive covered in emesis and diarrhea. Upon arrival to ED patient hypotensive and hypoxic, requiring intubated. LA 5.07. Given Vancomycin/Zoysn and 3L NS.  AST/ALT 324/233, Crt 2.34. PCCM asked to admit.   Blood has grown strep viridans.  He reports painful teeth.  A right upper quadrant ultrasound to evaluate his transaminitis as well as elevated amylase and lipase did not show overt obstruction.  He remains intubated and mechanically ventilated today and is requiring a low-dose of levofed.     PAST MEDICAL HISTORY :  He  has a past medical history of Diabetes mellitus without complication ([place]), Hypertension, Renal disorder, and Stroke ([place]).  PAST SURGICAL HISTORY: He  has a past surgical history that includes Coronary angioplasty with stent.  No Known Allergies  No current facility-administered medications on file prior to encounter.    Current Outpatient Medications on File Prior to Encounter  Medication Sig  . aspirin EC 81 MG tablet Take 1 tablet (81 mg total) by mouth daily.  . cyclobenzaprine (FLEXERIL) 10 MG tablet Take 0.5 tablets (5 mg total) by mouth 3 (three) times daily as needed for muscle spasms.  . furosemide (LASIX) 20 MG tablet Take 1 tablet (20 mg total) by mouth daily.  Marland Kitchen gabapentin (NEURONTIN) 300 MG capsule Take 1 capsule (300 mg total) by mouth 3 (three) times daily.  Marland Kitchen ibuprofen (ADVIL,MOTRIN) 800 MG tablet Take 800 mg by mouth every 8 (eight) hours as needed for moderate pain.  . indomethacin (INDOCIN) 50 MG capsule Take 1 capsule (50 mg  total) by mouth 3 (three) times daily as needed for moderate pain. Take with a meal.  . insulin aspart (NOVOLOG) 100 UNIT/ML injection Inject 5 Units into the skin 3 (three) times daily with meals.  . insulin aspart protamine- aspart (NOVOLOG MIX 70/30) (70-30) 100 UNIT/ML injection Inject 0.38 mLs (38 Units total) into the skin 2 (two) times daily with a meal.  . lisinopril (PRINIVIL,ZESTRIL) 40 MG tablet Take 1 tablet (40 mg total) by mouth daily.  . methotrexate (RHEUMATREX) 2.5 MG tablet Take 3 tablets (7.5 mg total) by mouth once a week. Caution:Chemotherapy. Protect from light.  . mupirocin ointment (BACTROBAN) 2 % Apply 1 application topically 2 (two) times daily. For 7 days.  . ondansetron (ZOFRAN) 4 MG tablet Take 1 tablet (4 mg total) by mouth every 8 (eight) hours as needed for nausea.  . pantoprazole (PROTONIX) 40 MG tablet Take 1 tablet (40 mg total) by mouth daily.  . blood glucose meter kit and supplies Dispense based on patient and insurance preference. Use up to four times daily as directed. (FOR ICD-9 250.00, 250.01).  . traMADol (ULTRAM) 50 MG tablet Take 1 tablet (50 mg total) by mouth every 8 (eight) hours as needed for severe pain.    FAMILY HISTORY:  His has no family status information on file.    SOCIAL HISTORY: He  reports that he has quit smoking. he has never used smokeless tobacco. He reports that he drinks alcohol. He reports that he does not use drugs.  REVIEW  OF SYSTEMS:   Not obtainable  SUBJECTIVE:  As above  VITAL SIGNS: BP (!) 88/63   Pulse 91   Temp 100 F (37.8 C)   Resp (!) 25   Ht '6\' 1"'  (1.854 m)   Wt 219 lb 9.3 oz (99.6 kg)   SpO2 99%   BMI 28.97 kg/m   HEMODYNAMICS: CVP:  [9 mmHg-16 mmHg] 16 mmHg  VENTILATOR SETTINGS: Vent Mode: PRVC FiO2 (%):  [40 %-50 %] 40 % Set Rate:  [25 bmp] 25 bmp Vt Set:  [640 mL] 640 mL PEEP:  [8 cmH20] 8 cmH20 Plateau Pressure:  [26 cmH20-31 cmH20] 29 cmH20  INTAKE / OUTPUT: I/O last 3 completed  shifts: In: 10793.1 [I.V.:7193.1; IV RJJOACZYS:0630] Out: 2465 [Urine:2465]  PHYSICAL EXAMINATION: General: Orally intubated and mechanically ventilated but alert and interactive and nodding appropriately to questions.  He is in no distress.  Cardiovascular: S1 and S2 are regular there may be a soft S4 I do not hear a rub murmur after prolonged auscultation. Lungs: There is symmetric air movement, respirations are unlabored, there is some scattered rhonchi, and no wheezes. Abdomen: The abdomen is flat and soft without any organomegaly masses tenderness guarding or rebound. Musculoskeletal: There is 2-3+ dependent edema.   LABS:  BMET Recent Labs  Lab 06/03/17 0127 06/03/17 0159 06/03/17 0448 06/04/17 0451  NA 129* 135 134* 130*  K 4.5 5.3* 4.1 5.6*  CL 102 109 107 104  CO2 14*  --  18* 21*  BUN 34* 50* 34* 30*  CREATININE 2.34* 2.40* 2.26* 1.71*  GLUCOSE 234* 239* 178* 200*    Electrolytes Recent Labs  Lab 06/03/17 0127 06/03/17 0448 06/04/17 0451  CALCIUM 8.5* 7.5* 7.7*  MG  --  1.4* 1.7  PHOS  --  5.1* 4.2    CBC Recent Labs  Lab 06/03/17 0127 06/03/17 0159 06/04/17 0451  WBC 8.9  --  16.3*  HGB 14.2 16.3 11.0*  HCT 41.4 48.0 32.5*  PLT 192  --  211    Coag's No results for input(s): APTT, INR in the last 168 hours.  Sepsis Markers Recent Labs  Lab 06/03/17 0139 06/03/17 0448 06/03/17 0713 06/03/17 0944 06/04/17 0451  LATICACIDVEN 5.07*  --  1.4 4.3*  --   PROCALCITON  --  9.42  --   --  38.78    ABG Recent Labs  Lab 06/03/17 0644 06/03/17 0653 06/03/17 1655  PHART 7.189* 7.279* 7.231*  PCO2ART 53.9* 37.5 39.8  PO2ART 30.0* 65.0* 82.0*    Liver Enzymes Recent Labs  Lab 06/03/17 0127  AST 324*  ALT 233*  ALKPHOS 154*  BILITOT 0.8  ALBUMIN 3.2*    Cardiac Enzymes No results for input(s): TROPONINI, PROBNP in the last 168 hours.  Glucose Recent Labs  Lab 06/03/17 1631 06/03/17 1947 06/03/17 2312 06/04/17 0352  06/04/17 0823 06/04/17 1139  GLUCAP 188* 157* 200* 135* 205* 232*    Imaging Dg Chest Port 1 View  Result Date: 06/04/2017 CLINICAL DATA:  Respiratory distress EXAM: PORTABLE CHEST 1 VIEW COMPARISON:  06/03/2017 FINDINGS: Endotracheal tube, nasogastric catheter and right jugular central line are again seen. Patchy perihilar consolidation is noted, stable in appearance. No new focal abnormality is noted. No bony abnormality is seen. IMPRESSION: Stable bilateral consolidation. Electronically Signed   By: Inez Catalina M.D.   On: 06/04/2017 08:00      CULTURES: Admission blood cultures are growing strep viridans.  ANTIBIOTICS: Vancomycin and Zosyn.  DISCUSSION: This is a 42 year old with  history of coronary artery disease and diabetes who presented with altered mental status.  He was overtly septic on presentation has had a elevated lactate and has a rising pro calcitonin.  We have isolated strep viridans in 2 out of 2 sets of blood cultures.  ASSESSMENT / PLAN:  PULMONARY A: He has diffuse bilateral infiltrates.  Unfortunately with strep viridans growing from his blood I am concerned that his infiltrates may be on the basis of mitral insufficiency.  Going to begin gently diuresing him as his blood pressure and lactate allow.  An echocardiogram to confirm this impression is pending   CARDIOVASCULAR A: As above.  I am concerned that he is respiratory failure is in part due to viridans endocarditis with valvular insufficiency.  An echocardiogram has been ordered, a BNP is pending.   INFECTIOUS A: I am anticipating narrowing his antibiotics to a penicillin infusion once the diagnosis of strep viridans is confirmed with sensitivities.    ENDOCRINE A: I have added a long-acting insulin and increase his as needed dose of insulin.   Rater than 32 minutes was spent in the care of this patient today  Lars Masson, MD Pulmonary and [place] Pager: [phone]  06/04/2017, 12:27 PM

## 2017-06-04 NOTE — Progress Notes (Signed)
eLink Physician-Brief Progress Note Patient Name: Matthew Riley DOB: January 30, 1976 MRN: [id number]   Date of Service  06/04/2017  HPI/Events of Note  Sinus Tachycardia - HR = 122. BP = 95/72. Temp = 38.3 F. Suspect that this may be d/t fever.   eICU Interventions  Will order: 1. Treat fever as already ordered.  2. Metoprolol 2.5 mg IV Q 3 hours PRN HR > 115. Hold dose for SBP < 100.      Intervention Category Major Interventions: Arrhythmia - evaluation and management  [name] 06/04/2017, 6:01 AM

## 2017-06-05 ENCOUNTER — Inpatient Hospital Stay (HOSPITAL_COMMUNITY): Payer: Medicaid Other

## 2017-06-05 ENCOUNTER — Ambulatory Visit: Payer: Self-pay

## 2017-06-05 DIAGNOSIS — I361 Nonrheumatic tricuspid (valve) insufficiency: Secondary | ICD-10-CM

## 2017-06-05 LAB — BASIC METABOLIC PANEL
Anion gap: 5 (ref 5–15)
BUN: 38 mg/dL — AB (ref 6–20)
CHLORIDE: 104 mmol/L (ref 101–111)
CO2: 22 mmol/L (ref 22–32)
CREATININE: 1.95 mg/dL — AB (ref 0.61–1.24)
Calcium: 7.9 mg/dL — ABNORMAL LOW (ref 8.9–10.3)
GFR calc Af Amer: 47 mL/min — ABNORMAL LOW (ref >60)
GFR calc non Af Amer: 40 mL/min — ABNORMAL LOW (ref >60)
Glucose, Bld: 290 mg/dL — ABNORMAL HIGH (ref 65–99)
Potassium: 4.7 mmol/L (ref 3.5–5.1)
SODIUM: 131 mmol/L — AB (ref 135–145)

## 2017-06-05 LAB — ECHOCARDIOGRAM COMPLETE
HEIGHTINCHES: 73 in
Weight: 3495.61 oz

## 2017-06-05 LAB — CBC WITH DIFFERENTIAL/PLATELET
BAND NEUTROPHILS: 11 %
BASOS ABS: 0 10*3/uL (ref 0.0–0.1)
Basophils Relative: 0 %
Blasts: 0 %
EOS ABS: 0.2 10*3/uL (ref 0.0–0.7)
Eosinophils Relative: 1 %
HCT: 26.4 % — ABNORMAL LOW (ref 39.0–52.0)
Hemoglobin: 9.1 g/dL — ABNORMAL LOW (ref 13.0–17.0)
Lymphocytes Relative: 10 %
Lymphs Abs: 1.7 10*3/uL (ref 0.7–4.0)
MCH: 29.9 pg (ref 26.0–34.0)
MCHC: 34.5 g/dL (ref 30.0–36.0)
MCV: 86.8 fL (ref 78.0–100.0)
METAMYELOCYTES PCT: 1 %
MONO ABS: 0.5 10*3/uL (ref 0.1–1.0)
MYELOCYTES: 0 %
Monocytes Relative: 3 %
Neutro Abs: 14.9 10*3/uL — ABNORMAL HIGH (ref 1.7–7.7)
Neutrophils Relative %: 74 %
Other: 0 %
PLATELETS: 198 10*3/uL (ref 150–400)
Promyelocytes Absolute: 0 %
RBC: 3.04 MIL/uL — AB (ref 4.22–5.81)
RDW: 14.4 % (ref 11.5–15.5)
WBC: 17.3 10*3/uL — AB (ref 4.0–10.5)
nRBC: 0 /100 WBC

## 2017-06-05 LAB — GLUCOSE, CAPILLARY
Glucose-Capillary: 115 mg/dL — ABNORMAL HIGH (ref 65–99)
Glucose-Capillary: 137 mg/dL — ABNORMAL HIGH (ref 65–99)
Glucose-Capillary: 138 mg/dL — ABNORMAL HIGH (ref 65–99)
Glucose-Capillary: 166 mg/dL — ABNORMAL HIGH (ref 65–99)
Glucose-Capillary: 190 mg/dL — ABNORMAL HIGH (ref 65–99)
Glucose-Capillary: 261 mg/dL — ABNORMAL HIGH (ref 65–99)
Glucose-Capillary: 287 mg/dL — ABNORMAL HIGH (ref 65–99)

## 2017-06-05 LAB — BLOOD GAS, ARTERIAL
ACID-BASE DEFICIT: 2 mmol/L (ref 0.0–2.0)
BICARBONATE: 21.9 mmol/L (ref 20.0–28.0)
Drawn by: 330991
FIO2: 40
LHR: 25 {breaths}/min
O2 SAT: 97.2 %
PEEP/CPAP: 8 cmH2O
Patient temperature: 100.4
VT: 640 mL
pCO2 arterial: 36.8 mmHg (ref 32.0–48.0)
pH, Arterial: 7.398 (ref 7.350–7.450)
pO2, Arterial: 108 mmHg (ref 83.0–108.0)

## 2017-06-05 LAB — GASTROINTESTINAL PANEL BY PCR, STOOL (REPLACES STOOL CULTURE)

## 2017-06-05 LAB — BRAIN NATRIURETIC PEPTIDE: B NATRIURETIC PEPTIDE 5: 132.9 pg/mL — AB (ref 0.0–100.0)

## 2017-06-05 LAB — PROCALCITONIN: Procalcitonin: 26.37 ng/mL

## 2017-06-05 MED ORDER — INSULIN GLARGINE 100 UNIT/ML ~~LOC~~ SOLN
9.0000 [IU] | Freq: Every day | SUBCUTANEOUS | Status: DC
  Administered 2017-06-05 – 2017-06-06 (×2): 9 [IU] via SUBCUTANEOUS
  Filled 2017-06-05 (×2): qty 0.09

## 2017-06-05 MED ORDER — FUROSEMIDE 10 MG/ML IJ SOLN
20.0000 mg | Freq: Two times a day (BID) | INTRAMUSCULAR | Status: DC
  Administered 2017-06-05 – 2017-06-09 (×8): 20 mg via INTRAVENOUS
  Filled 2017-06-05 (×8): qty 2

## 2017-06-05 NOTE — Progress Notes (Signed)
  Echocardiogram 2D Echocardiogram has been performed.  Janalyn Harder 06/05/2017, 12:18 PM

## 2017-06-05 NOTE — Progress Notes (Signed)
PULMONARY / CRITICAL CARE MEDICINE   Name: Matthew Riley MRN: [id number] DOB: 10-21-1975    ADMISSION DATE:  06/03/2017  CHIEF COMPLAINT:  Altered mental status  HISTORY OF PRESENT ILLNESS:   42 year old male with PMH of DM, HTN, CAD s/p stent placement, GERD, and Tobacco abuse. Admission 04/2017 for DKA. Lives with sister. Using home health services.    Presented to ED on 12/31 after being found down in bathroom unresponsive covered in emesis and diarrhea. Upon arrival to ED patient hypotensive and hypoxic, requiring intubated. LA 5.07. Given Vancomycin/Zoysn and 3L NS.  AST/ALT 324/233, Crt 2.34. PCCM asked to admit.   Blood has grown strep viridans in one bottle.  He reports painful teeth. Echo 12/31 with poor windows, Ef 35%. Mixed flora on sputum gram stain.  A right upper quadrant ultrasound to evaluate his transaminitis as well as elevated amylase and lipase did not show overt obstruction.  He remains intubated and mechanically ventilated today. He is very alert and interactive. Off pressors     PAST MEDICAL HISTORY :  He  has a past medical history of Diabetes mellitus without complication ([place]), Hypertension, Renal disorder, and Stroke ([place]).  PAST SURGICAL HISTORY: He  has a past surgical history that includes Coronary angioplasty with stent.  No Known Allergies  No current facility-administered medications on file prior to encounter.    Current Outpatient Medications on File Prior to Encounter  Medication Sig  . aspirin EC 81 MG tablet Take 1 tablet (81 mg total) by mouth daily.  . cyclobenzaprine (FLEXERIL) 10 MG tablet Take 0.5 tablets (5 mg total) by mouth 3 (three) times daily as needed for muscle spasms.  . furosemide (LASIX) 20 MG tablet Take 1 tablet (20 mg total) by mouth daily.  Marland Kitchen gabapentin (NEURONTIN) 300 MG capsule Take 1 capsule (300 mg total) by mouth 3 (three) times daily.  Marland Kitchen ibuprofen (ADVIL,MOTRIN) 800 MG tablet Take 800 mg by mouth every 8 (eight)  hours as needed for moderate pain.  . indomethacin (INDOCIN) 50 MG capsule Take 1 capsule (50 mg total) by mouth 3 (three) times daily as needed for moderate pain. Take with a meal.  . insulin aspart (NOVOLOG) 100 UNIT/ML injection Inject 5 Units into the skin 3 (three) times daily with meals.  . insulin aspart protamine- aspart (NOVOLOG MIX 70/30) (70-30) 100 UNIT/ML injection Inject 0.38 mLs (38 Units total) into the skin 2 (two) times daily with a meal.  . lisinopril (PRINIVIL,ZESTRIL) 40 MG tablet Take 1 tablet (40 mg total) by mouth daily.  . methotrexate (RHEUMATREX) 2.5 MG tablet Take 3 tablets (7.5 mg total) by mouth once a week. Caution:Chemotherapy. Protect from light.  . mupirocin ointment (BACTROBAN) 2 % Apply 1 application topically 2 (two) times daily. For 7 days.  . ondansetron (ZOFRAN) 4 MG tablet Take 1 tablet (4 mg total) by mouth every 8 (eight) hours as needed for nausea.  . pantoprazole (PROTONIX) 40 MG tablet Take 1 tablet (40 mg total) by mouth daily.  . blood glucose meter kit and supplies Dispense based on patient and insurance preference. Use up to four times daily as directed. (FOR ICD-9 250.00, 250.01).  . traMADol (ULTRAM) 50 MG tablet Take 1 tablet (50 mg total) by mouth every 8 (eight) hours as needed for severe pain.    FAMILY HISTORY:  His has no family status information on file.    SOCIAL HISTORY: He  reports that he has quit smoking. he has never used smokeless  tobacco. He reports that he drinks alcohol. He reports that he does not use drugs.  REVIEW OF SYSTEMS:   Not obtainable  SUBJECTIVE:  As above  VITAL SIGNS: BP 95/68   Pulse 91   Temp 99.3 F (37.4 C) (Core (Comment)) Comment (Src): Foley  Resp 19   Ht '6\' 1"'  (1.854 m)   Wt 218 lb 7.6 oz (99.1 kg)   SpO2 100%   BMI 28.82 kg/m   HEMODYNAMICS: CVP:  [16 mmHg-20 mmHg] 17 mmHg  VENTILATOR SETTINGS: Vent Mode: PRVC FiO2 (%):  [40 %-50 %] 40 % Set Rate:  [25 bmp] 25 bmp Vt Set:  [640  mL] 640 mL PEEP:  [8 cmH20] 8 cmH20 Plateau Pressure:  [26 cmH20-31 cmH20] 28 cmH20  INTAKE / OUTPUT: I/O last 3 completed shifts: In: 4258.3 [I.V.:3894.6; NG/GT:213.8; IV Piggyback:150] Out: 1610 [Urine:3340]  PHYSICAL EXAMINATION: General: Orally intubated and mechanically ventilated but alert and interactive and nodding appropriately to questions.  He is in no distress.  Cardiovascular: S1 and S2 are regular there may be a soft S4 I do not hear a rub or murmur after prolonged auscultation. Lungs: There is symmetric air movement, respirations are unlabored, there are few scattered rhonchi, and no wheezes. Abdomen: The abdomen is flat and soft without any organomegaly masses tenderness guarding or rebound. Musculoskeletal: There is 2+ dependent edema.   LABS:  BMET Recent Labs  Lab 06/03/17 0448 06/04/17 0451 06/05/17 0540  NA 134* 130* 131*  K 4.1 5.6* 4.7  CL 107 104 104  CO2 18* 21* 22  BUN 34* 30* 38*  CREATININE 2.26* 1.71* 1.95*  GLUCOSE 178* 200* 290*    Electrolytes Recent Labs  Lab 06/03/17 0448 06/04/17 0451 06/05/17 0540  CALCIUM 7.5* 7.7* 7.9*  MG 1.4* 1.7  --   PHOS 5.1* 4.2  --     CBC Recent Labs  Lab 06/03/17 0127 06/03/17 0159 06/04/17 0451 06/05/17 0540  WBC 8.9  --  16.3* 17.3*  HGB 14.2 16.3 11.0* 9.1*  HCT 41.4 48.0 32.5* 26.4*  PLT 192  --  211 198    Coag's No results for input(s): APTT, INR in the last 168 hours.  Sepsis Markers Recent Labs  Lab 06/03/17 0448 06/03/17 0713 06/03/17 0944 06/04/17 0451 06/04/17 1209 06/05/17 0540  LATICACIDVEN  --  1.4 4.3*  --  3.3*  --   PROCALCITON 9.42  --   --  38.78  --  26.37    ABG Recent Labs  Lab 06/03/17 0653 06/03/17 1655 06/05/17 0451  PHART 7.279* 7.231* 7.398  PCO2ART 37.5 39.8 36.8  PO2ART 65.0* 82.0* 108    Liver Enzymes Recent Labs  Lab 06/03/17 0127  AST 324*  ALT 233*  ALKPHOS 154*  BILITOT 0.8  ALBUMIN 3.2*    Cardiac Enzymes No results for  input(s): TROPONINI, PROBNP in the last 168 hours.  Glucose Recent Labs  Lab 06/04/17 0823 06/04/17 1139 06/04/17 1603 06/04/17 2023 06/04/17 2320 06/05/17 0354  GLUCAP 205* 232* 136* 161* 287* 261*    Imaging ECHO 12/31 shoed an Ef=35%    CULTURES: Admission blood cultures are growing strep viridans.  ANTIBIOTICS: Vancomycin and Zosyn.  DISCUSSION: This is a 42 year old with history of coronary artery disease and diabetes who presented with altered mental status.  He was overtly septic on presentation has had a elevated lactate and has a rising pro calcitonin.  We have isolated strep viridans in 2 out of 2 sets of blood cultures.  ASSESSMENT / PLAN:  PULMONARY A: He has diffuse bilateral infiltrates. How mch of this is secondary to infection/ aspiration and how much CHF not clear. Lactate has cleared, will begin diuresing more agressively. Continue empiric antibiotics. Strep viridans appears to have grown in only one bottle. Repeating blood cultures, if still positive will need TEE    CARDIOVASCULAR A: As above.  I am concerned that he is respiratory failure is in part due to CHF, viridans endocarditis with valvular insufficiency also a possibility in the back of my mind. .  Repeating blood culutures. Beginning  To diurese more aggressively.    INFECTIOUS A: Only one blood culture positive for strep viridians. Repeating blood cultures. Continue broad coverage for aspiration.    ENDOCRINE A: I have increased his long-acting insulin today.   Greater than 32 minutes was spent in the care of this patient today  Lars Masson, MD Pulmonary and [place] Pager: [phone]  06/05/2017, 8:43 AM

## 2017-06-06 ENCOUNTER — Inpatient Hospital Stay (HOSPITAL_COMMUNITY): Payer: Medicaid Other

## 2017-06-06 DIAGNOSIS — G9340 Encephalopathy, unspecified: Secondary | ICD-10-CM

## 2017-06-06 LAB — BLOOD GAS, ARTERIAL
ACID-BASE EXCESS: 0.2 mmol/L (ref 0.0–2.0)
BICARBONATE: 25.8 mmol/L (ref 20.0–28.0)
DRAWN BY: 418751
FIO2: 40
O2 Saturation: 95.7 %
PEEP/CPAP: 5 cmH2O
PRESSURE CONTROL: 23 cmH2O
Patient temperature: 98.6
RATE: 18 resp/min
pCO2 arterial: 53.3 mmHg — ABNORMAL HIGH (ref 32.0–48.0)
pH, Arterial: 7.306 — ABNORMAL LOW (ref 7.350–7.450)
pO2, Arterial: 87.1 mmHg (ref 83.0–108.0)

## 2017-06-06 LAB — CBC WITH DIFFERENTIAL/PLATELET
BASOS ABS: 0 10*3/uL (ref 0.0–0.1)
Basophils Relative: 0 %
EOS ABS: 0.4 10*3/uL (ref 0.0–0.7)
Eosinophils Relative: 2 %
HCT: 30.2 % — ABNORMAL LOW (ref 39.0–52.0)
Hemoglobin: 10 g/dL — ABNORMAL LOW (ref 13.0–17.0)
Lymphocytes Relative: 14 %
Lymphs Abs: 2.7 10*3/uL (ref 0.7–4.0)
MCH: 29.6 pg (ref 26.0–34.0)
MCHC: 33.1 g/dL (ref 30.0–36.0)
MCV: 89.3 fL (ref 78.0–100.0)
MONO ABS: 1.8 10*3/uL — AB (ref 0.1–1.0)
Monocytes Relative: 9 %
NEUTROS PCT: 75 %
Neutro Abs: 14.7 10*3/uL — ABNORMAL HIGH (ref 1.7–7.7)
PLATELETS: 257 10*3/uL (ref 150–400)
RBC: 3.38 MIL/uL — AB (ref 4.22–5.81)
RDW: 14.5 % (ref 11.5–15.5)
WBC: 19.6 10*3/uL — AB (ref 4.0–10.5)

## 2017-06-06 LAB — BASIC METABOLIC PANEL
ANION GAP: 4 — AB (ref 5–15)
BUN: 33 mg/dL — AB (ref 6–20)
CHLORIDE: 103 mmol/L (ref 101–111)
CO2: 26 mmol/L (ref 22–32)
Calcium: 8.2 mg/dL — ABNORMAL LOW (ref 8.9–10.3)
Creatinine, Ser: 1.62 mg/dL — ABNORMAL HIGH (ref 0.61–1.24)
GFR calc Af Amer: 59 mL/min — ABNORMAL LOW (ref >60)
GFR calc non Af Amer: 50 mL/min — ABNORMAL LOW (ref >60)
Glucose, Bld: 224 mg/dL — ABNORMAL HIGH (ref 65–99)
POTASSIUM: 4.4 mmol/L (ref 3.5–5.1)
SODIUM: 133 mmol/L — AB (ref 135–145)

## 2017-06-06 LAB — CULTURE, RESPIRATORY W GRAM STAIN: Culture: NORMAL

## 2017-06-06 LAB — PROCALCITONIN: PROCALCITONIN: 9.4 ng/mL

## 2017-06-06 LAB — GLUCOSE, CAPILLARY
GLUCOSE-CAPILLARY: 153 mg/dL — AB (ref 65–99)
GLUCOSE-CAPILLARY: 182 mg/dL — AB (ref 65–99)
GLUCOSE-CAPILLARY: 204 mg/dL — AB (ref 65–99)
GLUCOSE-CAPILLARY: 210 mg/dL — AB (ref 65–99)
Glucose-Capillary: 158 mg/dL — ABNORMAL HIGH (ref 65–99)
Glucose-Capillary: 124 mg/dL — ABNORMAL HIGH (ref 65–99)

## 2017-06-06 LAB — CULTURE, RESPIRATORY

## 2017-06-06 LAB — MAGNESIUM: MAGNESIUM: 2 mg/dL (ref 1.7–2.4)

## 2017-06-06 MED ORDER — LORAZEPAM 2 MG/ML IJ SOLN: 2.0000 mg | Freq: Once | INTRAMUSCULAR | Status: AC

## 2017-06-06 MED ORDER — LORAZEPAM 2 MG/ML IJ SOLN
INTRAMUSCULAR | Status: AC
  Filled 2017-06-06: qty 1

## 2017-06-06 MED ORDER — PRO-STAT SUGAR FREE PO LIQD
30.0000 mL | Freq: Two times a day (BID) | ORAL | Status: DC
  Administered 2017-06-06 – 2017-06-12 (×9): 30 mL
  Filled 2017-06-06 (×9): qty 30

## 2017-06-06 MED ORDER — THIAMINE HCL 100 MG/ML IJ SOLN
100.0000 mg | Freq: Every day | INTRAMUSCULAR | Status: DC
  Administered 2017-06-06 – 2017-06-10 (×5): 100 mg via INTRAVENOUS
  Filled 2017-06-06 (×5): qty 2

## 2017-06-06 MED ORDER — GLUCERNA 1.2 CAL PO LIQD
1000.0000 mL | ORAL | Status: DC
  Administered 2017-06-06 – 2017-06-07 (×2): 1000 mL
  Filled 2017-06-06 (×10): qty 1000

## 2017-06-06 MED ORDER — LORAZEPAM 2 MG/ML IJ SOLN
2.0000 mg | Freq: Once | INTRAMUSCULAR | Status: AC
  Administered 2017-06-06: 2 mg via INTRAVENOUS

## 2017-06-06 NOTE — Progress Notes (Signed)
EEG complete - results pending 

## 2017-06-06 NOTE — Progress Notes (Signed)
At 0130, Pt began showing signs of seizure activity with rhythmic movements in eyes, mouth, and arms. Pt also was less responsive, not following commands, and showing no movement to painful stimuli in lower extremities.  Notified CCM and Ativan was given. MD ordered EEG in the am,  no further actions at this time. Will continue to monitor

## 2017-06-06 NOTE — Progress Notes (Signed)
Pharmacy Antibiotic Note  Matthew Riley is a 42 y.o. male admitted on 06/03/2017 with loss of consciousness and suspected sepsis. Patient remains on day #4 of vanc/zosyn. Patient grew up 1/2 blood cultures with strep viridans, repeat blood cultures are negative. Normally 1/2 strep viridans could be a contaminant; however I did note that the patient has leukocytosis and had a fever in the past 24 hours Tm/24h 101.7.   Plan: Vancomycin 1250 mg IV q24h - I would discontinue vancomycin  Zosyn 3.375 g IV q8h Vancomycin trough as needed Monitor for changes in renal function, clinical picture, and culture data F/u length of therapy    Height: 6\' 1"  (185.4 cm) Weight: 224 lb 6.9 oz (101.8 kg) IBW/kg (Calculated) : 79.9  Temp (24hrs), Avg:99.7 F (37.6 C), Min:97.7 F (36.5 C), Max:101.7 F (38.7 C)  Recent Labs  Lab 06/03/17 0127 06/03/17 0139 06/03/17 0159 06/03/17 0448 06/03/17 0713 06/03/17 0944 06/04/17 0451 06/04/17 1209 06/05/17 0540 06/06/17 0455  WBC 8.9  --   --   --   --   --  16.3*  --  17.3* 19.6*  CREATININE 2.34*  --  2.40* 2.26*  --   --  1.71*  --  1.95* 1.62*  LATICACIDVEN  --  5.07*  --   --  1.4 4.3*  --  3.3*  --   --     Estimated Creatinine Clearance: 73.8 mL/min (A) (by C-G formula based on SCr of 1.62 mg/dL (H)).     1/2 blood cx: ngtd 12/31 Blood cx: 1 of 2 with viridans strep  12/31 resp cx: neg  12/31 Vanc >>  12/31 Zosyn >>    1/32  06/06/2017 9:35 AM

## 2017-06-06 NOTE — Progress Notes (Signed)
Nutrition Follow-up  INTERVENTION:   D/C Jevity 1.2  Glucerna 1.2 @ 70 ml/hr  30 ml Prostat BID Provides: 2216 kcal, 130 grams protein, and 1362 ml free water.  192 grams carbohydrate   NUTRITION DIAGNOSIS:   Inadequate oral intake related to inability to eat as evidenced by NPO status. Ongoing.   GOAL:   Patient will meet greater than or equal to 90% of their needs Progressing.   MONITOR:   Vent status, I & O's  ASSESSMENT:   Pt with PMH of type 1 DM, HTN, CAD s/p stent, GERD, with recent admission 04/2017 for DKA. Pt lives with sister. Admitted with acute hypoxic respiratory failure d/t aspiration PNA and septic shock.  Pt discussed during ICU rounds and with RN.  1/1 trickle TF started (Jevity 1.2 @ 15 ml/hr provides: 432 kcal and 20 grams protein) Per CCM pt with diffuse bilateral infiltrates due to infection, aspiration, or CHF. Pt on lasix. Possible seizure this am.   Patient is currently intubated on ventilator support MV: 11.4 L/min Temp (24hrs), Avg:100.1 F (37.8 C), Min:98.6 F (37 C), Max:101.7 F (38.7 C)  Medications reviewed and include: lasix, novolog, lantus, thiamine  Labs reviewed CBG's: 158-153    Diet Order:  Diet NPO time specified  EDUCATION NEEDS:   No education needs have been identified at this time  Skin:  Skin Assessment: Reviewed RN Assessment  Last BM:  1/2 rectal tube placed  Height:   Ht Readings from Last 1 Encounters:  06/03/17 6\' 1"  (1.854 m)    Weight:   Wt Readings from Last 1 Encounters:  06/06/17 224 lb 6.9 oz (101.8 kg)    Ideal Body Weight:  83.6 kg  BMI:  Body mass index is 29.61 kg/m.  Estimated Nutritional Needs:   Kcal:  2263  Protein:  125-145 grams  Fluid:  > 2 L/day   2264 RD, LDN, CNSC [phone] Pager [phone] After Hours Pager

## 2017-06-06 NOTE — Progress Notes (Signed)
eLink Physician-Brief Progress Note Patient Name: Matthew Riley DOB: 03/19/1976 MRN: [id number]   Date of Service  06/06/2017  HPI/Events of Note  Questionable seizure activity noted on camera - eyelids & lip twitching, less responsive per RN Head CT neg 12/31  eICU Interventions  Ativan 2 mg IV now EEG     Intervention Category Major Interventions: Seizures - evaluation and management  Matthew Riley V. [name] 06/06/2017, 1:37 AM

## 2017-06-06 NOTE — Progress Notes (Signed)
PULMONARY / CRITICAL CARE MEDICINE   Name: Matthew Riley MRN: [id number] DOB: 1975-12-29    ADMISSION DATE:  06/03/2017  CHIEF COMPLAINT:  Altered mental status  HISTORY OF PRESENT ILLNESS:   42 year old male with PMH of DM, HTN, CAD s/p stent placement, GERD, and Tobacco abuse. Admission 04/2017 for DKA. Lives with sister. Using home health services.    Presented to ED on 12/31 after being found down in bathroom unresponsive covered in emesis and diarrhea. Upon arrival to ED patient hypotensive and hypoxic, requiring intubated. LA 5.07. Given Vancomycin/Zoysn and 3L NS.  AST/ALT 324/233, Crt 2.34. PCCM asked to admit.   Blood has grown strep viridans in one bottle.  He reports painful teeth. Echo 12/31 with poor windows, Ef 35%.Repeat echo 1/2 shows no vegetations and a little better Ef at 40-45%. Mixed flora on sputum gram stain.  A right upper quadrant ultrasound to evaluate his transaminitis as well as elevated amylase and lipase did not show overt obstruction.  Seizure like activity followed by a prolonged period of decreased responsiveness this morning. He remains intubated and mechanically ventilated. He is much less interactive for me.     PAST MEDICAL HISTORY :  He  has a past medical history of Diabetes mellitus without complication ([place]), Hypertension, Renal disorder, and Stroke ([place]).  PAST SURGICAL HISTORY: He  has a past surgical history that includes Coronary angioplasty with stent.  No Known Allergies  No current facility-administered medications on file prior to encounter.    Current Outpatient Medications on File Prior to Encounter  Medication Sig  . aspirin EC 81 MG tablet Take 1 tablet (81 mg total) by mouth daily.  . cyclobenzaprine (FLEXERIL) 10 MG tablet Take 0.5 tablets (5 mg total) by mouth 3 (three) times daily as needed for muscle spasms.  . furosemide (LASIX) 20 MG tablet Take 1 tablet (20 mg total) by mouth daily.  Marland Kitchen gabapentin (NEURONTIN) 300 MG  capsule Take 1 capsule (300 mg total) by mouth 3 (three) times daily.  Marland Kitchen ibuprofen (ADVIL,MOTRIN) 800 MG tablet Take 800 mg by mouth every 8 (eight) hours as needed for moderate pain.  . indomethacin (INDOCIN) 50 MG capsule Take 1 capsule (50 mg total) by mouth 3 (three) times daily as needed for moderate pain. Take with a meal.  . insulin aspart (NOVOLOG) 100 UNIT/ML injection Inject 5 Units into the skin 3 (three) times daily with meals.  . insulin aspart protamine- aspart (NOVOLOG MIX 70/30) (70-30) 100 UNIT/ML injection Inject 0.38 mLs (38 Units total) into the skin 2 (two) times daily with a meal.  . lisinopril (PRINIVIL,ZESTRIL) 40 MG tablet Take 1 tablet (40 mg total) by mouth daily.  . methotrexate (RHEUMATREX) 2.5 MG tablet Take 3 tablets (7.5 mg total) by mouth once a week. Caution:Chemotherapy. Protect from light.  . mupirocin ointment (BACTROBAN) 2 % Apply 1 application topically 2 (two) times daily. For 7 days.  . ondansetron (ZOFRAN) 4 MG tablet Take 1 tablet (4 mg total) by mouth every 8 (eight) hours as needed for nausea.  . pantoprazole (PROTONIX) 40 MG tablet Take 1 tablet (40 mg total) by mouth daily.  . blood glucose meter kit and supplies Dispense based on patient and insurance preference. Use up to four times daily as directed. (FOR ICD-9 250.00, 250.01).  . traMADol (ULTRAM) 50 MG tablet Take 1 tablet (50 mg total) by mouth every 8 (eight) hours as needed for severe pain.    FAMILY HISTORY:  His has no  family status information on file.    SOCIAL HISTORY: He  reports that he has quit smoking. he has never used smokeless tobacco. He reports that he drinks alcohol. He reports that he does not use drugs.  REVIEW OF SYSTEMS:   Not obtainable  SUBJECTIVE:  As above  VITAL SIGNS: BP 112/82   Pulse (!) 101   Temp 99.3 F (37.4 C)   Resp 20   Ht '6\' 1"'  (1.854 m)   Wt 224 lb 6.9 oz (101.8 kg)   SpO2 95%   BMI 29.61 kg/m   HEMODYNAMICS: CVP:  [9 mmHg-18 mmHg] 17  mmHg  VENTILATOR SETTINGS: Vent Mode: PCV FiO2 (%):  [40 %] 40 % Set Rate:  [18 bmp-25 bmp] 18 bmp Vt Set:  [640 mL] 640 mL PEEP:  [5 cmH20-8 cmH20] 5 cmH20 Plateau Pressure:  [21 cmH20-28 cmH20] 21 cmH20  INTAKE / OUTPUT: I/O last 3 completed shifts: In: 2521.2 [I.V.:1349.2; NG/GT:522; IV Piggyback:650] Out: 3600 [Urine:3600]  PHYSICAL EXAMINATION: General: Orally intubated and mechanically ventilated. Not responding to voice for me today. On 100 mcg Fentanyl during my exam. He is in no distress.  Cardiovascular: S1 and S2 are regular. Positive S4, no rub or murmur. Lungs: There is symmetric air movement, respirations are unlabored, there are few scattered rhonchi, and no wheezes. Abdomen: The abdomen is flat and soft without any organomegaly masses tenderness guarding or rebound. Musculoskeletal: There is 2+ dependent edema.   LABS:  BMET Recent Labs  Lab 06/04/17 0451 06/05/17 0540 06/06/17 0455  NA 130* 131* 133*  K 5.6* 4.7 4.4  CL 104 104 103  CO2 21* 22 26  BUN 30* 38* 33*  CREATININE 1.71* 1.95* 1.62*  GLUCOSE 200* 290* 224*    Electrolytes Recent Labs  Lab 06/03/17 0448 06/04/17 0451 06/05/17 0540 06/06/17 0455  CALCIUM 7.5* 7.7* 7.9* 8.2*  MG 1.4* 1.7  --   --   PHOS 5.1* 4.2  --   --     CBC Recent Labs  Lab 06/04/17 0451 06/05/17 0540 06/06/17 0455  WBC 16.3* 17.3* 19.6*  HGB 11.0* 9.1* 10.0*  HCT 32.5* 26.4* 30.2*  PLT 211 198 257    Coag's No results for input(s): APTT, INR in the last 168 hours.  Sepsis Markers Recent Labs  Lab 06/03/17 0448 06/03/17 0713 06/03/17 0944 06/04/17 0451 06/04/17 1209 06/05/17 0540  LATICACIDVEN  --  1.4 4.3*  --  3.3*  --   PROCALCITON 9.42  --   --  38.78  --  26.37    ABG Recent Labs  Lab 06/03/17 1655 06/05/17 0451 06/06/17 0324  PHART 7.231* 7.398 7.306*  PCO2ART 39.8 36.8 53.3*  PO2ART 82.0* 108 87.1    Liver Enzymes Recent Labs  Lab 06/03/17 0127  AST 324*  ALT 233*   ALKPHOS 154*  BILITOT 0.8  ALBUMIN 3.2*    Cardiac Enzymes No results for input(s): TROPONINI, PROBNP in the last 168 hours.  Glucose Recent Labs  Lab 06/05/17 1156 06/05/17 1559 06/05/17 1959 06/05/17 2355 06/06/17 0414 06/06/17 0805  GLUCAP 137* 138* 166* 115* 182* 158*    Imaging ECHO 12/31 shoed an Ef=35%    CULTURES: Admission blood cultures are growing strep viridans.  ANTIBIOTICS: Vancomycin and Zosyn.  DISCUSSION: This is a 42 year old with history of coronary artery disease and diabetes who presented with altered mental status.  He on presentation has had a elevated lactate and an elevated procalcitonin.  We have isolated strep viridans in 1  out of 2 sets of blood cultures. Procalcitonin has been slow to clear. He had seizure like activity this morning and appears to be post ictal, this would fit a clinical picture in which he was found down in a puddle of stool and vomit.  ASSESSMENT / PLAN:  PULMONARY A: He has diffuse bilateral infiltrates. How mch of this is secondary to infection/ aspiration and how much CHF not clear. Continue antibiotics and lasix.     INFECTIOUS A: Only one blood culture positive for strep viridians. Repeating blood cultures. Continue broad coverage for aspiration. WBC is not reverting to normal. If procalcitonin remains elevated, will consult ID. Suspicious of pancreas as provocation for elevated WBC.    ENDOCRINE A: I have increased his long-acting insulin today.   Neurologic:  Possible seizure this am. EEG pending. Repeat head CT, check ionized calcium and magnesium  Greater than 32 minutes was spent in the care of this patient today  Lars Masson, MD Pulmonary and [place] Pager: [phone]  06/06/2017, 10:13 AM

## 2017-06-06 NOTE — Procedures (Signed)
HPI:  42 y/o with MS change and seizure like activity  TECHNICAL SUMMARY:  A multichannel referential and bipolar montage EEG using the standard international 10-20 system was performed on the patient described as sedated on fentanyl.  There is no occipital dominant rhythm.  The background activity consists of a mixture of fast and slow activity.  Much 5 Hz activity can be seen overriding in the background.  Low voltage fast activity is distributed symmetrically.  ACTIVATION:  Stepwise photic stimulation and hyperventilation are not performed.  EPILEPTIFORM ACTIVITY:  There were no spikes, sharp waves or paroxysmal activity.  SLEEP: No physiologic sleep is noted.  CARDIAC:  The EKG lead was not well recorded and was full of artifact.  IMPRESSION:  This EEG demonstrated a mixture of fast and slow activity, which is most consistent with medication effect.  There were no focal, hemispheric, or lateralizing features on this EEG.  There was no epileptiform activity recorded on this EEG.  This does not rule out the diagnosis of a seizure disorder and clinical correlation is required.

## 2017-06-07 ENCOUNTER — Inpatient Hospital Stay (HOSPITAL_COMMUNITY): Payer: Medicaid Other

## 2017-06-07 ENCOUNTER — Telehealth: Payer: Self-pay | Admitting: Family Medicine

## 2017-06-07 LAB — GLUCOSE, CAPILLARY
GLUCOSE-CAPILLARY: 177 mg/dL — AB (ref 65–99)
GLUCOSE-CAPILLARY: 254 mg/dL — AB (ref 65–99)
Glucose-Capillary: 155 mg/dL — ABNORMAL HIGH (ref 65–99)
Glucose-Capillary: 172 mg/dL — ABNORMAL HIGH (ref 65–99)
Glucose-Capillary: 169 mg/dL — ABNORMAL HIGH (ref 65–99)

## 2017-06-07 LAB — COMPREHENSIVE METABOLIC PANEL
ALT: 77 U/L — ABNORMAL HIGH (ref 17–63)
ANION GAP: 4 — AB (ref 5–15)
AST: 79 U/L — ABNORMAL HIGH (ref 15–41)
Albumin: 2 g/dL — ABNORMAL LOW (ref 3.5–5.0)
Alkaline Phosphatase: 110 U/L (ref 38–126)
BILIRUBIN TOTAL: 1.1 mg/dL (ref 0.3–1.2)
BUN: 25 mg/dL — ABNORMAL HIGH (ref 6–20)
CO2: 28 mmol/L (ref 22–32)
Calcium: 8.6 mg/dL — ABNORMAL LOW (ref 8.9–10.3)
Chloride: 104 mmol/L (ref 101–111)
Creatinine, Ser: 1.14 mg/dL (ref 0.61–1.24)
GFR calc Af Amer: >60 mL/min (ref >60)
GFR calc non Af Amer: >60 mL/min (ref >60)
GLUCOSE: 244 mg/dL — AB (ref 65–99)
POTASSIUM: 4 mmol/L (ref 3.5–5.1)
SODIUM: 136 mmol/L (ref 135–145)
TOTAL PROTEIN: 7.5 g/dL (ref 6.5–8.1)

## 2017-06-07 LAB — BLOOD GAS, ARTERIAL
Acid-Base Excess: 5.2 mmol/L — ABNORMAL HIGH (ref 0.0–2.0)
BICARBONATE: 29.5 mmol/L — AB (ref 20.0–28.0)
Drawn by: 418751
FIO2: 40
LHR: 18 {breaths}/min
O2 Saturation: 98 %
PCO2 ART: 46.3 mmHg (ref 32.0–48.0)
PEEP/CPAP: 5 cmH2O
Patient temperature: 98.6
Pressure control: 23 cmH2O
pH, Arterial: 7.421 (ref 7.350–7.450)
pO2, Arterial: 138 mmHg — ABNORMAL HIGH (ref 83.0–108.0)

## 2017-06-07 LAB — O&P RESULT

## 2017-06-07 LAB — CBC WITH DIFFERENTIAL/PLATELET
Basophils Absolute: 0.1 10*3/uL (ref 0.0–0.1)
Basophils Relative: 0 %
EOS ABS: 0.4 10*3/uL (ref 0.0–0.7)
EOS PCT: 3 %
HCT: 29.4 % — ABNORMAL LOW (ref 39.0–52.0)
Hemoglobin: 9.5 g/dL — ABNORMAL LOW (ref 13.0–17.0)
Lymphocytes Relative: 19 %
Lymphs Abs: 3 10*3/uL (ref 0.7–4.0)
MCH: 28.9 pg (ref 26.0–34.0)
MCHC: 32.3 g/dL (ref 30.0–36.0)
MCV: 89.4 fL (ref 78.0–100.0)
MONO ABS: 3 10*3/uL — AB (ref 0.1–1.0)
MONOS PCT: 19 %
Neutro Abs: 9.2 10*3/uL — ABNORMAL HIGH (ref 1.7–7.7)
Neutrophils Relative %: 59 %
PLATELETS: 252 10*3/uL (ref 150–400)
RBC: 3.29 MIL/uL — ABNORMAL LOW (ref 4.22–5.81)
RDW: 14.1 % (ref 11.5–15.5)
WBC: 15.6 10*3/uL — ABNORMAL HIGH (ref 4.0–10.5)

## 2017-06-07 LAB — PHOSPHORUS: Phosphorus: 3.4 mg/dL (ref 2.5–4.6)

## 2017-06-07 LAB — OVA + PARASITE EXAM

## 2017-06-07 LAB — MAGNESIUM: Magnesium: 1.7 mg/dL (ref 1.7–2.4)

## 2017-06-07 LAB — CALCIUM, IONIZED: CALCIUM, IONIZED, SERUM: 4.9 mg/dL (ref 4.5–5.6)

## 2017-06-07 LAB — PROCALCITONIN: Procalcitonin: 5.56 ng/mL

## 2017-06-07 MED ORDER — INSULIN GLARGINE 100 UNIT/ML ~~LOC~~ SOLN
13.0000 [IU] | Freq: Every day | SUBCUTANEOUS | Status: DC
  Administered 2017-06-07 – 2017-06-09 (×3): 13 [IU] via SUBCUTANEOUS
  Filled 2017-06-07 (×3): qty 0.13

## 2017-06-07 MED ORDER — ORAL CARE MOUTH RINSE
15.0000 mL | Freq: Two times a day (BID) | OROMUCOSAL | Status: DC
  Administered 2017-06-10: 15 mL via OROMUCOSAL

## 2017-06-07 MED ORDER — ALTEPLASE 2 MG IJ SOLR
2.0000 mg | Freq: Once | INTRAMUSCULAR | Status: AC
  Administered 2017-06-07: 2 mg

## 2017-06-07 MED ORDER — FENTANYL CITRATE (PF) 100 MCG/2ML IJ SOLN
12.5000 ug | INTRAMUSCULAR | Status: DC | PRN
  Administered 2017-06-07 – 2017-06-08 (×2): 25 ug via INTRAVENOUS
  Filled 2017-06-07 (×2): qty 2

## 2017-06-07 MED ORDER — HYDRALAZINE HCL 20 MG/ML IJ SOLN
10.0000 mg | INTRAMUSCULAR | Status: DC | PRN
  Administered 2017-06-07 – 2017-06-09 (×2): 10 mg via INTRAVENOUS
  Filled 2017-06-07 (×2): qty 1

## 2017-06-07 MED ORDER — CHLORHEXIDINE GLUCONATE 0.12 % MT SOLN
15.0000 mL | Freq: Two times a day (BID) | OROMUCOSAL | Status: DC
  Administered 2017-06-07 – 2017-06-10 (×6): 15 mL via OROMUCOSAL
  Filled 2017-06-07 (×5): qty 15

## 2017-06-07 MED ORDER — IOPAMIDOL (ISOVUE-300) INJECTION 61%
INTRAVENOUS | Status: AC
  Administered 2017-06-07: 15:00:00
  Filled 2017-06-07: qty 100

## 2017-06-07 MED ORDER — IOPAMIDOL (ISOVUE-300) INJECTION 61%
INTRAVENOUS | Status: AC
  Administered 2017-06-07: 14:00:00
  Filled 2017-06-07: qty 30

## 2017-06-07 MED ORDER — IOPAMIDOL (ISOVUE-300) INJECTION 61%
100.0000 mL | Freq: Once | INTRAVENOUS | Status: AC | PRN
  Administered 2017-06-07: 100 mL via INTRAVENOUS

## 2017-06-07 NOTE — Progress Notes (Addendum)
eLink Physician-Brief Progress Note Patient Name: Matthew Riley DOB: [birth year] MRN: [id number]   Date of Service  06/07/2017  HPI/Events of Note  Hypertension  eICU Interventions  hydralazine     Intervention Category Major Interventions: Change in mental status - evaluation and management  Max Fickle 06/07/2017, 11:16 PM

## 2017-06-07 NOTE — Procedures (Signed)
Pt transported to CT on vent, no complications  

## 2017-06-07 NOTE — Progress Notes (Signed)
RT NOTE:  RT called to room due to patient self-extubating. Pt doing well on 55% VM @ this time. Sats 95%, RR 16. Pt A&O and protecting airway. RT will monitor.

## 2017-06-07 NOTE — Telephone Encounter (Signed)
Call placed to Matthew Riley #[phone] from [facility] regarding patient's application. She informed me that application was received and assesment was scheduled but that patient missed appointment. Patient will have to call [facility] and reschedule his appointment.   Call placed to patient #[phone], to inform him that he needs to call [facility] #[phone] and reschedule his assessment appointment. No answer. Left patient a message asking him to return my call at [phone].

## 2017-06-07 NOTE — Progress Notes (Signed)
eLink Physician-Brief Progress Note Patient Name: Matthew Riley DOB: 02-Jun-1976 MRN: [id number]   Date of Service  06/07/2017  HPI/Events of Note  Self extubated, stable on camera check  eICU Interventions  D/c sedation and vent orders Write post extubation order set monitor     Intervention Category Major Interventions: Respiratory failure - evaluation and management  Max Fickle 06/07/2017, 7:35 PM

## 2017-06-07 NOTE — Procedures (Signed)
Pt failed PS trial at this time due to no pt effort.  Pt back on full support, tolerating well. RT will monitor

## 2017-06-07 NOTE — Progress Notes (Signed)
PULMONARY / CRITICAL CARE MEDICINE   Name: Matthew Riley MRN: [id number] DOB: 11-20-1975    ADMISSION DATE:  06/03/2017  CHIEF COMPLAINT:  Altered mental status  HISTORY OF PRESENT ILLNESS:   42 year old male with PMH of DM, HTN, CAD s/p stent placement, GERD, and Tobacco abuse. Admission 04/2017 for DKA. Lives with sister. Using home health services.    Presented to ED on 12/31 after being found down in bathroom unresponsive covered in emesis and diarrhea. Upon arrival to ED patient hypotensive and hypoxic, requiring intubated. LA 5.07. Given Vancomycin/Zoysn and 3L NS.  AST/ALT 324/233, Crt 2.34. PCCM asked to admit.   Blood has grown strep viridans in one bottle. Repeat cultures remain negative. He reports painful teeth. Echo 12/31 with poor windows, Ef 35%.Repeat echo 1/2 shows no vegetations and a little better Ef at 40-45%. Mixed flora on sputum gram stain.  A right upper quadrant ultrasound to evaluate his transaminitis as well as elevated amylase and lipase did not show overt obstruction.  Seizure like activity followed by a prolonged period of decreased responsiveness on 1/3. No further seizure like activity and no epileptiform activity on spot EEG. He remains intubated and mechanically ventilated. He is sedated but does nod to questions.     PAST MEDICAL HISTORY :  He  has a past medical history of Diabetes mellitus without complication ([place]), Hypertension, Renal disorder, and Stroke ([place]).  PAST SURGICAL HISTORY: He  has a past surgical history that includes Coronary angioplasty with stent.  No Known Allergies  No current facility-administered medications on file prior to encounter.    Current Outpatient Medications on File Prior to Encounter  Medication Sig  . aspirin EC 81 MG tablet Take 1 tablet (81 mg total) by mouth daily.  . cyclobenzaprine (FLEXERIL) 10 MG tablet Take 0.5 tablets (5 mg total) by mouth 3 (three) times daily as needed for muscle spasms.  .  furosemide (LASIX) 20 MG tablet Take 1 tablet (20 mg total) by mouth daily.  Marland Kitchen gabapentin (NEURONTIN) 300 MG capsule Take 1 capsule (300 mg total) by mouth 3 (three) times daily.  Marland Kitchen ibuprofen (ADVIL,MOTRIN) 800 MG tablet Take 800 mg by mouth every 8 (eight) hours as needed for moderate pain.  . indomethacin (INDOCIN) 50 MG capsule Take 1 capsule (50 mg total) by mouth 3 (three) times daily as needed for moderate pain. Take with a meal.  . insulin aspart (NOVOLOG) 100 UNIT/ML injection Inject 5 Units into the skin 3 (three) times daily with meals.  . insulin aspart protamine- aspart (NOVOLOG MIX 70/30) (70-30) 100 UNIT/ML injection Inject 0.38 mLs (38 Units total) into the skin 2 (two) times daily with a meal.  . lisinopril (PRINIVIL,ZESTRIL) 40 MG tablet Take 1 tablet (40 mg total) by mouth daily.  . methotrexate (RHEUMATREX) 2.5 MG tablet Take 3 tablets (7.5 mg total) by mouth once a week. Caution:Chemotherapy. Protect from light.  . mupirocin ointment (BACTROBAN) 2 % Apply 1 application topically 2 (two) times daily. For 7 days.  . ondansetron (ZOFRAN) 4 MG tablet Take 1 tablet (4 mg total) by mouth every 8 (eight) hours as needed for nausea.  . pantoprazole (PROTONIX) 40 MG tablet Take 1 tablet (40 mg total) by mouth daily.  . blood glucose meter kit and supplies Dispense based on patient and insurance preference. Use up to four times daily as directed. (FOR ICD-9 250.00, 250.01).  . traMADol (ULTRAM) 50 MG tablet Take 1 tablet (50 mg total) by mouth every 8 (  eight) hours as needed for severe pain.    FAMILY HISTORY:  His has no family status information on file.    SOCIAL HISTORY: He  reports that he has quit smoking. he has never used smokeless tobacco. He reports that he drinks alcohol. He reports that he does not use drugs.  REVIEW OF SYSTEMS:   Not obtainable  SUBJECTIVE:  As above  VITAL SIGNS: BP (!) 128/94   Pulse 89   Temp 99 F (37.2 C)   Resp 18   Ht 6' 1" (1.854 m)    Wt 224 lb 10.4 oz (101.9 kg)   SpO2 98%   BMI 29.64 kg/m   HEMODYNAMICS: CVP:  [11 mmHg-16 mmHg] 15 mmHg  VENTILATOR SETTINGS: Vent Mode: PCV FiO2 (%):  [30 %-40 %] 30 % Set Rate:  [18 bmp] 18 bmp PEEP:  [5 cmH20] 5 cmH20 Plateau Pressure:  [20 cmH20-23 cmH20] 20 cmH20  INTAKE / OUTPUT: I/O last 3 completed shifts: In: 2448.8 [I.V.:1208.8; NG/GT:1240] Out: 5800 [Urine:5800]  PHYSICAL EXAMINATION: General: Orally intubated and mechanically ventilated. Not responding to voice for me today. On 100 mcg Fentanyl during my exam. He is in no distress.  Cardiovascular: S1 and S2 are regular. Positive S4, no rub or murmur. Lungs: There is symmetric air movement, respirations are unlabored, there are few scattered rhonchi, and no wheezes. Abdomen: The abdomen is flat and soft without any organomegaly masses tenderness guarding or rebound. Musculoskeletal: There is 2+ dependent edema.   LABS:  BMET Recent Labs  Lab 06/05/17 0540 06/06/17 0455 06/07/17 0518  NA 131* 133* 136  K 4.7 4.4 4.0  CL 104 103 104  CO2 _0 BUN 38* 33* 25*  CREATININE 1.95* 1.62* 1.14  GLUCOSE 290* 224* 244*    Electrolytes Recent Labs  Lab 06/03/17 0448 06/04/17 0451 06/05/17 0540 06/06/17 0455 06/06/17 1046 06/07/17 0518  CALCIUM 7.5* 7.7* 7.9* 8.2*  --  8.6*  MG 1.4* 1.7  --   --  2.0 1.7  PHOS 5.1* 4.2  --   --   --  3.4    CBC Recent Labs  Lab 06/05/17 0540 06/06/17 0455 06/07/17 0518  WBC 17.3* 19.6* 15.6*  HGB 9.1* 10.0* 9.5*  HCT 26.4* 30.2* 29.4*  PLT 198 257 252    Coag's No results for input(s): APTT, INR in the last 168 hours.  Sepsis Markers Recent Labs  Lab 06/03/17 0713 06/03/17 0944  06/04/17 1209 06/05/17 0540 06/06/17 1046 06/07/17 0518  LATICACIDVEN 1.4 4.3*  --  3.3*  --   --   --   PROCALCITON  --   --    < >  --  26.37 9.40 5.56   < > = values in this interval not displayed.    ABG Recent Labs  Lab 06/05/17 0451 06/06/17 0324  06/07/17 0248  PHART 7.398 7.306* 7.421  PCO2ART 36.8 53.3* 46.3  PO2ART 108 87.1 138*    Liver Enzymes Recent Labs  Lab 06/03/17 0127 06/07/17 0518  AST 324* 79*  ALT 233* 77*  ALKPHOS 154* 110  BILITOT 0.8 1.1  ALBUMIN 3.2* 2.0*    Cardiac Enzymes No results for input(s): TROPONINI, PROBNP in the last 168 hours.  Glucose Recent Labs  Lab 06/06/17 1203 06/06/17 1523 06/06/17 1957 06/06/17 2341 06/07/17 0355 06/07/17 0752  GLUCAP 153* 124* 204* 210* 155* 177*    Imaging ECHO 12/31 shoed an Ef=35%    CULTURES: Admission blood cultures are growing strep viridans.  ANTIBIOTICS: Vancomycin and Zosyn.  DISCUSSION: This is a 42 year old with history of coronary artery disease and diabetes who presented with altered mental status.  He on presentation has had a elevated lactate and an elevated procalcitonin.  We have isolated strep viridans in 1 out of 2 sets of blood cultures. Procalcitonin has been slow to clear. He had seizure like activity once this admission, this would fit a clinical picture in which he was found down in a puddle of stool and vomit.  ASSESSMENT / PLAN:  PULMONARY A: He has diffuse bilateral infiltrates. How mch of this is secondary to infection/ aspiration and how much CHF not clear. Continue Zosyn, D/C Vanco as we have isolated no resistant gpc's. He has been weaned down to 30% and 5 PEEP, will decrease      INFECTIOUS A: Only one blood culture positive for strep viridians. Repeating blood cultures. Continue broad coverage for aspiration. WBC is slowly reverting to normal and procalcitonin dropping. Will repeat CT to R/O missed intraabdominal source of sepsis, if negative consult ID.    ENDOCRINE A: I have increased his long-acting insulin today.   Neurologic:  Possible seizure this admission. Negative  EEG.    Greater than 32 minutes was spent in the care of this patient today  Lars Masson, MD Pulmonary and [place] Pager: [phone]  06/07/2017, 11:24 AM

## 2017-06-07 NOTE — Progress Notes (Signed)
Wasted 200 mls of fentanyl with Contractor.

## 2017-06-08 LAB — CBC WITH DIFFERENTIAL/PLATELET
BASOS ABS: 0 10*3/uL (ref 0.0–0.1)
BLASTS: 0 %
Band Neutrophils: 0 %
Basophils Relative: 0 %
Eosinophils Absolute: 0.5 10*3/uL (ref 0.0–0.7)
Eosinophils Relative: 4 %
HEMATOCRIT: 31 % — AB (ref 39.0–52.0)
Hemoglobin: 10.4 g/dL — ABNORMAL LOW (ref 13.0–17.0)
Lymphocytes Relative: 13 %
Lymphs Abs: 1.7 10*3/uL (ref 0.7–4.0)
MCH: 29.9 pg (ref 26.0–34.0)
MCHC: 33.5 g/dL (ref 30.0–36.0)
MCV: 89.1 fL (ref 78.0–100.0)
METAMYELOCYTES PCT: 0 %
MONOS PCT: 19 %
MYELOCYTES: 0 %
Monocytes Absolute: 2.4 10*3/uL — ABNORMAL HIGH (ref 0.1–1.0)
NEUTROS ABS: 8.2 10*3/uL — AB (ref 1.7–7.7)
NEUTROS PCT: 64 %
Other: 0 %
Platelets: 285 10*3/uL (ref 150–400)
Promyelocytes Absolute: 0 %
RBC: 3.48 MIL/uL — ABNORMAL LOW (ref 4.22–5.81)
RDW: 14.1 % (ref 11.5–15.5)
WBC: 12.8 10*3/uL — AB (ref 4.0–10.5)
nRBC: 0 /100 WBC

## 2017-06-08 LAB — GLUCOSE, CAPILLARY
GLUCOSE-CAPILLARY: 149 mg/dL — AB (ref 65–99)
GLUCOSE-CAPILLARY: 118 mg/dL — AB (ref 65–99)
GLUCOSE-CAPILLARY: 153 mg/dL — AB (ref 65–99)
GLUCOSE-CAPILLARY: 183 mg/dL — AB (ref 65–99)
Glucose-Capillary: 209 mg/dL — ABNORMAL HIGH (ref 65–99)
Glucose-Capillary: 216 mg/dL — ABNORMAL HIGH (ref 65–99)

## 2017-06-08 LAB — BASIC METABOLIC PANEL
ANION GAP: 5 (ref 5–15)
BUN: 17 mg/dL (ref 6–20)
CO2: 32 mmol/L (ref 22–32)
Calcium: 9.3 mg/dL (ref 8.9–10.3)
Chloride: 101 mmol/L (ref 101–111)
Creatinine, Ser: 0.97 mg/dL (ref 0.61–1.24)
GFR calc Af Amer: >60 mL/min (ref >60)
GFR calc non Af Amer: >60 mL/min (ref >60)
GLUCOSE: 160 mg/dL — AB (ref 65–99)
POTASSIUM: 3.5 mmol/L (ref 3.5–5.1)
Sodium: 138 mmol/L (ref 135–145)

## 2017-06-08 LAB — CULTURE, BLOOD (ROUTINE X 2)
CULTURE: NO GROWTH
Special Requests: ADEQUATE

## 2017-06-08 LAB — MAGNESIUM: Magnesium: 1.5 mg/dL — ABNORMAL LOW (ref 1.7–2.4)

## 2017-06-08 LAB — PROCALCITONIN: PROCALCITONIN: 3.41 ng/mL

## 2017-06-08 MED ORDER — INSULIN ASPART 100 UNIT/ML ~~LOC~~ SOLN
0.0000 [IU] | Freq: Three times a day (TID) | SUBCUTANEOUS | Status: DC
  Administered 2017-06-08: 3 [IU] via SUBCUTANEOUS
  Administered 2017-06-08: 5 [IU] via SUBCUTANEOUS
  Administered 2017-06-09: 3 [IU] via SUBCUTANEOUS
  Administered 2017-06-09: 5 [IU] via SUBCUTANEOUS
  Administered 2017-06-09: 8 [IU] via SUBCUTANEOUS
  Administered 2017-06-10 (×2): 15 [IU] via SUBCUTANEOUS
  Administered 2017-06-10: 3 [IU] via SUBCUTANEOUS
  Administered 2017-06-11: 11 [IU] via SUBCUTANEOUS
  Administered 2017-06-11 – 2017-06-12 (×2): 3 [IU] via SUBCUTANEOUS
  Administered 2017-06-12: 8 [IU] via SUBCUTANEOUS

## 2017-06-08 MED ORDER — ACETAMINOPHEN 160 MG/5ML PO SOLN
500.0000 mg | ORAL | Status: DC | PRN
Start: 2017-06-08 — End: 2017-06-09

## 2017-06-08 NOTE — Progress Notes (Signed)
PULMONARY / CRITICAL CARE MEDICINE   Name: Matthew Riley MRN: [id number] DOB: 06/03/1976    ADMISSION DATE:  06/03/2017  CHIEF COMPLAINT:  Altered mental status  HISTORY OF PRESENT ILLNESS:   42 year old male with PMH of DM, HTN, CAD s/p stent placement, GERD, and Tobacco abuse.He also has RA for which he usually takes methotrexate.  Admission 04/2017 for DKA. Lives with sister. Using home health services.    Presented to ED on 12/31 after being found down in bathroom unresponsive covered in emesis and diarrhea. Upon arrival to ED patient hypotensive and hypoxic, requiring intubation. LA 5.07. Given Vancomycin/Zoysn and 3L NS.  AST/ALT 324/233, Crt 2.34. PCCM asked to admit.   Blood has grown strep viridans in one bottle. Repeat cultures remain negative. He reports painful teeth. Echo 12/31 with poor windows, Ef 35%.Repeat echo 1/2 shows no vegetations and a little better Ef at 40-45%. Mixed flora on sputum gram stain.  A right upper quadrant ultrasound to evaluate his transaminitis as well as elevated amylase and lipase did not show overt obstruction.        Seizure like activity followed by a prolonged period of decreased responsiveness on 1/3. No further seizure like activity and no epileptiform activity on spot EEG.   Self extubated last night.  He is denying any dyspnea he does report a cough productive of purulent sputum.  He also tells me he is continuing to have some epigastric discomfort and nausea with pain that intermittently radiates to the back.     PAST MEDICAL HISTORY :  He  has a past medical history of Diabetes mellitus without complication ([place]), Hypertension, Renal disorder, and Stroke ([place]).  PAST SURGICAL HISTORY: He  has a past surgical history that includes Coronary angioplasty with stent.  No Known Allergies  No current facility-administered medications on file prior to encounter.    Current Outpatient Medications on File Prior to Encounter  Medication Sig   . aspirin EC 81 MG tablet Take 1 tablet (81 mg total) by mouth daily.  . cyclobenzaprine (FLEXERIL) 10 MG tablet Take 0.5 tablets (5 mg total) by mouth 3 (three) times daily as needed for muscle spasms.  . furosemide (LASIX) 20 MG tablet Take 1 tablet (20 mg total) by mouth daily.  Marland Kitchen gabapentin (NEURONTIN) 300 MG capsule Take 1 capsule (300 mg total) by mouth 3 (three) times daily.  Marland Kitchen ibuprofen (ADVIL,MOTRIN) 800 MG tablet Take 800 mg by mouth every 8 (eight) hours as needed for moderate pain.  . indomethacin (INDOCIN) 50 MG capsule Take 1 capsule (50 mg total) by mouth 3 (three) times daily as needed for moderate pain. Take with a meal.  . insulin aspart (NOVOLOG) 100 UNIT/ML injection Inject 5 Units into the skin 3 (three) times daily with meals.  . insulin aspart protamine- aspart (NOVOLOG MIX 70/30) (70-30) 100 UNIT/ML injection Inject 0.38 mLs (38 Units total) into the skin 2 (two) times daily with a meal.  . lisinopril (PRINIVIL,ZESTRIL) 40 MG tablet Take 1 tablet (40 mg total) by mouth daily.  . methotrexate (RHEUMATREX) 2.5 MG tablet Take 3 tablets (7.5 mg total) by mouth once a week. Caution:Chemotherapy. Protect from light.  . mupirocin ointment (BACTROBAN) 2 % Apply 1 application topically 2 (two) times daily. For 7 days.  . ondansetron (ZOFRAN) 4 MG tablet Take 1 tablet (4 mg total) by mouth every 8 (eight) hours as needed for nausea.  . pantoprazole (PROTONIX) 40 MG tablet Take 1 tablet (40 mg total) by  mouth daily.  . blood glucose meter kit and supplies Dispense based on patient and insurance preference. Use up to four times daily as directed. (FOR ICD-9 250.00, 250.01).  . traMADol (ULTRAM) 50 MG tablet Take 1 tablet (50 mg total) by mouth every 8 (eight) hours as needed for severe pain.    FAMILY HISTORY:  His has no family status information on file.    SOCIAL HISTORY: He  reports that he has quit smoking. he has never used smokeless tobacco. He reports that he drinks  alcohol. He reports that he does not use drugs.  REVIEW OF SYSTEMS:   Not obtainable  SUBJECTIVE:  As above  VITAL SIGNS: BP 111/76   Pulse 96   Temp 98.4 F (36.9 C) (Axillary)   Resp (!) 26   Ht _0  (1.854 m)   Wt 211 lb 6.7 oz (95.9 kg)   SpO2 100%   BMI 27.89 kg/m   HEMODYNAMICS:    VENTILATOR SETTINGS: Vent Mode: PCV FiO2 (%):  [30 %-55 %] 55 % Set Rate:  [18 bmp] 18 bmp PEEP:  [5 cmH20] 5 cmH20 Plateau Pressure:  [20 cmH20] 20 cmH20  INTAKE / OUTPUT: I/O last 3 completed shifts: In: 3737.8 [I.V.:1235; NG/GT:2252.8; IV Piggyback:250] Out: 5150 [Urine:5150]  PHYSICAL EXAMINATION: General: Extubated and breathing comfortably.  Off sedation but still a little lethargic and very soft-spoken Cardiovascular: S1 and S2 are regular. Positive S4, no rub or murmur. Lungs: There is symmetric air movement, respirations are unlabored, there are few scattered rhonchi, and no wheezes. Abdomen: The abdomen is flat and soft without any organomegaly masses tenderness guarding or rebound.    LABS:  BMET Recent Labs  Lab 06/06/17 0455 06/07/17 0518 06/08/17 0444  NA 133* 136 138  K 4.4 4.0 3.5  CL 103 104 101  CO2 26 28 32  BUN 33* 25* 17  CREATININE 1.62* 1.14 0.97  GLUCOSE 224* 244* 160*    Electrolytes Recent Labs  Lab 06/03/17 0448 06/04/17 0451  06/06/17 0455 06/06/17 1046 06/07/17 0518 06/08/17 0444  CALCIUM 7.5* 7.7*   < > 8.2*  --  8.6* 9.3  MG 1.4* 1.7  --   --  2.0 1.7 1.5*  PHOS 5.1* 4.2  --   --   --  3.4  --    < > = values in this interval not displayed.    CBC Recent Labs  Lab 06/06/17 0455 06/07/17 0518 06/08/17 0444  WBC 19.6* 15.6* 12.8*  HGB 10.0* 9.5* 10.4*  HCT 30.2* 29.4* 31.0*  PLT 257 252 285    Coag's No results for input(s): APTT, INR in the last 168 hours.  Sepsis Markers Recent Labs  Lab 06/03/17 0713 06/03/17 0944  06/04/17 1209  06/06/17 1046 06/07/17 0518 06/08/17 0444  LATICACIDVEN 1.4 4.3*  --  3.3*   --   --   --   --   PROCALCITON  --   --    < >  --    < > 9.40 5.56 3.41   < > = values in this interval not displayed.    ABG Recent Labs  Lab 06/05/17 0451 06/06/17 0324 06/07/17 0248  PHART 7.398 7.306* 7.421  PCO2ART 36.8 53.3* 46.3  PO2ART 108 87.1 138*    Liver Enzymes Recent Labs  Lab 06/03/17 0127 06/07/17 0518  AST 324* 79*  ALT 233* 77*  ALKPHOS 154* 110  BILITOT 0.8 1.1  ALBUMIN 3.2* 2.0*    Cardiac Enzymes No  results for input(s): TROPONINI, PROBNP in the last 168 hours.  Glucose Recent Labs  Lab 06/07/17 1136 06/07/17 1654 06/07/17 1934 06/08/17 0016 06/08/17 0426 06/08/17 0804  GLUCAP 254* 172* 169* 216* 149* 118*    Imaging ECHO 12/31 shoed an Ef=35%    CULTURES: Admission blood cultures are growing strep viridans 1 bottle only  ANTIBIOTICS:  Zosyn.  DISCUSSION: This is a 42 year old with history of coronary artery disease, RA on methotrexate and diabetes who presented with altered mental status.  On presentation he had an elevated lactate and an elevated procalcitonin.  We have isolated strep viridans in 1 out of 2 sets of blood cultures. Procalcitonin has been slow to clear. He had seizure like activity once this admission, this would fit a clinical picture in which he was found down in a puddle of stool and vomit.  ASSESSMENT / PLAN:  PULMONARY A: He has diffuse bilateral infiltrates.  Continuing to treat for LV dysfunction with Lasix and will continue the Zosyn until his pro-calcitonin clears.      INFECTIOUS A: I am somewhat concerned that his pro-calcitonin and white count have been slow to clear and I am concerned that we have an infectious source other than aspiration.  Am somewhat skeptical of the diagnosis of viridans endocarditis with only 1 out of 4 blood cultures positive however I will be discussing the situation with infectious disease today.  Repeat CT for source of intra-abdominal sepsis was not  remarkable.   ENDOCRINE A: I have increased his long-acting insulin today.   Neurologic:  Possible seizure this admission. Negative  EEG.    Greater than 32 minutes was spent in the care of this patient today  Lars Masson, MD Pulmonary and [place] Pager: [phone]  06/08/2017, 8:23 AM

## 2017-06-09 DIAGNOSIS — I5021 Acute systolic (congestive) heart failure: Secondary | ICD-10-CM

## 2017-06-09 LAB — COMPREHENSIVE METABOLIC PANEL
ALK PHOS: 92 U/L (ref 38–126)
ALT: 52 U/L (ref 17–63)
AST: 58 U/L — ABNORMAL HIGH (ref 15–41)
Albumin: 1.8 g/dL — ABNORMAL LOW (ref 3.5–5.0)
Anion gap: 4 — ABNORMAL LOW (ref 5–15)
BUN: 12 mg/dL (ref 6–20)
CALCIUM: 8.2 mg/dL — AB (ref 8.9–10.3)
CO2: 31 mmol/L (ref 22–32)
Chloride: 99 mmol/L — ABNORMAL LOW (ref 101–111)
Creatinine, Ser: 0.99 mg/dL (ref 0.61–1.24)
Glucose, Bld: 188 mg/dL — ABNORMAL HIGH (ref 65–99)
Potassium: 3.1 mmol/L — ABNORMAL LOW (ref 3.5–5.1)
SODIUM: 134 mmol/L — AB (ref 135–145)
Total Bilirubin: 1 mg/dL (ref 0.3–1.2)
Total Protein: 7.3 g/dL (ref 6.5–8.1)

## 2017-06-09 LAB — CBC WITH DIFFERENTIAL/PLATELET
BASOS PCT: 0 %
Basophils Absolute: 0 10*3/uL (ref 0.0–0.1)
EOS ABS: 0.5 10*3/uL (ref 0.0–0.7)
Eosinophils Relative: 3 %
HCT: 29.6 % — ABNORMAL LOW (ref 39.0–52.0)
Hemoglobin: 9.6 g/dL — ABNORMAL LOW (ref 13.0–17.0)
Lymphocytes Relative: 32 %
Lymphs Abs: 5.3 10*3/uL — ABNORMAL HIGH (ref 0.7–4.0)
MCH: 29 pg (ref 26.0–34.0)
MCHC: 32.4 g/dL (ref 30.0–36.0)
MCV: 89.4 fL (ref 78.0–100.0)
MONO ABS: 2.6 10*3/uL — AB (ref 0.1–1.0)
Monocytes Relative: 16 %
NEUTROS PCT: 49 %
Neutro Abs: 8.1 10*3/uL — ABNORMAL HIGH (ref 1.7–7.7)
PLATELETS: 271 10*3/uL (ref 150–400)
RBC: 3.31 MIL/uL — AB (ref 4.22–5.81)
RDW: 13.7 % (ref 11.5–15.5)
WBC: 16.5 10*3/uL — AB (ref 4.0–10.5)

## 2017-06-09 LAB — GLUCOSE, CAPILLARY
GLUCOSE-CAPILLARY: 168 mg/dL — AB (ref 65–99)
GLUCOSE-CAPILLARY: 236 mg/dL — AB (ref 65–99)
GLUCOSE-CAPILLARY: 259 mg/dL — AB (ref 65–99)
Glucose-Capillary: 261 mg/dL — ABNORMAL HIGH (ref 65–99)

## 2017-06-09 MED ORDER — FUROSEMIDE 10 MG/ML IJ SOLN
40.0000 mg | Freq: Four times a day (QID) | INTRAMUSCULAR | Status: DC
  Administered 2017-06-09 – 2017-06-10 (×5): 40 mg via INTRAVENOUS
  Filled 2017-06-09 (×5): qty 4

## 2017-06-09 MED ORDER — IBUPROFEN 400 MG PO TABS
800.0000 mg | ORAL_TABLET | Freq: Four times a day (QID) | ORAL | Status: DC | PRN
  Administered 2017-06-09: 800 mg via ORAL
  Filled 2017-06-09: qty 2

## 2017-06-09 MED ORDER — MAGNESIUM SULFATE 4 GM/100ML IV SOLN
4.0000 g | Freq: Once | INTRAVENOUS | Status: AC
  Administered 2017-06-09: 4 g via INTRAVENOUS
  Filled 2017-06-09: qty 100

## 2017-06-09 MED ORDER — LACTATED RINGERS IV SOLN
INTRAVENOUS | Status: DC
  Administered 2017-06-09: 06:00:00 via INTRAVENOUS

## 2017-06-09 MED ORDER — POTASSIUM CHLORIDE CRYS ER 20 MEQ PO TBCR
20.0000 meq | EXTENDED_RELEASE_TABLET | Freq: Four times a day (QID) | ORAL | Status: DC
  Administered 2017-06-09 – 2017-06-10 (×5): 20 meq via ORAL
  Filled 2017-06-09 (×5): qty 1

## 2017-06-09 MED ORDER — ACETAMINOPHEN 500 MG PO TABS
1000.0000 mg | ORAL_TABLET | Freq: Once | ORAL | Status: AC
  Administered 2017-06-09: 1000 mg via ORAL
  Filled 2017-06-09: qty 2

## 2017-06-09 NOTE — Progress Notes (Signed)
Patient complaining of frontal headache of 10/10.  He describes as throbbing.  His blood pressure is 139/92.  MD made aware.  10 mg IV Hydralazine given.  800 mg PO Ibuprofen given.  Will continue to monitor patient.  Bernie Covey RN-BC, [employer]

## 2017-06-09 NOTE — Progress Notes (Signed)
Rehab Admissions Coordinator Note:  Patient was screened by Trish Mage for appropriateness for an Inpatient Acute Rehab Consult.  At this time, we are recommending Inpatient Rehab consult.  Lelon Frohlich M 06/09/2017, 7:00 PM  I can be reached at [phone].

## 2017-06-09 NOTE — Progress Notes (Signed)
PULMONARY / CRITICAL CARE MEDICINE   Name: Matthew Riley MRN: [id number] DOB: 1976-05-05    ADMISSION DATE:  06/03/2017  CHIEF COMPLAINT:  Altered mental status  BRIEF:   42 year old male with PMH of DM, HTN, CAD s/p stent placement, GERD, and Tobacco abuse. Admission 04/2017 for DKA. Lives with sister. Using home health services.    Presented to ED on 12/31 after being found down in bathroom unresponsive covered in emesis and diarrhea. Upon arrival to ED patient hypotensive and hypoxic, requiring intubated. LA 5.07. Given Vancomycin/Zoysn and 3L [place].  AST/ALT 324/233, Crt 2.34. PCCM asked to admit.   Blood has grown strep viridans in one bottle. Repeat cultures remain negative. He reports painful teeth. Echo 12/31 with poor windows, Ef 35%.Repeat echo 1/2 shows no vegetations and a little better Ef at 40-45%. Mixed flora on sputum gram stain.  A right upper quadrant ultrasound to evaluate his transaminitis as well as elevated amylase and lipase did not show overt obstruction.  Seizure like activity followed by a prolonged period of decreased responsiveness on 1/3. No further seizure like activity and no epileptiform activity on spot EEG. He remains intubated and mechanically ventilated. He is sedated but does nod to questions.    Imaging, studies and events 06/03/2017 - admit, ECHO 12/31 shoed an Ef=35%, Admission blood cultures are growing strep viridans. No vegetations 06/05/17 - repeat echo - ef 45% with gr 1 diast dysfn 1./5/19 - self extubated     SUBJECTIVE/OVERNIGHT/INTERVAL HX 06/09/2017 -  Extubatedd yesterday. Overnight moved from 4N to 71M tele unit. Denies complaints other than fatigue, bloating (has anasarca) . On 3L [place] (new hypoxemia since admit),  Being diuresed. Poor po intake and deconditioning per RN. On Tele with HR 90-104 sinus tach  VITAL SIGNS: BP 137/90 (BP Location: Left Arm)   Pulse 95   Temp 98.2 F (36.8 C) (Oral)   Resp 18   Ht 6\' 1"  (1.854 m)   Wt 95.9  kg (211 lb 6.7 oz)   SpO2 95%   BMI 27.89 kg/m   HEMODYNAMICS:    VENTILATOR SETTINGS:    INTAKE / OUTPUT: I/O last 3 completed shifts: In: 2168.8 [P.O.:480; I.V.:598.8; NG/GT:840; IV Piggyback:250] Out: 2900 [Urine:2900]  PHYSICAL EXAMINATION:  General Appearance:    Looks Much older than stated age. Deconditoned  Head:    Normocephalic, without obvious abnormality, atraumatic  Eyes:    PERRL - yes, conjunctiva/corneas - clear      Ears:    Normal external ear canals, both ears  Nose:   NG tube - no but has Swain  Throat:  ETT TUBE - no , OG tube - no  Neck:   Supple,  No enlargement/tenderness/nodules     Lungs:     No distress. RLL > LLL crackles  Chest wall:    No deformity  Heart:    S1 and S2 normal, no murmur, CVP - no.  Pressors - no  Abdomen:     Soft, no masses, no organomegaly  Genitalia:    Not done  Rectal:   not done  Extremities:   Extremities- 3+ edema     Skin:   Intact in exposed areas .      Neurologic:   Sedation - none -> RASS - +1 . Moves all 4s - yes. CAM-ICU - neg . Orientation - x3+    PULMONARY Recent Labs  Lab 06/03/17 2585 06/03/17 2778 06/03/17 2423 06/03/17 5361 06/03/17 1655 06/05/17 0451 06/06/17 0324  06/07/17 0248  PHART 7.173* 7.165* 7.189* 7.279* 7.231* 7.398 7.306* 7.421  PCO2ART 49.4* 47.4 53.9* 37.5 39.8 36.8 53.3* 46.3  PO2ART 46.0* 90.0 30.0* 65.0* 82.0* 108 87.1 138*  HCO3 18.5* 17.3* 20.1 17.3* 16.5* 21.9 25.8 29.5*  TCO2 20* 19* 22 18* 18*  --   --   --   O2SAT 74.0 95.0 37.0 87.0 92.0 97.2 95.7 98.0    CBC Recent Labs  Lab 06/07/17 0518 06/08/17 0444 06/09/17 0414  HGB 9.5* 10.4* 9.6*  HCT 29.4* 31.0* 29.6*  WBC 15.6* 12.8* 16.5*  PLT 252 285 271    COAGULATION No results for input(s): INR in the last 168 hours.  CARDIAC  No results for input(s): TROPONINI in the last 168 hours. No results for input(s): PROBNP in the last 168 hours.   CHEMISTRY Recent Labs  Lab 06/03/17 0448 06/04/17 0451  06/05/17 0540 06/06/17 0455 06/06/17 1046 06/07/17 0518 06/08/17 0444 06/09/17 0414  NA 134* 130* 131* 133*  --  136 138 134*  K 4.1 5.6* 4.7 4.4  --  4.0 3.5 3.1*  CL 107 104 104 103  --  104 101 99*  CO2 18* 21* 22 26  --  28 32 31  GLUCOSE 178* 200* 290* 224*  --  244* 160* 188*  BUN 34* 30* 38* 33*  --  25* 17 12  CREATININE 2.26* 1.71* 1.95* 1.62*  --  1.14 0.97 0.99  CALCIUM 7.5* 7.7* 7.9* 8.2*  --  8.6* 9.3 8.2*  MG 1.4* 1.7  --   --  2.0 1.7 1.5*  --   PHOS 5.1* 4.2  --   --   --  3.4  --   --    Estimated Creatinine Clearance: 117.4 mL/min (by C-G formula based on SCr of 0.99 mg/dL).   LIVER Recent Labs  Lab 06/03/17 0127 06/07/17 0518 06/09/17 0414  AST 324* 79* 58*  ALT 233* 77* 52  ALKPHOS 154* 110 92  BILITOT 0.8 1.1 1.0  PROT 9.4* 7.5 7.3  ALBUMIN 3.2* 2.0* 1.8*     INFECTIOUS Recent Labs  Lab 06/03/17 0713 06/03/17 0944  06/04/17 1209  06/06/17 1046 06/07/17 0518 06/08/17 0444  LATICACIDVEN 1.4 4.3*  --  3.3*  --   --   --   --   PROCALCITON  --   --    < >  --    < > 9.40 5.56 3.41   < > = values in this interval not displayed.     ENDOCRINE CBG (last 3)  Recent Labs    06/08/17 1632 06/08/17 2122 06/09/17 0751  GLUCAP 153* 183* 168*         IMAGING x48h  - image(s) personally visualized  -   highlighted in bold Ct Abdomen Pelvis W Contrast  Result Date: 06/07/2017 CLINICAL DATA:  Patient in DKA on 12/31, unable to wean off the ventilator follow up sepsis extremities edema.^157mL ISOVUE-300 IOPAMIDOL (ISOVUE-300) INJECTION 61% EXAM: CT ABDOMEN AND PELVIS WITH CONTRAST TECHNIQUE: Multidetector CT imaging of the abdomen and pelvis was performed using the standard protocol following bolus administration of intravenous contrast. CONTRAST:  ISOVUE-300 IOPAMIDOL (ISOVUE-300) INJECTION 61% COMPARISON:  CT chest, abdomen and pelvis dated 06/03/2017. FINDINGS: Lower chest: Persistent perihilar and bibasilar consolidations, perhaps  mildly improved compared to the earlier chest CT. Hepatobiliary: No focal liver abnormality is seen. No gallstones, gallbladder wall thickening, or biliary dilatation. Pancreas: Unremarkable. No pancreatic ductal dilatation or surrounding inflammatory changes. Spleen: Normal  in size without focal abnormality. Adrenals/Urinary Tract: Adrenal glands appear normal. Kidneys are unremarkable without mass, stone or hydronephrosis. Bladder is decompressed by Foley catheter. Stomach/Bowel: No dilated large or small bowel loops. NG tube in place. Rectal tube in place. Vascular/Lymphatic: No significant vascular findings are present. No enlarged abdominal or pelvic lymph nodes. Reproductive: Prostate is unremarkable. Other: Mild fluid stranding within the bilateral pericolic gutters and lower pelvis. No circumscribed fluid collection or abscess like collection identified. No free intraperitoneal air seen. Musculoskeletal: No acute or suspicious osseous finding. Ill-defined fluid/edema within the subcutaneous soft tissues indicating anasarca. IMPRESSION: 1. Anasarca within the superficial soft tissues. Mild fluid stranding within the paracolic gutters and lower pelvis. No circumscribed fluid collection or abscess like collection identified. 2. Persistent perihilar and bibasilar consolidations, incompletely imaged, perhaps mildly improved compared to the chest CT of 06/03/2017. 3. Remainder of the abdomen and pelvis CT is unremarkable. No bowel obstruction. No evidence of acute solid organ abnormality. Electronically Signed   By: Bary Richard M.D.   On: 06/07/2017 16:39        DISCUSSION: This is a 42 year old with history of coronary artery disease and diabetes who presented with altered mental status.  He on presentation has had a elevated lactate and an elevated procalcitonin.  We have isolated strep viridans in 1 out of 2 sets of blood cultures. Procalcitonin has been slow to clear. He had seizure like activity  once this admission, this would fit a clinical picture in which he was found down in a puddle of stool and vomit.  ASSESSMENT / PLAN:  PULMONARY A:Acute hypoxemic resp failure - s/p VDRF - cause infiltrates due to aspiration/infection v acute systolic CHF v some combo  06/09/2017 - Needing 3L [place] without distress  PLAN  - o2 for pusle ox > 92% - IS  - diurese   CARDIAC A - new dx acute s-CHF P - lasix aggressive - might needs cards consult 06/10/17 beyond ; trh to decide - continue tele - check TROP 06/10/17   RENAL A: Low MAg and volume overload P  Replete mag Diurese with kcl supplment   GI A: Bloating and anasarca - likely due to edema  P Monitor with lasix    INFECTIOUS Results for orders placed or performed during the hospital encounter of 06/03/17  C difficile quick scan w PCR reflex     Status: None   Collection Time: 06/03/17  3:24 AM  Result Value Ref Range Status   C Diff antigen NEGATIVE NEGATIVE Final   C Diff toxin NEGATIVE NEGATIVE Final   C Diff interpretation No C. difficile detected.  Final  Blood culture (routine x 2)     Status: None   Collection Time: 06/03/17  4:02 AM  Result Value Ref Range Status   Specimen Description BLOOD RIGHT WRIST  Final   Special Requests IN PEDIATRIC BOTTLE Blood Culture adequate volume  Final   Culture NO GROWTH 5 DAYS  Final   Report Status 06/08/2017 FINAL  Final  Blood culture (routine x 2)     Status: Abnormal   Collection Time: 06/03/17  4:48 AM  Result Value Ref Range Status   Specimen Description BLOOD RIGHT HAND  Final   Special Requests IN PEDIATRIC BOTTLE Blood Culture adequate volume  Final   Culture  Setup Time   Final    GRAM POSITIVE COCCI IN CHAINS IN PEDIATRIC BOTTLE CRITICAL RESULT CALLED TO, READ BACK BY AND VERIFIED WITH: J. Carney Pharm.D. 20:50  06/03/17 ([email or web address])    Culture (A)  Final    VIRIDANS STREPTOCOCCUS THE SIGNIFICANCE OF ISOLATING THIS ORGANISM FROM A SINGLE SET OF BLOOD  CULTURES WHEN MULTIPLE SETS ARE DRAWN IS UNCERTAIN. PLEASE NOTIFY THE MICROBIOLOGY DEPARTMENT WITHIN ONE WEEK IF SPECIATION AND SENSITIVITIES ARE REQUIRED.    Report Status 06/04/2017 FINAL  Final  Blood Culture ID Panel (Reflexed)     Status: Abnormal   Collection Time: 06/03/17  4:48 AM  Result Value Ref Range Status   Enterococcus species NOT DETECTED NOT DETECTED Final   Listeria monocytogenes NOT DETECTED NOT DETECTED Final   Staphylococcus species NOT DETECTED NOT DETECTED Final   Staphylococcus aureus NOT DETECTED NOT DETECTED Final   Streptococcus species DETECTED (A) NOT DETECTED Final    Comment: Not Enterococcus species, Streptococcus agalactiae, Streptococcus pyogenes, or Streptococcus pneumoniae. CRITICAL RESULT CALLED TO, READ BACK BY AND VERIFIED WITH: J. Carney Pharm.D. 20:50 06/03/17 ([email or web address])    Streptococcus agalactiae NOT DETECTED NOT DETECTED Final   Streptococcus pneumoniae NOT DETECTED NOT DETECTED Final   Streptococcus pyogenes NOT DETECTED NOT DETECTED Final   Acinetobacter baumannii NOT DETECTED NOT DETECTED Final   Enterobacteriaceae species NOT DETECTED NOT DETECTED Final   Enterobacter cloacae complex NOT DETECTED NOT DETECTED Final   Escherichia coli NOT DETECTED NOT DETECTED Final   Klebsiella oxytoca NOT DETECTED NOT DETECTED Final   Klebsiella pneumoniae NOT DETECTED NOT DETECTED Final   Proteus species NOT DETECTED NOT DETECTED Final   Serratia marcescens NOT DETECTED NOT DETECTED Final   Carbapenem resistance NOT DETECTED NOT DETECTED Final   Haemophilus influenzae NOT DETECTED NOT DETECTED Final   Neisseria meningitidis NOT DETECTED NOT DETECTED Final   Pseudomonas aeruginosa NOT DETECTED NOT DETECTED Final   Candida albicans NOT DETECTED NOT DETECTED Final   Candida glabrata NOT DETECTED NOT DETECTED Final   Candida krusei NOT DETECTED NOT DETECTED Final   Candida parapsilosis NOT DETECTED NOT DETECTED Final   Candida tropicalis NOT DETECTED NOT  DETECTED Final  Gastrointestinal Panel by PCR , Stool     Status: None   Collection Time: 06/03/17  5:14 AM  Result Value Ref Range Status   Campylobacter species NOT DETECTED NOT DETECTED Final   Plesimonas shigelloides NOT DETECTED NOT DETECTED Final   Salmonella species NOT DETECTED NOT DETECTED Final   Yersinia enterocolitica NOT DETECTED NOT DETECTED Final   Vibrio species NOT DETECTED NOT DETECTED Final   Vibrio cholerae NOT DETECTED NOT DETECTED Final   Enteroaggregative E coli (EAEC) NOT DETECTED NOT DETECTED Final   Enteropathogenic E coli (EPEC) NOT DETECTED NOT DETECTED Final   Enterotoxigenic E coli (ETEC) NOT DETECTED NOT DETECTED Final   Shiga like toxin producing E coli (STEC) NOT DETECTED NOT DETECTED Final   Shigella/Enteroinvasive E coli (EIEC) NOT DETECTED NOT DETECTED Final   Cryptosporidium NOT DETECTED NOT DETECTED Final   Cyclospora cayetanensis NOT DETECTED NOT DETECTED Final   Entamoeba histolytica NOT DETECTED NOT DETECTED Final   Giardia lamblia NOT DETECTED NOT DETECTED Final   Adenovirus F40/41 NOT DETECTED NOT DETECTED Final   Astrovirus NOT DETECTED NOT DETECTED Final   Norovirus GI/GII NOT DETECTED NOT DETECTED Final   Rotavirus A NOT DETECTED NOT DETECTED Final   Sapovirus (I, II, IV, and V) NOT DETECTED NOT DETECTED Final    Comment: Performed at [facility], [address] [place], [place] [address]  OVA + PARASITE EXAM     Status: None   Collection Time: 06/03/17  5:14 AM  Result Value Ref Range Status   OVA + PARASITE EXAM Final report  Final    Comment: (NOTE) These results were obtained using wet preparation(s) and trichrome stained smear. This test does not include testing for Cryptosporidium parvum, Cyclospora, or Microsporidia. Performed At: [facility] [address] [place], [place] [id number] Jolene Schimke MD SA:[id number]    Source of Sample STOOL  Final  MRSA PCR Screening     Status: None   Collection Time:  06/03/17  6:26 AM  Result Value Ref Range Status   MRSA by PCR NEGATIVE NEGATIVE Final    Comment:        The GeneXpert MRSA Assay (FDA approved for NASAL specimens only), is one component of a comprehensive MRSA colonization surveillance program. It is not intended to diagnose MRSA infection nor to guide or monitor treatment for MRSA infections.   Respiratory Panel by PCR     Status: None   Collection Time: 06/03/17  3:14 PM  Result Value Ref Range Status   Adenovirus NOT DETECTED NOT DETECTED Final   Coronavirus 229E NOT DETECTED NOT DETECTED Final   Coronavirus HKU1 NOT DETECTED NOT DETECTED Final   Coronavirus NL63 NOT DETECTED NOT DETECTED Final   Coronavirus OC43 NOT DETECTED NOT DETECTED Final   Metapneumovirus NOT DETECTED NOT DETECTED Final   Rhinovirus / Enterovirus NOT DETECTED NOT DETECTED Final   Influenza A NOT DETECTED NOT DETECTED Final   Influenza B NOT DETECTED NOT DETECTED Final   Parainfluenza Virus 1 NOT DETECTED NOT DETECTED Final   Parainfluenza Virus 2 NOT DETECTED NOT DETECTED Final   Parainfluenza Virus 3 NOT DETECTED NOT DETECTED Final   Parainfluenza Virus 4 NOT DETECTED NOT DETECTED Final   Respiratory Syncytial Virus NOT DETECTED NOT DETECTED Final   Bordetella pertussis NOT DETECTED NOT DETECTED Final   Chlamydophila pneumoniae NOT DETECTED NOT DETECTED Final   Mycoplasma pneumoniae NOT DETECTED NOT DETECTED Final  Culture, respiratory (NON-Expectorated)     Status: None   Collection Time: 06/03/17  9:37 PM  Result Value Ref Range Status   Specimen Description TRACHEAL ASPIRATE  Final   Special Requests NONE  Final   Gram Stain   Final    RARE WBC PRESENT, PREDOMINANTLY PMN FEW GRAM POSITIVE COCCI IN PAIRS RARE GRAM NEGATIVE RODS RARE GRAM POSITIVE RODS    Culture Consistent with normal respiratory flora.  Final   Report Status 06/06/2017 FINAL  Final  Culture, blood (routine x 2)     Status: None (Preliminary result)   Collection Time:  06/05/17  9:31 AM  Result Value Ref Range Status   Specimen Description BLOOD LEFT ARM  Final   Special Requests   Final    BOTTLES DRAWN AEROBIC ONLY Blood Culture adequate volume   Culture NO GROWTH 3 DAYS  Final   Report Status PENDING  Incomplete  Culture, blood (routine x 2)     Status: None (Preliminary result)   Collection Time: 06/05/17  9:38 AM  Result Value Ref Range Status   Specimen Description BLOOD LEFT FOREARM  Final   Special Requests   Final    BOTTLES DRAWN AEROBIC ONLY Blood Culture adequate volume   Culture NO GROWTH 3 DAYS  Final   Report Status PENDING  Incomplete     A: Only one blood culture positive for strep viridians.   06/09/2017 - PCT trending down. CT abd without evidence of collection  P Await Repeatin blood cultures.  Continue zosyn for now Might need  ID consult; TRH to decide    Anti-infectives (From admission, onward)   Start     Dose/Rate Route Frequency Ordered Stop   06/04/17 0300  vancomycin (VANCOCIN) 1,250 mg in sodium chloride 0.9 % 250 mL IVPB  Status:  Discontinued     1,250 mg 166.7 mL/hr over 90 Minutes Intravenous Every 24 hours 06/03/17 0530 06/07/17 1115   06/03/17 1200  piperacillin-tazobactam (ZOSYN) IVPB 3.375 g     3.375 g 12.5 mL/hr over 240 Minutes Intravenous Every 8 hours 06/03/17 0530     06/03/17 0300  piperacillin-tazobactam (ZOSYN) IVPB 3.375 g     3.375 g 100 mL/hr over 30 Minutes Intravenous  Once 06/03/17 0248 06/03/17 0407   06/03/17 0300  vancomycin (VANCOCIN) 1,500 mg in sodium chloride 0.9 % 500 mL IVPB     1,500 mg 250 mL/hr over 120 Minutes Intravenous  Once 06/03/17 0257 06/03/17 0535         ENDOCRINE A: DM 1 P SSI   NEUROLOGY  Possible seizure this admission. Negative  EEG.    06/09/2017 - normal mental status  P Monitor  GLOBAL  A: very decontioned P  - PT consult   Give to TRH for 06/10/17 and ccm off. D/w Dr Sharon Seller     Dr. Kalman Shan, M.D., [facility].C.P Pulmonary and  Critical Care Medicine Staff Physician, [facility] Director - Interstitial Lung Disease  Program  Pulmonary Fibrosis [facility] at [facility] [place], [place], [address]  Pager: [phone], If no answer or between  15:00h - 7:00h: call [phone] Telephone: [phone]

## 2017-06-09 NOTE — Evaluation (Addendum)
Physical Therapy Evaluation Patient Details Name: Matthew Riley MRN: [id number] DOB: 01-Jan-1976 Today's Date: 06/09/2017   History of Present Illness  42 year old male with PMH of DM, HTN, CAD s/p stent placement, GERD, and Tobacco abuse. Admission 04/2017 for DKA. Presented to ED on 12/31 after being found down in bathroom unresponsive covered in emesis and diarrhea. Upon arrival to ED patient hypotensive and hypoxic, requiring intubated.     Clinical Impression  Pt admitted with above diagnosis. Pt currently with functional limitations due to the deficits listed below (see PT Problem List). On eval, pt required mod assist bed mobility, mod assist transfers and mod assist ambulation 5 feet with RW. He demonstrates deficits in strength and balance. PTA pt resides with his sister in a 2-story apartment. He lives upstairs and needs to manage 1 flight of stairs. He has a RW on the 1st floor and a second RW upstairs. Pt will benefit from skilled PT to increase their independence and safety with mobility to allow discharge to the venue listed below.       Follow Up Recommendations CIR    Equipment Recommendations  None recommended by PT    Recommendations for Other Services Rehab consult;OT consult;Speech consult     Precautions / Restrictions Precautions Precautions: Fall      Mobility  Bed Mobility Overal bed mobility: Needs Assistance Bed Mobility: Supine to Sit;Sit to Supine     Supine to sit: Mod assist Sit to supine: Mod assist   General bed mobility comments: +rail, verbal cues for sequencing  Transfers Overall transfer level: Needs assistance Equipment used: Rolling walker (2 wheeled) Transfers: Sit to/from Stand Sit to Stand: Mod assist         General transfer comment: multi attempts to power up, verbal cues for hand placement  Ambulation/Gait Ambulation/Gait assistance: Mod assist Ambulation Distance (Feet): 5 Feet Assistive device: Rolling walker (2  wheeled) Gait Pattern/deviations: Step-through pattern;Decreased stride length Gait velocity: decreased Gait velocity interpretation: Below normal speed for age/gender General Gait Details: unsteady, assist to advance RW  Stairs            Wheelchair Mobility    Modified Rankin (Stroke Patients Only)       Balance Overall balance assessment: Needs assistance Sitting-balance support: Single extremity supported;Feet supported Sitting balance-Leahy Scale: Fair     Standing balance support: Bilateral upper extremity supported;During functional activity Standing balance-Leahy Scale: Poor                               Pertinent Vitals/Pain Pain Assessment: No/denies pain    Home Living Family/patient expects to be discharged to:: Private residence Living Arrangements: Other relatives(sister) Available Help at Discharge: Family;Available PRN/intermittently Type of Home: House Home Access: Level entry     Home Layout: Two level Home Equipment: Walker - 2 wheels      Prior Function Level of Independence: Independent         Comments: independent prior to 04/2017 admission. Pt has been utilizing RW following that admission and receiving HHPT.      Hand Dominance        Extremity/Trunk Assessment   Upper Extremity Assessment Upper Extremity Assessment: Generalized weakness    Lower Extremity Assessment Lower Extremity Assessment: Generalized weakness    Cervical / Trunk Assessment Cervical / Trunk Assessment: Normal  Communication   Communication: No difficulties  Cognition Arousal/Alertness: Awake/alert Behavior During Therapy: Flat affect Overall Cognitive Status: Within  Functional Limits for tasks assessed                                        General Comments      Exercises     Assessment/Plan    PT Assessment Patient needs continued PT services  PT Problem List Decreased strength;Decreased  mobility;Decreased activity tolerance;Decreased balance       PT Treatment Interventions Therapeutic activities;Gait training;Therapeutic exercise;Patient/family education;Stair training;Balance training;Functional mobility training    PT Goals (Current goals can be found in the Care Plan section)  Acute Rehab PT Goals Patient Stated Goal: get better PT Goal Formulation: With patient Time For Goal Achievement: 06/23/17 Potential to Achieve Goals: Good    Frequency Min 3X/week   Barriers to discharge        Co-evaluation               AM-PAC PT "6 Clicks" Daily Activity  Outcome Measure Difficulty turning over in bed (including adjusting bedclothes, sheets and blankets)?: A Lot Difficulty moving from lying on back to sitting on the side of the bed? : Unable Difficulty sitting down on and standing up from a chair with arms (e.g., wheelchair, bedside commode, etc,.)?: Unable Help needed moving to and from a bed to chair (including a wheelchair)?: A Lot Help needed walking in hospital room?: A Lot Help needed climbing 3-5 steps with a railing? : Total 6 Click Score: 9    End of Session Equipment Utilized During Treatment: Gait belt Activity Tolerance: Patient tolerated treatment well Patient left: in bed;with call bell/phone within reach Nurse Communication: Mobility status PT Visit Diagnosis: Unsteadiness on feet (R26.81);Muscle weakness (generalized) (M62.81);Other abnormalities of gait and mobility (R26.89)    Time: 4235-3614 PT Time Calculation (min) (ACUTE ONLY): 20 min   Charges:   PT Evaluation $PT Eval Moderate Complexity: 1 Mod     PT G Codes:        Aida Raider, PT  Office # [phone] Pager [phone]   Ilda Foil 06/09/2017, 3:31 PM

## 2017-06-09 NOTE — Progress Notes (Signed)
Pharmacy Antibiotic Note  Matthew Riley is a 42 y.o. male admitted on 06/03/2017 with loss of consciousness and suspected sepsis. Patient grew up 1/2 blood cultures with strep viridans, repeat blood cultures are negative. Normally 1/2 strep viridans could be a contaminant; however I did note that the patient has leukocytosis and had a fever in the past 24 hours Tm/24h 101.1. WBC 12.8>16.5.  Plan: Continue Zosyn 3.375 g IV q8h Monitor for changes in renal function, clinical picture, and culture data F/u length of therapy    Height: 6\' 1"  (185.4 cm) Weight: 211 lb 6.7 oz (95.9 kg) IBW/kg (Calculated) : 79.9  Temp (24hrs), Avg:98.6 F (37 C), Min:97.8 F (36.6 C), Max:100.1 F (37.8 C)  Recent Labs  Lab 06/03/17 0139  06/03/17 0713 06/03/17 0944  06/04/17 1209 06/05/17 0540 06/06/17 0455 06/07/17 0518 06/08/17 0444 06/09/17 0414  WBC  --   --   --   --    < >  --  17.3* 19.6* 15.6* 12.8* 16.5*  CREATININE  --    < >  --   --    < >  --  1.95* 1.62* 1.14 0.97 0.99  LATICACIDVEN 5.07*  --  1.4 4.3*  --  3.3*  --   --   --   --   --    < > = values in this interval not displayed.    Estimated Creatinine Clearance: 117.4 mL/min (by C-G formula based on SCr of 0.99 mg/dL).     Vanc 12/31>>1/5 Zosyn 12/31>>   12/31 CDiff/GI panel - NEG 12/31 Hep B+ 12/31 flu: neg 12/31 TA: normal flora 12/31 GI panel: neg 12/31 resp panel: neg 12/31 BCx: Strep virdans (1/2) 1/2 BCx: ngtd   Matthew Riley A Matthew Riley  06/09/2017 9:58 AM

## 2017-06-10 LAB — BASIC METABOLIC PANEL
Anion gap: 9 (ref 5–15)
BUN: 15 mg/dL (ref 6–20)
CALCIUM: 8.3 mg/dL — AB (ref 8.9–10.3)
CO2: 29 mmol/L (ref 22–32)
CREATININE: 1.08 mg/dL (ref 0.61–1.24)
Chloride: 93 mmol/L — ABNORMAL LOW (ref 101–111)
GFR calc non Af Amer: >60 mL/min (ref >60)
Glucose, Bld: 186 mg/dL — ABNORMAL HIGH (ref 65–99)
Potassium: 3.3 mmol/L — ABNORMAL LOW (ref 3.5–5.1)
SODIUM: 131 mmol/L — AB (ref 135–145)

## 2017-06-10 LAB — CBC WITH DIFFERENTIAL/PLATELET
BASOS PCT: 1 %
Basophils Absolute: 0.2 10*3/uL — ABNORMAL HIGH (ref 0.0–0.1)
EOS PCT: 3 %
Eosinophils Absolute: 0.5 10*3/uL (ref 0.0–0.7)
HEMATOCRIT: 29.7 % — AB (ref 39.0–52.0)
Hemoglobin: 10 g/dL — ABNORMAL LOW (ref 13.0–17.0)
LYMPHS PCT: 29 %
Lymphs Abs: 4.7 10*3/uL — ABNORMAL HIGH (ref 0.7–4.0)
MCH: 29.8 pg (ref 26.0–34.0)
MCHC: 33.7 g/dL (ref 30.0–36.0)
MCV: 88.4 fL (ref 78.0–100.0)
MONO ABS: 1.8 10*3/uL — AB (ref 0.1–1.0)
Monocytes Relative: 11 %
NEUTROS PCT: 56 %
Neutro Abs: 9 10*3/uL — ABNORMAL HIGH (ref 1.7–7.7)
Platelets: 297 10*3/uL (ref 150–400)
RBC: 3.36 MIL/uL — ABNORMAL LOW (ref 4.22–5.81)
RDW: 13.6 % (ref 11.5–15.5)
WBC: 16.2 10*3/uL — ABNORMAL HIGH (ref 4.0–10.5)

## 2017-06-10 LAB — CULTURE, BLOOD (ROUTINE X 2)
Culture: NO GROWTH
Culture: NO GROWTH
SPECIAL REQUESTS: ADEQUATE
Special Requests: ADEQUATE

## 2017-06-10 LAB — GLUCOSE, CAPILLARY
GLUCOSE-CAPILLARY: 391 mg/dL — AB (ref 65–99)
GLUCOSE-CAPILLARY: 151 mg/dL — AB (ref 65–99)
Glucose-Capillary: 411 mg/dL — ABNORMAL HIGH (ref 65–99)
Glucose-Capillary: 158 mg/dL — ABNORMAL HIGH (ref 65–99)

## 2017-06-10 LAB — MAGNESIUM: Magnesium: 1.5 mg/dL — ABNORMAL LOW (ref 1.7–2.4)

## 2017-06-10 LAB — TROPONIN I

## 2017-06-10 LAB — PHOSPHORUS: Phosphorus: 2.9 mg/dL (ref 2.5–4.6)

## 2017-06-10 LAB — PATHOLOGIST SMEAR REVIEW

## 2017-06-10 MED ORDER — MAGNESIUM SULFATE 2 GM/50ML IV SOLN
2.0000 g | Freq: Once | INTRAVENOUS | Status: AC
  Administered 2017-06-10: 2 g via INTRAVENOUS
  Filled 2017-06-10: qty 50

## 2017-06-10 MED ORDER — INSULIN GLARGINE 100 UNIT/ML ~~LOC~~ SOLN
20.0000 [IU] | Freq: Every day | SUBCUTANEOUS | Status: DC
  Administered 2017-06-10: 20 [IU] via SUBCUTANEOUS
  Filled 2017-06-10: qty 0.2

## 2017-06-10 MED ORDER — CEFTRIAXONE SODIUM IN DEXTROSE 40 MG/ML IV SOLN
2.0000 g | INTRAVENOUS | Status: DC
Start: 2017-06-10 — End: 2017-06-12
  Administered 2017-06-10 – 2017-06-11 (×2): 2 g via INTRAVENOUS
  Filled 2017-06-10: qty 50
  Filled 2017-06-10 (×2): qty 20

## 2017-06-10 MED ORDER — FUROSEMIDE 20 MG PO TABS
20.0000 mg | ORAL_TABLET | Freq: Every day | ORAL | Status: DC
  Administered 2017-06-11: 20 mg via ORAL
  Filled 2017-06-10: qty 1

## 2017-06-10 MED ORDER — SODIUM CHLORIDE 0.9 % IV BOLUS (SEPSIS)
500.0000 mL | Freq: Once | INTRAVENOUS | Status: AC
  Administered 2017-06-10: 500 mL via INTRAVENOUS

## 2017-06-10 MED ORDER — VITAMIN B-1 100 MG PO TABS
100.0000 mg | ORAL_TABLET | Freq: Every day | ORAL | Status: DC
  Administered 2017-06-11 – 2017-06-12 (×2): 100 mg via ORAL
  Filled 2017-06-10 (×2): qty 1

## 2017-06-10 MED ORDER — INSULIN ASPART 100 UNIT/ML ~~LOC~~ SOLN
5.0000 [IU] | Freq: Three times a day (TID) | SUBCUTANEOUS | Status: DC
  Administered 2017-06-10 – 2017-06-12 (×5): 5 [IU] via SUBCUTANEOUS

## 2017-06-10 MED ORDER — POTASSIUM CHLORIDE CRYS ER 20 MEQ PO TBCR
40.0000 meq | EXTENDED_RELEASE_TABLET | Freq: Once | ORAL | Status: AC
  Administered 2017-06-10: 40 meq via ORAL
  Filled 2017-06-10: qty 2

## 2017-06-10 MED ORDER — TRAMADOL HCL 50 MG PO TABS
50.0000 mg | ORAL_TABLET | Freq: Three times a day (TID) | ORAL | Status: DC | PRN
  Administered 2017-06-10 – 2017-06-12 (×4): 50 mg via ORAL
  Filled 2017-06-10 (×4): qty 1

## 2017-06-10 NOTE — Progress Notes (Signed)
Physical Therapy Treatment Patient Details Name: Matthew Riley MRN: [id number] DOB: March 25, 1976 Today's Date: 06/10/2017    History of Present Illness 42 year old male with PMH of DM, HTN, CAD s/p stent placement, GERD, and Tobacco abuse. Admission 04/2017 for DKA. Presented to ED on 12/31 after being found down in bathroom unresponsive covered in emesis and diarrhea. Upon arrival to ED patient hypotensive and hypoxic, requiring intubated.     PT Comments    Patient seen for activity progression. Patient motivated and engaged during session. Tolerated EOB activity, functional task performance, hygiene/pericare, and in room ambulation. Patient continues to require increased physical assist for mobility but does show some improvements in LLE function and activity tolerance. Will continue to see and progress as tolerated. CIR remains appropriate.   Follow Up Recommendations  CIR     Equipment Recommendations  None recommended by PT    Recommendations for Other Services Rehab consult;OT consult;Speech consult     Precautions / Restrictions Precautions Precautions: Fall Restrictions Weight Bearing Restrictions: No    Mobility  Bed Mobility Overal bed mobility: Needs Assistance Bed Mobility: Supine to Sit     Supine to sit: Min assist     General bed mobility comments: Min assist to elevate trunk to upright and rotate to EOB  Transfers Overall transfer level: Needs assistance Equipment used: Rolling walker (2 wheeled) Transfers: Sit to/from Stand Sit to Stand: Min assist         General transfer comment: Performed x4 during session  Ambulation/Gait Ambulation/Gait assistance: Mod assist Ambulation Distance (Feet): 14 Feet Assistive device: Rolling walker (2 wheeled) Gait Pattern/deviations: Step-through pattern;Decreased stride length Gait velocity: decreased Gait velocity interpretation: Below normal speed for age/gender General Gait Details: VCs for upright posture,  cues for weight shift. Facilitation of stride with moderate assist for stability with use of RW   Stairs            Wheelchair Mobility    Modified Rankin (Stroke Patients Only)       Balance Overall balance assessment: Needs assistance Sitting-balance support: Single extremity supported;Feet supported Sitting balance-Leahy Scale: Fair     Standing balance support: Bilateral upper extremity supported;During functional activity Standing balance-Leahy Scale: Poor                              Cognition Arousal/Alertness: Awake/alert Behavior During Therapy: Flat affect Overall Cognitive Status: Within Functional Limits for tasks assessed                                        Exercises      General Comments General comments (skin integrity, edema, etc.): hygiene and pericare performed during session, educated patient on bowel regimin to assist with incontinence      Pertinent Vitals/Pain Pain Assessment: Faces Faces Pain Scale: Hurts little more Pain Location: buttocks Pain Descriptors / Indicators: Sore Pain Intervention(s): Monitored during session    Home Living                      Prior Function            PT Goals (current goals can now be found in the care plan section) Acute Rehab PT Goals Patient Stated Goal: get better PT Goal Formulation: With patient Time For Goal Achievement: 06/23/17 Potential to Achieve Goals: Good Progress towards  PT goals: Progressing toward goals    Frequency    Min 3X/week      PT Plan      Co-evaluation              AM-PAC PT "6 Clicks" Daily Activity  Outcome Measure  Difficulty turning over in bed (including adjusting bedclothes, sheets and blankets)?: A Little Difficulty moving from lying on back to sitting on the side of the bed? : Unable Difficulty sitting down on and standing up from a chair with arms (e.g., wheelchair, bedside commode, etc,.)?:  Unable Help needed moving to and from a bed to chair (including a wheelchair)?: A Little Help needed walking in hospital room?: A Lot Help needed climbing 3-5 steps with a railing? : Total 6 Click Score: 11    End of Session Equipment Utilized During Treatment: Gait belt Activity Tolerance: Patient tolerated treatment well Patient left: in chair;with call bell/phone within reach Nurse Communication: Mobility status PT Visit Diagnosis: Unsteadiness on feet (R26.81);Muscle weakness (generalized) (M62.81);Other abnormalities of gait and mobility (R26.89)     Time: 0300-9233 PT Time Calculation (min) (ACUTE ONLY): 17 min  Charges:  $Therapeutic Activity: 8-22 mins                    G Codes:       Charlotte Crumb, PT DPT  Board Certified Neurologic Specialist [phone]    Fabio Asa 06/10/2017, 2:51 PM

## 2017-06-10 NOTE — Progress Notes (Signed)
12:00 CBG 411. Dr. Sunnie Nielsen notified and ordered 500 mL NS bolus and to give 15U of Novolog now. Meal coverage also ordered to start with dinner. Will continue to monitor.

## 2017-06-10 NOTE — Consult Note (Signed)
Physical Medicine and Rehabilitation Consult   Reason for Consult: Debility.  Referring Physician: Dr. Tyrell Antonio.    HPI: Matthew Riley is a 42 y.o. male with history of HTN, CKD stage III, chronic LBP, T1DM-- uncontrolled, CVA 02/2017 ( moved to [place] to be with family), CAD s/p stenting with DKA 04/2017 who was admitted on 06/03/17 after found unresponsive at home with evidence of aspiration. UDS positive for THC and benzos.  Patient with reports of N/V with diarrhea and was intubated due to decreased LOC and treated for sepsis with pressors and broad spectrum antibiotics.  CT chest/abdomen showed bilateral PNA.   One blood culture positive for strep viridans. CT abdomen repeated showing anasarca but no abscess or abnormality.  CT head done due to encephalopathy and negative for acute changes. He self extubated on tolerated extubation on 11/5 and PT evaluation showed debility. CIR recommended for follow up therapy.    Review of Systems  Constitutional: Negative for chills and fever.  HENT: Negative for hearing loss and tinnitus.   Eyes: Negative for blurred vision and double vision.  Respiratory: Positive for cough and sputum production. Negative for hemoptysis.   Cardiovascular: Negative for chest pain.  Gastrointestinal: Positive for abdominal pain. Negative for constipation, heartburn and nausea.  Genitourinary: Positive for dysuria.  Musculoskeletal: Positive for back pain. Negative for falls and myalgias.  Skin: Negative for itching and rash.  Neurological: Positive for sensory change (pain bilateral hands), weakness and headaches. Negative for dizziness.  Psychiatric/Behavioral: Positive for memory loss. The patient does not have insomnia.       Past Medical History:  Diagnosis Date  . Diabetes mellitus without complication ([place])   . Hypertension   . Renal disorder   . Stroke [facility])     Past Surgical History:  Procedure Laterality Date  . CORONARY ANGIOPLASTY WITH  STENT PLACEMENT      Family History  Family history unknown: Yes    Social History:  Single. 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 he shares an apartment with sister (works 12 hours day). Independent with walker PTA. He  reports that he has quit smoking--months abo. he has never used smokeless tobacco. He denies alcohol or marijuana.     Allergies: No Known Allergies    Facility-Administered Medications Prior to Admission  Medication Dose Route Frequency Provider Last Rate Last Dose  . sodium chloride 0.9 % bolus 1,000 mL  1,000 mL Intravenous Once Alfonse Spruce, FNP       Medications Prior to Admission  Medication Sig Dispense Refill  . aspirin EC 81 MG tablet Take 1 tablet (81 mg total) by mouth daily. 30 tablet 11  . cyclobenzaprine (FLEXERIL) 10 MG tablet Take 0.5 tablets (5 mg total) by mouth 3 (three) times daily as needed for muscle spasms. 30 tablet 1  . furosemide (LASIX) 20 MG tablet Take 1 tablet (20 mg total) by mouth daily. 30 tablet 0  . gabapentin (NEURONTIN) 300 MG capsule Take 1 capsule (300 mg total) by mouth 3 (three) times daily. 90 capsule 2  . ibuprofen (ADVIL,MOTRIN) 800 MG tablet Take 800 mg by mouth every 8 (eight) hours as needed for moderate pain.  0  . indomethacin (INDOCIN) 50 MG capsule Take 1 capsule (50 mg total) by mouth 3 (three) times daily as needed for moderate pain. Take with a meal. 60 capsule 1  . insulin  aspart (NOVOLOG) 100 UNIT/ML injection Inject 5 Units into the skin 3 (three) times daily with meals. 10 mL 3  . insulin aspart protamine- aspart (NOVOLOG MIX 70/30) (70-30) 100 UNIT/ML injection Inject 0.38 mLs (38 Units total) into the skin 2 (two) times daily with a meal. 30 mL 8  . lisinopril (PRINIVIL,ZESTRIL) 40 MG tablet Take 1 tablet (40 mg total) by mouth daily. 30 tablet 2  . methotrexate (RHEUMATREX) 2.5 MG tablet Take 3 tablets (7.5 mg  total) by mouth once a week. Caution:Chemotherapy. Protect from light. 24 tablet 0  . mupirocin ointment (BACTROBAN) 2 % Apply 1 application topically 2 (two) times daily. For 7 days. 22 g 0  . ondansetron (ZOFRAN) 4 MG tablet Take 1 tablet (4 mg total) by mouth every 8 (eight) hours as needed for nausea. 20 tablet 0  . pantoprazole (PROTONIX) 40 MG tablet Take 1 tablet (40 mg total) by mouth daily. 30 tablet 1  . blood glucose meter kit and supplies Dispense based on patient and insurance preference. Use up to four times daily as directed. (FOR ICD-9 250.00, 250.01). 1 each 0  . traMADol (ULTRAM) 50 MG tablet Take 1 tablet (50 mg total) by mouth every 8 (eight) hours as needed for severe pain. 30 tablet 0    Home: Home Living Family/patient expects to be discharged to:: Private residence Living Arrangements: Other relatives(sister) Available Help at Discharge: Family, Available PRN/intermittently Type of Home: House Home Access: Level entry Home Layout: Two level Alternate Level Stairs-Number of Steps: 16 Alternate Level Stairs-Rails: Left Bathroom Shower/Tub: Chiropodist: Standard Home Equipment: Walker - 2 wheels  Functional History: Prior Function Level of Independence: Independent Comments: independent prior to 04/2017 admission. Pt has been utilizing RW following that admission and receiving HHPT.  Functional Status:  Mobility: Bed Mobility Overal bed mobility: Needs Assistance Bed Mobility: Supine to Sit, Sit to Supine Supine to sit: Mod assist Sit to supine: Mod assist General bed mobility comments: +rail, verbal cues for sequencing Transfers Overall transfer level: Needs assistance Equipment used: Rolling walker (2 wheeled) Transfers: Sit to/from Stand Sit to Stand: Mod assist General transfer comment: multi attempts to power up, verbal cues for hand placement Ambulation/Gait Ambulation/Gait assistance: Mod assist Ambulation Distance (Feet): 5  Feet Assistive device: Rolling walker (2 wheeled) Gait Pattern/deviations: Step-through pattern, Decreased stride length General Gait Details: unsteady, assist to advance RW Gait velocity: decreased Gait velocity interpretation: Below normal speed for age/gender    ADL:    Cognition: Cognition Overall Cognitive Status: Within Functional Limits for tasks assessed Orientation Level: Oriented to person, Oriented to place, Disoriented to time, Disoriented to situation Cognition Arousal/Alertness: Awake/alert Behavior During Therapy: Flat affect Overall Cognitive Status: Within Functional Limits for tasks assessed  Blood pressure (!) 153/104, pulse 91, temperature 97.8 F (36.6 C), resp. rate 18, height _0  (1.854 m), weight 95 kg (209 lb 7 oz), SpO2 100 %. Physical Exam  Nursing note reviewed. Constitutional: He is oriented to person, place, and time. He appears well-developed and well-nourished. No distress.  HENT:  Head: Normocephalic and atraumatic.  Mouth/Throat: Oropharynx is clear and moist.  Eyes: Conjunctivae are normal. Pupils are equal, round, and reactive to light.  Neck: Normal range of motion. Neck supple.  Cardiovascular: Normal rate and regular rhythm.  Respiratory: Effort normal and breath sounds normal. No stridor. No respiratory distress. He has no wheezes.  GI: Soft. Bowel sounds are normal. He exhibits no distension.  Musculoskeletal: He exhibits edema.  Multiple  scabs bilateral shins in different phases of healing. Foam dressing right heel.   Neurological: He is alert and oriented to person, place, and time.  Patient moves all 4 extremities.  Strength grossly 3+ to 4 out of 5 bilateral upper limbs and 3-4- bilateral lower extremities.  Senses pain and light touch in both limbs.  Skin: Skin is warm and dry. He is not diaphoretic.  Abrasion right ankle/heel  Psychiatric: His affect is blunt. He is slowed.    Results for orders placed or performed during the  hospital encounter of 06/03/17 (from the past 24 hour(s))  Glucose, capillary     Status: Abnormal   Collection Time: 06/09/17 12:02 PM  Result Value Ref Range   Glucose-Capillary 261 (H) 65 - 99 mg/dL  Glucose, capillary     Status: Abnormal   Collection Time: 06/09/17  4:57 PM  Result Value Ref Range   Glucose-Capillary 236 (H) 65 - 99 mg/dL  Glucose, capillary     Status: Abnormal   Collection Time: 06/09/17  9:03 PM  Result Value Ref Range   Glucose-Capillary 259 (H) 65 - 99 mg/dL  CBC with Differential/Platelet     Status: Abnormal   Collection Time: 06/10/17 12:23 AM  Result Value Ref Range   WBC 16.2 (H) 4.0 - 10.5 K/uL   RBC 3.36 (L) 4.22 - 5.81 MIL/uL   Hemoglobin 10.0 (L) 13.0 - 17.0 g/dL   HCT 29.7 (L) 39.0 - 52.0 %   MCV 88.4 78.0 - 100.0 fL   MCH 29.8 26.0 - 34.0 pg   MCHC 33.7 30.0 - 36.0 g/dL   RDW 13.6 11.5 - 15.5 %   Platelets 297 150 - 400 K/uL   Neutrophils Relative % 56 %   Lymphocytes Relative 29 %   Monocytes Relative 11 %   Eosinophils Relative 3 %   Basophils Relative 1 %   Neutro Abs 9.0 (H) 1.7 - 7.7 K/uL   Lymphs Abs 4.7 (H) 0.7 - 4.0 K/uL   Monocytes Absolute 1.8 (H) 0.1 - 1.0 K/uL   Eosinophils Absolute 0.5 0.0 - 0.7 K/uL   Basophils Absolute 0.2 (H) 0.0 - 0.1 K/uL   RBC Morphology POLYCHROMASIA PRESENT    WBC Morphology ATYPICAL LYMPHOCYTES   Magnesium     Status: Abnormal   Collection Time: 06/10/17 12:23 AM  Result Value Ref Range   Magnesium 1.5 (L) 1.7 - 2.4 mg/dL  Phosphorus     Status: None   Collection Time: 06/10/17 12:23 AM  Result Value Ref Range   Phosphorus 2.9 2.5 - 4.6 mg/dL  Troponin I     Status: None   Collection Time: 06/10/17 12:23 AM  Result Value Ref Range   Troponin I <0.03 <0.03 ng/mL  Glucose, capillary     Status: Abnormal   Collection Time: 06/10/17  7:37 AM  Result Value Ref Range   Glucose-Capillary 391 (H) 65 - 99 mg/dL   No results found.  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 Does the need for close, 24 hr/day medical supervision in concert with the patient's rehab needs make it unreasonable for this patient to be served in a less intensive setting? Yes 2. Co-Morbidities requiring supervision/potential complications: Diabetes, coronary artery disease, hypertension, numerous wounds 3. Due to bladder management, bowel management, safety, skin/wound care, disease management, medication administration, pain management and patient education, does the patient require 24 hr/day rehab nursing? Yes 4. Does the patient require coordinated care of a physician, rehab nurse,  PT (1-2 hrs/day, 5 days/week), OT (1-2 hrs/day, 5 days/week) and SLP (1-2 hrs/day, 5 days/week) to address physical and functional deficits in the context of the above medical diagnosis(es)? Yes Addressing deficits in the following areas: balance, endurance, locomotion, strength, transferring, bowel/bladder control, bathing, dressing, feeding, grooming, toileting, cognition and psychosocial support 5. Can the patient actively participate in an intensive therapy program of at least 3 hrs of therapy per day at least 5 days per week? Yes 6. The potential for patient to make measurable gains while on inpatient rehab is excellent 7. Anticipated functional outcomes upon discharge from inpatient rehab are modified independent  with PT, modified independent with OT, modified independent with SLP. 8. Estimated rehab length of stay to reach the above functional goals is: 11-14 days 9. Anticipated D/C setting: Home 10. Anticipated post D/C treatments: HH therapy and Outpatient therapy 11. Overall Rehab/Functional Prognosis: excellent  RECOMMENDATIONS: This patient's condition is appropriate for continued rehabilitative care in the following setting: CIR Patient has agreed to participate in recommended program. Yes Note that insurance prior authorization may be required for reimbursement for recommended  care.  Comment: Rehab Admissions Coordinator to follow up.  Thanks,  Meredith Staggers, MD, Mellody Drown    Bary Leriche, PA-C 06/10/2017

## 2017-06-10 NOTE — Progress Notes (Signed)
[facility]  Patient Status: Active (receiving services up to time of hospitalization)  [facility] is providing the following services: RN, PT and MSW  If patient discharges after hours, please call [phone].   Matthew Riley 06/10/2017, 10:40 AM

## 2017-06-10 NOTE — Progress Notes (Signed)
PROGRESS NOTE    Matthew Riley  OHF:[id number] DOB: 1976-02-04 DOA: 06/03/2017 PCP: Lizbeth Bark, FNP    Brief Narrative: 42 year old male with PMH of DM, HTN, CAD s/p stent placement, GERD, and Tobacco abuse. Admission 04/2017 for DKA. Lives with sister. Using home health services.  Presented to ED on 12/31 after being found down in bathroom unresponsive covered in emesis and diarrhea. Upon arrival to ED patient hypotensive and hypoxic, requiring intubated. LA 5.07. Given Vancomycin/Zoysn and 3L NS. AST/ALT 324/233, Crt 2.34. PCCM asked to admit.   Blood has grown strep viridans in one bottle. Repeat cultures remain negative. . Echo 12/31 with poor windows, Ef 35%. Repeat echo 1/2 shows no vegetations and a little better Ef at 40-45%. Mixed flora on sputum gram stain.  A right upper quadrant ultrasound to evaluate his transaminitis as well as elevated amylase and lipase did not show overt obstruction.  Seizure like activity followed by a prolonged period of decreased responsiveness on 1/3. No further seizure like activity and no epileptiform activity on spot EEG. Patient self  extubated 1-05   Assessment & Plan:   Active Problems:   Encephalopathy   Acute respiratory failure with hypoxia (HCC)   Septic shock (HCC)  1-Strep viridans Bacteremia, one of two; PNA aspiration.  Repeat chest x ray tomorrow.  Day 8 zosyn. Change to ceftriaxone for 2 more days.  Repeated blood culture negative.  Leukocytosis; on IV antibiotics. Follow trend.   2-Acute hypoxemic resp failure - s/p VDRF - cause infiltrates due to aspiration/infection v acute systolic CHF v some combo IV antibiotics, narrow to ceftriaxone.  Change IV lasix to oral./  Repeat chest x ray. Oxygen sat 95 RA.  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  Initial ECHO lower EF Troponin normal.  History CAD Stent.  Needs follow up with cardiology.   4-Hypomagnesemia; replete IV.  Hypokalemia ; replete  orally.   5-DM; increase lantus. Add meal coverage.   Transaminases; related to infection. Improving , trending down.   Acute encephalopathy;  Related to acute illness.  Improved.   Sepsis; PNA, aspiration,  treated with IV antibiotics, pressors.  Resolved.   Ulcer to Left Lower Extremity > wound care consult.   Questionable Seizure activity while intubated 12-31;  CT head. negative EEG; negative for seizure.  Monitor for further seizure.   Diarrhea; c diff negative.   RA; resume tramadol. Resume methotrexate when infection resolved.    DVT prophylaxis: heparin.  Code Status: full code.  Family Communication: care discussed with patient  Disposition Plan: CIR    Consultants:   CCM admitted patient    Procedures:  06/03/2017 - admit, ECHO 12/31 shoed an Ef=35%, Admission blood cultures are growing strep viridans. No vegetations 06/05/17 - repeat echo - ef 45% with gr 1 diast dysfn 1./5/19 - self extubated      Antimicrobials:   Zosyn 12-31   Subjective:   Objective: Vitals:   06/10/17 0101 06/10/17 0550 06/10/17 0849 06/10/17 1548  BP:  (!) 153/104 (!) 140/94 131/78  Pulse:  91 87 92  Resp:  18 18 18   Temp:  97.8 F (36.6 C) 98.4 F (36.9 C) 98.5 F (36.9 C)  TempSrc:   Oral Oral  SpO2:  100% 100% 95%  Weight: 95 kg (209 lb 7 oz)     Height:        Intake/Output Summary (Last 24 hours) at 06/10/2017 1550 Last data filed at 06/10/2017 1516  Gross per 24 hour  Intake 1260 ml  Output 6326 ml  Net -5066 ml   Filed Weights   06/09/17 0500 06/09/17 2112 06/10/17 0101  Weight: 95.9 kg (211 lb 6.7 oz) 95 kg (209 lb 7 oz) 95 kg (209 lb 7 oz)    Examination:  General exam: Appears calm and comfortable  Respiratory system: bilateral ronchus. Marland Kitchen Respiratory effort normal. Cardiovascular system: S1 & S2 heard, RRR. No JVD, murmurs, rubs, gallops or clicks. No pedal edema. Gastrointestinal system: Abdomen is nondistended, soft and nontender. No  organomegaly or masses felt. Normal bowel sounds heard. Central nervous system: Alert and oriented.  Extremities: Symmetric 5 x 5 power.     Data Reviewed: I have personally reviewed following labs and imaging studies  CBC: Recent Labs  Lab 06/06/17 0455 06/07/17 0518 06/08/17 0444 06/09/17 0414 06/10/17 0023  WBC 19.6* 15.6* 12.8* 16.5* 16.2*  NEUTROABS 14.7* 9.2* 8.2* 8.1* 9.0*  HGB 10.0* 9.5* 10.4* 9.6* 10.0*  HCT 30.2* 29.4* 31.0* 29.6* 29.7*  MCV 89.3 89.4 89.1 89.4 88.4  PLT 257 252 285 271 297   Basic Metabolic Panel: Recent Labs  Lab 06/04/17 0451  06/06/17 0455 06/06/17 1046 06/07/17 0518 06/08/17 0444 06/09/17 0414 06/10/17 0023 06/10/17 1342  NA 130*   < > 133*  --  136 138 134*  --  131*  K 5.6*   < > 4.4  --  4.0 3.5 3.1*  --  3.3*  CL 104   < > 103  --  104 101 99*  --  93*  CO2 21*   < > 26  --  28 32 31  --  29  GLUCOSE 200*   < > 224*  --  244* 160* 188*  --  186*  BUN 30*   < > 33*  --  25* 17 12  --  15  CREATININE 1.71*   < > 1.62*  --  1.14 0.97 0.99  --  1.08  CALCIUM 7.7*   < > 8.2*  --  8.6* 9.3 8.2*  --  8.3*  MG 1.7  --   --  2.0 1.7 1.5*  --  1.5*  --   PHOS 4.2  --   --   --  3.4  --   --  2.9  --    < > = values in this interval not displayed.   GFR: Estimated Creatinine Clearance: 99.7 mL/min (by C-G formula based on SCr of 1.08 mg/dL). Liver Function Tests: Recent Labs  Lab 06/07/17 0518 06/09/17 0414  AST 79* 58*  ALT 77* 52  ALKPHOS 110 92  BILITOT 1.1 1.0  PROT 7.5 7.3  ALBUMIN 2.0* 1.8*   No results for input(s): LIPASE, AMYLASE in the last 168 hours. No results for input(s): AMMONIA in the last 168 hours. Coagulation Profile: No results for input(s): INR, PROTIME in the last 168 hours. Cardiac Enzymes: Recent Labs  Lab 06/10/17 0023  TROPONINI <0.03   BNP (last 3 results) No results for input(s): PROBNP in the last 8760 hours. HbA1C: No results for input(s): HGBA1C in the last 72 hours. CBG: Recent Labs    Lab 06/09/17 1202 06/09/17 1657 06/09/17 2103 06/10/17 0737 06/10/17 1145  GLUCAP 261* 236* 259* 391* 411*   Lipid Profile: No results for input(s): CHOL, HDL, LDLCALC, TRIG, CHOLHDL, LDLDIRECT in the last 72 hours. Thyroid Function Tests: No results for input(s): TSH, T4TOTAL, FREET4, T3FREE, THYROIDAB in the last 72 hours. Anemia Panel:  No results for input(s): VITAMINB12, FOLATE, FERRITIN, TIBC, IRON, RETICCTPCT in the last 72 hours. Sepsis Labs: Recent Labs  Lab 06/04/17 1209 06/05/17 0540 06/06/17 1046 06/07/17 0518 06/08/17 0444  PROCALCITON  --  26.37 9.40 5.56 3.41  LATICACIDVEN 3.3*  --   --   --   --     Recent Results (from the past 240 hour(s))  C difficile quick scan w PCR reflex     Status: None   Collection Time: 06/03/17  3:24 AM  Result Value Ref Range Status   C Diff antigen NEGATIVE NEGATIVE Final   C Diff toxin NEGATIVE NEGATIVE Final   C Diff interpretation No C. difficile detected.  Final  Blood culture (routine x 2)     Status: None   Collection Time: 06/03/17  4:02 AM  Result Value Ref Range Status   Specimen Description BLOOD RIGHT WRIST  Final   Special Requests IN PEDIATRIC BOTTLE Blood Culture adequate volume  Final   Culture NO GROWTH 5 DAYS  Final   Report Status 06/08/2017 FINAL  Final  Blood culture (routine x 2)     Status: Abnormal   Collection Time: 06/03/17  4:48 AM  Result Value Ref Range Status   Specimen Description BLOOD RIGHT HAND  Final   Special Requests IN PEDIATRIC BOTTLE Blood Culture adequate volume  Final   Culture  Setup Time   Final    GRAM POSITIVE COCCI IN CHAINS IN PEDIATRIC BOTTLE CRITICAL RESULT CALLED TO, READ BACK BY AND VERIFIED WITH: J. Carney Pharm.D. 20:50 06/03/17 ([email or web address])    Culture (A)  Final    VIRIDANS STREPTOCOCCUS THE SIGNIFICANCE OF ISOLATING THIS ORGANISM FROM A SINGLE SET OF BLOOD CULTURES WHEN MULTIPLE SETS ARE DRAWN IS UNCERTAIN. PLEASE NOTIFY THE MICROBIOLOGY DEPARTMENT WITHIN ONE WEEK IF  SPECIATION AND SENSITIVITIES ARE REQUIRED.    Report Status 06/04/2017 FINAL  Final  Blood Culture ID Panel (Reflexed)     Status: Abnormal   Collection Time: 06/03/17  4:48 AM  Result Value Ref Range Status   Enterococcus species NOT DETECTED NOT DETECTED Final   Listeria monocytogenes NOT DETECTED NOT DETECTED Final   Staphylococcus species NOT DETECTED NOT DETECTED Final   Staphylococcus aureus NOT DETECTED NOT DETECTED Final   Streptococcus species DETECTED (A) NOT DETECTED Final    Comment: Not Enterococcus species, Streptococcus agalactiae, Streptococcus pyogenes, or Streptococcus pneumoniae. CRITICAL RESULT CALLED TO, READ BACK BY AND VERIFIED WITH: J. Carney Pharm.D. 20:50 06/03/17 ([email or web address])    Streptococcus agalactiae NOT DETECTED NOT DETECTED Final   Streptococcus pneumoniae NOT DETECTED NOT DETECTED Final   Streptococcus pyogenes NOT DETECTED NOT DETECTED Final   Acinetobacter baumannii NOT DETECTED NOT DETECTED Final   Enterobacteriaceae species NOT DETECTED NOT DETECTED Final   Enterobacter cloacae complex NOT DETECTED NOT DETECTED Final   Escherichia coli NOT DETECTED NOT DETECTED Final   Klebsiella oxytoca NOT DETECTED NOT DETECTED Final   Klebsiella pneumoniae NOT DETECTED NOT DETECTED Final   Proteus species NOT DETECTED NOT DETECTED Final   Serratia marcescens NOT DETECTED NOT DETECTED Final   Carbapenem resistance NOT DETECTED NOT DETECTED Final   Haemophilus influenzae NOT DETECTED NOT DETECTED Final   Neisseria meningitidis NOT DETECTED NOT DETECTED Final   Pseudomonas aeruginosa NOT DETECTED NOT DETECTED Final   Candida albicans NOT DETECTED NOT DETECTED Final   Candida glabrata NOT DETECTED NOT DETECTED Final   Candida krusei NOT DETECTED NOT DETECTED Final   Candida parapsilosis NOT DETECTED NOT DETECTED Final  Candida tropicalis NOT DETECTED NOT DETECTED Final  Gastrointestinal Panel by PCR , Stool     Status: None   Collection Time: 06/03/17  5:14 AM    Result Value Ref Range Status   Campylobacter species NOT DETECTED NOT DETECTED Final   Plesimonas shigelloides NOT DETECTED NOT DETECTED Final   Salmonella species NOT DETECTED NOT DETECTED Final   Yersinia enterocolitica NOT DETECTED NOT DETECTED Final   Vibrio species NOT DETECTED NOT DETECTED Final   Vibrio cholerae NOT DETECTED NOT DETECTED Final   Enteroaggregative E coli (EAEC) NOT DETECTED NOT DETECTED Final   Enteropathogenic E coli (EPEC) NOT DETECTED NOT DETECTED Final   Enterotoxigenic E coli (ETEC) NOT DETECTED NOT DETECTED Final   Shiga like toxin producing E coli (STEC) NOT DETECTED NOT DETECTED Final   Shigella/Enteroinvasive E coli (EIEC) NOT DETECTED NOT DETECTED Final   Cryptosporidium NOT DETECTED NOT DETECTED Final   Cyclospora cayetanensis NOT DETECTED NOT DETECTED Final   Entamoeba histolytica NOT DETECTED NOT DETECTED Final   Giardia lamblia NOT DETECTED NOT DETECTED Final   Adenovirus F40/41 NOT DETECTED NOT DETECTED Final   Astrovirus NOT DETECTED NOT DETECTED Final   Norovirus GI/GII NOT DETECTED NOT DETECTED Final   Rotavirus A NOT DETECTED NOT DETECTED Final   Sapovirus (I, II, IV, and V) NOT DETECTED NOT DETECTED Final    Comment: Performed at [facility], [address] [place], [place] [address]  OVA + PARASITE EXAM     Status: None   Collection Time: 06/03/17  5:14 AM  Result Value Ref Range Status   OVA + PARASITE EXAM Final report  Final    Comment: (NOTE) These results were obtained using wet preparation(s) and trichrome stained smear. This test does not include testing for Cryptosporidium parvum, Cyclospora, or Microsporidia. Performed At: [facility] [address] [place], [place] [id number] Jolene Schimke MD XB:[id number]    Source of Sample STOOL  Final  MRSA PCR Screening     Status: None   Collection Time: 06/03/17  6:26 AM  Result Value Ref Range Status   MRSA by PCR NEGATIVE NEGATIVE Final    Comment:         The GeneXpert MRSA Assay (FDA approved for NASAL specimens only), is one component of a comprehensive MRSA colonization surveillance program. It is not intended to diagnose MRSA infection nor to guide or monitor treatment for MRSA infections.   Respiratory Panel by PCR     Status: None   Collection Time: 06/03/17  3:14 PM  Result Value Ref Range Status   Adenovirus NOT DETECTED NOT DETECTED Final   Coronavirus 229E NOT DETECTED NOT DETECTED Final   Coronavirus HKU1 NOT DETECTED NOT DETECTED Final   Coronavirus NL63 NOT DETECTED NOT DETECTED Final   Coronavirus OC43 NOT DETECTED NOT DETECTED Final   Metapneumovirus NOT DETECTED NOT DETECTED Final   Rhinovirus / Enterovirus NOT DETECTED NOT DETECTED Final   Influenza A NOT DETECTED NOT DETECTED Final   Influenza B NOT DETECTED NOT DETECTED Final   Parainfluenza Virus 1 NOT DETECTED NOT DETECTED Final   Parainfluenza Virus 2 NOT DETECTED NOT DETECTED Final   Parainfluenza Virus 3 NOT DETECTED NOT DETECTED Final   Parainfluenza Virus 4 NOT DETECTED NOT DETECTED Final   Respiratory Syncytial Virus NOT DETECTED NOT DETECTED Final   Bordetella pertussis NOT DETECTED NOT DETECTED Final   Chlamydophila pneumoniae NOT DETECTED NOT DETECTED Final   Mycoplasma pneumoniae NOT DETECTED NOT DETECTED Final  Culture,  respiratory (NON-Expectorated)     Status: None   Collection Time: 06/03/17  9:37 PM  Result Value Ref Range Status   Specimen Description TRACHEAL ASPIRATE  Final   Special Requests NONE  Final   Gram Stain   Final    RARE WBC PRESENT, PREDOMINANTLY PMN FEW GRAM POSITIVE COCCI IN PAIRS RARE GRAM NEGATIVE RODS RARE GRAM POSITIVE RODS    Culture Consistent with normal respiratory flora.  Final   Report Status 06/06/2017 FINAL  Final  Culture, blood (routine x 2)     Status: None   Collection Time: 06/05/17  9:31 AM  Result Value Ref Range Status   Specimen Description BLOOD LEFT ARM  Final   Special Requests   Final     BOTTLES DRAWN AEROBIC ONLY Blood Culture adequate volume   Culture NO GROWTH 5 DAYS  Final   Report Status 06/10/2017 FINAL  Final  Culture, blood (routine x 2)     Status: None   Collection Time: 06/05/17  9:38 AM  Result Value Ref Range Status   Specimen Description BLOOD LEFT FOREARM  Final   Special Requests   Final    BOTTLES DRAWN AEROBIC ONLY Blood Culture adequate volume   Culture NO GROWTH 5 DAYS  Final   Report Status 06/10/2017 FINAL  Final         Radiology Studies: No results found.      Scheduled Meds: . aspirin  81 mg Per Tube Daily  . feeding supplement (PRO-STAT SUGAR FREE 64)  30 mL Per Tube BID  . [START ON 06/11/2017] furosemide  20 mg Oral Daily  . heparin  5,000 Units Subcutaneous Q8H  . insulin aspart  0-15 Units Subcutaneous TID WC  . insulin aspart  5 Units Subcutaneous TID WC  . insulin glargine  20 Units Subcutaneous QHS  . potassium chloride  40 mEq Oral Once  . [START ON 06/11/2017] thiamine  100 mg Oral Daily   Continuous Infusions: . sodium chloride    . piperacillin-tazobactam (ZOSYN)  IV 3.375 g (06/10/17 1224)     LOS: 7 days    Time spent: 35 minutes.     Alba Cory, MD [facility] Pager [phone]  If 7PM-7AM, please contact night-coverage [email or web address] Password [email or web address] 06/10/2017, 3:50 PM

## 2017-06-11 ENCOUNTER — Inpatient Hospital Stay (HOSPITAL_COMMUNITY): Payer: Medicaid Other

## 2017-06-11 LAB — PHOSPHORUS: Phosphorus: 2.6 mg/dL (ref 2.5–4.6)

## 2017-06-11 LAB — BASIC METABOLIC PANEL
Anion gap: 11 (ref 5–15)
BUN: 17 mg/dL (ref 6–20)
CO2: 25 mmol/L (ref 22–32)
CREATININE: 1.04 mg/dL (ref 0.61–1.24)
Calcium: 8 mg/dL — ABNORMAL LOW (ref 8.9–10.3)
Chloride: 94 mmol/L — ABNORMAL LOW (ref 101–111)
GFR calc Af Amer: >60 mL/min (ref >60)
GLUCOSE: 328 mg/dL — AB (ref 65–99)
POTASSIUM: 3.6 mmol/L (ref 3.5–5.1)
Sodium: 130 mmol/L — ABNORMAL LOW (ref 135–145)

## 2017-06-11 LAB — CBC WITH DIFFERENTIAL/PLATELET
BASOS ABS: 0 10*3/uL (ref 0.0–0.1)
Basophils Relative: 0 %
EOS ABS: 0.6 10*3/uL (ref 0.0–0.7)
Eosinophils Relative: 3 %
HCT: 29.5 % — ABNORMAL LOW (ref 39.0–52.0)
Hemoglobin: 9.9 g/dL — ABNORMAL LOW (ref 13.0–17.0)
LYMPHS ABS: 5.1 10*3/uL — AB (ref 0.7–4.0)
Lymphocytes Relative: 27 %
MCH: 29.2 pg (ref 26.0–34.0)
MCHC: 33.6 g/dL (ref 30.0–36.0)
MCV: 87 fL (ref 78.0–100.0)
MONO ABS: 1.7 10*3/uL — AB (ref 0.1–1.0)
Monocytes Relative: 9 %
NEUTROS PCT: 61 %
Neutro Abs: 11.5 10*3/uL — ABNORMAL HIGH (ref 1.7–7.7)
PLATELETS: 343 10*3/uL (ref 150–400)
RBC: 3.39 MIL/uL — AB (ref 4.22–5.81)
RDW: 13.3 % (ref 11.5–15.5)
WBC: 18.9 10*3/uL — AB (ref 4.0–10.5)

## 2017-06-11 LAB — URINALYSIS, ROUTINE W REFLEX MICROSCOPIC
BACTERIA UA: NONE SEEN
Bilirubin Urine: NEGATIVE
Glucose, UA: >=500 mg/dL — AB
KETONES UR: NEGATIVE mg/dL
LEUKOCYTES UA: NEGATIVE
Nitrite: NEGATIVE
PROTEIN: 100 mg/dL — AB
Specific Gravity, Urine: 1.017 (ref 1.005–1.030)
pH: 7 (ref 5.0–8.0)

## 2017-06-11 LAB — GLUCOSE, CAPILLARY
GLUCOSE-CAPILLARY: 86 mg/dL (ref 65–99)
GLUCOSE-CAPILLARY: 293 mg/dL — AB (ref 65–99)
Glucose-Capillary: 324 mg/dL — ABNORMAL HIGH (ref 65–99)
Glucose-Capillary: 193 mg/dL — ABNORMAL HIGH (ref 65–99)

## 2017-06-11 LAB — MAGNESIUM: MAGNESIUM: 1.8 mg/dL (ref 1.7–2.4)

## 2017-06-11 MED ORDER — DOXYCYCLINE HYCLATE 100 MG PO TABS
100.0000 mg | ORAL_TABLET | Freq: Two times a day (BID) | ORAL | Status: DC
  Administered 2017-06-11 – 2017-06-12 (×3): 100 mg via ORAL
  Filled 2017-06-11 (×3): qty 1

## 2017-06-11 MED ORDER — INSULIN GLARGINE 100 UNIT/ML ~~LOC~~ SOLN: 27.0000 [IU] | Freq: Every day | SUBCUTANEOUS | Status: DC

## 2017-06-11 MED ORDER — INSULIN GLARGINE 100 UNIT/ML ~~LOC~~ SOLN
30.0000 [IU] | Freq: Every day | SUBCUTANEOUS | Status: DC
  Administered 2017-06-11: 30 [IU] via SUBCUTANEOUS
  Filled 2017-06-11 (×2): qty 0.3

## 2017-06-11 MED ORDER — GUAIFENESIN ER 600 MG PO TB12
600.0000 mg | ORAL_TABLET | Freq: Two times a day (BID) | ORAL | Status: DC
  Administered 2017-06-11 – 2017-06-12 (×3): 600 mg via ORAL
  Filled 2017-06-11 (×3): qty 1

## 2017-06-11 MED ORDER — COLLAGENASE 250 UNIT/GM EX OINT
TOPICAL_OINTMENT | Freq: Every day | CUTANEOUS | Status: DC
Start: 2017-06-11 — End: 2017-06-12
  Administered 2017-06-11: 1 via TOPICAL
  Administered 2017-06-12: 17:00:00 via TOPICAL
  Filled 2017-06-11: qty 30

## 2017-06-11 NOTE — Progress Notes (Signed)
PROGRESS NOTE    Matthew Riley  ZOX:[id number] DOB: 1975-09-26 DOA: 06/03/2017 PCP: Lizbeth Bark, FNP    Brief Narrative: 42 year old male with PMH of DM, HTN, CAD s/p stent placement, GERD, and Tobacco abuse. Admission 04/2017 for DKA. Lives with sister. Using home health services.  Presented to ED on 12/31 after being found down in bathroom unresponsive covered in emesis and diarrhea. Upon arrival to ED patient hypotensive and hypoxic, requiring intubated. LA 5.07. Given Vancomycin/Zoysn and 3L NS. AST/ALT 324/233, Crt 2.34. PCCM asked to admit.   Blood has grown strep viridans in one bottle. Repeat cultures remain negative. . Echo 12/31 with poor windows, Ef 35%. Repeat echo 1/2 shows no vegetations and a little better Ef at 40-45%. Mixed flora on sputum gram stain.  A right upper quadrant ultrasound to evaluate his transaminitis as well as elevated amylase and lipase did not show overt obstruction.  Seizure like activity followed by a prolonged period of decreased responsiveness on 1/3. No further seizure like activity and no epileptiform activity on spot EEG. Patient self  extubated 1-05   Assessment & Plan:   Active Problems:   Encephalopathy   Acute respiratory failure with hypoxia (HCC)   Septic shock (HCC)  1-Strep viridans Bacteremia, one of two; PNA aspiration.  Repeat chest x ray improved but persistent bilateral airspace diseases.  Day 8 zosyn. Change to ceftriaxone for 2 more days.  Repeated blood culture negative.   Leukocytosis; on IV antibiotics. Follow trend.  Continue to increase. Chest x ray with improved infiltrates.  He denies diarrhea. Check UA>  Repeat labs in am.  He has bilateral LE abrasion, wound, wound care consulted. Will add doxy.  CT abdomen and pelvis; 1-04; anasarca, unremarkable.  Repeat LFT in am.   2-Acute hypoxemic resp failure - s/p VDRF - cause infiltrates due to aspiration/infection v acute systolic CHF v some combo IV  antibiotics, narrow to ceftriaxone.  On oral lasix.  Repeat chest x ray. Improved airspace diseases.  Oxygen sat 95 RA.  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  Initial ECHO lower EF Troponin normal.  History CAD Stent.  Needs follow up with cardiology.   4-Hypomagnesemia; replaced. Start oral supplement, Hypokalemia ; resolved.   5-DM; increase lantus 30 units. Added  meal coverage.   Transaminases; related to infection. Improving , trending down.   Acute encephalopathy;  Related to acute illness.  Improved.   Sepsis; PNA, aspiration,  treated with IV antibiotics, pressors.  Resolved.   Ulcer to Left Lower Extremity > wound care consult.  Will add Doxy.   Questionable Seizure activity while intubated 12-31;  CT head. negative EEG; negative for seizure.  Monitor for further seizure.   Diarrhea; c diff negative.   RA; resume tramadol. Resume methotrexate when infection resolved.   B/L lower extremity wound.; He report trauma;  But check ABI.  Wound care consulted.   DVT prophylaxis: heparin.  Code Status: full code.  Family Communication: care discussed with patient  Disposition Plan: CIR    Consultants:   CCM admitted patient    Procedures:  06/03/2017 - admit, ECHO 12/31 shoed an Ef=35%, Admission blood cultures are growing strep viridans. No vegetations 06/05/17 - repeat echo - ef 45% with gr 1 diast dysfn 1./5/19 - self extubated      Antimicrobials:   Zosyn 12-31   Subjective: He denies diarrhea. No abdominal pain.  Cough persist   Objective: Vitals:   06/10/17 1548  06/10/17 2057 06/11/17 0440 06/11/17 0858  BP: 131/78 119/80 (!) 137/100 (!) 142/97  Pulse: 92 96 87 85  Resp: 18 (!) 22 17 18   Temp: 98.5 F (36.9 C) 98.6 F (37 C) 98.2 F (36.8 C) 98.4 F (36.9 C)  TempSrc: Oral Oral Oral Oral  SpO2: 95% 95% 92% 95%  Weight:  90 kg (198 lb 6.4 oz)    Height:        Intake/Output Summary (Last 24 hours) at  06/11/2017 1354 Last data filed at 06/11/2017 0858 Gross per 24 hour  Intake 1330 ml  Output 1625 ml  Net -295 ml   Filed Weights   06/09/17 2112 06/10/17 0101 06/10/17 2057  Weight: 95 kg (209 lb 7 oz) 95 kg (209 lb 7 oz) 90 kg (198 lb 6.4 oz)    Examination:  General exam: NAD Respiratory system: Bilateral ronchus.  Cardiovascular system: S 1, S 2 RRR Gastrointestinal system; BS present, soft, Nt Central nervous system: non focal.  Extremities: Symmetric 5 x 5 power. Skin; bilateral LE abrasion, open wound. Left Lower extremity wound with necrotic tissue.       Data Reviewed: I have personally reviewed following labs and imaging studies  CBC: Recent Labs  Lab 06/07/17 0518 06/08/17 0444 06/09/17 0414 06/10/17 0023 06/11/17 0523  WBC 15.6* 12.8* 16.5* 16.2* 18.9*  NEUTROABS 9.2* 8.2* 8.1* 9.0* 11.5*  HGB 9.5* 10.4* 9.6* 10.0* 9.9*  HCT 29.4* 31.0* 29.6* 29.7* 29.5*  MCV 89.4 89.1 89.4 88.4 87.0  PLT 252 285 271 297 343   Basic Metabolic Panel: Recent Labs  Lab 06/06/17 1046 06/07/17 0518 06/08/17 0444 06/09/17 0414 06/10/17 0023 06/10/17 1342 06/11/17 0523  NA  --  136 138 134*  --  131* 130*  K  --  4.0 3.5 3.1*  --  3.3* 3.6  CL  --  104 101 99*  --  93* 94*  CO2  --  28 32 31  --  29 25  GLUCOSE  --  244* 160* 188*  --  186* 328*  BUN  --  25* 17 12  --  15 17  CREATININE  --  1.14 0.97 0.99  --  1.08 1.04  CALCIUM  --  8.6* 9.3 8.2*  --  8.3* 8.0*  MG 2.0 1.7 1.5*  --  1.5*  --  1.8  PHOS  --  3.4  --   --  2.9  --  2.6   GFR: Estimated Creatinine Clearance: 103.5 mL/min (by C-G formula based on SCr of 1.04 mg/dL). Liver Function Tests: Recent Labs  Lab 06/07/17 0518 06/09/17 0414  AST 79* 58*  ALT 77* 52  ALKPHOS 110 92  BILITOT 1.1 1.0  PROT 7.5 7.3  ALBUMIN 2.0* 1.8*   No results for input(s): LIPASE, AMYLASE in the last 168 hours. No results for input(s): AMMONIA in the last 168 hours. Coagulation Profile: No results for input(s):  INR, PROTIME in the last 168 hours. Cardiac Enzymes: Recent Labs  Lab 06/10/17 0023  TROPONINI <0.03   BNP (last 3 results) No results for input(s): PROBNP in the last 8760 hours. HbA1C: No results for input(s): HGBA1C in the last 72 hours. CBG: Recent Labs  Lab 06/10/17 1145 06/10/17 1657 06/10/17 2048 06/11/17 0736 06/11/17 1145  GLUCAP 411* 158* 151* 324* 193*   Lipid Profile: No results for input(s): CHOL, HDL, LDLCALC, TRIG, CHOLHDL, LDLDIRECT in the last 72 hours. Thyroid Function Tests: No results for input(s): TSH, T4TOTAL,  FREET4, T3FREE, THYROIDAB in the last 72 hours. Anemia Panel: No results for input(s): VITAMINB12, FOLATE, FERRITIN, TIBC, IRON, RETICCTPCT in the last 72 hours. Sepsis Labs: Recent Labs  Lab 06/05/17 0540 06/06/17 1046 06/07/17 0518 06/08/17 0444  PROCALCITON 26.37 9.40 5.56 3.41    Recent Results (from the past 240 hour(s))  C difficile quick scan w PCR reflex     Status: None   Collection Time: 06/03/17  3:24 AM  Result Value Ref Range Status   C Diff antigen NEGATIVE NEGATIVE Final   C Diff toxin NEGATIVE NEGATIVE Final   C Diff interpretation No C. difficile detected.  Final  Blood culture (routine x 2)     Status: None   Collection Time: 06/03/17  4:02 AM  Result Value Ref Range Status   Specimen Description BLOOD RIGHT WRIST  Final   Special Requests IN PEDIATRIC BOTTLE Blood Culture adequate volume  Final   Culture NO GROWTH 5 DAYS  Final   Report Status 06/08/2017 FINAL  Final  Blood culture (routine x 2)     Status: Abnormal   Collection Time: 06/03/17  4:48 AM  Result Value Ref Range Status   Specimen Description BLOOD RIGHT HAND  Final   Special Requests IN PEDIATRIC BOTTLE Blood Culture adequate volume  Final   Culture  Setup Time   Final    GRAM POSITIVE COCCI IN CHAINS IN PEDIATRIC BOTTLE CRITICAL RESULT CALLED TO, READ BACK BY AND VERIFIED WITH: J. Carney Pharm.D. 20:50 06/03/17 ([email or web address])    Culture (A)  Final     VIRIDANS STREPTOCOCCUS THE SIGNIFICANCE OF ISOLATING THIS ORGANISM FROM A SINGLE SET OF BLOOD CULTURES WHEN MULTIPLE SETS ARE DRAWN IS UNCERTAIN. PLEASE NOTIFY THE MICROBIOLOGY DEPARTMENT WITHIN ONE WEEK IF SPECIATION AND SENSITIVITIES ARE REQUIRED.    Report Status 06/04/2017 FINAL  Final  Blood Culture ID Panel (Reflexed)     Status: Abnormal   Collection Time: 06/03/17  4:48 AM  Result Value Ref Range Status   Enterococcus species NOT DETECTED NOT DETECTED Final   Listeria monocytogenes NOT DETECTED NOT DETECTED Final   Staphylococcus species NOT DETECTED NOT DETECTED Final   Staphylococcus aureus NOT DETECTED NOT DETECTED Final   Streptococcus species DETECTED (A) NOT DETECTED Final    Comment: Not Enterococcus species, Streptococcus agalactiae, Streptococcus pyogenes, or Streptococcus pneumoniae. CRITICAL RESULT CALLED TO, READ BACK BY AND VERIFIED WITH: J. Carney Pharm.D. 20:50 06/03/17 ([email or web address])    Streptococcus agalactiae NOT DETECTED NOT DETECTED Final   Streptococcus pneumoniae NOT DETECTED NOT DETECTED Final   Streptococcus pyogenes NOT DETECTED NOT DETECTED Final   Acinetobacter baumannii NOT DETECTED NOT DETECTED Final   Enterobacteriaceae species NOT DETECTED NOT DETECTED Final   Enterobacter cloacae complex NOT DETECTED NOT DETECTED Final   Escherichia coli NOT DETECTED NOT DETECTED Final   Klebsiella oxytoca NOT DETECTED NOT DETECTED Final   Klebsiella pneumoniae NOT DETECTED NOT DETECTED Final   Proteus species NOT DETECTED NOT DETECTED Final   Serratia marcescens NOT DETECTED NOT DETECTED Final   Carbapenem resistance NOT DETECTED NOT DETECTED Final   Haemophilus influenzae NOT DETECTED NOT DETECTED Final   Neisseria meningitidis NOT DETECTED NOT DETECTED Final   Pseudomonas aeruginosa NOT DETECTED NOT DETECTED Final   Candida albicans NOT DETECTED NOT DETECTED Final   Candida glabrata NOT DETECTED NOT DETECTED Final   Candida krusei NOT DETECTED NOT DETECTED Final    Candida parapsilosis NOT DETECTED NOT DETECTED Final   Candida tropicalis NOT DETECTED NOT DETECTED Final  Gastrointestinal Panel by PCR , Stool     Status: None   Collection Time: 06/03/17  5:14 AM  Result Value Ref Range Status   Campylobacter species NOT DETECTED NOT DETECTED Final   Plesimonas shigelloides NOT DETECTED NOT DETECTED Final   Salmonella species NOT DETECTED NOT DETECTED Final   Yersinia enterocolitica NOT DETECTED NOT DETECTED Final   Vibrio species NOT DETECTED NOT DETECTED Final   Vibrio cholerae NOT DETECTED NOT DETECTED Final   Enteroaggregative E coli (EAEC) NOT DETECTED NOT DETECTED Final   Enteropathogenic E coli (EPEC) NOT DETECTED NOT DETECTED Final   Enterotoxigenic E coli (ETEC) NOT DETECTED NOT DETECTED Final   Shiga like toxin producing E coli (STEC) NOT DETECTED NOT DETECTED Final   Shigella/Enteroinvasive E coli (EIEC) NOT DETECTED NOT DETECTED Final   Cryptosporidium NOT DETECTED NOT DETECTED Final   Cyclospora cayetanensis NOT DETECTED NOT DETECTED Final   Entamoeba histolytica NOT DETECTED NOT DETECTED Final   Giardia lamblia NOT DETECTED NOT DETECTED Final   Adenovirus F40/41 NOT DETECTED NOT DETECTED Final   Astrovirus NOT DETECTED NOT DETECTED Final   Norovirus GI/GII NOT DETECTED NOT DETECTED Final   Rotavirus A NOT DETECTED NOT DETECTED Final   Sapovirus (I, II, IV, and V) NOT DETECTED NOT DETECTED Final    Comment: Performed at [facility], [address] [place], [place] [address]  OVA + PARASITE EXAM     Status: None   Collection Time: 06/03/17  5:14 AM  Result Value Ref Range Status   OVA + PARASITE EXAM Final report  Final    Comment: (NOTE) These results were obtained using wet preparation(s) and trichrome stained smear. This test does not include testing for Cryptosporidium parvum, Cyclospora, or Microsporidia. Performed At: [facility] [address] [place], [place] [id number] Jolene Schimke MD  DU:[id number]    Source of Sample STOOL  Final  MRSA PCR Screening     Status: None   Collection Time: 06/03/17  6:26 AM  Result Value Ref Range Status   MRSA by PCR NEGATIVE NEGATIVE Final    Comment:        The GeneXpert MRSA Assay (FDA approved for NASAL specimens only), is one component of a comprehensive MRSA colonization surveillance program. It is not intended to diagnose MRSA infection nor to guide or monitor treatment for MRSA infections.   Respiratory Panel by PCR     Status: None   Collection Time: 06/03/17  3:14 PM  Result Value Ref Range Status   Adenovirus NOT DETECTED NOT DETECTED Final   Coronavirus 229E NOT DETECTED NOT DETECTED Final   Coronavirus HKU1 NOT DETECTED NOT DETECTED Final   Coronavirus NL63 NOT DETECTED NOT DETECTED Final   Coronavirus OC43 NOT DETECTED NOT DETECTED Final   Metapneumovirus NOT DETECTED NOT DETECTED Final   Rhinovirus / Enterovirus NOT DETECTED NOT DETECTED Final   Influenza A NOT DETECTED NOT DETECTED Final   Influenza B NOT DETECTED NOT DETECTED Final   Parainfluenza Virus 1 NOT DETECTED NOT DETECTED Final   Parainfluenza Virus 2 NOT DETECTED NOT DETECTED Final   Parainfluenza Virus 3 NOT DETECTED NOT DETECTED Final   Parainfluenza Virus 4 NOT DETECTED NOT DETECTED Final   Respiratory Syncytial Virus NOT DETECTED NOT DETECTED Final   Bordetella pertussis NOT DETECTED NOT DETECTED Final   Chlamydophila pneumoniae NOT DETECTED NOT DETECTED Final   Mycoplasma pneumoniae NOT DETECTED NOT DETECTED Final  Culture, respiratory (NON-Expectorated)     Status: None  Collection Time: 06/03/17  9:37 PM  Result Value Ref Range Status   Specimen Description TRACHEAL ASPIRATE  Final   Special Requests NONE  Final   Gram Stain   Final    RARE WBC PRESENT, PREDOMINANTLY PMN FEW GRAM POSITIVE COCCI IN PAIRS RARE GRAM NEGATIVE RODS RARE GRAM POSITIVE RODS    Culture Consistent with normal respiratory flora.  Final   Report Status  06/06/2017 FINAL  Final  Culture, blood (routine x 2)     Status: None   Collection Time: 06/05/17  9:31 AM  Result Value Ref Range Status   Specimen Description BLOOD LEFT ARM  Final   Special Requests   Final    BOTTLES DRAWN AEROBIC ONLY Blood Culture adequate volume   Culture NO GROWTH 5 DAYS  Final   Report Status 06/10/2017 FINAL  Final  Culture, blood (routine x 2)     Status: None   Collection Time: 06/05/17  9:38 AM  Result Value Ref Range Status   Specimen Description BLOOD LEFT FOREARM  Final   Special Requests   Final    BOTTLES DRAWN AEROBIC ONLY Blood Culture adequate volume   Culture NO GROWTH 5 DAYS  Final   Report Status 06/10/2017 FINAL  Final         Radiology Studies: Dg Chest 2 View  Result Date: 06/11/2017 CLINICAL DATA:  Weakness and headaches EXAM: CHEST  2 VIEW COMPARISON:  06/07/2017 FINDINGS: Endotracheal and NG tubes have been removed. Right jugular central venous catheter removed. Confluent extensive bilateral airspace disease with a central distribution has improved but there is considerable persistent disease. No pneumothorax. No pleural effusion. IMPRESSION: Improved but persistent bilateral airspace disease. The patient is extubated. Electronically Signed   By: Jolaine Click M.D.   On: 06/11/2017 07:54        Scheduled Meds: . aspirin  81 mg Per Tube Daily  . collagenase   Topical Daily  . feeding supplement (PRO-STAT SUGAR FREE 64)  30 mL Per Tube BID  . furosemide  20 mg Oral Daily  . guaiFENesin  600 mg Oral BID  . heparin  5,000 Units Subcutaneous Q8H  . insulin aspart  0-15 Units Subcutaneous TID WC  . insulin aspart  5 Units Subcutaneous TID WC  . insulin glargine  30 Units Subcutaneous QHS  . thiamine  100 mg Oral Daily   Continuous Infusions: . sodium chloride    . cefTRIAXone (ROCEPHIN)  IV Stopped (06/10/17 1749)     LOS: 8 days    Time spent: 35 minutes.     Alba Cory, MD [facility] Pager  [phone]  If 7PM-7AM, please contact night-coverage [email or web address] Password [email or web address] 06/11/2017, 1:54 PM

## 2017-06-11 NOTE — Consult Note (Signed)
WOC Nurse wound consult note Reason for Consult: LE wounds Patient seen by this WOC nurse back in November, at that time he reported injuries from crawling around, not being able to walk. Essentially trauma wounds.  However they are still present and he has very weak bilateral distal pulses. I am not able to find any diagnostic testing for his arterial blood flow. Wound type: Unclear, full thickness wounds bilateral LE. Right medial ankle and left posterior achilles area  Pressure Injury POA:NA Measurement: Right 9cm x 2.5cm x 0cm  Left 4cm x 1.5cm x 0.1cm  Wound bed: Right; 100% eschar Left: 100% yellow Drainage (amount, consistency, odor)none from the RLE, minimal from the LLE.  Not purulent, no odor Periwound: intact, weak distal pulses, dry skin  Dressing procedure/placement/frequency: Will add enzymatic debridement for the LLE because it is open and necrotic.  HOwever concerned about arterial blood flow, would suggest ABIs  Will only add betadine to the RLE at this time, it is eschar and stable. Without knowing etiology would not want to open wound at this time.   Discussed POC with patient and bedside nurse.  Re consult if needed, will not follow at this time. Thanks  Makaio Mach [employer], RN,CWOCN, CNS, CWON-AP [phone])

## 2017-06-11 NOTE — Care Management Note (Signed)
Case Management Note  Patient Details  Name: Matthew Riley MRN: [id number] Date of Birth: Oct 20, 1975  Subjective/Objective:       Leukocytosis, PNA-asp, Strep Viridans bacteremia, acute resp failure             Action/Plan: Discharge Planning: Spoke to pt and he lives at home with sister, Franne Grip # [phone]. States he has RW, crutches, and cane. He is active with [facility] with HHRN, PT and SW. IP rehab following for possible placement. Currently they do not have an available bed. Will reassess for 06/12/2017. Will need resumption of care orders with a F2F if plan is dc home with HH. Goes to [facility] and picks up his meds from clinic for a discounted rate. He has applied for disability. Financial Counselor has been in to see patient.   PCP Arrie Senate R  Expected Discharge Date:                  Expected Discharge Plan:  IP Rehab Facility  In-House Referral:  Clinical Social Work, Museum/gallery exhibitions officer  CM Consult  Post Acute Care Choice:  Home Health, Resumption of Svcs/PTA Provider Choice offered to:  Patient  DME Arranged:    DME Agency:     HH Arranged:  RN, PT, Social Work [employer] Agency:  [facility]  Status of Service:     If discussed at [employer], dates discussed:    Additional Comments:  Elliot Cousin, RN 06/11/2017, 3:19 PM

## 2017-06-11 NOTE — Progress Notes (Signed)
Nutrition Follow-up  DOCUMENTATION CODES:   Not applicable  INTERVENTION:   -Continue Pro-Stat BID   NUTRITION DIAGNOSIS:   Inadequate oral intake related to inability to eat as evidenced by NPO status.  Improving as po intake 75-100%  GOAL:   Patient will meet greater than or equal to 90% of their needs  Progressing  MONITOR:   Vent status, I & O's  REASON FOR ASSESSMENT:   Ventilator    ASSESSMENT:   Pt with PMH of type 1 DM, HTN, CAD s/p stent, GERD, with recent admission 04/2017 for DKA. Pt lives with sister. Admitted with acute hypoxic respiratory failure d/t aspiration PNA and septic shock.  1/5 Extubated  Recorded po intake 100% of meals  Labs: sodium 130, CBGs 151-411 Meds: lasix, thiamine, lasix, lantus  Diet Order:  Diet heart healthy/carb modified Room service appropriate? Yes; Fluid consistency: Thin  EDUCATION NEEDS:   No education needs have been identified at this time  Skin:  Skin Assessment: Skin Integrity Issues: Skin Integrity Issues:: Other (Comment) Other: non-pressure wounds on b/l LE, R ankle, L achilles  Last BM:  1/7 (rectal tube removed)  Height:   Ht Readings from Last 1 Encounters:  06/03/17 6\' 1"  (1.854 m)    Weight:   Wt Readings from Last 1 Encounters:  06/10/17 198 lb 6.4 oz (90 kg)    Ideal Body Weight:  83.6 kg  BMI:  Body mass index is 26.18 kg/m.  Estimated Nutritional Needs:   Kcal:  2400-2700 kcals  Protein:  120-135 g  Fluid:  > 2 L/day   08/08/17 MS, RD, LDN, CNSC [phone] Pager  [phone] Weekend/On-Call Pager

## 2017-06-11 NOTE — Progress Notes (Signed)
Inpatient Diabetes Program Recommendations  AACE/ADA: New Consensus Statement on Inpatient Glycemic Control (2015)  Target Ranges:  Prepandial:   less than 140 mg/dL      Peak postprandial:   less than 180 mg/dL (1-2 hours)      Critically ill patients:  140 - 180 mg/dL   Lab Results  Component Value Date   GLUCAP 324 (H) 06/11/2017   HGBA1C 12.9 (H) 03/08/2017   Review of Glycemic Control  Diabetes history: DM 2 Outpatient Diabetes medications: 70/30 38 units BID, Novolog 5 units tid Current orders for Inpatient glycemic control: Lantus 27 units, Novolog Moderate 0-15 units tid + Novolog 5 units tid meal coverage  Inpatient Diabetes Program Recommendations:    Patient takes the equivalent of 53 units of basal insulin at home with 70/30 insulin dose. Fasting glucose 324 this am. Consider increasing basal insulin to Lantus 35 units (0.4 units/kg).  Thanks,  Christena Deem RN, MSN, [facility] Inpatient Diabetes Coordinator Team Pager [phone] (8a-5p)

## 2017-06-11 NOTE — Progress Notes (Signed)
Inpatient Rehabilitation  Met with patient at bedside to discuss team's recommendation for IP Rehab.  Shared booklets, insurance verification letter, and answered questions.  Patient eager to regain his independence and participate in the program.  Note that we had limited bed availability today.  Plan to follow with the team tomorrow when I know of tomorrow's bed availability.  Call if questions.     , M.A., CCC/SLP Admission Coordinator  [facility]  Cell [phone]  

## 2017-06-12 ENCOUNTER — Inpatient Hospital Stay (HOSPITAL_COMMUNITY)
Admission: RE | Admit: 2017-06-12 | Discharge: 2017-06-24 | DRG: 945 | Disposition: A | Discharge status: Home / Self Care | Payer: Medicaid Other | Source: Ambulatory Visit | Attending: Physical Medicine & Rehabilitation | Admitting: Physical Medicine & Rehabilitation

## 2017-06-12 ENCOUNTER — Encounter (HOSPITAL_COMMUNITY): Payer: Self-pay | Admitting: Physical Medicine and Rehabilitation

## 2017-06-12 ENCOUNTER — Encounter (HOSPITAL_COMMUNITY): Payer: Self-pay | Admitting: Nurse Practitioner

## 2017-06-12 ENCOUNTER — Other Ambulatory Visit: Payer: Self-pay | Admitting: Family Medicine

## 2017-06-12 ENCOUNTER — Inpatient Hospital Stay (HOSPITAL_COMMUNITY): Payer: Medicaid Other

## 2017-06-12 DIAGNOSIS — R7401 Elevation of levels of liver transaminase levels: Secondary | ICD-10-CM

## 2017-06-12 DIAGNOSIS — Z8673 Personal history of transient ischemic attack (TIA), and cerebral infarction without residual deficits: Secondary | ICD-10-CM | POA: Diagnosis not present

## 2017-06-12 DIAGNOSIS — Z794 Long term (current) use of insulin: Secondary | ICD-10-CM

## 2017-06-12 DIAGNOSIS — I5021 Acute systolic (congestive) heart failure: Secondary | ICD-10-CM

## 2017-06-12 DIAGNOSIS — M545 Low back pain: Secondary | ICD-10-CM | POA: Diagnosis present

## 2017-06-12 DIAGNOSIS — M069 Rheumatoid arthritis, unspecified: Secondary | ICD-10-CM

## 2017-06-12 DIAGNOSIS — E875 Hyperkalemia: Secondary | ICD-10-CM | POA: Diagnosis present

## 2017-06-12 DIAGNOSIS — I130 Hypertensive heart and chronic kidney disease with heart failure and stage 1 through stage 4 chronic kidney disease, or unspecified chronic kidney disease: Secondary | ICD-10-CM | POA: Diagnosis present

## 2017-06-12 DIAGNOSIS — I2510 Atherosclerotic heart disease of native coronary artery without angina pectoris: Secondary | ICD-10-CM

## 2017-06-12 DIAGNOSIS — D62 Acute posthemorrhagic anemia: Secondary | ICD-10-CM | POA: Diagnosis present

## 2017-06-12 DIAGNOSIS — R059 Cough, unspecified: Secondary | ICD-10-CM

## 2017-06-12 DIAGNOSIS — D72829 Elevated white blood cell count, unspecified: Secondary | ICD-10-CM

## 2017-06-12 DIAGNOSIS — E871 Hypo-osmolality and hyponatremia: Secondary | ICD-10-CM | POA: Diagnosis present

## 2017-06-12 DIAGNOSIS — R5381 Other malaise: Secondary | ICD-10-CM

## 2017-06-12 DIAGNOSIS — N183 Chronic kidney disease, stage 3 (moderate): Secondary | ICD-10-CM | POA: Diagnosis present

## 2017-06-12 DIAGNOSIS — J690 Pneumonitis due to inhalation of food and vomit: Secondary | ICD-10-CM | POA: Diagnosis present

## 2017-06-12 DIAGNOSIS — M62838 Other muscle spasm: Secondary | ICD-10-CM

## 2017-06-12 DIAGNOSIS — Z87891 Personal history of nicotine dependence: Secondary | ICD-10-CM

## 2017-06-12 DIAGNOSIS — R740 Nonspecific elevation of levels of transaminase and lactic acid dehydrogenase [LDH]: Secondary | ICD-10-CM

## 2017-06-12 DIAGNOSIS — G8929 Other chronic pain: Secondary | ICD-10-CM | POA: Diagnosis present

## 2017-06-12 DIAGNOSIS — E1022 Type 1 diabetes mellitus with diabetic chronic kidney disease: Secondary | ICD-10-CM | POA: Diagnosis present

## 2017-06-12 DIAGNOSIS — Z09 Encounter for follow-up examination after completed treatment for conditions other than malignant neoplasm: Secondary | ICD-10-CM

## 2017-06-12 DIAGNOSIS — Z955 Presence of coronary angioplasty implant and graft: Secondary | ICD-10-CM

## 2017-06-12 DIAGNOSIS — K219 Gastro-esophageal reflux disease without esophagitis: Secondary | ICD-10-CM | POA: Diagnosis present

## 2017-06-12 DIAGNOSIS — E1040 Type 1 diabetes mellitus with diabetic neuropathy, unspecified: Secondary | ICD-10-CM | POA: Diagnosis not present

## 2017-06-12 DIAGNOSIS — E162 Hypoglycemia, unspecified: Secondary | ICD-10-CM

## 2017-06-12 DIAGNOSIS — L97919 Non-pressure chronic ulcer of unspecified part of right lower leg with unspecified severity: Secondary | ICD-10-CM

## 2017-06-12 DIAGNOSIS — Z833 Family history of diabetes mellitus: Secondary | ICD-10-CM | POA: Diagnosis not present

## 2017-06-12 DIAGNOSIS — E10649 Type 1 diabetes mellitus with hypoglycemia without coma: Secondary | ICD-10-CM | POA: Diagnosis present

## 2017-06-12 DIAGNOSIS — N179 Acute kidney failure, unspecified: Secondary | ICD-10-CM | POA: Diagnosis present

## 2017-06-12 DIAGNOSIS — R05 Cough: Secondary | ICD-10-CM

## 2017-06-12 DIAGNOSIS — R7309 Other abnormal glucose: Secondary | ICD-10-CM

## 2017-06-12 DIAGNOSIS — J189 Pneumonia, unspecified organism: Secondary | ICD-10-CM

## 2017-06-12 DIAGNOSIS — E1042 Type 1 diabetes mellitus with diabetic polyneuropathy: Secondary | ICD-10-CM

## 2017-06-12 DIAGNOSIS — L97929 Non-pressure chronic ulcer of unspecified part of left lower leg with unspecified severity: Secondary | ICD-10-CM

## 2017-06-12 DIAGNOSIS — M792 Neuralgia and neuritis, unspecified: Secondary | ICD-10-CM

## 2017-06-12 DIAGNOSIS — I5022 Chronic systolic (congestive) heart failure: Secondary | ICD-10-CM

## 2017-06-12 HISTORY — DX: Atherosclerotic heart disease of native coronary artery without angina pectoris: I25.10

## 2017-06-12 HISTORY — DX: Unspecified viral hepatitis B without hepatic coma: B19.10

## 2017-06-12 HISTORY — DX: Gastro-esophageal reflux disease without esophagitis: K21.9

## 2017-06-12 HISTORY — DX: Rheumatoid arthritis, unspecified: M06.9

## 2017-06-12 LAB — CBC WITH DIFFERENTIAL/PLATELET
Basophils Absolute: 0 10*3/uL (ref 0.0–0.1)
Basophils Relative: 0 %
EOS ABS: 0.7 10*3/uL (ref 0.0–0.7)
Eosinophils Relative: 4 %
HCT: 30.6 % — ABNORMAL LOW (ref 39.0–52.0)
Hemoglobin: 10.8 g/dL — ABNORMAL LOW (ref 13.0–17.0)
LYMPHS PCT: 26 %
Lymphs Abs: 4.2 10*3/uL — ABNORMAL HIGH (ref 0.7–4.0)
MCH: 30.3 pg (ref 26.0–34.0)
MCHC: 35.3 g/dL (ref 30.0–36.0)
MCV: 86 fL (ref 78.0–100.0)
MONO ABS: 1.1 10*3/uL — AB (ref 0.1–1.0)
Monocytes Relative: 7 %
NEUTROS PCT: 63 %
Neutro Abs: 10.3 10*3/uL — ABNORMAL HIGH (ref 1.7–7.7)
PLATELETS: 338 10*3/uL (ref 150–400)
RBC: 3.56 MIL/uL — ABNORMAL LOW (ref 4.22–5.81)
RDW: 13.4 % (ref 11.5–15.5)
WBC: 16.3 10*3/uL — AB (ref 4.0–10.5)

## 2017-06-12 LAB — HEPATIC FUNCTION PANEL
ALBUMIN: 2 g/dL — AB (ref 3.5–5.0)
ALK PHOS: 93 U/L (ref 38–126)
ALT: 48 U/L (ref 17–63)
AST: 68 U/L — ABNORMAL HIGH (ref 15–41)
Bilirubin, Direct: <0.1 mg/dL — ABNORMAL LOW (ref 0.1–0.5)
TOTAL PROTEIN: 7.7 g/dL (ref 6.5–8.1)
Total Bilirubin: 0.6 mg/dL (ref 0.3–1.2)

## 2017-06-12 LAB — BASIC METABOLIC PANEL
Anion gap: 7 (ref 5–15)
BUN: 17 mg/dL (ref 6–20)
CHLORIDE: 96 mmol/L — AB (ref 101–111)
CO2: 24 mmol/L (ref 22–32)
Calcium: 8.2 mg/dL — ABNORMAL LOW (ref 8.9–10.3)
Creatinine, Ser: 0.86 mg/dL (ref 0.61–1.24)
GFR calc Af Amer: >60 mL/min (ref >60)
Glucose, Bld: 262 mg/dL — ABNORMAL HIGH (ref 65–99)
POTASSIUM: 3.7 mmol/L (ref 3.5–5.1)
SODIUM: 127 mmol/L — AB (ref 135–145)

## 2017-06-12 LAB — GLUCOSE, CAPILLARY
GLUCOSE-CAPILLARY: 251 mg/dL — AB (ref 65–99)
GLUCOSE-CAPILLARY: 37 mg/dL — AB (ref 65–99)
GLUCOSE-CAPILLARY: 72 mg/dL (ref 65–99)
GLUCOSE-CAPILLARY: 189 mg/dL — AB (ref 65–99)
GLUCOSE-CAPILLARY: 308 mg/dL — AB (ref 65–99)
Glucose-Capillary: 155 mg/dL — ABNORMAL HIGH (ref 65–99)

## 2017-06-12 LAB — PHOSPHORUS: Phosphorus: 2.9 mg/dL (ref 2.5–4.6)

## 2017-06-12 LAB — MAGNESIUM: MAGNESIUM: 1.6 mg/dL — AB (ref 1.7–2.4)

## 2017-06-12 MED ORDER — ASPIRIN EC 81 MG PO TBEC
81.0000 mg | DELAYED_RELEASE_TABLET | Freq: Every day | ORAL | Status: DC
  Administered 2017-06-13 – 2017-06-24 (×12): 81 mg via ORAL
  Filled 2017-06-12 (×12): qty 1

## 2017-06-12 MED ORDER — PROCHLORPERAZINE MALEATE 5 MG PO TABS
5.0000 mg | ORAL_TABLET | Freq: Four times a day (QID) | ORAL | Status: DC | PRN
  Filled 2017-06-12: qty 2

## 2017-06-12 MED ORDER — BISACODYL 10 MG RE SUPP: 10.0000 mg | Freq: Every day | RECTAL | Status: DC | PRN

## 2017-06-12 MED ORDER — ALUM & MAG HYDROXIDE-SIMETH 200-200-20 MG/5ML PO SUSP: 30.0000 mL | ORAL | Status: DC | PRN

## 2017-06-12 MED ORDER — PRO-STAT SUGAR FREE PO LIQD
30.0000 mL | Freq: Two times a day (BID) | ORAL | Status: DC
  Administered 2017-06-12 – 2017-06-24 (×24): 30 mL via ORAL
  Filled 2017-06-12 (×24): qty 30

## 2017-06-12 MED ORDER — CYCLOBENZAPRINE HCL 5 MG PO TABS
5.0000 mg | ORAL_TABLET | Freq: Three times a day (TID) | ORAL | Status: DC | PRN
  Administered 2017-06-13 – 2017-06-24 (×22): 5 mg via ORAL
  Filled 2017-06-12 (×26): qty 1

## 2017-06-12 MED ORDER — INSULIN GLARGINE 100 UNIT/ML ~~LOC~~ SOLN: 30.0000 [IU] | Freq: Every day | SUBCUTANEOUS | 11 refills | Status: DC

## 2017-06-12 MED ORDER — INSULIN ASPART 100 UNIT/ML ~~LOC~~ SOLN
0.0000 [IU] | Freq: Three times a day (TID) | SUBCUTANEOUS | Status: DC
  Administered 2017-06-13: 3 [IU] via SUBCUTANEOUS
  Administered 2017-06-13: 8 [IU] via SUBCUTANEOUS
  Administered 2017-06-14: 5 [IU] via SUBCUTANEOUS
  Administered 2017-06-14: 2 [IU] via SUBCUTANEOUS
  Administered 2017-06-15: 8 [IU] via SUBCUTANEOUS
  Administered 2017-06-16: 5 [IU] via SUBCUTANEOUS
  Administered 2017-06-16: 3 [IU] via SUBCUTANEOUS
  Administered 2017-06-17: 11 [IU] via SUBCUTANEOUS

## 2017-06-12 MED ORDER — INSULIN GLARGINE 100 UNIT/ML ~~LOC~~ SOLN: 25.0000 [IU] | Freq: Every day | SUBCUTANEOUS | 11 refills | Status: DC

## 2017-06-12 MED ORDER — ENOXAPARIN SODIUM 40 MG/0.4ML ~~LOC~~ SOLN
40.0000 mg | SUBCUTANEOUS | Status: DC
  Administered 2017-06-12 – 2017-06-23 (×12): 40 mg via SUBCUTANEOUS
  Filled 2017-06-12 (×12): qty 0.4

## 2017-06-12 MED ORDER — DOXYCYCLINE HYCLATE 100 MG PO TABS
100.0000 mg | ORAL_TABLET | Freq: Two times a day (BID) | ORAL | Status: DC
  Administered 2017-06-12 – 2017-06-13 (×3): 100 mg via ORAL
  Filled 2017-06-12 (×3): qty 1

## 2017-06-12 MED ORDER — INSULIN ASPART 100 UNIT/ML ~~LOC~~ SOLN
0.0000 [IU] | Freq: Every day | SUBCUTANEOUS | Status: DC
  Administered 2017-06-12: 4 [IU] via SUBCUTANEOUS
  Administered 2017-06-14: 5 [IU] via SUBCUTANEOUS

## 2017-06-12 MED ORDER — GUAIFENESIN-DM 100-10 MG/5ML PO SYRP
5.0000 mL | ORAL_SOLUTION | Freq: Four times a day (QID) | ORAL | Status: DC | PRN
Start: 2017-06-12 — End: 2017-06-24

## 2017-06-12 MED ORDER — IPRATROPIUM-ALBUTEROL 0.5-2.5 (3) MG/3ML IN SOLN: 3.0000 mL | Freq: Four times a day (QID) | RESPIRATORY_TRACT | Status: DC | PRN

## 2017-06-12 MED ORDER — DEXTROSE 5 % IV SOLN
2.0000 g | INTRAVENOUS | Status: DC
  Filled 2017-06-12: qty 2

## 2017-06-12 MED ORDER — HYDROCERIN EX CREA
TOPICAL_CREAM | Freq: Two times a day (BID) | CUTANEOUS | Status: DC
  Administered 2017-06-12 – 2017-06-24 (×24): via TOPICAL
  Filled 2017-06-12 (×2): qty 113

## 2017-06-12 MED ORDER — GUAIFENESIN ER 600 MG PO TB12
600.0000 mg | ORAL_TABLET | Freq: Two times a day (BID) | ORAL | Status: DC
  Administered 2017-06-12 – 2017-06-24 (×24): 600 mg via ORAL
  Filled 2017-06-12 (×24): qty 1

## 2017-06-12 MED ORDER — POLYETHYLENE GLYCOL 3350 17 G PO PACK
17.0000 g | PACK | Freq: Every day | ORAL | Status: DC | PRN
  Administered 2017-06-20: 17 g via ORAL
  Filled 2017-06-12: qty 1

## 2017-06-12 MED ORDER — PREMIER PROTEIN SHAKE
11.0000 [oz_av] | Freq: Three times a day (TID) | ORAL | Status: DC
  Administered 2017-06-12 – 2017-06-17 (×15): 11 [oz_av] via ORAL
  Filled 2017-06-12 (×24): qty 325.31

## 2017-06-12 MED ORDER — DEXTROSE 5 % IV SOLN
2.0000 g | INTRAVENOUS | Status: DC
  Administered 2017-06-13 – 2017-06-16 (×4): 2 g via INTRAVENOUS
  Filled 2017-06-12 (×4): qty 2

## 2017-06-12 MED ORDER — PROCHLORPERAZINE EDISYLATE 5 MG/ML IJ SOLN
5.0000 mg | Freq: Four times a day (QID) | INTRAMUSCULAR | Status: DC | PRN
  Administered 2017-06-23: 10 mg via INTRAMUSCULAR
  Filled 2017-06-12: qty 2

## 2017-06-12 MED ORDER — GABAPENTIN 300 MG PO CAPS
300.0000 mg | ORAL_CAPSULE | Freq: Every day | ORAL | Status: DC
  Administered 2017-06-12 – 2017-06-14 (×3): 300 mg via ORAL
  Filled 2017-06-12 (×3): qty 1

## 2017-06-12 MED ORDER — DIPHENHYDRAMINE HCL 12.5 MG/5ML PO ELIX: 12.5000 mg | ORAL_SOLUTION | Freq: Four times a day (QID) | ORAL | Status: DC | PRN

## 2017-06-12 MED ORDER — FLEET ENEMA 7-19 GM/118ML RE ENEM: 1.0000 | ENEMA | Freq: Once | RECTAL | Status: DC | PRN

## 2017-06-12 MED ORDER — PROCHLORPERAZINE 25 MG RE SUPP: 12.5000 mg | Freq: Four times a day (QID) | RECTAL | Status: DC | PRN

## 2017-06-12 MED ORDER — INSULIN ASPART PROT & ASPART (70-30 MIX) 100 UNIT/ML ~~LOC~~ SUSP
30.0000 [IU] | Freq: Two times a day (BID) | SUBCUTANEOUS | Status: DC
  Administered 2017-06-12: 15 [IU] via SUBCUTANEOUS
  Administered 2017-06-13 – 2017-06-14 (×3): 30 [IU] via SUBCUTANEOUS
  Filled 2017-06-12: qty 10

## 2017-06-12 MED ORDER — DOXYCYCLINE HYCLATE 100 MG PO TABS: 100.0000 mg | ORAL_TABLET | Freq: Two times a day (BID) | ORAL | 0 refills | Status: DC

## 2017-06-12 MED ORDER — GUAIFENESIN ER 600 MG PO TB12: 600.0000 mg | ORAL_TABLET | Freq: Two times a day (BID) | ORAL | 0 refills | Status: DC

## 2017-06-12 MED ORDER — ACETAMINOPHEN 325 MG PO TABS
325.0000 mg | ORAL_TABLET | ORAL | Status: DC | PRN
  Administered 2017-06-14 – 2017-06-15 (×2): 650 mg via ORAL
  Administered 2017-06-19 (×2): 325 mg via ORAL
  Administered 2017-06-22 – 2017-06-23 (×2): 650 mg via ORAL
  Filled 2017-06-12: qty 2
  Filled 2017-06-12: qty 1
  Filled 2017-06-12 (×3): qty 2

## 2017-06-12 MED ORDER — MENTHOL 3 MG MT LOZG
1.0000 | LOZENGE | OROMUCOSAL | Status: DC | PRN
  Filled 2017-06-12: qty 9

## 2017-06-12 MED ORDER — COLLAGENASE 250 UNIT/GM EX OINT
TOPICAL_OINTMENT | Freq: Every day | CUTANEOUS | Status: DC
  Administered 2017-06-13 – 2017-06-14 (×2): via TOPICAL
  Administered 2017-06-14: 1 via TOPICAL
  Administered 2017-06-15 – 2017-06-23 (×9): via TOPICAL
  Administered 2017-06-24: 1 via TOPICAL
  Filled 2017-06-12: qty 30

## 2017-06-12 MED ORDER — THIAMINE HCL 100 MG PO TABS: 100.0000 mg | ORAL_TABLET | Freq: Every day | ORAL | 0 refills | Status: AC

## 2017-06-12 MED ORDER — TRAMADOL HCL 50 MG PO TABS
50.0000 mg | ORAL_TABLET | Freq: Four times a day (QID) | ORAL | Status: DC | PRN
  Administered 2017-06-12 – 2017-06-24 (×36): 50 mg via ORAL
  Filled 2017-06-12 (×37): qty 1

## 2017-06-12 MED ORDER — INSULIN GLARGINE 100 UNIT/ML ~~LOC~~ SOLN
25.0000 [IU] | Freq: Every day | SUBCUTANEOUS | Status: DC
  Filled 2017-06-12: qty 0.25

## 2017-06-12 MED ORDER — FAMOTIDINE 20 MG PO TABS
20.0000 mg | ORAL_TABLET | Freq: Two times a day (BID) | ORAL | Status: DC
  Administered 2017-06-12 – 2017-06-24 (×24): 20 mg via ORAL
  Filled 2017-06-12 (×24): qty 1

## 2017-06-12 MED ORDER — MAGNESIUM SULFATE 2 GM/50ML IV SOLN
2.0000 g | Freq: Once | INTRAVENOUS | Status: AC
  Administered 2017-06-12: 2 g via INTRAVENOUS
  Filled 2017-06-12: qty 50

## 2017-06-12 MED ORDER — VITAMIN B-1 100 MG PO TABS
100.0000 mg | ORAL_TABLET | Freq: Every day | ORAL | Status: DC
  Administered 2017-06-13 – 2017-06-24 (×12): 100 mg via ORAL
  Filled 2017-06-12 (×12): qty 1

## 2017-06-12 MED ORDER — CEFTRIAXONE SODIUM IN DEXTROSE 40 MG/ML IV SOLN: 2.0000 g | INTRAVENOUS | 0 refills | Status: DC

## 2017-06-12 MED ORDER — TRAZODONE HCL 50 MG PO TABS
25.0000 mg | ORAL_TABLET | Freq: Every evening | ORAL | Status: DC | PRN
  Administered 2017-06-12 – 2017-06-22 (×9): 50 mg via ORAL
  Filled 2017-06-12 (×9): qty 1

## 2017-06-12 NOTE — H&P (Signed)
Physical Medicine and Rehabilitation Admission H&P    Chief Complaint  Patient presents with  . 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 male with history of HTN, CKD stage III, chronic LBP, T1DM-- uncontrolled, CVA 02/2017 (moved to [place] to be with family), CAD s/p stenting with DKA 04/2017 who was admitted on 06/03/17 after found unresponsive at home with evidence of aspiration. UDS positive for THC and benzos. History taken from chart review. Patient with reports of N/V with diarrhea and was intubated due to decreased LOC and treated for sepsis with pressors and broad spectrum antibiotics.  CT chest/abdomen showed diffuse bilateral PNA.   One blood culture positive for strep viridans. CT abdomen repeated showing anasarca but no abscess or abnormality.  CT head reviewed, negative for acute process. He self extubated on tolerated extubation on 11/5 and respiratory status stable. Hypotension treated with fluid boluses. Electrolyte abnormality resolving and antibiotics narrowed to ceftriaxone on 01/8. BLE wounds treated with local measures as well as doxycyline--ABI's ordered for work up.  Therapy ongoing and CIR recommended due to functional deficits.   Review of Systems  Constitutional: Negative for chills and fever.  HENT: Negative for hearing loss and tinnitus.   Eyes: Positive for blurred vision (needs glasses).  Respiratory: Positive for cough. Negative for shortness of breath.   Cardiovascular: Negative for chest pain and palpitations.  Gastrointestinal: Negative for constipation, heartburn and nausea.       Bloating  Genitourinary: Negative for dysuria and urgency.  Musculoskeletal: Positive for falls and myalgias (muscle spasms BLE).  Skin: Negative for itching and rash.       Bilateral calf ulcer from injury due to fall in November.    Neurological: Positive for sensory change (numbness and tingling BUE/BLE--uses tramadol) and weakness.    Psychiatric/Behavioral: Positive for memory loss.  All other systems reviewed and are negative.    Past Medical History:  Diagnosis Date  . Diabetes mellitus without complication ([facility])   . Hypertension   . Renal disorder   . Stroke [facility])     Past Surgical History:  Procedure Laterality Date  . CORONARY ANGIOPLASTY WITH STENT PLACEMENT      Family History  Problem Relation Age of Onset  . Diabetes Mother   . Diabetes Father     Social History: Single. 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 [facility] has been providing therapy since his move her. he shares an apartment with sister (works 12 hours day). Independent with walker PTA. He  reports that he has quit smoking--months ago. He uses marijuana occasionally. He denies alcohol use.      Allergies: No Known Allergies    Facility-Administered Medications Prior to Admission  Medication Dose Route Frequency Provider Last Rate Last Dose  . sodium chloride 0.9 % bolus 1,000 mL  1,000 mL Intravenous Once Alfonse Spruce, FNP       Medications Prior to Admission  Medication Sig Dispense Refill  . aspirin EC 81 MG tablet Take 1 tablet (81 mg total) by mouth daily. 30 tablet 11  . cyclobenzaprine (FLEXERIL) 10 MG tablet Take 0.5 tablets (5 mg total) by mouth 3 (three) times daily as needed for muscle spasms. 30 tablet 1  . furosemide (LASIX) 20 MG tablet Take 1 tablet (20 mg total) by mouth daily. 30 tablet 0  . gabapentin (NEURONTIN) 300 MG capsule Take 1 capsule (300 mg total) by mouth 3 (  three) times daily. 90 capsule 2  . ibuprofen (ADVIL,MOTRIN) 800 MG tablet Take 800 mg by mouth every 8 (eight) hours as needed for moderate pain.  0  . indomethacin (INDOCIN) 50 MG capsule Take 1 capsule (50 mg total) by mouth 3 (three) times daily as needed for moderate pain. Take with a meal. 60 capsule 1  . insulin aspart (NOVOLOG) 100 UNIT/ML injection Inject 5 Units into the skin 3 (three) times daily  with meals. 10 mL 3  . insulin aspart protamine- aspart (NOVOLOG MIX 70/30) (70-30) 100 UNIT/ML injection Inject 0.38 mLs (38 Units total) into the skin 2 (two) times daily with a meal. 30 mL 8  . lisinopril (PRINIVIL,ZESTRIL) 40 MG tablet Take 1 tablet (40 mg total) by mouth daily. 30 tablet 2  . methotrexate (RHEUMATREX) 2.5 MG tablet Take 3 tablets (7.5 mg total) by mouth once a week. Caution:Chemotherapy. Protect from light. 24 tablet 0  . mupirocin ointment (BACTROBAN) 2 % Apply 1 application topically 2 (two) times daily. For 7 days. 22 g 0  . ondansetron (ZOFRAN) 4 MG tablet Take 1 tablet (4 mg total) by mouth every 8 (eight) hours as needed for nausea. 20 tablet 0  . pantoprazole (PROTONIX) 40 MG tablet Take 1 tablet (40 mg total) by mouth daily. 30 tablet 1  . blood glucose meter kit and supplies Dispense based on patient and insurance preference. Use up to four times daily as directed. (FOR ICD-9 250.00, 250.01). 1 each 0  . traMADol (ULTRAM) 50 MG tablet Take 1 tablet (50 mg total) by mouth every 8 (eight) hours as needed for severe pain. 30 tablet 0    Drug Regimen Review  Drug regimen was reviewed and remains appropriate with no significant issues identified  Home: Home Living Family/patient expects to be discharged to:: Private residence Living Arrangements: Other relatives(sister) Available Help at Discharge: Family, Available PRN/intermittently Type of Home: House Home Access: Level entry Home Layout: Two level Alternate Level Stairs-Number of Steps: 16 Alternate Level Stairs-Rails: Left Bathroom Shower/Tub: Chiropodist: Standard Home Equipment: Walker - 2 wheels   Functional History: Prior Function Level of Independence: Independent Comments: independent prior to 04/2017 admission. Pt has been utilizing RW following that admission and receiving HHPT.   Functional Status:  Mobility: Bed Mobility Overal bed mobility: Needs Assistance Bed  Mobility: Supine to Sit Supine to sit: Min assist Sit to supine: Mod assist General bed mobility comments: pt OOB in recliner chair upon arrival Transfers Overall transfer level: Needs assistance Equipment used: Rolling walker (2 wheeled) Transfers: Sit to/from Stand Sit to Stand: Min assist General transfer comment: from recliner chair, assist for stability with transition, cueing for safe hand placement Ambulation/Gait Ambulation/Gait assistance: Min guard Ambulation Distance (Feet): 75 Feet Assistive device: Rolling walker (2 wheeled) Gait Pattern/deviations: Step-through pattern, Decreased stride length, Trunk flexed General Gait Details: cueing for improved upright posture, mild instability with RW but no overt LOB or need for physical assistance, min guard for safety; pt limited secondary to fatigue Gait velocity: decreased Gait velocity interpretation: Below normal speed for age/gender    ADL:    Cognition: Cognition Overall Cognitive Status: Within Functional Limits for tasks assessed Orientation Level: Oriented to person, Oriented to place, Disoriented to time, Disoriented to situation Cognition Arousal/Alertness: Awake/alert Behavior During Therapy: Flat affect Overall Cognitive Status: Within Functional Limits for tasks assessed   Blood pressure (!) 128/94, pulse 87, temperature 98.3 F (36.8 C), temperature source Oral, resp. rate 18, height 6'  1" (1.854 m), weight 92.6 kg (204 lb 2.3 oz), SpO2 93 %. Physical Exam  Nursing note and vitals reviewed. Constitutional: He is oriented to person, place, and time. He appears well-developed and well-nourished. No distress.  HENT:  Head: Normocephalic and atraumatic.  Mouth/Throat: Oropharynx is clear and moist.  Eyes: Conjunctivae and EOM are normal. Pupils are equal, round, and reactive to light.  Neck: Normal range of motion. Neck supple.  Cardiovascular: Normal rate and regular rhythm.  Respiratory: Effort normal and  breath sounds normal. No stridor.  GI: Soft. Bowel sounds are normal. He exhibits no distension. There is no tenderness.  Musculoskeletal: He exhibits edema.  Neurological: He is alert and oriented to person, place, and time.  Mild dysarthria.  Able to follow simple one and two step commands without difficulty. Able to answer most biographical questions.  Motor: 4-4+/5 throughout  Skin: Skin is warm and dry. He is not diaphoretic.  Healing abrasions bilateral shins.  Ulcer right calf with large loose scab.  Linear ulcer left shin.    Psychiatric: Judgment normal. His mood appears anxious.    Results for orders placed or performed during the hospital encounter of 06/03/17 (from the past 48 hour(s))  Basic metabolic panel     Status: Abnormal   Collection Time: 06/10/17  1:42 PM  Result Value Ref Range   Sodium 131 (L) 135 - 145 mmol/L   Potassium 3.3 (L) 3.5 - 5.1 mmol/L   Chloride 93 (L) 101 - 111 mmol/L   CO2 29 22 - 32 mmol/L   Glucose, Bld 186 (H) 65 - 99 mg/dL   BUN 15 6 - 20 mg/dL   Creatinine, Ser 1.08 0.61 - 1.24 mg/dL   Calcium 8.3 (L) 8.9 - 10.3 mg/dL   GFR calc non Af Amer >60 >60 mL/min   GFR calc Af Amer >60 >60 mL/min    Comment: (NOTE) The eGFR has been calculated using the CKD EPI equation. This calculation has not been validated in all clinical situations. eGFR's persistently <60 mL/min signify possible Chronic Kidney Disease.    Anion gap 9 5 - 15  Glucose, capillary     Status: Abnormal   Collection Time: 06/10/17  4:57 PM  Result Value Ref Range   Glucose-Capillary 158 (H) 65 - 99 mg/dL  Glucose, capillary     Status: Abnormal   Collection Time: 06/10/17  8:48 PM  Result Value Ref Range   Glucose-Capillary 151 (H) 65 - 99 mg/dL  CBC with Differential/Platelet     Status: Abnormal   Collection Time: 06/11/17  5:23 AM  Result Value Ref Range   WBC 18.9 (H) 4.0 - 10.5 K/uL   RBC 3.39 (L) 4.22 - 5.81 MIL/uL   Hemoglobin 9.9 (L) 13.0 - 17.0 g/dL   HCT  29.5 (L) 39.0 - 52.0 %   MCV 87.0 78.0 - 100.0 fL   MCH 29.2 26.0 - 34.0 pg   MCHC 33.6 30.0 - 36.0 g/dL   RDW 13.3 11.5 - 15.5 %   Platelets 343 150 - 400 K/uL   Neutrophils Relative % 61 %   Lymphocytes Relative 27 %   Monocytes Relative 9 %   Eosinophils Relative 3 %   Basophils Relative 0 %   Neutro Abs 11.5 (H) 1.7 - 7.7 K/uL   Lymphs Abs 5.1 (H) 0.7 - 4.0 K/uL   Monocytes Absolute 1.7 (H) 0.1 - 1.0 K/uL   Eosinophils Absolute 0.6 0.0 - 0.7 K/uL   Basophils Absolute  0.0 0.0 - 0.1 K/uL   RBC Morphology STOMATOCYTES    WBC Morphology MILD LEFT SHIFT (1-5% METAS, OCC MYELO, OCC BANDS)     Comment: TOXIC GRANULATION  Magnesium     Status: None   Collection Time: 06/11/17  5:23 AM  Result Value Ref Range   Magnesium 1.8 1.7 - 2.4 mg/dL  Phosphorus     Status: None   Collection Time: 06/11/17  5:23 AM  Result Value Ref Range   Phosphorus 2.6 2.5 - 4.6 mg/dL  Basic metabolic panel     Status: Abnormal   Collection Time: 06/11/17  5:23 AM  Result Value Ref Range   Sodium 130 (L) 135 - 145 mmol/L   Potassium 3.6 3.5 - 5.1 mmol/L   Chloride 94 (L) 101 - 111 mmol/L   CO2 25 22 - 32 mmol/L   Glucose, Bld 328 (H) 65 - 99 mg/dL   BUN 17 6 - 20 mg/dL   Creatinine, Ser 1.04 0.61 - 1.24 mg/dL   Calcium 8.0 (L) 8.9 - 10.3 mg/dL   GFR calc non Af Amer >60 >60 mL/min   GFR calc Af Amer >60 >60 mL/min    Comment: (NOTE) The eGFR has been calculated using the CKD EPI equation. This calculation has not been validated in all clinical situations. eGFR's persistently <60 mL/min signify possible Chronic Kidney Disease.    Anion gap 11 5 - 15  Glucose, capillary     Status: Abnormal   Collection Time: 06/11/17  7:36 AM  Result Value Ref Range   Glucose-Capillary 324 (H) 65 - 99 mg/dL  Glucose, capillary     Status: Abnormal   Collection Time: 06/11/17 11:45 AM  Result Value Ref Range   Glucose-Capillary 193 (H) 65 - 99 mg/dL  Urinalysis, Routine w reflex microscopic     Status:  Abnormal   Collection Time: 06/11/17  2:17 PM  Result Value Ref Range   Color, Urine YELLOW YELLOW   APPearance CLEAR CLEAR   Specific Gravity, Urine 1.017 1.005 - 1.030   pH 7.0 5.0 - 8.0   Glucose, UA >=500 (A) NEGATIVE mg/dL   Hgb urine dipstick SMALL (A) NEGATIVE   Bilirubin Urine NEGATIVE NEGATIVE   Ketones, ur NEGATIVE NEGATIVE mg/dL   Protein, ur 100 (A) NEGATIVE mg/dL   Nitrite NEGATIVE NEGATIVE   Leukocytes, UA NEGATIVE NEGATIVE   RBC / HPF 6-30 0 - 5 RBC/hpf   WBC, UA 0-5 0 - 5 WBC/hpf   Bacteria, UA NONE SEEN NONE SEEN   Squamous Epithelial / LPF 0-5 (A) NONE SEEN  Glucose, capillary     Status: None   Collection Time: 06/11/17  4:43 PM  Result Value Ref Range   Glucose-Capillary 86 65 - 99 mg/dL  Glucose, capillary     Status: Abnormal   Collection Time: 06/11/17  9:03 PM  Result Value Ref Range   Glucose-Capillary 293 (H) 65 - 99 mg/dL  CBC with Differential/Platelet     Status: Abnormal   Collection Time: 06/12/17  5:59 AM  Result Value Ref Range   WBC 16.3 (H) 4.0 - 10.5 K/uL   RBC 3.56 (L) 4.22 - 5.81 MIL/uL   Hemoglobin 10.8 (L) 13.0 - 17.0 g/dL   HCT 30.6 (L) 39.0 - 52.0 %   MCV 86.0 78.0 - 100.0 fL   MCH 30.3 26.0 - 34.0 pg   MCHC 35.3 30.0 - 36.0 g/dL   RDW 13.4 11.5 - 15.5 %   Platelets 338 150 -  400 K/uL   Neutrophils Relative % 63 %   Lymphocytes Relative 26 %   Monocytes Relative 7 %   Eosinophils Relative 4 %   Basophils Relative 0 %   Neutro Abs 10.3 (H) 1.7 - 7.7 K/uL   Lymphs Abs 4.2 (H) 0.7 - 4.0 K/uL   Monocytes Absolute 1.1 (H) 0.1 - 1.0 K/uL   Eosinophils Absolute 0.7 0.0 - 0.7 K/uL   Basophils Absolute 0.0 0.0 - 0.1 K/uL   RBC Morphology STOMATOCYTES    WBC Morphology ATYPICAL LYMPHOCYTES     Comment: rare TOXIC GRANULATION   Magnesium     Status: Abnormal   Collection Time: 06/12/17  5:59 AM  Result Value Ref Range   Magnesium 1.6 (L) 1.7 - 2.4 mg/dL  Phosphorus     Status: None   Collection Time: 06/12/17  5:59 AM  Result  Value Ref Range   Phosphorus 2.9 2.5 - 4.6 mg/dL  Hepatic function panel     Status: Abnormal   Collection Time: 06/12/17  5:59 AM  Result Value Ref Range   Total Protein 7.7 6.5 - 8.1 g/dL   Albumin 2.0 (L) 3.5 - 5.0 g/dL   AST 68 (H) 15 - 41 U/L   ALT 48 17 - 63 U/L   Alkaline Phosphatase 93 38 - 126 U/L   Total Bilirubin 0.6 0.3 - 1.2 mg/dL   Bilirubin, Direct <0.1 (L) 0.1 - 0.5 mg/dL   Indirect Bilirubin NOT CALCULATED 0.3 - 0.9 mg/dL  Basic metabolic panel     Status: Abnormal   Collection Time: 06/12/17  5:59 AM  Result Value Ref Range   Sodium 127 (L) 135 - 145 mmol/L   Potassium 3.7 3.5 - 5.1 mmol/L   Chloride 96 (L) 101 - 111 mmol/L   CO2 24 22 - 32 mmol/L   Glucose, Bld 262 (H) 65 - 99 mg/dL   BUN 17 6 - 20 mg/dL   Creatinine, Ser 0.86 0.61 - 1.24 mg/dL   Calcium 8.2 (L) 8.9 - 10.3 mg/dL   GFR calc non Af Amer >60 >60 mL/min   GFR calc Af Amer >60 >60 mL/min    Comment: (NOTE) The eGFR has been calculated using the CKD EPI equation. This calculation has not been validated in all clinical situations. eGFR's persistently <60 mL/min signify possible Chronic Kidney Disease.    Anion gap 7 5 - 15  Glucose, capillary     Status: Abnormal   Collection Time: 06/12/17  7:37 AM  Result Value Ref Range   Glucose-Capillary 251 (H) 65 - 99 mg/dL   Comment 1 Document in Chart   Glucose, capillary     Status: Abnormal   Collection Time: 06/12/17 12:28 PM  Result Value Ref Range   Glucose-Capillary 155 (H) 65 - 99 mg/dL   Dg Chest 2 View  Result Date: 06/11/2017 CLINICAL DATA:  Weakness and headaches EXAM: CHEST  2 VIEW COMPARISON:  06/07/2017 FINDINGS: Endotracheal and NG tubes have been removed. Right jugular central venous catheter removed. Confluent extensive bilateral airspace disease with a central distribution has improved but there is considerable persistent disease. No pneumothorax. No pleural effusion. IMPRESSION: Improved but persistent bilateral airspace disease. The  patient is extubated. Electronically Signed   By: Marybelle Killings M.D.   On: 06/11/2017 07:54       Medical Problem List and Plan: 1.  Deficits with mobility and ability to complete ADLs secondary to debility. 2.  DVT Prophylaxis/Anticoagulation: Pharmaceutical: Lovenox 3. Pain  Management:continue Ultram prn.  Resume Gabapentin for pain bilateral hands and BLE. 4. Mood: LCSW to follow for evaluation and support.  5. Neuropsych: This patient is capable of making decisions on his own behalf. 6. Skin/Wound Care: Maintain adequate nutritional  and hydration status.  7. Fluids/Electrolytes/Nutrition: Monitor I/O--strict. Daily weights to monitor for signs of overload.  8. Sepsis due to aspiration PNA/strep viridans bacteremia: Antibiotics narrowed to Ceftriaxone on 01/8  9. Leucocytosis: Continue to monitor for signs of infection. PNA resolving--will order follow up CXR in am.  10. CAD with acute systolic CHF: Monitor weights daily. Lasix on hold due to AKI.  11. BLE ulcers: Due to trauma--on doxycycline. ABI's done today. 12.  RA: Recent diagnosis with addition of methotrexate last month. Avoid NSAIDs with AKI and recent MI.  Hold  Methotrexate.  13. T1DM with neuropathy: Used 70/30 insulin at home with Novolog SSI. Transition back to home regimen and titrate as indicated.  14. Abnormal LFTs/Recent electrolyte abnormalities: Due to GI issues and sepsis. Recheck in am. .  15. Hyponatremia: Progressive drop noted. Will start fluid restriction and monitor.  16. Sore throat/Cough: On Mucinex. Will add cepacol lozenges also. Question GERD--will add pepcid.   Post Admission Physician Evaluation: 1. Preadmission assessment reviewed and changes made below. 2. Functional deficits secondary  to debility. 3. Patient is admitted to receive collaborative, interdisciplinary care between the physiatrist, rehab nursing staff, and therapy team. 4. Patient's level of medical complexity and substantial therapy  needs in context of that medical necessity cannot be provided at a lesser intensity of care such as a SNF. 5. Patient has experienced substantial functional loss from his/her baseline which was documented above under the "Functional History" and "Functional Status" headings.  Judging by the patient's diagnosis, physical exam, and functional history, the patient has potential for functional progress which will result in measurable gains while on inpatient rehab.  These gains will be of substantial and practical use upon discharge  in facilitating mobility and self-care at the household level. 46. Physiatrist will provide 24 hour management of medical needs as well as oversight of the therapy plan/treatment and provide guidance as appropriate regarding the interaction of the two. 7. 24 hour rehab nursing will assist with safety, skin/wound care, disease management and patient education  and help integrate therapy concepts, techniques,education, etc. 8. PT will assess and treat for/with: Lower extremity strength, range of motion, stamina, balance, functional mobility, safety, adaptive techniques and equipment, woundcare, coping skills, pain control, education.   Goals are: Mod I. 9. OT will assess and treat for/with: ADL's, functional mobility, safety, upper extremity strength, adaptive techniques and equipment, wound mgt, ego support, and community reintegration.   Goals are: Mod I. Therapy may proceed with showering this patient. 10. Case Management and Social Worker will assess and treat for psychological issues and discharge planning. 11. Team conference will be held weekly to assess progress toward goals and to determine barriers to discharge. 12. Patient will receive at least 3 hours of therapy per day at least 5 days per week. 13. ELOS: 4-7 days.       14. Prognosis:  good  Delice Lesch, MD, ABPMR Bary Leriche, PA-C 06/12/2017

## 2017-06-12 NOTE — [place] FL2 (Signed)
[place] MEDICAID FL2 LEVEL OF CARE SCREENING TOOL     IDENTIFICATION  Patient Name: Matthew Riley Birthdate: [birth year] Sex: male Admission Date (Current Location): 06/03/2017  [facility] and [place] Number:  Producer, television/film/video and Address:  [facility], [address] [place], [place] [address]      Provider Number: [id number]  Attending Physician Name and Address:  Alba Cory, MD  Relative Name and Phone Number:  Hunt Zajicek - sister; [phone]    Current Level of Care: Hospital Recommended Level of Care: Skilled Nursing Facility Prior Approval Number:    Date Approved/Denied:   PASRR Number: [id number](Eff. 06/11/17)  Discharge Plan: SNF    Current Diagnoses: Patient Active Problem List   Diagnosis Date Noted  . Encephalopathy 06/03/2017  . Acute respiratory failure with hypoxia (HCC)   . Septic shock (HCC)   . Abdominal pain 04/16/2017  . Tobacco abuse 04/16/2017  . Leg wound, right 04/16/2017  . Bilateral leg edema 04/16/2017  . 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 CAD (coronary artery disease) 04/16/2017  . Hyperglycemia 04/16/2017  . Hypertension   . Uncontrolled type 1 diabetes mellitus with hyperglycemia (HCC)   . Neuropathy   . Nausea & vomiting 03/08/2017  . GERD (gastroesophageal reflux disease) 03/08/2017  . Hyponatremia 03/08/2017  . AKI (acute kidney injury) (HCC) 03/08/2017  . Hyperkalemia 03/08/2017  . DKA, type 1 (HCC)     Orientation RESPIRATION BLADDER Height & Weight     Self, Place  Normal Continent Weight: 204 lb 2.3 oz (92.6 kg) Height:  6\' 1"  (185.4 cm)  BEHAVIORAL SYMPTOMS/MOOD NEUROLOGICAL BOWEL NUTRITION STATUS      Incontinent Diet(Heart-healthy, Carb modified )  AMBULATORY STATUS COMMUNICATION OF NEEDS Skin   Extensive Assist Verbally Other (Comment)(Non-pressure wound lower posterior right leg; Non-pressure wound right lower posterior arm; Non pressure wound to distal-left lower  posterior leg; Non-pressure wound to distal, lower right medial leg; Excoriated leg-right, left posterior & anterior )                       Personal Care Assistance Level of Assistance  Bathing, Feeding, Dressing Bathing Assistance: Limited assistance Feeding assistance: Independent Dressing Assistance: Limited assistance     Functional Limitations Info  Sight, Hearing, Speech Sight Info: Adequate Hearing Info: Adequate Speech Info: Adequate    SPECIAL CARE FACTORS FREQUENCY  PT (By licensed PT)     PT Frequency: Evaluated 1/6 and a minimum of 3X per week recommended during acute inpatient staty              Contractures Contractures Info: Not present    Additional Factors Info  Code Status, Allergies, Insulin Sliding Scale Code Status Info: Full Allergies Info: No known allergies   Insulin Sliding Scale Info: 0-15 units 3X per day with meals       Current Medications (06/12/2017):  This is the current hospital active medication list Current Facility-Administered Medications  Medication Dose Route Frequency Provider Last Rate Last Dose  . 0.9 %  sodium chloride infusion  250 mL Intravenous PRN 08/10/2017, NP      . aspirin chewable tablet 81 mg  81 mg Per Tube Daily Tobey Grim, NP   81 mg at 06/11/17 0825  . cefTRIAXone (ROCEPHIN) 2 g in dextrose 5 % 50 mL IVPB - Premix  2 g Intravenous Q24H Regalado, Belkys A, MD   Stopped at 06/11/17 1838  . collagenase (SANTYL)  ointment   Topical Daily Regalado, Belkys A, MD   1 application at 06/11/17 1341  . doxycycline (VIBRA-TABS) tablet 100 mg  100 mg Oral Q12H Regalado, Belkys A, MD   100 mg at 06/11/17 2205  . feeding supplement (PRO-STAT SUGAR FREE 64) liquid 30 mL  30 mL Per Tube BID Lynnell Jude, MD   30 mL at 06/11/17 2205  . guaiFENesin (MUCINEX) 12 hr tablet 600 mg  600 mg Oral BID Regalado, Belkys A, MD   600 mg at 06/11/17 2205  . heparin injection 5,000 Units  5,000 Units Subcutaneous Q8H  Tobey Grim, NP   5,000 Units at 06/12/17 0616  . hydrALAZINE (APRESOLINE) injection 10-40 mg  10-40 mg Intravenous Q4H PRN Lupita Leash, MD   10 mg at 06/09/17 0610  . insulin aspart (novoLOG) injection 0-15 Units  0-15 Units Subcutaneous TID WC Lynnell Jude, MD   8 Units at 06/12/17 [phone]  . insulin aspart (novoLOG) injection 5 Units  5 Units Subcutaneous TID WC Regalado, Belkys A, MD   5 Units at 06/12/17 0848  . insulin glargine (LANTUS) injection 30 Units  30 Units Subcutaneous QHS Regalado, Belkys A, MD   30 Units at 06/11/17 2206  . metoprolol tartrate (LOPRESSOR) injection 2.5 mg  2.5 mg Intravenous Q3H PRN Karl Ito, MD   2.5 mg at 06/05/17 2039  . thiamine (VITAMIN B-1) tablet 100 mg  100 mg Oral Daily Regalado, Belkys A, MD   100 mg at 06/11/17 0825  . traMADol (ULTRAM) tablet 50 mg  50 mg Oral Q8H PRN Regalado, Belkys A, MD   50 mg at 06/11/17 2205     Discharge Medications: Please see discharge summary for a list of discharge medications.  Relevant Imaging Results:  Relevant Lab Results:   Additional Information ss#[id number].  Patient has skin tear to bilateral leg.  Cristobal Goldmann, LCSW

## 2017-06-12 NOTE — Progress Notes (Signed)
ABI's have been completed. Right 1.22 Left 1.2  06/12/17 1:41 PM [name] RVT

## 2017-06-12 NOTE — Progress Notes (Signed)
Physical Medicine and Rehabilitation Consult   Reason for Consult: Debility.  Referring Physician: Dr. Tyrell Antonio.    HPI: Matthew Riley is a 42 y.o. male with history of HTN, CKD stage III, chronic LBP, T1DM-- uncontrolled, CVA 02/2017 ( moved to [place] to be with family), CAD s/p stenting with DKA 04/2017 who was admitted on 06/03/17 after found unresponsive at home with evidence of aspiration. UDS positive for THC and benzos.  Patient with reports of N/V with diarrhea and was intubated due to decreased LOC and treated for sepsis with pressors and broad spectrum antibiotics.  CT chest/abdomen showed bilateral PNA.   One blood culture positive for strep viridans. CT abdomen repeated showing anasarca but no abscess or abnormality.  CT head done due to encephalopathy and negative for acute changes. He self extubated on tolerated extubation on 11/5 and PT evaluation showed debility. CIR recommended for follow up therapy.    Review of Systems  Constitutional: Negative for chills and fever.  HENT: Negative for hearing loss and tinnitus.   Eyes: Negative for blurred vision and double vision.  Respiratory: Positive for cough and sputum production. Negative for hemoptysis.   Cardiovascular: Negative for chest pain.  Gastrointestinal: Positive for abdominal pain. Negative for constipation, heartburn and nausea.  Genitourinary: Positive for dysuria.  Musculoskeletal: Positive for back pain. Negative for falls and myalgias.  Skin: Negative for itching and rash.  Neurological: Positive for sensory change (pain bilateral hands), weakness and headaches. Negative for dizziness.  Psychiatric/Behavioral: Positive for memory loss. The patient does not have insomnia.           Past Medical History:  Diagnosis Date  . Diabetes mellitus without complication ([place])   . Hypertension   . Renal disorder   . Stroke [facility])          Past Surgical History:  Procedure Laterality Date  . CORONARY  ANGIOPLASTY WITH STENT PLACEMENT      Family History  Family history unknown: Yes    Social History:  Single. 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 [facility] has been providing therapy since his move her. he shares an apartment with sister (works 12 hours day). Independent with walker PTA. He  reports that he has quit smoking--months abo. he has never used smokeless tobacco. He denies alcohol or marijuana.     Allergies: No Known Allergies             Facility-Administered Medications Prior to Admission  Medication Dose Route Frequency Provider Last Rate Last Dose  . sodium chloride 0.9 % bolus 1,000 mL  1,000 mL Intravenous Once Alfonse Spruce, FNP             Medications Prior to Admission  Medication Sig Dispense Refill  . aspirin EC 81 MG tablet Take 1 tablet (81 mg total) by mouth daily. 30 tablet 11  . cyclobenzaprine (FLEXERIL) 10 MG tablet Take 0.5 tablets (5 mg total) by mouth 3 (three) times daily as needed for muscle spasms. 30 tablet 1  . furosemide (LASIX) 20 MG tablet Take 1 tablet (20 mg total) by mouth daily. 30 tablet 0  . gabapentin (NEURONTIN) 300 MG capsule Take 1 capsule (300 mg total) by mouth 3 (three) times daily. 90 capsule 2  . ibuprofen (ADVIL,MOTRIN) 800 MG tablet Take 800 mg by mouth every 8 (eight) hours as needed for moderate pain.  0  . indomethacin (INDOCIN) 50 MG capsule Take 1 capsule (50 mg total) by  mouth 3 (three) times daily as needed for moderate pain. Take with a meal. 60 capsule 1  . insulin aspart (NOVOLOG) 100 UNIT/ML injection Inject 5 Units into the skin 3 (three) times daily with meals. 10 mL 3  . insulin aspart protamine- aspart (NOVOLOG MIX 70/30) (70-30) 100 UNIT/ML injection Inject 0.38 mLs (38 Units total) into the skin 2 (two) times daily with a meal. 30 mL 8  . lisinopril (PRINIVIL,ZESTRIL) 40 MG tablet Take 1 tablet (40 mg total) by mouth daily. 30 tablet 2  . methotrexate  (RHEUMATREX) 2.5 MG tablet Take 3 tablets (7.5 mg total) by mouth once a week. Caution:Chemotherapy. Protect from light. 24 tablet 0  . mupirocin ointment (BACTROBAN) 2 % Apply 1 application topically 2 (two) times daily. For 7 days. 22 g 0  . ondansetron (ZOFRAN) 4 MG tablet Take 1 tablet (4 mg total) by mouth every 8 (eight) hours as needed for nausea. 20 tablet 0  . pantoprazole (PROTONIX) 40 MG tablet Take 1 tablet (40 mg total) by mouth daily. 30 tablet 1  . blood glucose meter kit and supplies Dispense based on patient and insurance preference. Use up to four times daily as directed. (FOR ICD-9 250.00, 250.01). 1 each 0  . traMADol (ULTRAM) 50 MG tablet Take 1 tablet (50 mg total) by mouth every 8 (eight) hours as needed for severe pain. 30 tablet 0    Home: Home Living Family/patient expects to be discharged to:: Private residence Living Arrangements: Other relatives(sister) Available Help at Discharge: Family, Available PRN/intermittently Type of Home: House Home Access: Level entry Home Layout: Two level Alternate Level Stairs-Number of Steps: 16 Alternate Level Stairs-Rails: Left Bathroom Shower/Tub: Chiropodist: Standard Home Equipment: Walker - 2 wheels  Functional History: Prior Function Level of Independence: Independent Comments: independent prior to 04/2017 admission. Pt has been utilizing RW following that admission and receiving HHPT.  Functional Status:  Mobility: Bed Mobility Overal bed mobility: Needs Assistance Bed Mobility: Supine to Sit, Sit to Supine Supine to sit: Mod assist Sit to supine: Mod assist General bed mobility comments: +rail, verbal cues for sequencing Transfers Overall transfer level: Needs assistance Equipment used: Rolling walker (2 wheeled) Transfers: Sit to/from Stand Sit to Stand: Mod assist General transfer comment: multi attempts to power up, verbal cues for hand placement Ambulation/Gait Ambulation/Gait  assistance: Mod assist Ambulation Distance (Feet): 5 Feet Assistive device: Rolling walker (2 wheeled) Gait Pattern/deviations: Step-through pattern, Decreased stride length General Gait Details: unsteady, assist to advance RW Gait velocity: decreased Gait velocity interpretation: Below normal speed for age/gender  ADL:  Cognition: Cognition Overall Cognitive Status: Within Functional Limits for tasks assessed Orientation Level: Oriented to person, Oriented to place, Disoriented to time, Disoriented to situation Cognition Arousal/Alertness: Awake/alert Behavior During Therapy: Flat affect Overall Cognitive Status: Within Functional Limits for tasks assessed  Blood pressure (!) 153/104, pulse 91, temperature 97.8 F (36.6 C), resp. rate 18, height '6\' 1"'  (1.854 m), weight 95 kg (209 lb 7 oz), SpO2 100 %. Physical Exam  Nursing note reviewed. Constitutional: He is oriented to person, place, and time. He appears well-developed and well-nourished. No distress.  HENT:  Head: Normocephalic and atraumatic.  Mouth/Throat: Oropharynx is clear and moist.  Eyes: Conjunctivae are normal. Pupils are equal, round, and reactive to light.  Neck: Normal range of motion. Neck supple.  Cardiovascular: Normal rate and regular rhythm.  Respiratory: Effort normal and breath sounds normal. No stridor. No respiratory distress. He has no wheezes.  GI:  Soft. Bowel sounds are normal. He exhibits no distension.  Musculoskeletal: He exhibits edema.  Multiple scabs bilateral shins in different phases of healing. Foam dressing right heel.   Neurological: He is alert and oriented to person, place, and time.  Patient moves all 4 extremities.  Strength grossly 3+ to 4 out of 5 bilateral upper limbs and 3-4- bilateral lower extremities.  Senses pain and light touch in both limbs.  Skin: Skin is warm and dry. He is not diaphoretic.  Abrasion right ankle/heel  Psychiatric: His affect is blunt. He is slowed.    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 Does the need for close, 24 hr/day medical supervision in concert with the patient's rehab needs make it unreasonable for this patient to be served in a less intensive setting? Yes 2. Co-Morbidities requiring supervision/potential complications: Diabetes, coronary artery disease, hypertension, numerous wounds 3. Due to bladder management, bowel management, safety, skin/wound care, disease management, medication administration, pain management and patient education, does the patient require 24 hr/day rehab nursing? Yes 4. Does the patient require coordinated care of a physician, rehab nurse, PT (1-2 hrs/day, 5 days/week), OT (1-2 hrs/day, 5 days/week) and SLP (1-2 hrs/day, 5 days/week) to address physical and functional deficits in the context of the above medical diagnosis(es)? Yes Addressing deficits in the following areas: balance, endurance, locomotion, strength, transferring, bowel/bladder control, bathing, dressing, feeding, grooming, toileting, cognition and psychosocial support 5. Can the patient actively participate in an intensive therapy program of at least 3 hrs of therapy per day at least 5 days per week? Yes 6. The potential for patient to make measurable gains while on inpatient rehab is excellent 7. Anticipated functional outcomes upon discharge from inpatient rehab are modified independent  with PT, modified independent with OT, modified independent with SLP. 8. Estimated rehab length of stay to reach the above functional goals is: 11-14 days 9. Anticipated D/C setting: Home 10. Anticipated post D/C treatments: HH therapy and Outpatient therapy 11. Overall Rehab/Functional Prognosis: excellent  RECOMMENDATIONS: This patient's condition is appropriate for continued rehabilitative care in the following setting: CIR Patient has agreed to participate in recommended program. Yes Note that insurance prior  authorization may be required for reimbursement for recommended care.  Comment: Rehab Admissions Coordinator to follow up.  Thanks,  Meredith Staggers, MD, Mellody Drown    Bary Leriche, PA-C 06/10/2017          Revision History                        Routing History

## 2017-06-12 NOTE — IPOC Note (Signed)
Overall Plan of Care [facility]) Patient Details Name: Matthew Riley MRN: [id number] DOB: 1975-10-07  Admitting Diagnosis: Debility  Hospital Problems: Active Problems:   Debility   Acute systolic heart failure (HCC)   Labile blood glucose   Hypoglycemia     Functional Problem List: Nursing Bowel, Endurance, Medication Management, Motor, Nutrition, Pain, Safety, Skin Integrity  PT Balance, Endurance, Motor, Pain, Safety, Sensory, Skin Integrity  OT Balance, Endurance, Motor  SLP    TR         Basic ADL's: OT Grooming, Dressing, Toileting     Advanced  ADL's: OT Simple Meal Preparation     Transfers: PT Bed Mobility, Bed to Chair, Car, Furniture, Floor  OT Toilet, Research scientist (life sciences): PT Ambulation, Psychologist, prison and probation services, Stairs     Additional Impairments: OT None  SLP        TR      Anticipated Outcomes Item Anticipated Outcome  Self Feeding mod independent  Swallowing      Basic self-care  mod independent  Toileting  mod independent   Bathroom Transfers mod independent  Bowel/Bladder  Mod I assist  Transfers  Mod I with LRAD   Locomotion  ambulatory at mod I with LRAD  Communication     Cognition     Pain  < 4  Safety/Judgment  Mod I assist   Therapy Plan: PT Intensity: Minimum of 1-2 x/day ,45 to 90 minutes PT Frequency: 5 out of 7 days PT Duration Estimated Length of Stay: 10-14 days  OT Intensity: Minimum of 1-2 x/day, 45 to 90 minutes OT Frequency: 5 out of 7 days OT Duration/Estimated Length of Stay: 10-12 days       Team Interventions: Nursing Interventions Patient/Family Education, Pain Management, Medication Management, Discharge Planning, Bowel Management, Skin Care/Wound Management, Disease Management/Prevention, Cognitive Remediation/Compensation  PT interventions Ambulation/gait training, Warden/ranger, Community reintegration, Discharge planning, DME/adaptive equipment instruction, Disease  management/prevention, Functional electrical stimulation, Functional mobility training, Neuromuscular re-education, Pain management, Patient/family education, Psychosocial support, Skin care/wound management, Therapeutic Exercise, Splinting/orthotics, Stair training, Therapeutic Activities, UE/LE Coordination activities, UE/LE Strength taining/ROM, Visual/perceptual remediation/compensation, Wheelchair propulsion/positioning  OT Interventions Warden/ranger, Community reintegration, Disease mangement/prevention, Equities trader education, Self Care/advanced ADL retraining, Therapeutic Exercise, UE/LE Coordination activities, Discharge planning, DME/adaptive equipment instruction, Functional mobility training, Psychosocial support, Therapeutic Activities, UE/LE Strength taining/ROM  SLP Interventions    TR Interventions    SW/CM Interventions Discharge Planning, Psychosocial Support, Patient/Family Education   Barriers to Discharge MD  Medical stability  Nursing Decreased caregiver support, Incontinence, Medication compliance, Wound Care    PT Inaccessible home environment, Decreased caregiver support    OT      SLP      SW       Team Discharge Planning: Destination: PT-Home ,OT- Home , SLP-  Projected Follow-up: PT-Home health PT, OT-  Home health OT, SLP-  Projected Equipment Needs: PT-To be determined, OT- To be determined, SLP-  Equipment Details: PT- , OT-suspect may need TTB  Patient/family involved in discharge planning: PT- Patient,  OT-Patient, SLP-   MD ELOS: 7-11 days. Medical Rehab Prognosis:  Good Assessment: 42 y.o.malewith history of HTN, CKD stage III, chronic LBP, T1DM-- uncontrolled,CVA 02/2017 (moved to [place] to be with family),CAD s/p stenting with DKA 04/2017 who was admitted on 06/03/17 after found unresponsive at home with evidence of aspiration. UDS positive for THC and benzos. Patient with reports of N/V with diarrhea and was intubated due to decreased  LOC and treated for sepsis with  pressors and broad spectrum antibiotics. CT chest/abdomen showed diffuse bilateral PNA. One blood culture positive for strep viridans. CT abdomen repeated showing anasarca but no abscess or abnormality. CT head reviewed, negative for acute process. He self extubated on tolerated extubation on 11/5 and respiratory status stable. Hypotension treated with fluid boluses. Electrolyte abnormality resolving and antibiotics narrowed to ceftriaxone on 01/8. BLE wounds treated with local measures as well as doxycyline. Pt with resulting functional deficits with mobility, self-care, and endurance.  Will set goals for Mod I with PT/OT.    See Team Conference Notes for weekly updates to the plan of care

## 2017-06-12 NOTE — Discharge Summary (Addendum)
Physician Discharge Summary  Matthew Riley EGB:[id number] DOB: 06/18/1975 DOA: 06/03/2017  PCP: Alfonse Spruce, FNP  Admit date: 06/03/2017 Discharge date: 06/12/2017  Admitted From: Home  Disposition: CIR  Recommendations for Outpatient Follow-up:  1. Follow up with PCP in 1-2 weeks 2. Please obtain BMP/CBC in one week 3. Please follow up on the following pending results:    Discharge Condition: Stable.  CODE STATUS: Full code.  Diet recommendation: Carb Modified   Brief/Interim Summary: Brief Narrative: 42 year old male with PMH of DM, HTN, CAD s/p stent placement, GERD, and Tobacco abuse. Admission 04/2017 for DKA. Lives with sister. Using home health services.  Presented to ED on 12/31 after being found down in bathroom unresponsive covered in emesis and diarrhea. Upon arrival to ED patient hypotensive and hypoxic, requiring intubated. LA 5.07. Given Vancomycin/Zoysn and 3L NS. AST/ALT 324/233, Crt 2.34. PCCM asked to admit.   Blood has grown strep viridans in one bottle. Repeat cultures remain negative. . Echo 12/31 with poor windows, Ef 35%. Repeat echo 1/2 shows no vegetations and a little better Ef at 40-45%. Mixed flora on sputum gram stain. A right upper quadrant ultrasound to evaluate his transaminitis as well as elevated amylase and lipase did not show overt obstruction.  Seizure like activity followed by a prolonged period of decreased responsiveness on 1/3. No further seizure like activity and no epileptiform activity on spot EEG. Patient self  extubated 1-05   Assessment & Plan:   Active Problems:   Encephalopathy   Acute respiratory failure with hypoxia (HCC)   Septic shock (HCC)  1-Strep viridans Bacteremia, one of two; PNA aspiration.  Repeat chest x ray improved but persistent bilateral airspace diseases.  Day 8 zosyn. Change to ceftriaxone for 1 more days.  Repeated blood culture negative.   Leukocytosis; on IV antibiotics. Follow trend.   Continue to increase. Chest x ray with improved infiltrates.  He denies diarrhea.  Repeat labs in am.  He has bilateral LE abrasion, wound, wound care consulted. Added doxy.  CT abdomen and pelvis; 1-04; anasarca, unremarkable.  Repeat LFT in am. trending down.  Trending down.   2-Acute hypoxemic resp failure - s/p VDRF - cause infiltrates due to aspiration/infection v acute systolic CHF v some combo IV antibiotics, narrow to ceftriaxone. One more day Repeat chest x ray. Improved airspace diseases.  Oxygen sat 95 RA.  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  Initial ECHO lower EF Troponin normal.  History CAD Stent.  Needs follow up with cardiology.  hold lasix due to hyponatremia. Follow Sodium level.   4-Hypomagnesemia; replete IV Hypokalemia ; resolved.   5-DM; increase lantus 30 units. Continue with meals coverage.  Hypoglycemia this afternoon, will hold meals coverage, decrease lantus night dose to 25, might need to hold it depending on subsequent blood sugar.  Will repeat blood sugar prior to transfer.   Transaminases; related to infection. Improving , trending down.   Acute encephalopathy;  Related to acute illness.  Improved.   Sepsis; PNA, aspiration,  treated with IV antibiotics, pressors.  Resolved.   Ulcer to Left Lower Extremity > wound care consult.  Added  Doxy.   Questionable Seizure activity while intubated 12-31;  CT head. negative EEG; negative for seizure.  Monitor for further seizure.   Diarrhea; c diff negative.   RA; resume tramadol. Resume methotrexate when infection resolved.   B/L lower extremity wound.; He report trauma;  But check ABI.  Wound care consulted.  Doxy for 5 days.   Discharge Diagnoses:  Active Problems:   Encephalopathy   Acute respiratory failure with hypoxia (HCC)   Septic shock [facility])    Discharge Instructions  Discharge Instructions    Diet - low sodium heart healthy   Complete by:   As directed    Increase activity slowly   Complete by:  As directed      Allergies as of 06/12/2017   No Known Allergies     Medication List    STOP taking these medications   cyclobenzaprine 10 MG tablet Commonly known as:  FLEXERIL   furosemide 20 MG tablet Commonly known as:  LASIX   gabapentin 300 MG capsule Commonly known as:  NEURONTIN   ibuprofen 800 MG tablet Commonly known as:  ADVIL,MOTRIN   indomethacin 50 MG capsule Commonly known as:  INDOCIN   insulin aspart protamine- aspart (70-30) 100 UNIT/ML injection Commonly known as:  NOVOLOG MIX 70/30   lisinopril 40 MG tablet Commonly known as:  PRINIVIL,ZESTRIL   methotrexate 2.5 MG tablet Commonly known as:  RHEUMATREX     TAKE these medications   aspirin EC 81 MG tablet Take 1 tablet (81 mg total) by mouth daily.   blood glucose meter kit and supplies Dispense based on patient and insurance preference. Use up to four times daily as directed. (FOR ICD-9 250.00, 250.01).   cefTRIAXone 40 MG/ML IVPB Commonly known as:  ROCEPHIN Inject 50 mLs (2 g total) into the vein daily.   doxycycline 100 MG tablet Commonly known as:  VIBRA-TABS Take 1 tablet (100 mg total) by mouth every 12 (twelve) hours.   guaiFENesin 600 MG 12 hr tablet Commonly known as:  MUCINEX Take 1 tablet (600 mg total) by mouth 2 (two) times daily.   insulin aspart 100 UNIT/ML injection Commonly known as:  novoLOG Inject 5 Units into the skin 3 (three) times daily with meals.   insulin glargine 100 UNIT/ML injection Commonly known as:  LANTUS Inject 0.3 mLs (30 Units total) into the skin at bedtime.   mupirocin ointment 2 % Commonly known as:  BACTROBAN Apply 1 application topically 2 (two) times daily. For 7 days.   ondansetron 4 MG tablet Commonly known as:  ZOFRAN Take 1 tablet (4 mg total) by mouth every 8 (eight) hours as needed for nausea.   pantoprazole 40 MG tablet Commonly known as:  PROTONIX Take 1 tablet (40 mg  total) by mouth daily.   thiamine 100 MG tablet Take 1 tablet (100 mg total) by mouth daily. Start taking on:  06/13/2017   traMADol 50 MG tablet Commonly known as:  ULTRAM Take 1 tablet (50 mg total) by mouth every 8 (eight) hours as needed for severe pain.      Follow-up Information    Alfonse Spruce, FNP.   Specialty:  Family Medicine Contact information: [place] [address] [phone]          No Known Allergies  Consultations:  CCM admitted patient.    Procedures/Studies: Ct Abdomen Pelvis Wo Contrast  Result Date: 06/03/2017 CLINICAL DATA:  Acute onset of shortness of breath, generalized abdominal pain, nausea and vomiting. EXAM: CT CHEST, ABDOMEN AND PELVIS WITHOUT CONTRAST TECHNIQUE: Multidetector CT imaging of the chest, abdomen and pelvis was performed following the standard protocol without IV contrast. COMPARISON:  CT of the abdomen and pelvis from 03/08/2017, and chest radiograph performed earlier today at 1:45 a.m. FINDINGS: CT CHEST FINDINGS Cardiovascular: The heart is  normal in size, but difficult to assess without contrast. The thoracic aorta is unremarkable. The great vessels are within normal limits. Mediastinum/Nodes: The mediastinum is grossly unremarkable in appearance. No mediastinal lymphadenopathy is seen, though evaluation for lymphadenopathy is limited without contrast. No pericardial effusion is identified. The visualized portions of the thyroid gland are unremarkable. No axillary lymphadenopathy is seen. The patient's endotracheal tube is seen ending 1-2 cm above the carina. The patient's enteric tube is seen ending at the body of the stomach. Lungs/Pleura: Dense central bilateral airspace opacification is noted, with air bronchograms, compatible with diffuse bilateral pneumonia. No pleural effusion or pneumothorax is seen. Musculoskeletal: No acute osseous abnormalities are identified. The visualized musculature is  unremarkable in appearance. CT ABDOMEN PELVIS FINDINGS Hepatobiliary: The liver is unremarkable in appearance. The gallbladder is unremarkable in appearance. The common bile duct remains normal in caliber. Pancreas: The pancreas is within normal limits. Spleen: The spleen is unremarkable in appearance. Adrenals/Urinary Tract: The adrenal glands are unremarkable in appearance. The kidneys are within normal limits. There is no evidence of hydronephrosis. No renal or ureteral stones are identified. No perinephric stranding is seen. Stomach/Bowel: The stomach is unremarkable in appearance. The small bowel is within normal limits. The appendix is normal in caliber, without evidence of appendicitis. The colon is unremarkable in appearance. Vascular/Lymphatic: Minimal calcification is seen along the abdominal aorta. The abdominal aorta is otherwise grossly unremarkable. The inferior vena cava is grossly unremarkable. Evaluation for retroperitoneal lymphadenopathy is markedly suboptimal without contrast. No definite pelvic sidewall lymphadenopathy is seen. Reproductive: The bladder is significantly distended and grossly unremarkable. The prostate remains normal in size. Other: No additional soft tissue abnormalities are seen. Musculoskeletal: No acute osseous abnormalities are identified. The visualized musculature is unremarkable in appearance. IMPRESSION: 1. Dense bilateral pneumonia noted, worsened from recent prior studies. Per clinical correlation, this likely reflects aspiration. 2. Endotracheal tube seen ending 1-2 cm above the carina. This could be retracted 2 cm. 3. No acute abnormality seen within the abdomen or pelvis, though evaluation is limited without contrast. These results were called by telephone at the time of interpretation on 06/03/2017 at 4:27 am to Dr. Jola Schmidt, who verbally acknowledged these results. Electronically Signed   By: Garald Balding M.D.   On: 06/03/2017 04:27   Dg Chest 2  View  Result Date: 06/11/2017 CLINICAL DATA:  Weakness and headaches EXAM: CHEST  2 VIEW COMPARISON:  06/07/2017 FINDINGS: Endotracheal and NG tubes have been removed. Right jugular central venous catheter removed. Confluent extensive bilateral airspace disease with a central distribution has improved but there is considerable persistent disease. No pneumothorax. No pleural effusion. IMPRESSION: Improved but persistent bilateral airspace disease. The patient is extubated. Electronically Signed   By: Marybelle Killings M.D.   On: 06/11/2017 07:54   Ct Head Wo Contrast  Result Date: 06/06/2017 CLINICAL DATA:  42 y/o  M; intubated, possible seizure. EXAM: CT HEAD WITHOUT CONTRAST TECHNIQUE: Contiguous axial images were obtained from the base of the skull through the vertex without intravenous contrast. COMPARISON:  06/03/2017 CT head FINDINGS: Brain: No evidence of acute infarction, hemorrhage, hydrocephalus, extra-axial collection or mass lesion/mass effect. Vascular: No hyperdense vessel or unexpected calcification. Skull: Normal. Negative for fracture or focal lesion. Sinuses/Orbits: Mild right maxillary sinus mucosal thickening and trace opacification of mastoid air cells. Otherwise negative. Other: None. IMPRESSION: Negative stable CT of the head. Electronically Signed   By: Kristine Garbe M.D.   On: 06/06/2017 14:57   Ct Head Wo Contrast  Result Date: 06/03/2017 CLINICAL DATA:  Loss of consciousness following a syncopal episode. History of stroke, hypertension, diabetes. EXAM: CT HEAD WITHOUT CONTRAST TECHNIQUE: Contiguous axial images were obtained from the base of the skull through the vertex without intravenous contrast. COMPARISON:  None. FINDINGS: Brain: No evidence of acute infarction, hemorrhage, hydrocephalus, extra-axial collection or mass lesion/mass effect. Vascular: No hyperdense vessel or unexpected calcification. Skull: Normal. Negative for fracture or focal lesion. Sinuses/Orbits:  Mucosal thickening and small retention cysts in the right maxillary antrum. No acute air-fluid levels. Mastoid air cells are not opacified. Other: None. IMPRESSION: No acute intracranial abnormalities. Electronically Signed   By: Lucienne Capers M.D.   On: 06/03/2017 03:53   Ct Chest Wo Contrast  Result Date: 06/03/2017 CLINICAL DATA:  Acute onset of shortness of breath, generalized abdominal pain, nausea and vomiting. EXAM: CT CHEST, ABDOMEN AND PELVIS WITHOUT CONTRAST TECHNIQUE: Multidetector CT imaging of the chest, abdomen and pelvis was performed following the standard protocol without IV contrast. COMPARISON:  CT of the abdomen and pelvis from 03/08/2017, and chest radiograph performed earlier today at 1:45 a.m. FINDINGS: CT CHEST FINDINGS Cardiovascular: The heart is normal in size, but difficult to assess without contrast. The thoracic aorta is unremarkable. The great vessels are within normal limits. Mediastinum/Nodes: The mediastinum is grossly unremarkable in appearance. No mediastinal lymphadenopathy is seen, though evaluation for lymphadenopathy is limited without contrast. No pericardial effusion is identified. The visualized portions of the thyroid gland are unremarkable. No axillary lymphadenopathy is seen. The patient's endotracheal tube is seen ending 1-2 cm above the carina. The patient's enteric tube is seen ending at the body of the stomach. Lungs/Pleura: Dense central bilateral airspace opacification is noted, with air bronchograms, compatible with diffuse bilateral pneumonia. No pleural effusion or pneumothorax is seen. Musculoskeletal: No acute osseous abnormalities are identified. The visualized musculature is unremarkable in appearance. CT ABDOMEN PELVIS FINDINGS Hepatobiliary: The liver is unremarkable in appearance. The gallbladder is unremarkable in appearance. The common bile duct remains normal in caliber. Pancreas: The pancreas is within normal limits. Spleen: The spleen is  unremarkable in appearance. Adrenals/Urinary Tract: The adrenal glands are unremarkable in appearance. The kidneys are within normal limits. There is no evidence of hydronephrosis. No renal or ureteral stones are identified. No perinephric stranding is seen. Stomach/Bowel: The stomach is unremarkable in appearance. The small bowel is within normal limits. The appendix is normal in caliber, without evidence of appendicitis. The colon is unremarkable in appearance. Vascular/Lymphatic: Minimal calcification is seen along the abdominal aorta. The abdominal aorta is otherwise grossly unremarkable. The inferior vena cava is grossly unremarkable. Evaluation for retroperitoneal lymphadenopathy is markedly suboptimal without contrast. No definite pelvic sidewall lymphadenopathy is seen. Reproductive: The bladder is significantly distended and grossly unremarkable. The prostate remains normal in size. Other: No additional soft tissue abnormalities are seen. Musculoskeletal: No acute osseous abnormalities are identified. The visualized musculature is unremarkable in appearance. IMPRESSION: 1. Dense bilateral pneumonia noted, worsened from recent prior studies. Per clinical correlation, this likely reflects aspiration. 2. Endotracheal tube seen ending 1-2 cm above the carina. This could be retracted 2 cm. 3. No acute abnormality seen within the abdomen or pelvis, though evaluation is limited without contrast. These results were called by telephone at the time of interpretation on 06/03/2017 at 4:27 am to Dr. Jola Schmidt, who verbally acknowledged these results. Electronically Signed   By: Garald Balding M.D.   On: 06/03/2017 04:27   Ct Abdomen Pelvis W Contrast  Result Date: 06/07/2017 CLINICAL  DATA:  Patient in DKA on 12/31, unable to wean off the ventilator follow up sepsis extremities edema.^148m ISOVUE-300 IOPAMIDOL (ISOVUE-300) INJECTION 61% EXAM: CT ABDOMEN AND PELVIS WITH CONTRAST TECHNIQUE: Multidetector CT imaging  of the abdomen and pelvis was performed using the standard protocol following bolus administration of intravenous contrast. CONTRAST:  1076mISOVUE-300 IOPAMIDOL (ISOVUE-300) INJECTION 61% COMPARISON:  CT chest, abdomen and pelvis dated 06/03/2017. FINDINGS: Lower chest: Persistent perihilar and bibasilar consolidations, perhaps mildly improved compared to the earlier chest CT. Hepatobiliary: No focal liver abnormality is seen. No gallstones, gallbladder wall thickening, or biliary dilatation. Pancreas: Unremarkable. No pancreatic ductal dilatation or surrounding inflammatory changes. Spleen: Normal in size without focal abnormality. Adrenals/Urinary Tract: Adrenal glands appear normal. Kidneys are unremarkable without mass, stone or hydronephrosis. Bladder is decompressed by Foley catheter. Stomach/Bowel: No dilated large or small bowel loops. NG tube in place. Rectal tube in place. Vascular/Lymphatic: No significant vascular findings are present. No enlarged abdominal or pelvic lymph nodes. Reproductive: Prostate is unremarkable. Other: Mild fluid stranding within the bilateral pericolic gutters and lower pelvis. No circumscribed fluid collection or abscess like collection identified. No free intraperitoneal air seen. Musculoskeletal: No acute or suspicious osseous finding. Ill-defined fluid/edema within the subcutaneous soft tissues indicating anasarca. IMPRESSION: 1. Anasarca within the superficial soft tissues. Mild fluid stranding within the paracolic gutters and lower pelvis. No circumscribed fluid collection or abscess like collection identified. 2. Persistent perihilar and bibasilar consolidations, incompletely imaged, perhaps mildly improved compared to the chest CT of 06/03/2017. 3. Remainder of the abdomen and pelvis CT is unremarkable. No bowel obstruction. No evidence of acute solid organ abnormality. Electronically Signed   By: StFranki Cabot.D.   On: 06/07/2017 16:39   Dg Chest Port 1  View  Result Date: 06/07/2017 CLINICAL DATA:  Hypoxemia. EXAM: PORTABLE CHEST 1 VIEW COMPARISON:  06/04/2017 FINDINGS: Stable positioning of support apparatus. Cardiomediastinal silhouette is normal. Mediastinal contours appear intact. There is no evidence of pneumothorax. Mild improvement in bilateral dense airspace opacities with central predominance. Osseous structures are without acute abnormality. Soft tissues are grossly normal. IMPRESSION: Mild improvement in bilateral dense multi lobar airspace consolidation. Stable support apparatus. Electronically Signed   By: DoFidela Salisbury.D.   On: 06/07/2017 08:41   Dg Chest Port 1 View  Result Date: 06/04/2017 CLINICAL DATA:  Respiratory distress EXAM: PORTABLE CHEST 1 VIEW COMPARISON:  06/03/2017 FINDINGS: Endotracheal tube, nasogastric catheter and right jugular central line are again seen. Patchy perihilar consolidation is noted, stable in appearance. No new focal abnormality is noted. No bony abnormality is seen. IMPRESSION: Stable bilateral consolidation. Electronically Signed   By: MaInez Catalina.D.   On: 06/04/2017 08:00   Dg Chest Port 1 View  Result Date: 06/03/2017 CLINICAL DATA:  Central line placement. EXAM: PORTABLE CHEST 1 VIEW COMPARISON:  Chest radiograph and CT 06/03/2017 FINDINGS: Endotracheal tube terminates approximately 2 cm above the carina. A new right jugular catheter terminates over the mid to lower SVC. Enteric tube courses into the left upper abdomen with tip not imaged. The cardiac silhouette is normal in size. Dense bilateral airspace consolidation is greatest in the perihilar regions and has slightly worsened from the prior radiograph. No sizable pleural effusion or pneumothorax is identified. IMPRESSION: 1. New right jugular catheter terminates over the SVC. 2. Bilateral airspace consolidation compatible with the clinical diagnosis of aspiration pneumonia. Electronically Signed   By: AlLogan Bores.D.   On: 06/03/2017  07:36   Dg Chest Portable 1 View  Result Date: 06/03/2017 CLINICAL DATA:  Post intubation. History of diabetes, hypertension, stroke EXAM: PORTABLE CHEST 1 VIEW COMPARISON:  06/03/2017 FINDINGS: Interval placement of an endotracheal tube with tip measuring 3.3 cm above the carina. An enteric tube is been placed. Tip is off the field of view but below the left hemidiaphragm. Shallow inspiration. Heart size and pulmonary vascularity are normal. There is perihilar airspace disease progressing since previous study. This may represent multifocal pneumonia or less likely edema. No pneumothorax. No blunting of costophrenic angles. IMPRESSION: Appliances appear in satisfactory position. Increasing bilateral perihilar airspace disease since previous study. Electronically Signed   By: Lucienne Capers M.D.   On: 06/03/2017 02:09   Dg Chest Portable 1 View  Result Date: 06/03/2017 CLINICAL DATA:  Acute onset of shortness of breath and vomiting. Tachycardia. EXAM: PORTABLE CHEST 1 VIEW COMPARISON:  Chest radiograph performed 03/22/2017 FINDINGS: The lungs are well-aerated. Bilateral airspace opacities may reflect pneumonia or possibly pulmonary edema. There is no evidence of pleural effusion or pneumothorax. The cardiomediastinal silhouette is within normal limits. No acute osseous abnormalities are seen. IMPRESSION: Bilateral airspace opacities may reflect pneumonia or possibly pulmonary edema. Electronically Signed   By: Garald Balding M.D.   On: 06/03/2017 01:31   Dg Abd Portable 1v  Result Date: 06/05/2017 CLINICAL DATA:  Encounter for OG tube placement EXAM: PORTABLE ABDOMEN - 1 VIEW COMPARISON:  06/03/2017 FINDINGS: The enteric tube tip is in the body of the stomach with side port below GE junction. The bowel gas pattern appears nonobstructed. No dilated loops of small or large bowel. IMPRESSION: 1. Enteric tube tip within body of stomach. Electronically Signed   By: Kerby Moors M.D.   On: 06/05/2017 09:59    US Abdomen Limited Ruq  Result Date: 06/03/2017 CLINICAL DATA:  Nausea and vomiting for the past days EXAM: ULTRASOUND ABDOMEN LIMITED RIGHT UPPER QUADRANT COMPARISON:  Contrast abdominal and pelvic CT scan of today's date FINDINGS: Gallbladder: The gallbladder is mildly distended. There is a small amount of pericholecystic fluid. The wall is mildly thickened at 5.7 mm. No stones or sludge are observed. There is no positive sonographic Murphy's sign. Common bile duct: Diameter: 4 mm Liver: The hepatic echotexture is normal. The surface contour is smooth. There is no focal mass nor ductal dilation.No focal lesion identified. Within normal limits in parenchymal echogenicity. Portal vein is patent on color Doppler imaging with normal direction of blood flow towards the liver. IMPRESSION: Mild gallbladder wall thickening with pericholecystic fluid. This may reflect acalculous cholecystitis. No stones, sludge, or positive sonographic Murphy's sign are observed. Normal appearance of the liver and common bile duct. Electronically Signed   By: David  Martinique M.D.   On: 06/03/2017 11:59      Subjective: He relates cough has improved.  He is ready for CIR>   Discharge Exam: Vitals:   06/12/17 0431 06/12/17 0926  BP: (!) 140/93 (!) 128/94  Pulse: 85 87  Resp: 18 18  Temp: 98.2 F (36.8 C) 98.3 F (36.8 C)  SpO2: 95% 93%   Vitals:   06/11/17 1615 06/11/17 2106 06/12/17 0431 06/12/17 0926  BP: (!) 145/101 (!) 156/106 (!) 140/93 (!) 128/94  Pulse: 87 92 85 87  Resp: _0 Temp: 98.1 F (36.7 C) 98 F (36.7 C) 98.2 F (36.8 C) 98.3 F (36.8 C)  TempSrc: Oral Oral Oral Oral  SpO2: 95% 94% 95% 93%  Weight:  92.6 kg (204 lb 2.3 oz)    Height:  6' 1" (1.854 m)      General: Pt is alert, awake, not in acute distress Cardiovascular: RRR, S1/S2 +, no rubs, no gallops Respiratory: CTA bilaterally, no wheezing, no rhonchi Abdominal: Soft, NT, ND, bowel sounds + Extremities: no edema, no  cyanosis, BL LE wound with clean dressing.     The results of significant diagnostics from this hospitalization (including imaging, microbiology, ancillary and laboratory) are listed below for reference.     Microbiology: Recent Results (from the past 240 hour(s))  C difficile quick scan w PCR reflex     Status: None   Collection Time: 06/03/17  3:24 AM  Result Value Ref Range Status   C Diff antigen NEGATIVE NEGATIVE Final   C Diff toxin NEGATIVE NEGATIVE Final   C Diff interpretation No C. difficile detected.  Final  Blood culture (routine x 2)     Status: None   Collection Time: 06/03/17  4:02 AM  Result Value Ref Range Status   Specimen Description BLOOD RIGHT WRIST  Final   Special Requests IN PEDIATRIC BOTTLE Blood Culture adequate volume  Final   Culture NO GROWTH 5 DAYS  Final   Report Status 06/08/2017 FINAL  Final  Blood culture (routine x 2)     Status: Abnormal   Collection Time: 06/03/17  4:48 AM  Result Value Ref Range Status   Specimen Description BLOOD RIGHT HAND  Final   Special Requests IN PEDIATRIC BOTTLE Blood Culture adequate volume  Final   Culture  Setup Time   Final    GRAM POSITIVE COCCI IN CHAINS IN PEDIATRIC BOTTLE CRITICAL RESULT CALLED TO, READ BACK BY AND VERIFIED WITH: J. Carney Pharm.D. 20:50 06/03/17 ([email or web address])    Culture (A)  Final    VIRIDANS STREPTOCOCCUS THE SIGNIFICANCE OF ISOLATING THIS ORGANISM FROM A SINGLE SET OF BLOOD CULTURES WHEN MULTIPLE SETS ARE DRAWN IS UNCERTAIN. PLEASE NOTIFY THE MICROBIOLOGY DEPARTMENT WITHIN ONE WEEK IF SPECIATION AND SENSITIVITIES ARE REQUIRED.    Report Status 06/04/2017 FINAL  Final  Blood Culture ID Panel (Reflexed)     Status: Abnormal   Collection Time: 06/03/17  4:48 AM  Result Value Ref Range Status   Enterococcus species NOT DETECTED NOT DETECTED Final   Listeria monocytogenes NOT DETECTED NOT DETECTED Final   Staphylococcus species NOT DETECTED NOT DETECTED Final   Staphylococcus aureus NOT  DETECTED NOT DETECTED Final   Streptococcus species DETECTED (A) NOT DETECTED Final    Comment: Not Enterococcus species, Streptococcus agalactiae, Streptococcus pyogenes, or Streptococcus pneumoniae. CRITICAL RESULT CALLED TO, READ BACK BY AND VERIFIED WITH: J. Carney Pharm.D. 20:50 06/03/17 ([email or web address])    Streptococcus agalactiae NOT DETECTED NOT DETECTED Final   Streptococcus pneumoniae NOT DETECTED NOT DETECTED Final   Streptococcus pyogenes NOT DETECTED NOT DETECTED Final   Acinetobacter baumannii NOT DETECTED NOT DETECTED Final   Enterobacteriaceae species NOT DETECTED NOT DETECTED Final   Enterobacter cloacae complex NOT DETECTED NOT DETECTED Final   Escherichia coli NOT DETECTED NOT DETECTED Final   Klebsiella oxytoca NOT DETECTED NOT DETECTED Final   Klebsiella pneumoniae NOT DETECTED NOT DETECTED Final   Proteus species NOT DETECTED NOT DETECTED Final   Serratia marcescens NOT DETECTED NOT DETECTED Final   Carbapenem resistance NOT DETECTED NOT DETECTED Final   Haemophilus influenzae NOT DETECTED NOT DETECTED Final   Neisseria meningitidis NOT DETECTED NOT DETECTED Final   Pseudomonas aeruginosa NOT DETECTED NOT DETECTED Final   Candida albicans NOT DETECTED NOT DETECTED Final   Candida glabrata NOT DETECTED  NOT DETECTED Final   Candida krusei NOT DETECTED NOT DETECTED Final   Candida parapsilosis NOT DETECTED NOT DETECTED Final   Candida tropicalis NOT DETECTED NOT DETECTED Final  Gastrointestinal Panel by PCR , Stool     Status: None   Collection Time: 06/03/17  5:14 AM  Result Value Ref Range Status   Campylobacter species NOT DETECTED NOT DETECTED Final   Plesimonas shigelloides NOT DETECTED NOT DETECTED Final   Salmonella species NOT DETECTED NOT DETECTED Final   Yersinia enterocolitica NOT DETECTED NOT DETECTED Final   Vibrio species NOT DETECTED NOT DETECTED Final   Vibrio cholerae NOT DETECTED NOT DETECTED Final   Enteroaggregative E coli (EAEC) NOT DETECTED NOT  DETECTED Final   Enteropathogenic E coli (EPEC) NOT DETECTED NOT DETECTED Final   Enterotoxigenic E coli (ETEC) NOT DETECTED NOT DETECTED Final   Shiga like toxin producing E coli (STEC) NOT DETECTED NOT DETECTED Final   Shigella/Enteroinvasive E coli (EIEC) NOT DETECTED NOT DETECTED Final   Cryptosporidium NOT DETECTED NOT DETECTED Final   Cyclospora cayetanensis NOT DETECTED NOT DETECTED Final   Entamoeba histolytica NOT DETECTED NOT DETECTED Final   Giardia lamblia NOT DETECTED NOT DETECTED Final   Adenovirus F40/41 NOT DETECTED NOT DETECTED Final   Astrovirus NOT DETECTED NOT DETECTED Final   Norovirus GI/GII NOT DETECTED NOT DETECTED Final   Rotavirus A NOT DETECTED NOT DETECTED Final   Sapovirus (I, II, IV, and V) NOT DETECTED NOT DETECTED Final    Comment: Performed at [facility], [address] [place], [place] [address]  OVA + PARASITE EXAM     Status: None   Collection Time: 06/03/17  5:14 AM  Result Value Ref Range Status   OVA + PARASITE EXAM Final report  Final    Comment: (NOTE) These results were obtained using wet preparation(s) and trichrome stained smear. This test does not include testing for Cryptosporidium parvum, Cyclospora, or Microsporidia. Performed At: [facility] [place], [place] [id number] Rush Farmer MD [id number]    Source of Sample STOOL  Final  MRSA PCR Screening     Status: None   Collection Time: 06/03/17  6:26 AM  Result Value Ref Range Status   MRSA by PCR NEGATIVE NEGATIVE Final    Comment:        The GeneXpert MRSA Assay (FDA approved for NASAL specimens only), is one component of a comprehensive MRSA colonization surveillance program. It is not intended to diagnose MRSA infection nor to guide or monitor treatment for MRSA infections.   Respiratory Panel by PCR     Status: None   Collection Time: 06/03/17  3:14 PM  Result Value Ref Range Status   Adenovirus NOT DETECTED NOT DETECTED Final    Coronavirus 229E NOT DETECTED NOT DETECTED Final   Coronavirus HKU1 NOT DETECTED NOT DETECTED Final   Coronavirus NL63 NOT DETECTED NOT DETECTED Final   Coronavirus OC43 NOT DETECTED NOT DETECTED Final   Metapneumovirus NOT DETECTED NOT DETECTED Final   Rhinovirus / Enterovirus NOT DETECTED NOT DETECTED Final   Influenza A NOT DETECTED NOT DETECTED Final   Influenza B NOT DETECTED NOT DETECTED Final   Parainfluenza Virus 1 NOT DETECTED NOT DETECTED Final   Parainfluenza Virus 2 NOT DETECTED NOT DETECTED Final   Parainfluenza Virus 3 NOT DETECTED NOT DETECTED Final   Parainfluenza Virus 4 NOT DETECTED NOT DETECTED Final   Respiratory Syncytial Virus NOT DETECTED NOT DETECTED Final   Bordetella pertussis NOT DETECTED NOT  DETECTED Final   Chlamydophila pneumoniae NOT DETECTED NOT DETECTED Final   Mycoplasma pneumoniae NOT DETECTED NOT DETECTED Final  Culture, respiratory (NON-Expectorated)     Status: None   Collection Time: 06/03/17  9:37 PM  Result Value Ref Range Status   Specimen Description TRACHEAL ASPIRATE  Final   Special Requests NONE  Final   Gram Stain   Final    RARE WBC PRESENT, PREDOMINANTLY PMN FEW GRAM POSITIVE COCCI IN PAIRS RARE GRAM NEGATIVE RODS RARE GRAM POSITIVE RODS    Culture Consistent with normal respiratory flora.  Final   Report Status 06/06/2017 FINAL  Final  Culture, blood (routine x 2)     Status: None   Collection Time: 06/05/17  9:31 AM  Result Value Ref Range Status   Specimen Description BLOOD LEFT ARM  Final   Special Requests   Final    BOTTLES DRAWN AEROBIC ONLY Blood Culture adequate volume   Culture NO GROWTH 5 DAYS  Final   Report Status 06/10/2017 FINAL  Final  Culture, blood (routine x 2)     Status: None   Collection Time: 06/05/17  9:38 AM  Result Value Ref Range Status   Specimen Description BLOOD LEFT FOREARM  Final   Special Requests   Final    BOTTLES DRAWN AEROBIC ONLY Blood Culture adequate volume   Culture NO GROWTH 5 DAYS   Final   Report Status 06/10/2017 FINAL  Final     Labs: BNP (last 3 results) Recent Labs    03/08/17 1450 04/16/17 0239 06/05/17 0536  BNP 55.1 28.3 361.4*   Basic Metabolic Panel: Recent Labs  Lab 06/07/17 0518 06/08/17 0444 06/09/17 0414 06/10/17 0023 06/10/17 1342 06/11/17 0523 06/12/17 0559  NA 136 138 134*  --  131* 130* 127*  K 4.0 3.5 3.1*  --  3.3* 3.6 3.7  CL 104 101 99*  --  93* 94* 96*  CO2 28 32 31  --  _0 GLUCOSE 244* 160* 188*  --  186* 328* 262*  BUN 25* 17 12  --  _1 CREATININE 1.14 0.97 0.99  --  1.08 1.04 0.86  CALCIUM 8.6* 9.3 8.2*  --  8.3* 8.0* 8.2*  MG 1.7 1.5*  --  1.5*  --  1.8 1.6*  PHOS 3.4  --   --  2.9  --  2.6 2.9   Liver Function Tests: Recent Labs  Lab 06/07/17 0518 06/09/17 0414 06/12/17 0559  AST 79* 58* 68*  ALT 77* 52 48  ALKPHOS 110 92 93  BILITOT 1.1 1.0 0.6  PROT 7.5 7.3 7.7  ALBUMIN 2.0* 1.8* 2.0*   No results for input(s): LIPASE, AMYLASE in the last 168 hours. No results for input(s): AMMONIA in the last 168 hours. CBC: Recent Labs  Lab 06/08/17 0444 06/09/17 0414 06/10/17 0023 06/11/17 0523 06/12/17 0559  WBC 12.8* 16.5* 16.2* 18.9* 16.3*  NEUTROABS 8.2* 8.1* 9.0* 11.5* 10.3*  HGB 10.4* 9.6* 10.0* 9.9* 10.8*  HCT 31.0* 29.6* 29.7* 29.5* 30.6*  MCV 89.1 89.4 88.4 87.0 86.0  PLT 285 271 297 343 338   Cardiac Enzymes: Recent Labs  Lab 06/10/17 0023  TROPONINI <0.03   BNP: Invalid input(s): POCBNP CBG: Recent Labs  Lab 06/11/17 0736 06/11/17 1145 06/11/17 1643 06/11/17 2103 06/12/17 0737  GLUCAP 324* 193* 86 293* 251*   D-Dimer No results for input(s): DDIMER in the last 72 hours. Hgb A1c No results for input(s): HGBA1C in the  last 72 hours. Lipid Profile No results for input(s): CHOL, HDL, LDLCALC, TRIG, CHOLHDL, LDLDIRECT in the last 72 hours. Thyroid function studies No results for input(s): TSH, T4TOTAL, T3FREE, THYROIDAB in the last 72 hours.  Invalid input(s):  FREET3 Anemia work up No results for input(s): VITAMINB12, FOLATE, FERRITIN, TIBC, IRON, RETICCTPCT in the last 72 hours. Urinalysis    Component Value Date/Time   COLORURINE YELLOW 06/11/2017 [place] 06/11/2017 1417   LABSPEC 1.017 06/11/2017 1417   PHURINE 7.0 06/11/2017 1417   GLUCOSEU >=500 (A) 06/11/2017 1417   HGBUR SMALL (A) 06/11/2017 1417   BILIRUBINUR NEGATIVE 06/11/2017 1417   BILIRUBINUR neg 05/16/2017 1013   KETONESUR NEGATIVE 06/11/2017 1417   PROTEINUR 100 (A) 06/11/2017 1417   UROBILINOGEN 1.0 05/16/2017 1013   NITRITE NEGATIVE 06/11/2017 1417   LEUKOCYTESUR NEGATIVE 06/11/2017 1417   Sepsis Labs Invalid input(s): PROCALCITONIN,  WBC,  LACTICIDVEN Microbiology Recent Results (from the past 240 hour(s))  C difficile quick scan w PCR reflex     Status: None   Collection Time: 06/03/17  3:24 AM  Result Value Ref Range Status   C Diff antigen NEGATIVE NEGATIVE Final   C Diff toxin NEGATIVE NEGATIVE Final   C Diff interpretation No C. difficile detected.  Final  Blood culture (routine x 2)     Status: None   Collection Time: 06/03/17  4:02 AM  Result Value Ref Range Status   Specimen Description BLOOD RIGHT WRIST  Final   Special Requests IN PEDIATRIC BOTTLE Blood Culture adequate volume  Final   Culture NO GROWTH 5 DAYS  Final   Report Status 06/08/2017 FINAL  Final  Blood culture (routine x 2)     Status: Abnormal   Collection Time: 06/03/17  4:48 AM  Result Value Ref Range Status   Specimen Description BLOOD RIGHT HAND  Final   Special Requests IN PEDIATRIC BOTTLE Blood Culture adequate volume  Final   Culture  Setup Time   Final    GRAM POSITIVE COCCI IN CHAINS IN PEDIATRIC BOTTLE CRITICAL RESULT CALLED TO, READ BACK BY AND VERIFIED WITH: J. Carney Pharm.D. 20:50 06/03/17 ([email or web address])    Culture (A)  Final    VIRIDANS STREPTOCOCCUS THE SIGNIFICANCE OF ISOLATING THIS ORGANISM FROM A SINGLE SET OF BLOOD CULTURES WHEN MULTIPLE SETS ARE DRAWN  IS UNCERTAIN. PLEASE NOTIFY THE MICROBIOLOGY DEPARTMENT WITHIN ONE WEEK IF SPECIATION AND SENSITIVITIES ARE REQUIRED.    Report Status 06/04/2017 FINAL  Final  Blood Culture ID Panel (Reflexed)     Status: Abnormal   Collection Time: 06/03/17  4:48 AM  Result Value Ref Range Status   Enterococcus species NOT DETECTED NOT DETECTED Final   Listeria monocytogenes NOT DETECTED NOT DETECTED Final   Staphylococcus species NOT DETECTED NOT DETECTED Final   Staphylococcus aureus NOT DETECTED NOT DETECTED Final   Streptococcus species DETECTED (A) NOT DETECTED Final    Comment: Not Enterococcus species, Streptococcus agalactiae, Streptococcus pyogenes, or Streptococcus pneumoniae. CRITICAL RESULT CALLED TO, READ BACK BY AND VERIFIED WITH: J. Carney Pharm.D. 20:50 06/03/17 ([email or web address])    Streptococcus agalactiae NOT DETECTED NOT DETECTED Final   Streptococcus pneumoniae NOT DETECTED NOT DETECTED Final   Streptococcus pyogenes NOT DETECTED NOT DETECTED Final   Acinetobacter baumannii NOT DETECTED NOT DETECTED Final   Enterobacteriaceae species NOT DETECTED NOT DETECTED Final   Enterobacter cloacae complex NOT DETECTED NOT DETECTED Final   Escherichia coli NOT DETECTED NOT DETECTED Final   Klebsiella oxytoca NOT DETECTED NOT DETECTED  Final   Klebsiella pneumoniae NOT DETECTED NOT DETECTED Final   Proteus species NOT DETECTED NOT DETECTED Final   Serratia marcescens NOT DETECTED NOT DETECTED Final   Carbapenem resistance NOT DETECTED NOT DETECTED Final   Haemophilus influenzae NOT DETECTED NOT DETECTED Final   Neisseria meningitidis NOT DETECTED NOT DETECTED Final   Pseudomonas aeruginosa NOT DETECTED NOT DETECTED Final   Candida albicans NOT DETECTED NOT DETECTED Final   Candida glabrata NOT DETECTED NOT DETECTED Final   Candida krusei NOT DETECTED NOT DETECTED Final   Candida parapsilosis NOT DETECTED NOT DETECTED Final   Candida tropicalis NOT DETECTED NOT DETECTED Final  Gastrointestinal  Panel by PCR , Stool     Status: None   Collection Time: 06/03/17  5:14 AM  Result Value Ref Range Status   Campylobacter species NOT DETECTED NOT DETECTED Final   Plesimonas shigelloides NOT DETECTED NOT DETECTED Final   Salmonella species NOT DETECTED NOT DETECTED Final   Yersinia enterocolitica NOT DETECTED NOT DETECTED Final   Vibrio species NOT DETECTED NOT DETECTED Final   Vibrio cholerae NOT DETECTED NOT DETECTED Final   Enteroaggregative E coli (EAEC) NOT DETECTED NOT DETECTED Final   Enteropathogenic E coli (EPEC) NOT DETECTED NOT DETECTED Final   Enterotoxigenic E coli (ETEC) NOT DETECTED NOT DETECTED Final   Shiga like toxin producing E coli (STEC) NOT DETECTED NOT DETECTED Final   Shigella/Enteroinvasive E coli (EIEC) NOT DETECTED NOT DETECTED Final   Cryptosporidium NOT DETECTED NOT DETECTED Final   Cyclospora cayetanensis NOT DETECTED NOT DETECTED Final   Entamoeba histolytica NOT DETECTED NOT DETECTED Final   Giardia lamblia NOT DETECTED NOT DETECTED Final   Adenovirus F40/41 NOT DETECTED NOT DETECTED Final   Astrovirus NOT DETECTED NOT DETECTED Final   Norovirus GI/GII NOT DETECTED NOT DETECTED Final   Rotavirus A NOT DETECTED NOT DETECTED Final   Sapovirus (I, II, IV, and V) NOT DETECTED NOT DETECTED Final    Comment: Performed at [facility], [address] [place], [place] [address]  OVA + PARASITE EXAM     Status: None   Collection Time: 06/03/17  5:14 AM  Result Value Ref Range Status   OVA + PARASITE EXAM Final report  Final    Comment: (NOTE) These results were obtained using wet preparation(s) and trichrome stained smear. This test does not include testing for Cryptosporidium parvum, Cyclospora, or Microsporidia. Performed At: [facility] [place], [place] [id number] Rush Farmer MD [id number]    Source of Sample STOOL  Final  MRSA PCR Screening     Status: None   Collection Time: 06/03/17  6:26 AM  Result Value  Ref Range Status   MRSA by PCR NEGATIVE NEGATIVE Final    Comment:        The GeneXpert MRSA Assay (FDA approved for NASAL specimens only), is one component of a comprehensive MRSA colonization surveillance program. It is not intended to diagnose MRSA infection nor to guide or monitor treatment for MRSA infections.   Respiratory Panel by PCR     Status: None   Collection Time: 06/03/17  3:14 PM  Result Value Ref Range Status   Adenovirus NOT DETECTED NOT DETECTED Final   Coronavirus 229E NOT DETECTED NOT DETECTED Final   Coronavirus HKU1 NOT DETECTED NOT DETECTED Final   Coronavirus NL63 NOT DETECTED NOT DETECTED Final   Coronavirus OC43 NOT DETECTED NOT DETECTED Final   Metapneumovirus NOT DETECTED NOT DETECTED Final   Rhinovirus / Enterovirus NOT  DETECTED NOT DETECTED Final   Influenza A NOT DETECTED NOT DETECTED Final   Influenza B NOT DETECTED NOT DETECTED Final   Parainfluenza Virus 1 NOT DETECTED NOT DETECTED Final   Parainfluenza Virus 2 NOT DETECTED NOT DETECTED Final   Parainfluenza Virus 3 NOT DETECTED NOT DETECTED Final   Parainfluenza Virus 4 NOT DETECTED NOT DETECTED Final   Respiratory Syncytial Virus NOT DETECTED NOT DETECTED Final   Bordetella pertussis NOT DETECTED NOT DETECTED Final   Chlamydophila pneumoniae NOT DETECTED NOT DETECTED Final   Mycoplasma pneumoniae NOT DETECTED NOT DETECTED Final  Culture, respiratory (NON-Expectorated)     Status: None   Collection Time: 06/03/17  9:37 PM  Result Value Ref Range Status   Specimen Description TRACHEAL ASPIRATE  Final   Special Requests NONE  Final   Gram Stain   Final    RARE WBC PRESENT, PREDOMINANTLY PMN FEW GRAM POSITIVE COCCI IN PAIRS RARE GRAM NEGATIVE RODS RARE GRAM POSITIVE RODS    Culture Consistent with normal respiratory flora.  Final   Report Status 06/06/2017 FINAL  Final  Culture, blood (routine x 2)     Status: None   Collection Time: 06/05/17  9:31 AM  Result Value Ref Range Status    Specimen Description BLOOD LEFT ARM  Final   Special Requests   Final    BOTTLES DRAWN AEROBIC ONLY Blood Culture adequate volume   Culture NO GROWTH 5 DAYS  Final   Report Status 06/10/2017 FINAL  Final  Culture, blood (routine x 2)     Status: None   Collection Time: 06/05/17  9:38 AM  Result Value Ref Range Status   Specimen Description BLOOD LEFT FOREARM  Final   Special Requests   Final    BOTTLES DRAWN AEROBIC ONLY Blood Culture adequate volume   Culture NO GROWTH 5 DAYS  Final   Report Status 06/10/2017 FINAL  Final     Time coordinating discharge: Over 30 minutes  SIGNED:   Elmarie Shiley, MD  [facility] 06/12/2017, 11:10 AM Pager   If 7PM-7AM, please contact night-coverage [email or web address] Password [email or web address]

## 2017-06-12 NOTE — Progress Notes (Signed)
PMR Admission Coordinator Pre-Admission Assessment  Patient: Matthew Riley is an 42 y.o., male MRN: [id number] DOB: Oct 20, 1975 Height: 6\' 1"  (185.4 cm) Weight: 92.6 kg (204 lb 2.3 oz)                                                                                                                                                  Insurance Information HMO:     PPO:      PCP:      IPA:      80/20:      OTHER:  PRIMARY: Uninsured/Self-pay      Policy#:       Subscriber:  CM Name:       Phone#:      Fax#:  Pre-Cert#:       Employer:  Benefits:  Phone #:      Name:  Eff. 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 UDS positive for THC and benzos.Patient with reports of N/V with diarrhea and was intubated due to decreased LOC and treated for sepsis with pressors and broad spectrum antibiotics. CT chest/abdomen showed bilateral PNA. One blood culture positive for strep viridans. CT abdomen repeated showing anasarca but no abscess or abnormality. CT head done due to encephalopathy and negative for acute changes.  He self extubated on tolerated extubation on 06/08/17 andrespiratory status stable. Hypotension treated with fluid boluses. Electrolyte abnormality resolving and to continue antibiotic for additional day to complete 9 day antibiotic regimen. BLE wounds treated with 5 day course of doxycyline. Therapy ongoing and CIR recommended due to functional deficits. Patient admitted to Inpatient Rehabilitation 06/12/17.     Past Medical History      Past Medical History:  Diagnosis Date  .  Diabetes mellitus without complication (HCC)   . Hypertension   . Renal disorder   . Stroke [facility])     Family History  Family history is unknown by patient.  Prior Rehab/Hospitalizations:  Has the patient had major surgery during 100 days prior to admission? No  Current Medications   Current Facility-Administered Medications:  .  0.9 %  sodium chloride infusion, 250 mL, Intravenous, PRN, Tobey Grim, NP .  aspirin chewable tablet 81 mg, 81 mg, Per Tube, Daily, Tobey Grim, NP, 81 mg at 06/12/17 1051 .  cefTRIAXone (ROCEPHIN) 2 g in dextrose 5 % 50 mL IVPB - Premix, 2 g, Intravenous, Q24H, Regalado, Belkys A, MD, Stopped at 06/11/17 1838 .  collagenase (SANTYL) ointment, , Topical, Daily, Regalado, Belkys A, MD, 1 application at 06/11/17 1341 .  doxycycline (VIBRA-TABS) tablet 100 mg, 100 mg, Oral, Q12H, Regalado, Belkys A, MD, 100 mg at 06/12/17 1050 .  feeding supplement (PRO-STAT SUGAR FREE 64) liquid 30 mL, 30 mL, Per Tube, BID, Lynnell Jude, MD, 30 mL at 06/12/17 1051 .  guaiFENesin (MUCINEX) 12 hr tablet 600 mg, 600 mg, Oral, BID, Regalado, Belkys A, MD, 600 mg at 06/12/17 1050 .  heparin injection 5,000 Units, 5,000 Units, Subcutaneous, Q8H, Eubanks, Katalina M, NP, 5,000 Units at 06/12/17 0616 .  hydrALAZINE (APRESOLINE) injection 10-40 mg, 10-40 mg, Intravenous, Q4H PRN, Max Fickle B, MD, 10 mg at 06/09/17 0610 .  insulin aspart (novoLOG) injection 0-15 Units, 0-15  Units, Subcutaneous, TID WC, Lynnell Jude, MD, 8 Units at 06/12/17 [phone] .  insulin aspart (novoLOG) injection 5 Units, 5 Units, Subcutaneous, TID WC, Regalado, Belkys A, MD, 5 Units at 06/12/17 0848 .  insulin glargine (LANTUS) injection 30 Units, 30 Units, Subcutaneous, QHS, Regalado, Belkys A, MD, 30 Units at 06/11/17 2206 .  metoprolol tartrate (LOPRESSOR) injection 2.5 mg, 2.5 mg, Intravenous, Q3H PRN, Karl Ito, MD, 2.5 mg at 06/05/17 2039 .  thiamine (VITAMIN B-1) tablet 100 mg, 100 mg, Oral, Daily, Regalado, Belkys A, MD, 100 mg at 06/12/17 1051 .  traMADol (ULTRAM) tablet 50 mg, 50 mg, Oral, Q8H PRN, Regalado, Belkys A, MD, 50 mg at 06/12/17 1050  Patients Current Diet: Diet heart healthy/carb modified Room service appropriate? Yes; Fluid consistency: Thin Diet - low sodium heart healthy  Precautions / Restrictions Precautions Precautions: Fall Restrictions Weight Bearing Restrictions: No   Has the patient had 2 or more falls or a fall with injury in the past year?Yes  Prior Activity Level Limited Community (1-2x/wk): Prior to admission patient lived with his sister, who works.  However, she would take him to the store and appointments as needed.  Patient recently moved here from [facility] and has pending [place] Medicaid application.   Home Assistive Devices / Equipment Home Assistive Devices/Equipment: Dan Humphreys (specify type) Home Equipment: Walker - 2 wheels  Prior Device Use: Indicate devices/aids used by the patient prior to current illness, exacerbation or injury? Walker  Prior Functional Level Prior Function Level of Independence: Independent Comments: independent prior to 04/2017 admission. Pt has been utilizing RW following that admission and receiving HHPT.   Self Care: Did the patient need help bathing, dressing, using the toilet or eating? Independent  Indoor Mobility: Did the patient need assistance with walking from room to room (with or without device)?  Independent  Stairs: Did the patient need assistance with internal or external stairs (with or without device)? Independent  Functional Cognition: Did the patient need help planning regular tasks  such as shopping or remembering to take medications? Independent  Current Functional Level Cognition  Overall Cognitive Status: Within Functional Limits for tasks assessed Orientation Level: Oriented to person, Oriented to place, Disoriented to time, Disoriented to situation    Extremity Assessment (includes Sensation/Coordination)  Upper Extremity Assessment: Generalized weakness  Lower Extremity Assessment: Generalized weakness    ADLs       Mobility  Overal bed mobility: Needs Assistance Bed Mobility: Supine to Sit Supine to sit: Min assist Sit to supine: Mod assist General bed mobility comments: pt OOB in recliner chair upon arrival    Transfers  Overall transfer level: Needs assistance Equipment used: Rolling walker (2 wheeled) Transfers: Sit to/from Stand Sit to Stand: Min assist General transfer comment: from recliner chair, assist for stability with transition, cueing for safe hand placement    Ambulation / Gait / Stairs / Wheelchair Mobility  Ambulation/Gait Ambulation/Gait assistance: Min guard Ambulation Distance (Feet): 75 Feet Assistive device: Rolling walker (2 wheeled) Gait Pattern/deviations: Step-through pattern, Decreased stride length, Trunk flexed General Gait Details: cueing for improved upright posture, mild instability with RW but no overt LOB or need for physical assistance, min guard for safety; pt limited secondary to fatigue Gait velocity: decreased Gait velocity interpretation: Below normal speed for age/gender    Posture / Balance Balance Overall balance assessment: Needs assistance Sitting-balance support: Feet supported Sitting balance-Leahy Scale: Good Standing balance support: Bilateral upper extremity supported, During functional  activity Standing balance-Leahy Scale: Poor    Special needs/care consideration BiPAP/CPAP: No CPM: No Continuous Drip IV: No Dialysis: No         Life Vest: No Oxygen: No Special Bed: No Trach Size: No Wound Vac (area): No       Skin: Per WOC note from 1/819 Wound type: Unclear, full thickness wounds bilateral LE. Right medial ankle and left posterior achilles area Pressure Injury POA:NA Measurement: Right 9cm x 2.5cm x 0cm  Left 4cm x 1.5cm x 0.1cm Wound bed: Right; 100% eschar Left: 100% yellow Drainage (amount, consistency, odor)none from the RLE, minimal from the LLE. Not purulent, no odor Periwound:intact, weak distal pulses, dry skin Dressing procedure/placement/frequency: Will add enzymatic debridement for the LLE because it is open and necrotic. Will only add betadine to the RLE at this time, it is eschar and stable. Without knowing etiology would not want to open wound at this time. Bowel mgmt: Incontinent, last documented BM 06/10/17, but patient reports a continent BM 06/11/17 Bladder mgmt: Continent with increased urgency  Diabetic mgmt: Yes, checked CBGs and took sliding scale insulin PTA     Previous Home Environment Living Arrangements: Other relatives(sister) Available Help at Discharge: Family, Available PRN/intermittently Type of Home: House Home Layout: Two level Alternate Level Stairs-Rails: Left Alternate Level Stairs-Number of Steps: 16 Home Access: Level entry Bathroom Shower/Tub: Engineer, manufacturing systems: Standard Home Care Services: Yes Type of Home Care Services: Home PT  Discharge Living Setting Plans for Discharge Living Setting: Lives with (comment)(Sister ) Type of Home at Discharge: House Discharge Home Layout: Two level, Bed/bath upstairs Alternate Level Stairs-Rails: Left Alternate Level Stairs-Number of Steps: 12-14 steps Discharge Home Access: Level entry Discharge Bathroom Shower/Tub: Tub/shower unit,  Curtain Discharge Bathroom Toilet: Standard Discharge Bathroom Accessibility: Yes How Accessible: Accessible via walker Does the patient have any problems obtaining your medications?: Yes (Describe)(currently uninsured with Medicaid application pending )  Social/Family/Support Systems Patient Roles: Other (Comment)(Brother ) Contact Information: Sister: Cardell Peach [phone] Anticipated Caregiver: Mod I goals  Ability/Limitations of  Caregiver: Sister works  Medical laboratory scientific officer: Evenings only Discharge Plan Discussed with Primary Caregiver: Yes Is Caregiver In Agreement with Plan?: Yes Does Caregiver/Family have Issues with Lodging/Transportation while Pt is in Rehab?: No  Goals/Additional Needs Patient/Family Goal for Rehab: PT/OT/SLP: Mod I  Expected length of stay: 11-14 days  Cultural Considerations: Christian  Dietary Needs: Heart healthy; Carb. Mod. Equipment Needs: TBD Pt/Family Agrees to Admission and willing to participate: Yes Additional Information Needs:  Medicaid pending application; Newton Pigg ([phone]) assigned to case Information Needs to be Provided By: Team FYI  Decrease burden of Care through IP rehab admission: No  Possible need for SNF placement upon discharge: No  Patient Condition: This patient's medical and functional status has changed since the consult dated: 06/10/17 at 11:46 am in which the Rehabilitation Physician determined and documented that the patient's condition is appropriate for intensive rehabilitative care in an inpatient rehabilitation facility. See "History of Present Illness" (above) for medical update. Functional changes are: Min A transfers and gait. Patient's medical and functional status update has been discussed with the Rehabilitation physician and patient remains appropriate for inpatient rehabilitation. Will admit to inpatient rehab today.  Preadmission Screen Completed By:  Fae Pippin, 06/12/2017 12:19  PM ______________________________________________________________________   Discussed status with Dr. Allena Katz on 06/12/17 at 1215 and received telephone approval for admission today.  Admission Coordinator:  Fae Pippin, time 1215/Date 06/12/17             Cosigned by: Marcello Fennel, MD at 06/12/2017 12:28 PM  Revision History

## 2017-06-12 NOTE — Progress Notes (Addendum)
Hypoglycemic Event  CBG: 37  Treatment: 15 GM carbohydrate snack  Symptoms: Sweaty  Follow-up CBG: Time:1647 CBG Result:72  Possible Reasons for Event: Medication regimen: change in insulin regimen  Comments/MD notified: Marissa Nestle, PA notified; recommended to recheck blood sugar and only give patient 15 units of his scheduled 70/30 insulin.    Dani Gobble

## 2017-06-12 NOTE — PMR Pre-admission (Signed)
PMR Admission Coordinator Pre-Admission Assessment  Patient: Matthew Riley is an 42 y.o., male MRN: [id number] DOB: February 13, 1976 Height: 6\' 1"  (185.4 cm) Weight: 92.6 kg (204 lb 2.3 oz)              Insurance Information HMO:     PPO:      PCP:      IPA:      80/20:      OTHER:  PRIMARY: Uninsured/Self-pay      Policy#:       Subscriber:  CM Name:       Phone#:      Fax#:  Pre-Cert#:       Employer:  Benefits:  Phone #:      Name:  Eff. 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 UDS positive for THC and benzos.Patient with reports of N/V with diarrhea and was intubated due to decreased LOC and treated for sepsis with pressors and broad spectrum antibiotics. CT chest/abdomen showed bilateral PNA. One blood culture positive for strep viridans. CT abdomen repeated showing anasarca but no abscess or abnormality. CT head done due to encephalopathy and negative for acute changes. He self extubated on tolerated extubation on 06/08/17 and respiratory status stable. Hypotension treated with fluid boluses. Electrolyte abnormality resolving  and to continue antibiotic for additional day to complete 9 day antibiotic regimen.  BLE wounds treated with 5 day course of doxycyline. Therapy ongoing and CIR recommended due to functional deficits. Patient admitted to Inpatient Rehabilitation 06/12/17.         Past Medical History  Past Medical History:  Diagnosis Date  . Diabetes mellitus without complication (HCC)   . Hypertension   . Renal disorder   . Stroke [facility])     Family History  Family history is unknown by patient.  Prior Rehab/Hospitalizations:  Has the patient had major surgery during 100 days prior to admission? No  Current Medications   Current Facility-Administered Medications:  .  0.9 %  sodium chloride infusion, 250 mL, Intravenous, PRN, [facility], NP .  aspirin chewable tablet 81 mg, 81 mg, Per Tube, Daily, Tobey Grim, NP, 81 mg at 06/12/17 1051 .  cefTRIAXone (ROCEPHIN) 2 g in dextrose 5 % 50 mL IVPB - Premix, 2 g, Intravenous, Q24H, Regalado, Belkys A, MD, Stopped at 06/11/17 1838 .  collagenase (SANTYL) ointment, , Topical, Daily, Regalado, Belkys A, MD, 1 application at  06/11/17 1341 .  doxycycline (VIBRA-TABS) tablet 100 mg, 100 mg, Oral, Q12H, Regalado, Belkys A, MD, 100 mg at 06/12/17 1050 .  feeding supplement (PRO-STAT SUGAR FREE 64) liquid 30 mL, 30 mL, Per Tube, BID, Lynnell Jude, MD, 30 mL at 06/12/17 1051 .  guaiFENesin (MUCINEX) 12 hr tablet 600 mg, 600 mg, Oral, BID, Regalado, Belkys A, MD, 600 mg at 06/12/17 1050 .  heparin injection 5,000 Units, 5,000 Units, Subcutaneous, Q8H, Eubanks, Katalina M, NP, 5,000 Units at 06/12/17 0616 .  hydrALAZINE (APRESOLINE) injection 10-40 mg, 10-40 mg, Intravenous, Q4H PRN, Max Fickle B, MD, 10 mg at 06/09/17 0610 .  insulin aspart (novoLOG) injection 0-15 Units, 0-15 Units, Subcutaneous, TID WC, Lynnell Jude, MD, 8 Units at 06/12/17 [phone] .  insulin aspart (novoLOG) injection 5 Units, 5 Units, Subcutaneous, TID WC, Regalado, Belkys A,  MD, 5 Units at 06/12/17 0848 .  insulin glargine (LANTUS) injection 30 Units, 30 Units, Subcutaneous, QHS, Regalado, Belkys A, MD, 30 Units at 06/11/17 2206 .  metoprolol tartrate (LOPRESSOR) injection 2.5 mg, 2.5 mg, Intravenous, Q3H PRN, Karl Ito, MD, 2.5 mg at 06/05/17 2039 .  thiamine (VITAMIN B-1) tablet 100 mg, 100 mg, Oral, Daily, Regalado, Belkys A, MD, 100 mg at 06/12/17 1051 .  traMADol (ULTRAM) tablet 50 mg, 50 mg, Oral, Q8H PRN, Regalado, Belkys A, MD, 50 mg at 06/12/17 1050  Patients Current Diet: Diet heart healthy/carb modified Room service appropriate? Yes; Fluid consistency: Thin Diet - low sodium heart healthy  Precautions / Restrictions Precautions Precautions: Fall Restrictions Weight Bearing Restrictions: No   Has the patient had 2 or more falls or a fall with injury in the past year?Yes  Prior Activity Level Limited Community (1-2x/wk): Prior to admission patient lived with his sister, who works.  However, she would take him to the store and appointments as needed.  Patient recently moved here from [facility] and has pending [place] Medicaid application.   Home Assistive Devices / Equipment Home Assistive Devices/Equipment: Dan Humphreys (specify type) Home Equipment: Walker - 2 wheels  Prior Device Use: Indicate devices/aids used by the patient prior to current illness, exacerbation or injury? Walker  Prior Functional Level Prior Function Level of Independence: Independent Comments: independent prior to 04/2017 admission. Pt has been utilizing RW following that admission and receiving HHPT.   Self Care: Did the patient need help bathing, dressing, using the toilet or eating? Independent  Indoor Mobility: Did the patient need assistance with walking from room to room (with or without device)? Independent  Stairs: Did the patient need assistance with internal or external stairs (with or without device)? Independent  Functional Cognition: Did the patient need help  planning regular tasks such as shopping or remembering to take medications? Independent  Current Functional Level Cognition  Overall Cognitive Status: Within Functional Limits for tasks assessed Orientation Level: Oriented to person, Oriented to place, Disoriented to time, Disoriented to situation    Extremity Assessment (includes Sensation/Coordination)  Upper Extremity Assessment: Generalized weakness  Lower Extremity Assessment: Generalized weakness    ADLs       Mobility  Overal bed mobility: Needs Assistance Bed Mobility: Supine to Sit Supine to sit: Min assist Sit to supine: Mod assist General bed mobility comments: pt OOB in recliner chair upon arrival    Transfers  Overall transfer level: Needs assistance Equipment used: Rolling walker (2 wheeled) Transfers: Sit to/from Stand Sit to Stand: Min assist General transfer comment: from recliner chair, assist for stability with transition, cueing  for safe hand placement    Ambulation / Gait / Stairs / Wheelchair Mobility  Ambulation/Gait Ambulation/Gait assistance: Min guard Ambulation Distance (Feet): 75 Feet Assistive device: Rolling walker (2 wheeled) Gait Pattern/deviations: Step-through pattern, Decreased stride length, Trunk flexed General Gait Details: cueing for improved upright posture, mild instability with RW but no overt LOB or need for physical assistance, min guard for safety; pt limited secondary to fatigue Gait velocity: decreased Gait velocity interpretation: Below normal speed for age/gender    Posture / Balance Balance Overall balance assessment: Needs assistance Sitting-balance support: Feet supported Sitting balance-Leahy Scale: Good Standing balance support: Bilateral upper extremity supported, During functional activity Standing balance-Leahy Scale: Poor    Special needs/care consideration BiPAP/CPAP: No CPM: No Continuous Drip IV: No Dialysis: No         Life Vest: No Oxygen: No Special  Bed: No Trach Size: No Wound Vac (area): No       Skin: Per WOC note from 1/819 Wound type: Unclear, full thickness wounds bilateral LE. Right medial ankle and left posterior achilles area  Pressure Injury POA:NA Measurement: Right 9cm x 2.5cm x 0cm  Left 4cm x 1.5cm x 0.1cm  Wound bed: Right; 100% eschar Left: 100% yellow Drainage (amount, consistency, odor)none from the RLE, minimal from the LLE.  Not purulent, no odor Periwound: intact, weak distal pulses, dry skin  Dressing procedure/placement/frequency: Will add enzymatic debridement for the LLE because it is open and necrotic. Will only add betadine to the RLE at this time, it is eschar and stable. Without knowing etiology would not want to open wound at this time.  Bowel mgmt: Incontinent, last documented BM 06/10/17, but patient reports a continent BM 06/11/17 Bladder mgmt: Continent with increased urgency  Diabetic mgmt: Yes, checked CBGs and took sliding scale insulin PTA     Previous Home Environment Living Arrangements: Other relatives(sister) Available Help at Discharge: Family, Available PRN/intermittently Type of Home: House Home Layout: Two level Alternate Level Stairs-Rails: Left Alternate Level Stairs-Number of Steps: 16 Home Access: Level entry Bathroom Shower/Tub: Engineer, manufacturing systems: Standard Home Care Services: Yes Type of Home Care Services: Home PT  Discharge Living Setting Plans for Discharge Living Setting: Lives with (comment)(Sister ) Type of Home at Discharge: House Discharge Home Layout: Two level, Bed/bath upstairs Alternate Level Stairs-Rails: Left Alternate Level Stairs-Number of Steps: 12-14 steps Discharge Home Access: Level entry Discharge Bathroom Shower/Tub: Tub/shower unit, Curtain Discharge Bathroom Toilet: Standard Discharge Bathroom Accessibility: Yes How Accessible: Accessible via walker Does the patient have any problems obtaining your medications?: Yes  (Describe)(currently uninsured with Medicaid application pending )  Social/Family/Support Systems Patient Roles: Other (Comment)(Brother ) Contact Information: Sister: Cardell Peach [phone] Anticipated Caregiver: Mod I goals  Ability/Limitations of Caregiver: Sister works  Medical laboratory scientific officer: Evenings only Discharge Plan Discussed with Primary Caregiver: Yes Is Caregiver In Agreement with Plan?: Yes Does Caregiver/Family have Issues with Lodging/Transportation while Pt is in Rehab?: No  Goals/Additional Needs Patient/Family Goal for Rehab: PT/OT/SLP: Mod I  Expected length of stay: 11-14 days  Cultural Considerations: Christian  Dietary Needs: Heart healthy; Carb. Mod. Equipment Needs: TBD Pt/Family Agrees to Admission and willing to participate: Yes Additional Information Needs: [place] Medicaid pending application; Newton Pigg (phone:[phone]) assigned to case Information Needs to be Provided By: Team FYI  Decrease burden of Care through IP rehab admission: No  Possible need for SNF placement upon discharge: No  Patient Condition: This patient's medical and functional status has changed since the consult dated: 06/10/17 at  11:46 am in which the Rehabilitation Physician determined and documented that the patient's condition is appropriate for intensive rehabilitative care in an inpatient rehabilitation facility. See "History of Present Illness" (above) for medical update. Functional changes are: Min A transfers and gait. Patient's medical and functional status update has been discussed with the Rehabilitation physician and patient remains appropriate for inpatient rehabilitation. Will admit to inpatient rehab today.  Preadmission Screen Completed By:  Fae Pippin, 06/12/2017 12:19 PM ______________________________________________________________________   Discussed status with Dr. Allena Katz on 06/12/17 at 1215 and received telephone approval for admission today.  Admission  Coordinator:  Fae Pippin, time 1215/Date 06/12/17

## 2017-06-12 NOTE — Progress Notes (Signed)
Hypoglycemic Event  CBG: 37  Treatment: 15 GM carbohydrate snack  Symptoms: Sweaty  Follow-up CBG: Time:1647 CBG Result:72  Possible Reasons for Event: SS + meal coverage   Comments/MD notified :[place]alado    Matthew Riley C Holdson

## 2017-06-12 NOTE — Progress Notes (Signed)
Patient ID: Matthew Riley, male   DOB: [birth year], 42 y.o.   MRN: [id number] Patient admitted to [phone] via wheelchair, escorted by nursing staff.  Patient oriented to unit.  He verbalizes understanding of rehab protocols including fall prevention protocol.  Patient alert and oriented x4 with only mild memory deficits observed at this time.  Patient stated he felt like his blood sugar was low, checked it and it was 37.  Treated with carbohydrate snack and rechecked to level of 72.  Patient now eating dinner.  Dani Gobble, RN

## 2017-06-12 NOTE — Progress Notes (Signed)
Report called to whitney, RN on 4W.

## 2017-06-12 NOTE — Progress Notes (Signed)
Inpatient Rehabilitation  I have received medical clearance and have a bed to offer patient today.  Plan to proceed with admission.  Discussed with team, call if questions.  Charlane Ferretti., CCC/SLP Admission Coordinator  [facility] Inpatient Rehabilitation  Cell [phone]

## 2017-06-12 NOTE — Progress Notes (Signed)
Physical Therapy Treatment Patient Details Name: Matthew Riley MRN: [id number] DOB: 1976/05/04 Today's Date: 06/12/2017    History of Present Illness 42 year old male with PMH of DM, HTN, CAD s/p stent placement, GERD, and Tobacco abuse. Admission 04/2017 for DKA. Presented to ED on 12/31 after being found down in bathroom unresponsive covered in emesis and diarrhea. Upon arrival to ED patient hypotensive and hypoxic, requiring intubated.     PT Comments    Pt making steady progress with functional mobility, requiring less assistance this session and ambulating a greater distance. However, he is still limited secondary to fatigue and weakness, and would greatly benefit from post-acute rehab for further therapy services. PT will continue to follow acutely.    Follow Up Recommendations  CIR;Supervision/Assistance - 24 hour;Other (comment)(if not accepted in CIR pt would benefit from ST rehab at [facility])     Equipment Recommendations  None recommended by PT    Recommendations for Other Services       Precautions / Restrictions Precautions Precautions: Fall Restrictions Weight Bearing Restrictions: No    Mobility  Bed Mobility               General bed mobility comments: pt OOB in recliner chair upon arrival  Transfers Overall transfer level: Needs assistance Equipment used: Rolling walker (2 wheeled) Transfers: Sit to/from Stand Sit to Stand: Min assist         General transfer comment: from recliner chair, assist for stability with transition, cueing for safe hand placement  Ambulation/Gait Ambulation/Gait assistance: Min guard Ambulation Distance (Feet): 75 Feet Assistive device: Rolling walker (2 wheeled) Gait Pattern/deviations: Step-through pattern;Decreased stride length;Trunk flexed Gait velocity: decreased Gait velocity interpretation: Below normal speed for age/gender General Gait Details: cueing for improved upright posture, mild instability with RW but no  overt LOB or need for physical assistance, min guard for safety; pt limited secondary to fatigue   Stairs            Wheelchair Mobility    Modified Rankin (Stroke Patients Only)       Balance Overall balance assessment: Needs assistance Sitting-balance support: Feet supported Sitting balance-Leahy Scale: Good     Standing balance support: Bilateral upper extremity supported;During functional activity Standing balance-Leahy Scale: Poor                              Cognition Arousal/Alertness: Awake/alert Behavior During Therapy: Flat affect Overall Cognitive Status: Within Functional Limits for tasks assessed                                        Exercises      General Comments        Pertinent Vitals/Pain Pain Assessment: No/denies pain    Home Living                      Prior Function            PT Goals (current goals can now be found in the care plan section) Acute Rehab PT Goals PT Goal Formulation: With patient Time For Goal Achievement: 06/23/17 Potential to Achieve Goals: Good Progress towards PT goals: Progressing toward goals    Frequency    Min 3X/week      PT Plan Current plan remains appropriate    Co-evaluation  AM-PAC PT "6 Clicks" Daily Activity  Outcome Measure  Difficulty turning over in bed (including adjusting bedclothes, sheets and blankets)?: None Difficulty moving from lying on back to sitting on the side of the bed? : A Little Difficulty sitting down on and standing up from a chair with arms (e.g., wheelchair, bedside commode, etc,.)?: Unable Help needed moving to and from a bed to chair (including a wheelchair)?: A Little Help needed walking in hospital room?: A Little Help needed climbing 3-5 steps with a railing? : A Lot 6 Click Score: 16    End of Session Equipment Utilized During Treatment: Gait belt Activity Tolerance: Patient tolerated treatment  well Patient left: in chair;with call bell/phone within reach Nurse Communication: Mobility status PT Visit Diagnosis: Unsteadiness on feet (R26.81);Muscle weakness (generalized) (M62.81);Other abnormalities of gait and mobility (R26.89)     Time: 4132-4401 PT Time Calculation (min) (ACUTE ONLY): 12 min  Charges:  $Gait Training: 8-22 mins                    G Codes:       [place], [place], [place] [phone]    Alessandra Bevels [name] 06/12/2017, 11:30 AM

## 2017-06-13 ENCOUNTER — Inpatient Hospital Stay (HOSPITAL_COMMUNITY): Payer: Medicaid Other | Admitting: Speech Pathology

## 2017-06-13 ENCOUNTER — Inpatient Hospital Stay (HOSPITAL_COMMUNITY): Payer: Medicaid Other

## 2017-06-13 ENCOUNTER — Inpatient Hospital Stay (HOSPITAL_COMMUNITY): Payer: Self-pay | Admitting: Occupational Therapy

## 2017-06-13 ENCOUNTER — Other Ambulatory Visit: Payer: Self-pay

## 2017-06-13 ENCOUNTER — Inpatient Hospital Stay (HOSPITAL_COMMUNITY): Payer: Self-pay | Admitting: Physical Therapy

## 2017-06-13 DIAGNOSIS — E1042 Type 1 diabetes mellitus with diabetic polyneuropathy: Secondary | ICD-10-CM

## 2017-06-13 DIAGNOSIS — R740 Nonspecific elevation of levels of transaminase and lactic acid dehydrogenase [LDH]: Secondary | ICD-10-CM

## 2017-06-13 DIAGNOSIS — I5022 Chronic systolic (congestive) heart failure: Secondary | ICD-10-CM

## 2017-06-13 DIAGNOSIS — I2510 Atherosclerotic heart disease of native coronary artery without angina pectoris: Secondary | ICD-10-CM

## 2017-06-13 DIAGNOSIS — I5021 Acute systolic (congestive) heart failure: Secondary | ICD-10-CM

## 2017-06-13 DIAGNOSIS — E162 Hypoglycemia, unspecified: Secondary | ICD-10-CM

## 2017-06-13 DIAGNOSIS — R5381 Other malaise: Principal | ICD-10-CM

## 2017-06-13 DIAGNOSIS — M069 Rheumatoid arthritis, unspecified: Secondary | ICD-10-CM

## 2017-06-13 DIAGNOSIS — R7309 Other abnormal glucose: Secondary | ICD-10-CM

## 2017-06-13 DIAGNOSIS — D72829 Elevated white blood cell count, unspecified: Secondary | ICD-10-CM

## 2017-06-13 DIAGNOSIS — E871 Hypo-osmolality and hyponatremia: Secondary | ICD-10-CM

## 2017-06-13 LAB — COMPREHENSIVE METABOLIC PANEL
ALT: 59 U/L (ref 17–63)
ANION GAP: 6 (ref 5–15)
AST: 93 U/L — ABNORMAL HIGH (ref 15–41)
Albumin: 2.2 g/dL — ABNORMAL LOW (ref 3.5–5.0)
Alkaline Phosphatase: 95 U/L (ref 38–126)
BUN: 13 mg/dL (ref 6–20)
CALCIUM: 8.3 mg/dL — AB (ref 8.9–10.3)
CHLORIDE: 98 mmol/L — AB (ref 101–111)
CO2: 25 mmol/L (ref 22–32)
Creatinine, Ser: 0.83 mg/dL (ref 0.61–1.24)
GFR calc non Af Amer: >60 mL/min (ref >60)
Glucose, Bld: 273 mg/dL — ABNORMAL HIGH (ref 65–99)
Potassium: 4.3 mmol/L (ref 3.5–5.1)
SODIUM: 129 mmol/L — AB (ref 135–145)
Total Bilirubin: 0.7 mg/dL (ref 0.3–1.2)
Total Protein: 8.2 g/dL — ABNORMAL HIGH (ref 6.5–8.1)

## 2017-06-13 LAB — CBC WITH DIFFERENTIAL/PLATELET
Basophils Absolute: 0.1 10*3/uL (ref 0.0–0.1)
Basophils Relative: 1 %
EOS ABS: 0.7 10*3/uL (ref 0.0–0.7)
EOS PCT: 4 %
HCT: 31.2 % — ABNORMAL LOW (ref 39.0–52.0)
Hemoglobin: 10.5 g/dL — ABNORMAL LOW (ref 13.0–17.0)
LYMPHS ABS: 5.1 10*3/uL — AB (ref 0.7–4.0)
Lymphocytes Relative: 30 %
MCH: 29.2 pg (ref 26.0–34.0)
MCHC: 33.7 g/dL (ref 30.0–36.0)
MCV: 86.9 fL (ref 78.0–100.0)
MONOS PCT: 8 %
Monocytes Absolute: 1.3 10*3/uL — ABNORMAL HIGH (ref 0.1–1.0)
Neutro Abs: 10 10*3/uL — ABNORMAL HIGH (ref 1.7–7.7)
Neutrophils Relative %: 57 %
PLATELETS: 390 10*3/uL (ref 150–400)
RBC: 3.59 MIL/uL — ABNORMAL LOW (ref 4.22–5.81)
RDW: 13.6 % (ref 11.5–15.5)
WBC: 17.1 10*3/uL — AB (ref 4.0–10.5)

## 2017-06-13 LAB — GLUCOSE, CAPILLARY
GLUCOSE-CAPILLARY: 190 mg/dL — AB (ref 65–99)
GLUCOSE-CAPILLARY: 92 mg/dL (ref 65–99)
GLUCOSE-CAPILLARY: 183 mg/dL — AB (ref 65–99)
Glucose-Capillary: 265 mg/dL — ABNORMAL HIGH (ref 65–99)

## 2017-06-13 LAB — MAGNESIUM: Magnesium: 1.7 mg/dL (ref 1.7–2.4)

## 2017-06-13 LAB — PHOSPHORUS: PHOSPHORUS: 3.4 mg/dL (ref 2.5–4.6)

## 2017-06-13 NOTE — Progress Notes (Signed)
Patient reporting that he is not getting enough food here and remains hungry. Dicussed that will order double portions of protein. Also get dietician to educate on food choices.

## 2017-06-13 NOTE — Evaluation (Signed)
Speech Language Pathology Assessment and Plan  Patient Details  Name: Matthew Riley MRN: [id number] Date of Birth: 1976-03-08  Evaluation Only  Today's Date: 06/13/2017 SLP Individual Time: 1215-1300 SLP Individual Time Calculation (min): 45 min   Problem List:  Patient Active Problem List   Diagnosis Date Noted  . Acute systolic heart failure ([place])   . Labile blood glucose   . Hypoglycemia   . Debility   . Neuropathic pain   . Aspiration pneumonia of both lungs ([place])   . Leukocytosis   . Acute systolic CHF (congestive heart failure) ([place])   . Ulcers of both lower extremities ([place])   . Rheumatoid arthritis ([place])   . Diabetic peripheral neuropathy associated with type 1 diabetes mellitus ([place])   . Transaminitis   . Cough   . Encephalopathy 06/03/2017  . Acute respiratory failure with hypoxia ([place])   . Septic shock ([place])   . Abdominal pain 04/16/2017  . Tobacco abuse 04/16/2017  . Leg wound, right 04/16/2017  . Bilateral leg edema 04/16/2017  . 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 CAD (coronary artery disease) 04/16/2017  . Hyperglycemia 04/16/2017  . Hypertension   . Uncontrolled type 1 diabetes mellitus with hyperglycemia ([place])   . Neuropathy   . Nausea & vomiting 03/08/2017  . GERD (gastroesophageal reflux disease) 03/08/2017  . Hyponatremia 03/08/2017  . AKI (acute kidney injury) ([place]) 03/08/2017  . Hyperkalemia 03/08/2017  . DKA, type 1 ([place])    Past Medical History:  Past Medical History:  Diagnosis Date  . Coronary artery disease   . Diabetes mellitus without complication ([place])   . GERD (gastroesophageal reflux disease)   . Hepatitis B   . Hypertension   . Renal disorder   . Rheumatoid arthritis ([place])   . Stroke [facility]) 02/2017   Past Surgical History:  Past Surgical History:  Procedure Laterality Date  . CORONARY ANGIOPLASTY WITH STENT PLACEMENT      Assessment / Plan / Recommendation Clinical Impression Matthew Riley a 42 y.o.malewith  history of HTN, CKD stage III, chronic LBP, T1DM-- uncontrolled,CVA 02/2017 (moved to [place] to be with family),CAD s/p stenting with DKA 04/2017 who was admitted on 06/03/17 after found unresponsive at home with evidence of aspiration. UDS positive for THC and benzos. History taken from chart review. Patient with reports of N/V with diarrhea and was intubated due to decreased LOC and treated for sepsis with pressors and broad spectrum antibiotics. CT chest/abdomen showed diffuse bilateral PNA. One blood culture positive for strep viridans. CT abdomen repeated showing anasarca but no abscess or abnormality. CT head reviewed, negative for acute process. He self extubated on tolerated extubation on 1/5 and respiratory status stable. Hypotension treated with fluid boluses. Electrolyte abnormality resolving and antibiotics narrowed to ceftriaxone on 01/8. BLE wounds treated with local measures as well as doxycyline--ABI's ordered for work up. Therapy ongoing and CIR recommended due to functional deficits. Pt admited to CIR on 06/12/2017. Cognitive linguistic evaluation completed on 06/13/2017. Pt demonstrated functional cognitive abilities, good vocal quality and speech intelligibility. Of note, pt consumed 8 oz of thin liquids without overt s/s of aspiration. Therefore skilled ST is not indicated at this time.    Skilled Therapeutic Interventions          Skilled treatment session focused on completion of cognitive linguistic evaluation, see above.   SLP Assessment  Patient does not need any further Speech Lanaguage Pathology Services    Recommendations  Recommendations for Other Services: Neuropsych consult Patient destination: Home  Follow up Recommendations: None Equipment Recommended: None recommended by SLP    SLP Frequency     SLP Duration  SLP Intensity  SLP Treatment/Interventions            Pain Pain Assessment Pain Assessment: 0-10 Pain Score: 0-No pain Pain Type: Chronic pain Pain  Location: Back Pain Intervention(s): Medication (See eMAR)  Prior Functioning Cognitive/Linguistic Baseline: Within functional limits Type of Home: House  Lives With: Family;Other (Comment) Available Help at Discharge: Family;Available PRN/intermittently Vocation: Unemployed  Function:    Cognition Comprehension Comprehension assist level: Follows complex conversation/direction with extra time/assistive device  Expression   Expression assist level: Expresses complex ideas: With extra time/assistive device  Social Interaction Social Interaction assist level: Interacts appropriately with others with medication or extra time (anti-anxiety, antidepressant).  Problem Solving Problem solving assist level: Solves complex problems: With extra time  Memory Memory assist level: More than reasonable amount of time   Short Term Goals: No short term goals set  Refer to Care Plan for Long Term Goals  Recommendations for other services: None   Discharge Criteria: Patient will be discharged from SLP if patient refuses treatment 3 consecutive times without medical reason, if treatment goals not met, if there is a change in medical status, if patient makes no progress towards goals or if patient is discharged from hospital.  The above assessment, treatment plan, treatment alternatives and goals were discussed and mutually agreed upon: by patient  Matthew Riley 06/13/2017, 4:51 PM

## 2017-06-13 NOTE — Progress Notes (Signed)
[facility]ICAL MEDICINE [facility] REHABILITATION     PROGRESS NOTE  Subjective/Complaints:  Patient seen lying in bed this morning. He states he slept well overnight. He is ready to begin therapies.  ROS: Denies CP, SOB, N/V/D.  Objective: Vital Signs: Blood pressure (!) 135/96, pulse 90, temperature 98.2 F (36.8 C), temperature source Oral, resp. rate 16, height 6\' 2"  (1.88 m), weight 89.7 kg (197 lb 12.4 oz), SpO2 94 %. Dg Chest 2 View  Result Date: 06/13/2017 CLINICAL DATA:  42 year old male with history of midline chest pain and productive cough for the past few days. EXAM: CHEST  2 VIEW COMPARISON:  Chest x-ray 06/11/2017. FINDINGS: Overall, there is slightly improved aeration throughout the lungs bilaterally, however, there continues to be widespread interstitial and airspace disease throughout all aspects of the lungs bilaterally, compatible with residual multilobar bronchopneumonia. No pleural effusions. No pneumothorax. No evidence of pulmonary edema. Heart size is normal. Upper mediastinal contours are within normal limits. IMPRESSION: 1. Persistent but improving multilobar bronchopneumonia. Electronically Signed   By: 08/09/2017 M.D.   On: 06/13/2017 08:32   Recent Labs    06/11/17 0523 06/12/17 0559  WBC 18.9* 16.3*  HGB 9.9* 10.8*  HCT 29.5* 30.6*  PLT 343 338   Recent Labs    06/11/17 0523 06/12/17 0559  NA 130* 127*  K 3.6 3.7  CL 94* 96*  GLUCOSE 328* 262*  BUN 17 17  CREATININE 1.04 0.86  CALCIUM 8.0* 8.2*   CBG (last 3)  Recent Labs    06/12/17 1817 06/12/17 2059 06/13/17 0626  GLUCAP 189* 308* 190*    Wt Readings from Last 3 Encounters:  06/13/17 89.7 kg (197 lb 12.4 oz)  06/11/17 92.6 kg (204 lb 2.3 oz)  05/16/17 93.7 kg (206 lb 9.6 oz)    Physical Exam:  BP (!) 135/96 (BP Location: Right Arm)   Pulse 90   Temp 98.2 F (36.8 C) (Oral)   Resp 16   Ht 6\' 2"  (1.88 m)   Wt 89.7 kg (197 lb 12.4 oz)   SpO2 94%   BMI 25.39 kg/m   Constitutional: He appears well-developed and well-nourished. No distress.  HENT: Normocephalic and atraumatic.  Eyes: EOM are normal. No discharge.  Cardiovascular: Normal rate and regular rhythm. No JVD. Respiratory: Effort normal and breath sounds normal.  GI: Bowel sounds are normal. He exhibits no distension. Musculoskeletal: He exhibits edema.  Neurological: He is alert and oriented.  Mild dysarthria.  Able to follow simple one and two step commands without difficulty. Motor: 4-4+/5 throughout  Skin: Skin is warm and dry. He is not diaphoretic.  Psychiatric: Flat.  Assessment/Plan: 1. Functional deficits secondary to debility which require 3+ hours per day of interdisciplinary therapy in a comprehensive inpatient rehab setting. Physiatrist is providing close team supervision and 24 hour management of active medical problems listed below. Physiatrist and rehab team continue to assess barriers to discharge/monitor patient progress toward functional and medical goals.  Function:  Bathing Bathing position      Bathing parts      Bathing assist        Upper Body Dressing/Undressing Upper body dressing                    Upper body assist        Lower Body Dressing/Undressing Lower body dressing  Lower body assist        Immunologist Devices: Grab bar or rail  Toileting assist Assist level: Touching or steadying assistance (Pt.75%)   Transfers Chair/bed Company secretary Comprehension    Expression    Social Interaction    Problem Solving    Memory      Medical Problem List and Plan: 1.  Deficits with mobility and ability to complete ADLs secondary to debility.   Begin CIR 2.  DVT Prophylaxis/Anticoagulation: Pharmaceutical: Lovenox 3. Pain Management:continue Ultram prn.  Resumed  Gabapentin for pain bilateral hands and BLE. 4. Mood: LCSW to follow for evaluation and support.  5. Neuropsych: This patient is capable of making decisions on his own behalf. 6. Skin/Wound Care: Maintain adequate nutritional  and hydration status.  7. Fluids/Electrolytes/Nutrition: Monitor I/O--strict.   8. Sepsis due to aspiration PNA/strep viridans bacteremia: Antibiotics narrowed to Ceftriaxone on 01/8  9. Leucocytosis: Continue to monitor for signs of infection.    PNA resolving, CXR 1/10 reviewed, showing    Labs pending 10. CAD with acute systolic CHF: Monitor weights daily. Lasix on hold due to AKI.     Filed Weights   06/12/17 1724 06/13/17 0303  Weight: 89.7 kg (197 lb 11.2 oz) 89.7 kg (197 lb 12.4 oz)  11. BLE ulcers: Due to trauma--on doxycycline.    ABI's WNL. 12.  RA: Recent diagnosis with addition of methotrexate last month.    Avoid NSAIDs with AKI and recent MI.  Hold  Methotrexate.  13. T1DM with neuropathy: Used 70/30 insulin at home with Novolog SSI. Transitioned back to home regimen and titrate as indicated.    Extremely labile with hypoglycemia yesterday, monitor with increased mobility 14. Abnormal LFTs/Recent electrolyte abnormalities: Due to GI issues and sepsis.    Labs pending 15. Hyponatremia: Progressive drop noted. Started fluid restriction and monitor.    Labs pending 16. Sore throat/Cough: On Mucinex. Added cepacol lozenges also.  17. ?GERD   Added pepcid.  LOS (Days) 1 A FACE TO FACE EVALUATION WAS PERFORMED  Matthew Riley 06/13/2017 9:14 AM

## 2017-06-13 NOTE — Evaluation (Signed)
Physical Therapy Assessment and Plan  Patient Details  Name: Matthew Riley MRN: [id number] Date of Birth: 1975-11-03  PT Diagnosis: Abnormality of gait, Ataxia, Ataxic gait, Hemiplegia dominant and Muscle weakness Rehab Potential: Excellent ELOS: 10-14 days    Today's Date: 06/13/2017 PT Individual Time: 1305-1405 PT Individual Time Calculation (min): 60 min    Problem List:  Patient Active Problem List   Diagnosis Date Noted  . Acute systolic heart failure ([place])   . Labile blood glucose   . Hypoglycemia   . Debility   . Neuropathic pain   . Aspiration pneumonia of both lungs ([place])   . Leukocytosis   . Acute systolic CHF (congestive heart failure) ([place])   . Ulcers of both lower extremities ([place])   . Rheumatoid arthritis ([place])   . Diabetic peripheral neuropathy associated with type 1 diabetes mellitus ([place])   . Transaminitis   . Cough   . Encephalopathy 06/03/2017  . Acute respiratory failure with hypoxia ([place])   . Septic shock ([place])   . Abdominal pain 04/16/2017  . Tobacco abuse 04/16/2017  . Leg wound, right 04/16/2017  . Bilateral leg edema 04/16/2017  . 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 CAD (coronary artery disease) 04/16/2017  . Hyperglycemia 04/16/2017  . Hypertension   . Uncontrolled type 1 diabetes mellitus with hyperglycemia ([place])   . Neuropathy   . Nausea & vomiting 03/08/2017  . GERD (gastroesophageal reflux disease) 03/08/2017  . Hyponatremia 03/08/2017  . AKI (acute kidney injury) ([place]) 03/08/2017  . Hyperkalemia 03/08/2017  . DKA, type 1 ([place])     Past Medical History:  Past Medical History:  Diagnosis Date  . Coronary artery disease   . Diabetes mellitus without complication ([place])   . GERD (gastroesophageal reflux disease)   . Hepatitis B   . Hypertension   . Renal disorder   . Rheumatoid arthritis ([place])   . Stroke [facility]) 02/2017   Past Surgical History:  Past Surgical History:  Procedure Laterality Date  . CORONARY ANGIOPLASTY WITH  STENT PLACEMENT      Assessment & Plan Clinical Impression: Patient is a 42 y.o.malewith history of HTN, CKD stage III, chronic LBP, T1DM-- uncontrolled,CVA 02/2017 (moved to [place] to be with family),CAD s/p stenting with DKA 04/2017 who was admitted on 06/03/17 after found unresponsive at home with evidence of aspiration. UDS positive for THC and benzos. History taken from chart review. Patient with reports of N/V with diarrhea and was intubated due to decreased LOC and treated for sepsis with pressors and broad spectrum antibiotics. CT chest/abdomen showed diffuse bilateral PNA. One blood culture positive for strep viridans. CT abdomen repeated showing anasarca but no abscess or abnormality. CT head reviewed, negative for acute process. He self extubated on tolerated extubation on 11/5 and respiratory status stable. Hypotension treated with fluid boluses. Electrolyte abnormality resolving and antibiotics narrowed to ceftriaxone on 01/8. BLE wounds treated with local measures as well as doxycyline--ABI's ordered for work up.     Patient transferred to CIR on 06/12/2017 .   Patient currently requires mod with mobility secondary to muscle weakness, decreased cardiorespiratoy endurance, unbalanced muscle activation, ataxia and decreased coordination and decreased standing balance, hemiplegia and decreased balance strategies.  Prior to hospitalization, patient was modified independent  with mobility and lived with Family, Other (Comment)(sister) in a House home.  Home access is  Level entry.  Patient will benefit from skilled PT intervention to maximize safe functional mobility, minimize fall risk and decrease caregiver burden for planned discharge  home with intermittent assist.  Anticipate patient will benefit from follow up [facility] at discharge.  PT - End of Session Activity Tolerance: Tolerates 10 - 20 min activity with multiple rests PT Assessment Rehab Potential (ACUTE/IP ONLY): Excellent PT Barriers  to Discharge: Inaccessible home environment;Decreased caregiver support PT Patient demonstrates impairments in the following area(s): Balance;Endurance;Motor;Pain;Safety;Sensory;Skin Integrity PT Transfers Functional Problem(s): Bed Mobility;Bed to Chair;Car;Furniture;Floor PT Locomotion Functional Problem(s): Ambulation;Wheelchair Mobility;Stairs PT Plan PT Intensity: Minimum of 1-2 x/day ,45 to 90 minutes PT Frequency: 5 out of 7 days PT Duration Estimated Length of Stay: 10-14 days  PT Treatment/Interventions: Ambulation/gait training;Balance/vestibular training;Community reintegration;Discharge planning;DME/adaptive equipment instruction;Disease management/prevention;Functional electrical stimulation;Functional mobility training;Neuromuscular re-education;Pain management;Patient/family education;Psychosocial support;Skin care/wound management;Therapeutic Exercise;Splinting/orthotics;Stair training;Therapeutic Activities;UE/LE Coordination activities;UE/LE Strength taining/ROM;Visual/perceptual remediation/compensation;Wheelchair propulsion/positioning PT Transfers Anticipated Outcome(s): Mod I with LRAD  PT Locomotion Anticipated Outcome(s): ambulatory at mod I with LRAD PT Recommendation Recommendations for Other Services: Neuropsych consult;Therapeutic Recreation consult Follow Up Recommendations: Home health PT Patient destination: Home Equipment Recommended: To be determined  Skilled Therapeutic Intervention Pt received supine in bed and agreeable to PT. Supine>sit transfer with supervision assist and min cues for use of bed features. PT instructed patient in PT Evaluation and initiated treatment intervention; see below for results. PT educated patient in [place], rehab potential, rehab goals, and discharge recommendations. Patient returned to room and left sitting in [facility] with call bell in reach and all needs met.       PT Evaluation Precautions/Restrictions Precautions Precautions:  Fall Restrictions Weight Bearing Restrictions: No General   Vital SignsTherapy Vitals Temp: 97.6 F (36.4 C) Temp Source: Oral Pulse Rate: (!) 111 Resp: 18 BP: 124/84 Patient Position (if appropriate): Sitting Oxygen Therapy SpO2: 100 % O2 Device: Not Delivered Pain Pain Assessment Pain Assessment: 0-10 Pain Score: 0-No pain Pain Type: Chronic pain Pain Location: Back Pain Intervention(s): Medication (See eMAR) Home Living/Prior Functioning Home Living Available Help at Discharge: Family;Available PRN/intermittently(sister works during the day) Type of Home: House Home Access: Level entry Home Layout: Two level;Bed/bath upstairs;1/2 bath on main level Alternate Level Stairs-Number of Steps: 16 Alternate Level Stairs-Rails: Left Bathroom Shower/Tub: Chiropodist: Standard  Lives With: Family;Other (Comment)(sister) Prior Function Level of Independence: Requires assistive device for independence Driving: No Comments: independent prior to 04/2017 admission. Pt has been utilizing RW following that admission and receiving HHPT.  Vision/Perception  Perception Perception: Within Functional Limits Praxis Praxis: Intact  Cognition Overall Cognitive Status: Within Functional Limits for tasks assessed Arousal/Alertness: Awake/alert Orientation Level: Oriented X4 Attention: Selective Selective Attention: Appears intact Memory: Appears intact Awareness: Appears intact Problem Solving: Appears intact Safety/Judgment: Appears intact Comments: Pt demonstrates good safety awareness and understanding of current deficits  Sensation Sensation Light Touch: Impaired by gross assessment Hot/Cold: Appears Intact Proprioception: Impaired by gross assessment Additional Comments: mild dyskinesia with BLE movements. incosistent appreciation of light touch in the RLE.  Coordination Gross Motor Movements are Fluid and Coordinated: No Fine Motor Movements are Fluid  and Coordinated: Yes Coordination and Movement Description: decreased smoothness of movements during sit<>stand and ambulation  Heel Shin Test: mild dyskinesia in the LLE.  Motor  Motor Motor: Ataxia;Hemiplegia Motor - Skilled Clinical Observations: minimal ataxic movements noted   Mobility Bed Mobility Bed Mobility: Rolling Right;Rolling Left;Supine to Sit Rolling Right: 5: Supervision Rolling Left: 5: Supervision Supine to Sit: 5: Supervision Transfers Transfers: Yes Sit to Stand: 4: Min assist Sit to Stand Details: Verbal cues for precautions/safety;Verbal cues for safe use of DME/AE Stand Pivot Transfers: 4: Min  assist Stand Pivot Transfer Details: Verbal cues for precautions/safety;Verbal cues for safe use of DME/AE Locomotion  Ambulation Ambulation: Yes Ambulation/Gait Assistance: 4: Min assist. Mod assist with increased distance.  Ambulation Distance (Feet): 150 Feet Assistive device: Rolling walker Gait Gait: Yes Gait Pattern: Ataxic;Narrow base of support Stairs / Additional Locomotion Stairs: Yes Stairs Assistance: 4: Min assist Number of Stairs: 8 Height of Stairs: 6 Wheelchair Mobility Wheelchair Mobility: Yes Wheelchair Assistance: 5: Investment banker, operational Details: Verbal cues for precautions/safety;Verbal cues for safe use of DME/AE Wheelchair Propulsion: Both upper extremities Wheelchair Parts Management: Needs assistance  Trunk/Postural Assessment     Balance Balance Balance Assessed: Yes Static Sitting Balance Static Sitting - Balance Support: Feet unsupported Static Sitting - Level of Assistance: 6: Modified independent (Device/Increase time) Dynamic Sitting Balance Dynamic Sitting - Balance Support: Feet unsupported;No upper extremity supported Dynamic Sitting - Level of Assistance: 5: Stand by assistance Static Standing Balance Static Standing - Balance Support: No upper extremity supported Static Standing - Level of Assistance: 4:  Min assist Dynamic Standing Balance Dynamic Standing - Balance Support: No upper extremity supported Dynamic Standing - Level of Assistance: 3: Mod assist Extremity Assessment      RLE Assessment RLE Assessment: Exceptions to WFL(grossly 4+/5 proximal to distal) LLE Assessment LLE Assessment: Exceptions to [facility] LLE Strength LLE Overall Strength Comments: 4/5 grossly proximal to distal   See Function Navigator for Current Functional Status.   Refer to Care Plan for Long Term Goals  Recommendations for other services: Neuropsych and Therapeutic Recreation  Stress management  Discharge Criteria: Patient will be discharged from PT if patient refuses treatment 3 consecutive times without medical reason, if treatment goals not met, if there is a change in medical status, if patient makes no progress towards goals or if patient is discharged from hospital.  The above assessment, treatment plan, treatment alternatives and goals were discussed and mutually agreed upon: by patient  Lorie Phenix 06/13/2017, 2:10 PM

## 2017-06-13 NOTE — Progress Notes (Signed)
Patient information reviewed and entered into eRehab system by Melynda Krzywicki, RN, CRRN, PPS Coordinator.  Information including medical coding and functional independence measure will be reviewed and updated through discharge.    

## 2017-06-13 NOTE — Evaluation (Signed)
Occupational Therapy Assessment and Plan  Patient Details  Name: Casey Fye MRN: [id number] Date of Birth: 03-18-1976  OT Diagnosis: ataxia and muscle weakness (generalized) Rehab Potential: Rehab Potential (ACUTE ONLY): Good ELOS: 10-12 days    Today's Date: 06/13/2017 OT Individual Time: 0037-0488 OT Individual Time Calculation (min): 85 min     Problem List:  Patient Active Problem List   Diagnosis Date Noted  . Acute systolic heart failure ([place])   . Labile blood glucose   . Hypoglycemia   . Debility   . Neuropathic pain   . Aspiration pneumonia of both lungs ([place])   . Leukocytosis   . Acute systolic CHF (congestive heart failure) ([place])   . Ulcers of both lower extremities ([place])   . Rheumatoid arthritis ([place])   . Diabetic peripheral neuropathy associated with type 1 diabetes mellitus ([place])   . Transaminitis   . Cough   . Encephalopathy 06/03/2017  . Acute respiratory failure with hypoxia ([place])   . Septic shock ([place])   . Abdominal pain 04/16/2017  . Tobacco abuse 04/16/2017  . Leg wound, right 04/16/2017  . Bilateral leg edema 04/16/2017  . 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 CAD (coronary artery disease) 04/16/2017  . Hyperglycemia 04/16/2017  . Hypertension   . Uncontrolled type 1 diabetes mellitus with hyperglycemia ([place])   . Neuropathy   . Nausea & vomiting 03/08/2017  . GERD (gastroesophageal reflux disease) 03/08/2017  . Hyponatremia 03/08/2017  . AKI (acute kidney injury) ([place]) 03/08/2017  . Hyperkalemia 03/08/2017  . DKA, type 1 ([place])     Past Medical History:  Past Medical History:  Diagnosis Date  . Coronary artery disease   . Diabetes mellitus without complication ([place])   . GERD (gastroesophageal reflux disease)   . Hepatitis B   . Hypertension   . Renal disorder   . Rheumatoid arthritis ([place])   . Stroke [facility]) 02/2017   Past Surgical History:  Past Surgical History:  Procedure Laterality Date  . CORONARY ANGIOPLASTY WITH STENT  PLACEMENT      Assessment & Plan Clinical Impression: Patient is a 42 y.o. year old malewith history of HTN, CKD stage III, chronic LBP, T1DM-- uncontrolled,CVA 02/2017 (moved to [place] to be with family),CAD s/p stenting with DKA 04/2017 who was admitted on 06/03/17 after found unresponsive at home with evidence of aspiration. UDS positive for THC and benzos. History taken from chart review. Patient with reports of N/V with diarrhea and was intubated due to decreased LOC and treated for sepsis with pressors and broad spectrum antibiotics. CT chest/abdomen showed diffuse bilateral PNA. One blood culture positive for strep viridans. CT abdomen repeated showing anasarca but no abscess or abnormality. CT head reviewed, negative for acute process. He self extubated on tolerated extubation on 11/5 and respiratory status stable. Hypotension treated with fluid boluses. Electrolyte abnormality resolving and antibiotics narrowed to ceftriaxone on 01/8. BLE wounds treated with local measures as well as doxycyline--ABI's ordered for work up.  Therapy ongoing and CIR recommended due to functional deficits.   Patient transferred to CIR on 06/12/2017 .    Patient currently requires min with basic self-care skills secondary to muscle weakness, decreased cardiorespiratoy endurance, ataxia and decreased standing balance, decreased postural control and decreased balance strategies.  Prior to hospitalization, patient could complete basic ADLs/simple iADLs with modified independent .  Patient will benefit from skilled intervention to decrease level of assist with basic self-care skills, increase independence with basic self-care skills and increase level of independence with  iADL prior to discharge home with care partner.  Anticipate patient will require intermittent supervision and follow up home health.  OT - End of Session Activity Tolerance: Tolerates 30+ min activity with multiple rests Endurance Deficit: Yes OT  Assessment Rehab Potential (ACUTE ONLY): Good OT Patient demonstrates impairments in the following area(s): Balance;Endurance;Motor OT Basic ADL's Functional Problem(s): Grooming;Dressing;Toileting OT Advanced ADL's Functional Problem(s): Simple Meal Preparation OT Transfers Functional Problem(s): Toilet;Tub/Shower OT Additional Impairment(s): None OT Plan OT Intensity: Minimum of 1-2 x/day, 45 to 90 minutes OT Frequency: 5 out of 7 days OT Duration/Estimated Length of Stay: 10-12 days  OT Treatment/Interventions: Balance/vestibular training;Community reintegration;Disease mangement/prevention;Patient/family education;Self Care/advanced ADL retraining;Therapeutic Exercise;UE/LE Coordination activities;Discharge planning;DME/adaptive equipment instruction;Functional mobility training;Psychosocial support;Therapeutic Activities;UE/LE Strength taining/ROM OT Self Feeding Anticipated Outcome(s): mod independent OT Basic Self-Care Anticipated Outcome(s): mod independent OT Toileting Anticipated Outcome(s): mod independent OT Bathroom Transfers Anticipated Outcome(s): mod independent OT Recommendation Recommendations for Other Services: Therapeutic Recreation consult Therapeutic Recreation Interventions: [place] group Patient destination: Home Follow Up Recommendations: Home health OT Equipment Recommended: To be determined Equipment Details: suspect may need TTB    Skilled Therapeutic Intervention Pt seen for OT eval with explanation of OT purpose, CIR process, and discussion of Pt's goals/POC during CIR stay.  OT tx completed with focus on ADL/self-care retraining, functional transfers, and activity tolerance. Pt ambulates within room at RW level with MinA, transfers to 3:1 in shower using grab bars with steadying assist. Pt completes bathing at shower level with MinA provided in standing and assist to wash back. Pt ambulates back to EOB to complete UB/LB dressing. Issued pt paper scrubs as  Pt does not yet have clothing. Pt dons socks and pants with increased time, MinA in standing while Pt advances pants over hips; dons overhead shirt with setup assist. Pt with decreased activity tolerance, requiring seated rest breaks throughout. Pt transfers to w/c, alternating between Pt propelling w/c with S for increased UB strength/endurance and assisting to propel w/c when Pt required rest break - oriented Pt to areas within CIR unit and various gyms/ADL apartment. Pt propels w/c back to room approx 50% distance prior to needing rest break with therapist assisting with remaining distance. Pt left seated in w/c, lunch tray setup, call bell and needs within reach.   OT Evaluation Precautions/Restrictions  Precautions Precautions: Fall Restrictions Weight Bearing Restrictions: No    Vital Signs Therapy Vitals Temp: 97.6 F (36.4 C) Temp Source: Oral Pulse Rate: (!) 111 Resp: 18 BP: 124/84 Patient Position (if appropriate): Sitting Oxygen Therapy SpO2: 100 % O2 Device: Not Delivered Pain Pain Assessment Pain Assessment: 0-10 Pain Score: 0-No pain Pain Type: Chronic pain Pain Location: Back Pain Intervention(s): Medication (See eMAR) Home Living/Prior [place] expects to be discharged to:: Private residence Living Arrangements: Other relatives Available Help at Discharge: Family, Available PRN/intermittently(sister works during the day) Type of Home: House Home Access: Level entry Home Layout: Two level, Bed/bath upstairs, 1/2 bath on main level Alternate Level Stairs-Number of Steps: 16 Alternate Level Stairs-Rails: Left Bathroom Shower/Tub: Chiropodist: Standard  Lives With: Family, Other (Comment)(sister) IADL History Homemaking Responsibilities: Yes Meal Prep Responsibility: Primary Shopping Responsibility: Secondary Homemaking Comments: Pt reports he fixes his own meals; sister drives Pt to store for grocery  shopping Mode of Transportation: Family, Other (comment)(sister drives; Pt recently submitted application for [employer]) Leisure and Hobbies: fishing, watching football  IADL Comments: simple meal prep  Prior Function Level of Independence: Requires assistive device for independence Driving: No  Comments: independent prior to 04/2017 admission. Pt has been utilizing RW following that admission and receiving HHPT.  ADL ADL ADL Comments: Please see functional navigator for ADL status  Vision Baseline Vision/History: No visual deficits(reports need for glasses) Patient Visual Report: No change from baseline Vision Assessment?: No apparent visual deficits Perception  Perception: Within Functional Limits Praxis Praxis: Intact Cognition Overall Cognitive Status: Within Functional Limits for tasks assessed Arousal/Alertness: Awake/alert Orientation Level: Person;Place;Situation Person: Oriented Place: Oriented Situation: Oriented Year: 2018 Month: January Day of Week: Correct Memory: Appears intact Immediate Memory Recall: Sock;Blue;Bed Memory Recall: Sock;Blue;Bed Memory Recall Sock: Without Cue Memory Recall Blue: Without Cue Memory Recall Bed: Without Cue Attention: Selective Selective Attention: Appears intact Awareness: Appears intact Problem Solving: Appears intact Safety/Judgment: Appears intact Comments: Pt demonstrates good safety awareness and understanding of current deficits  Sensation Sensation Light Touch: Impaired by gross assessment Hot/Cold: Appears Intact Proprioception: Impaired by gross assessment Additional Comments: mild dyskinesia with BLE movements. incosistent appreciation of light touch in the RLE.  Coordination Gross Motor Movements are Fluid and Coordinated: No Fine Motor Movements are Fluid and Coordinated: Yes Coordination and Movement Description: decreased smoothness of movements during sit<>stand and ambulation  Heel Shin Test: mild dyskinesia in  the LLE.  Motor  Motor Motor: Ataxia;Hemiplegia Motor - Skilled Clinical Observations: minimal ataxic movements noted  Mobility  Bed Mobility Bed Mobility: Rolling Right;Rolling Left;Supine to Sit Rolling Right: 5: Supervision Rolling Left: 5: Supervision Supine to Sit: 5: Supervision Transfers Transfers: Sit to Stand;Stand to Sit Sit to Stand: 4: Min assist Sit to Stand Details: Verbal cues for precautions/safety;Verbal cues for safe use of DME/AE Stand to Sit: 4: Min assist Stand to Sit Details (indicate cue type and reason): Tactile cues for posture;Verbal cues for safe use of DME/AE;Verbal cues for precautions/safety  Trunk/Postural Assessment  Cervical Assessment Cervical Assessment: Exceptions to WFL(forward head) Thoracic Assessment Thoracic Assessment: Exceptions to WFL(rounded shoulders ) Lumbar Assessment Lumbar Assessment: Within Functional Limits Postural Control Postural Control: Deficits on evaluation Righting Reactions: delayed   Balance Balance Balance Assessed: Yes Static Sitting Balance Static Sitting - Balance Support: Feet unsupported Static Sitting - Level of Assistance: 6: Modified independent (Device/Increase time) Dynamic Sitting Balance Dynamic Sitting - Balance Support: Feet unsupported;No upper extremity supported Dynamic Sitting - Level of Assistance: 5: Stand by assistance Static Standing Balance Static Standing - Balance Support: No upper extremity supported Static Standing - Level of Assistance: 4: Min assist Dynamic Standing Balance Dynamic Standing - Balance Support: No upper extremity supported Dynamic Standing - Level of Assistance: 3: Mod assist Extremity/Trunk Assessment RUE Assessment RUE Assessment: Exceptions to [facility] RUE Strength RUE Overall Strength Comments: gross weakness (grossly 4/5); decreased grip strength  LUE Assessment LUE Assessment: Exceptions to [facility] LUE Strength LUE Overall Strength Comments: generalized weakness  (L>R), grossly 4-/5; decreased grip strength    See Function Navigator for Current Functional Status.   Refer to Care Plan for Long Term Goals  Recommendations for other services: Therapeutic Recreation  Kitchen group   Discharge Criteria: Patient will be discharged from OT if patient refuses treatment 3 consecutive times without medical reason, if treatment goals not met, if there is a change in medical status, if patient makes no progress towards goals or if patient is discharged from hospital.  The above assessment, treatment plan, treatment alternatives and goals were discussed and mutually agreed upon: by patient  Raymondo Band 06/13/2017, 3:59 PM

## 2017-06-14 ENCOUNTER — Inpatient Hospital Stay (HOSPITAL_COMMUNITY): Payer: Self-pay | Admitting: Physical Therapy

## 2017-06-14 ENCOUNTER — Inpatient Hospital Stay (HOSPITAL_COMMUNITY): Payer: Self-pay | Admitting: Occupational Therapy

## 2017-06-14 ENCOUNTER — Inpatient Hospital Stay (HOSPITAL_COMMUNITY): Payer: Medicaid Other | Admitting: Occupational Therapy

## 2017-06-14 DIAGNOSIS — D62 Acute posthemorrhagic anemia: Secondary | ICD-10-CM

## 2017-06-14 LAB — GLUCOSE, CAPILLARY
GLUCOSE-CAPILLARY: 206 mg/dL — AB (ref 65–99)
GLUCOSE-CAPILLARY: 110 mg/dL — AB (ref 65–99)
GLUCOSE-CAPILLARY: 391 mg/dL — AB (ref 65–99)
Glucose-Capillary: 146 mg/dL — ABNORMAL HIGH (ref 65–99)
Glucose-Capillary: 42 mg/dL — CL (ref 65–99)

## 2017-06-14 MED ORDER — INSULIN ASPART PROT & ASPART (70-30 MIX) 100 UNIT/ML ~~LOC~~ SUSP
30.0000 [IU] | Freq: Every day | SUBCUTANEOUS | Status: DC
  Administered 2017-06-14 – 2017-06-16 (×3): 30 [IU] via SUBCUTANEOUS
  Filled 2017-06-14: qty 10

## 2017-06-14 MED ORDER — INSULIN ASPART PROT & ASPART (70-30 MIX) 100 UNIT/ML ~~LOC~~ SUSP
25.0000 [IU] | Freq: Every day | SUBCUTANEOUS | Status: DC
  Administered 2017-06-15 – 2017-06-17 (×3): 25 [IU] via SUBCUTANEOUS
  Filled 2017-06-14: qty 10

## 2017-06-14 MED ORDER — DOXYCYCLINE HYCLATE 100 MG PO TABS
100.0000 mg | ORAL_TABLET | Freq: Two times a day (BID) | ORAL | Status: DC
Start: 2017-06-14 — End: 2017-06-17
  Administered 2017-06-14 – 2017-06-16 (×6): 100 mg via ORAL
  Filled 2017-06-14 (×6): qty 1

## 2017-06-14 MED ORDER — GLUCOSE 40 % PO GEL
ORAL | Status: AC
  Administered 2017-06-14: 37.5 g
  Filled 2017-06-14: qty 1

## 2017-06-14 NOTE — Progress Notes (Signed)
Social Work  Social Work Assessment and Plan  Patient Details  Name: Matthew Riley MRN: [id number] Date of Birth: 05/07/1976  Today's Date: 06/14/2017  Problem List:  Patient Active Problem List   Diagnosis Date Noted  . Bacteremia   . PNA (pneumonia)   . Acute blood loss anemia   . Acute systolic heart failure (HCC)   . Labile blood glucose   . Hypoglycemia   . Debility   . Neuropathic pain   . Aspiration pneumonia of both lungs (HCC)   . Leukocytosis   . Acute systolic CHF (congestive heart failure) (HCC)   . Ulcers of both lower extremities (HCC)   . Rheumatoid arthritis (HCC)   . 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 Transaminitis   . Cough   . Encephalopathy 06/03/2017  . Acute respiratory failure with hypoxia (HCC)   . Septic shock (HCC)   . Abdominal pain 04/16/2017  . Tobacco abuse 04/16/2017  . Leg wound, right 04/16/2017  . Bilateral leg edema 04/16/2017  . 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 CAD (coronary artery disease) 04/16/2017  . Hyperglycemia 04/16/2017  . Hypertension   . Uncontrolled type 1 diabetes mellitus with hyperglycemia (HCC)   . Neuropathy   . Nausea & vomiting 03/08/2017  . GERD (gastroesophageal reflux disease) 03/08/2017  . Hyponatremia 03/08/2017  . AKI (acute kidney injury) (HCC) 03/08/2017  . Hyperkalemia 03/08/2017  . DKA, type 1 (HCC)    Past Medical History:  Past Medical History:  Diagnosis Date  . Coronary artery disease   . Diabetes mellitus without complication (HCC)   . GERD (gastroesophageal reflux disease)   . Hepatitis B   . Hypertension   . Renal disorder   . Rheumatoid arthritis (HCC)   . Stroke [facility]) 02/2017   Past Surgical History:  Past Surgical History:  Procedure Laterality Date  . CORONARY ANGIOPLASTY WITH STENT PLACEMENT     Social History:  reports that he has quit smoking. he has never used smokeless tobacco. He reports that he does not drink alcohol or  use drugs.  Family / Support Systems Marital Status: Single Patient Roles: Parent Children: daughter, Drake Leach (local) @ (C) [phone] Other Supports: sister, Cardell Peach (pt lives with) @ (C) [phone] Anticipated Caregiver: Mod I goals - sister and daughter to provide support as needed Ability/Limitations of Caregiver: Sister and local family all work Engineer, structural Availability: Evenings only Family Dynamics: Pt describes very close relationship with his family.  He describes sister, daughter and niece who live locally as very supportive.  Social History Preferred language: English Religion: None Cultural Background: NA Education: High school Read: Yes Write: Yes Employment Status: Unemployed Fish farm manager Issues: None Guardian/Conservator: None-Per MD, patient is capable of making decisions on his own behalf   Abuse/Neglect Abuse/Neglect Assessment Can Be Completed: Yes Physical Abuse: Denies Verbal Abuse: Denies Sexual Abuse: Denies Exploitation of patient/patient's resources: Denies Self-Neglect: Denies  Emotional Status Pt's affect, behavior adn adjustment status: Patient extremely pleasant and able to complete psychosocial assessment without any difficulty.  He reports he is motivated for therapies and hopeful he will regain his independence.  Patient denies any significant general distress at this time,.  However, neuropsychology will see him for additional support Recent Psychosocial Issues: Patient moved to [place] after his stroke in September 2018 to be closer to family. Pyschiatric History: None Substance Abuse History: None  Patient / Family Perceptions, Expectations & Goals Pt/Family understanding of illness &  functional limitations: Patient with basic understanding of probable aspiration at home which developed into sepsis.  Patient and family with good understanding of patient's current functional deficits and need for  CIR. Premorbid pt/family roles/activities: Patient was independent overall PTA Anticipated changes in roles/activities/participation: Little change anticipated to roles if patient able to reach a mod independent goals. Pt/family expectations/goals: "I want to be able to do as much for myself as possible."  [employer]: Other (Comment)( [facility] for primary medical home) Premorbid Home Care/DME Agencies: Other (Comment)([facility] following PTA for home therapies) Transportation available at discharge: Patient has assistance from family for transportation as well as [facility] services. Resource referrals recommended: Neuropsychology  Discharge Planning Living Arrangements: Other relatives Support Systems: Children, Other relatives Type of Residence: Private residence Insurance Resources: Customer service manager Resources: Family Support Financial Screen Referred: Previously completed Living Expenses: Lives with family Money Management: Patient Does the patient have any problems obtaining your medications?: No(can access pharmacy at [facility]) Home Management: Patient and family share responsibilities. Patient/Family Preliminary Plans: Plan for patient to DC home with sister who does work full-time, therefore, it is noted that patient will be alone during the day. Social Work Anticipated Follow Up Needs: HH/OP Expected length of stay: 10-12 days  Clinical Impression Very pleasant gentleman here with debility following treatment for sepsis.  Able to complete assessment and interview without any difficulty and no obvious signs of any cognitive deficits.  Patient is very motivated for therapies and hopeful he will be able to reach an independent level of function.  Patient does live with his sister, however, she does work full-time and he will be alone for most of the day.  Patient denies any significant emotional distress.  Will follow for  discharge planning and support needs.  Roopa Graver 06/14/2017, 12:41 PM

## 2017-06-14 NOTE — Progress Notes (Signed)
Nutrition Brief Note  RD consulted for diet education regarding food choices. Diet has just been changed to a carbohydrate modified with 2 gram sal restriction. Pt reports hunger throughout the day despite 100% meal completion. Kitchen to send double portions at meals. Pt reports he however has not been receiving double portions. RD to make note in [facility] program for the kitchen to ensure pt receives his double portions. Discussed foods choices appropriate for pt's diet available on the menu. Pt additionally has Premier Protein shakes and Prostat ordered and has been consuming them. RD to continue with current orders. Labs and medications reviewed. No further nutrition interventions at this time. May re-consult RD if nutritional issues arise.  Roslyn Smiling, MS, RD, LDN Pager # [phone] After hours/ weekend pager # [phone]

## 2017-06-14 NOTE — Progress Notes (Signed)
Physical Therapy Note  Patient Details  Name: Matthew Riley MRN: [id number] Date of Birth: January 25, 1976 Today's Date: 06/14/2017    Time: 1026-1126 60 minutes  1:1 No c/o pain.  Pt performs gait with RW 100' with min A, mild ataxia noted, heavy reliance on UEs on RW.  Gait without RW 100', 50' x 2 with min A, manual facilitation for trunk and pelvis stability, mod A to prevent LOB with turning to sit. Quadruped alt UE and LE lifts for core stability with min A.  Tall kneeling trunk PNFs and ball toss for continued balance and core stability.  Supine bridge with adduction squeeze and knee ext with adduction squeeze with pt requiring increased time but improving coordination with repetition.  Sit to stand repetitions without UE support with min A.  Standing kinetron 3 x 10 reps for balance and strengthening.  Pt states he feels "tired" after exercise but no c/o pain.   Jacole Capley 06/14/2017, 11:40 AM

## 2017-06-14 NOTE — Plan of Care (Signed)
Total assist for wound care and dressing changes to BLE.

## 2017-06-14 NOTE — Progress Notes (Signed)
Occupational Therapy Session Note  Patient Details  Name: Matthew Riley MRN: [id number] Date of Birth: 1976-02-01  Today's Date: 06/14/2017 OT Individual Time: 7939-0300 OT Individual Time Calculation (min): 60 min    Short Term Goals: Week 1:  OT Short Term Goal 1 (Week 1): Pt will transfer to/from toilet with supervision. OT Short Term Goal 2 (Week 1): Pt will complete showering at sit<>stand level maintaining standing balance with Minguard assist.  OT Short Term Goal 3 (Week 1): Pt will complete 3/3 parts of LB dressing with supervision. OT Short Term Goal 4 (Week 1): Pt will engage in standing activity >17mn prior to needing seated rest break.   Skilled Therapeutic Interventions/Progress Updates:    Pt seen for ADL training with a focus on activity tolerance and dynamic standing balance. Pt received in bed and sat to EOB without A.  Sit to stand with S, then ambulated with RW to shower with close S.  He used the grab bars to stabilize himself.  Instability noted in LLE.  Pt urinated into urinal in order to measure is out put.  He was then able to shower seated on shower seat with set up only. Discussed the need for a tub bench at home as he has a tub.   Pt ambulated back to bed to dress and then to sink to groom.  In standing at sink, pt tends to lock out his L knee - cued to keep knees soft for increased balance control. He also locks out both knees and bends from the waist to spit into the sink putting more pressure on his low back. Cued to squat slightly and pt was able to do so with S.  Worked on 10 small squats at the sink to strengthen legs.  Pt returned to bed to rest with all needs met.  Bed alarm set.  Therapy Documentation Precautions:  Precautions Precautions: Fall Restrictions Weight Bearing Restrictions: No   Pain: Pain Assessment Pain Assessment: No/denies pain ADL: ADL ADL Comments: Please see functional navigator for ADL status     See Function Navigator for  Current Functional Status.   Therapy/Group: Individual Therapy  SS[place]ven1/04/2018, 8:53 AM

## 2017-06-14 NOTE — Significant Event (Signed)
Hypoglycemic Event  CBG: 42  Treatment: 15 GM carbohydrate snack and 15 GM gel  Symptoms: Sweaty, Shaky, Hungry and Nervous/irritable  Follow-up CBG: Time:1722 CBG Result:110  Possible Reasons for Event: Medication regimen: 70/30 novolog, sliding scale insulin  Comments/MD notified:Pamela Love PA notified     [facility]

## 2017-06-14 NOTE — Progress Notes (Addendum)
Occupational Therapy Session Note  Patient Details  Name: Matthew Riley MRN: [id number] Date of Birth: 1975-10-09  Today's Date: 06/14/2017 OT Individual Time: 1130-1200 OT Individual Time Calculation (min): 30 min    Short Term Goals: Week 1:  OT Short Term Goal 1 (Week 1): Pt will transfer to/from toilet with supervision. OT Short Term Goal 2 (Week 1): Pt will complete showering at sit<>stand level maintaining standing balance with Minguard assist.  OT Short Term Goal 3 (Week 1): Pt will complete 3/3 parts of LB dressing with supervision. OT Short Term Goal 4 (Week 1): Pt will engage in standing activity >104min prior to needing seated rest break.   Skilled Therapeutic Interventions/Progress Updates:    1:1 Focus on problem solve safe tub transfer situation for d/c to his sister' s house. Pt performed min A transfer into tub with tub bench. Introduced suggestions of suction grab bar with instructions about precuations. Pt would discuss tub bench with sister as a safer option.  Ambulated back to his room - half way with the RW with min guard and the other half without RW with min/ mod A at very slow pace to maintain control.  2nd session  13:00-13:45 1:1 therapeutic activity: focus on fall recovery and floor transfers -address core stability and activation in quadruped and then progressed to lifting alternating limbs  -modififed sit ups and bridges continuing to work on core -bilateral UE strengthening with 6 lb hand weights -SciFit UB exercises on random levels for 4 min in standing  Ambulated back to room with min guard with RW.   Therapy Documentation Precautions:  Precautions Precautions: Fall Restrictions Weight Bearing Restrictions: No General:   Vital Signs: Therapy Vitals Temp: (!) 97.2 F (36.2 C) Temp Source: Oral Pulse Rate: 96 Resp: 20 BP: 119/86 Patient Position (if appropriate): Lying Oxygen Therapy SpO2: 98 % O2 Device: Not Delivered Pain:  no c/o  pain ADL: ADL ADL Comments: Please see functional navigator for ADL status   See Function Navigator for Current Functional Status.   Therapy/Group: Individual Therapy  [facility] 06/14/2017, 2:09 PM

## 2017-06-14 NOTE — Progress Notes (Signed)
Patient with low blood sugar this evening again--will decrease am 70/30 to 25 units. He is now getting double portions as ordered and should help with BS stabilization and management.

## 2017-06-14 NOTE — Progress Notes (Signed)
[facility] PHYSICAL MEDICINE & REHABILITATION     PROGRESS NOTE  Subjective/Complaints:  Pt seen sitting up in bed this AM.  He states he slept well overnight, but continues to complain about portion of meals.    ROS: Denies CP, SOB, N/V/D.  Objective: Vital Signs: Blood pressure (!) 142/95, pulse 86, temperature 97.9 F (36.6 C), temperature source Oral, resp. rate 17, height 6\' 2"  (1.88 m), weight 89.4 kg (197 lb), SpO2 98 %. Dg Chest 2 View  Result Date: 06/13/2017 CLINICAL DATA:  42 year old male with history of midline chest pain and productive cough for the past few days. EXAM: CHEST  2 VIEW COMPARISON:  Chest x-ray 06/11/2017. FINDINGS: Overall, there is slightly improved aeration throughout the lungs bilaterally, however, there continues to be widespread interstitial and airspace disease throughout all aspects of the lungs bilaterally, compatible with residual multilobar bronchopneumonia. No pleural effusions. No pneumothorax. No evidence of pulmonary edema. Heart size is normal. Upper mediastinal contours are within normal limits. IMPRESSION: 1. Persistent but improving multilobar bronchopneumonia. Electronically Signed   By: Trudie Reed M.D.   On: 06/13/2017 08:32   Recent Labs    06/12/17 0559 06/13/17 0750  WBC 16.3* 17.1*  HGB 10.8* 10.5*  HCT 30.6* 31.2*  PLT 338 390   Recent Labs    06/12/17 0559 06/13/17 0750  NA 127* 129*  K 3.7 4.3  CL 96* 98*  GLUCOSE 262* 273*  BUN 17 13  CREATININE 0.86 0.83  CALCIUM 8.2* 8.3*   CBG (last 3)  Recent Labs    06/13/17 1636 06/13/17 2041 06/14/17 0630  GLUCAP 92 183* 206*    Wt Readings from Last 3 Encounters:  06/14/17 89.4 kg (197 lb)  06/11/17 92.6 kg (204 lb 2.3 oz)  05/16/17 93.7 kg (206 lb 9.6 oz)    Physical Exam:  BP (!) 142/95 (BP Location: Right Arm)   Pulse 86   Temp 97.9 F (36.6 C) (Oral)   Resp 17   Ht 6\' 2"  (1.88 m)   Wt 89.4 kg (197 lb)   SpO2 98%   BMI 25.29 kg/m  Constitutional:  He appears well-developed and well-nourished. No distress.  HENT: Normocephalic and atraumatic.  Eyes: EOM are normal. No discharge.  Cardiovascular: RRR. No JVD. Respiratory: Effort normal and breath sounds normal.  GI: Bowel sounds are normal. He exhibits no distension. Musculoskeletal: He exhibits edema.  Neurological: He is alert and oriented.  Mild dysarthria.  Able to follow simple one and two step commands without difficulty. Motor: 4+/5 throughout  Skin: Skin is warm and dry. He is not diaphoretic.  Psychiatric: Flat.  Assessment/Plan: 1. Functional deficits secondary to debility which require 3+ hours per day of interdisciplinary therapy in a comprehensive inpatient rehab setting. Physiatrist is providing close team supervision and 24 hour management of active medical problems listed below. Physiatrist and rehab team continue to assess barriers to discharge/monitor patient progress toward functional and medical goals.  Function:  Bathing Bathing position   Position: Shower  Bathing parts Body parts bathed by patient: Right arm, Left arm, Chest, Abdomen, Front perineal area, Buttocks, Right upper leg, Left upper leg, Right lower leg, Left lower leg Body parts bathed by helper: Back  Bathing assist Assist Level: Touching or steadying assistance(Pt > 75%)      Upper Body Dressing/Undressing Upper body dressing   What is the patient wearing?: Pull over shirt/dress     Pull over shirt/dress - Perfomed by patient: Thread/unthread right sleeve, Thread/unthread left  sleeve, Put head through opening, Pull shirt over trunk          Upper body assist Assist Level: Set up      Lower Body Dressing/Undressing Lower body dressing   What is the patient wearing?: Non-skid slipper socks, Pants     Pants- Performed by patient: Thread/unthread right pants leg, Thread/unthread left pants leg, Pull pants up/down   Non-skid slipper socks- Performed by patient: Don/doff right sock,  Don/doff left sock                    Lower body assist Assist for lower body dressing: Touching or steadying assistance (Pt > 75%)      Toileting Toileting   Toileting steps completed by patient: Adjust clothing prior to toileting, Performs perineal hygiene, Adjust clothing after toileting   Toileting Assistive Devices: Grab bar or rail  Toileting assist Assist level: Touching or steadying assistance (Pt.75%)   Transfers Chair/bed transfer Chair/bed transfer activity did not occur: N/A Chair/bed transfer method: Stand pivot Chair/bed transfer assist level: Moderate assist (Pt 50 - 74%/lift or lower) Chair/bed transfer assistive device: Armrests, Patent attorney     Max distance: 131ft  Assist level: Moderate assist (Pt 50 - 74%)   Wheelchair     Max wheelchair distance: 165ft  Assist Level: Supervision or verbal cues  Cognition Comprehension Comprehension assist level: Follows complex conversation/direction with extra time/assistive device  Expression Expression assist level: Expresses complex ideas: With extra time/assistive device  Social Interaction Social Interaction assist level: Interacts appropriately with others with medication or extra time (anti-anxiety, antidepressant).  Problem Solving Problem solving assist level: Solves complex problems: With extra time  Memory Memory assist level: More than reasonable amount of time    Medical Problem List and Plan: 1.  Deficits with mobility and ability to complete ADLs secondary to debility.   Cont CIR 2.  DVT Prophylaxis/Anticoagulation: Pharmaceutical: Lovenox 3. Pain Management:continue Ultram prn.  Resumed Gabapentin for pain bilateral hands and BLE. 4. Mood: LCSW to follow for evaluation and support.  5. Neuropsych: This patient is capable of making decisions on his own behalf. 6. Skin/Wound Care: Maintain adequate nutritional  and hydration status.  7. Fluids/Electrolytes/Nutrition: Monitor  I/O--strict.   8. Sepsis due to aspiration PNA/strep viridans bacteremia: Ceftriaxone and Doxy  9. Leucocytosis: Continue to monitor for signs of infection.    PNA resolving, CXR 1/10 reviewed, showing improving PNA   Labs ordered for Monday 10. CAD with acute systolic CHF: Monitor weights daily. Lasix on hold due to AKI.     Filed Weights   06/12/17 1724 06/13/17 0303 06/14/17 0423  Weight: 89.7 kg (197 lb 11.2 oz) 89.7 kg (197 lb 12.4 oz) 89.4 kg (197 lb)  11. BLE ulcers: Due to trauma--on doxycycline.    ABI's WNL. 12.  RA: Recent diagnosis with addition of methotrexate last month.    Avoid NSAIDs with AKI and recent MI.  Hold  Methotrexate.  13. T1DM with neuropathy: Used 70/30 insulin at home with Novolog SSI. Transitioned back to home regimen and titrate as indicated.    Remains labile, but overall improving   Cont to monitor 14. Abnormal LFTs/Recent electrolyte abnormalities: Due to GI issues and sepsis.    Remain elevated   Labs ordered for Monday 15. Hyponatremia: Progressive drop noted. Started fluid restriction and monitor.    Na 129 on 1/10   Labs ordered for Monday 16. Sore throat/Cough: On Mucinex. Added cepacol lozenges also.  17. ?GERD   Added pepcid. 18. Leukocytosis   WBCs 17.1 on 1/10   Cont to monitor 19. ABLA   Hb 10.5 on 1/10   Cont to monitor  LOS (Days) 2 A FACE TO FACE EVALUATION WAS PERFORMED  Cachet Mccutchen Karis Juba 06/14/2017 9:46 AM

## 2017-06-15 ENCOUNTER — Inpatient Hospital Stay (HOSPITAL_COMMUNITY): Payer: Medicaid Other | Admitting: Occupational Therapy

## 2017-06-15 ENCOUNTER — Inpatient Hospital Stay (HOSPITAL_COMMUNITY): Payer: Self-pay

## 2017-06-15 ENCOUNTER — Inpatient Hospital Stay (HOSPITAL_COMMUNITY): Payer: Self-pay | Admitting: Occupational Therapy

## 2017-06-15 DIAGNOSIS — J189 Pneumonia, unspecified organism: Secondary | ICD-10-CM

## 2017-06-15 LAB — GLUCOSE, CAPILLARY
GLUCOSE-CAPILLARY: 110 mg/dL — AB (ref 65–99)
GLUCOSE-CAPILLARY: 394 mg/dL — AB (ref 65–99)
Glucose-Capillary: 447 mg/dL — ABNORMAL HIGH (ref 65–99)
Glucose-Capillary: 303 mg/dL — ABNORMAL HIGH (ref 65–99)
Glucose-Capillary: 283 mg/dL — ABNORMAL HIGH (ref 65–99)
Glucose-Capillary: 146 mg/dL — ABNORMAL HIGH (ref 65–99)

## 2017-06-15 LAB — GLUCOSE, RANDOM: Glucose, Bld: 438 mg/dL — ABNORMAL HIGH (ref 65–99)

## 2017-06-15 MED ORDER — INSULIN ASPART 100 UNIT/ML ~~LOC~~ SOLN
7.0000 [IU] | Freq: Once | SUBCUTANEOUS | Status: AC
  Administered 2017-06-15: 7 [IU] via SUBCUTANEOUS

## 2017-06-15 MED ORDER — GABAPENTIN 300 MG PO CAPS
300.0000 mg | ORAL_CAPSULE | Freq: Three times a day (TID) | ORAL | Status: DC
  Administered 2017-06-15 – 2017-06-24 (×27): 300 mg via ORAL
  Filled 2017-06-15 (×27): qty 1

## 2017-06-15 NOTE — Progress Notes (Signed)
[place] PHYSICAL MEDICINE & REHABILITATION     PROGRESS NOTE  Subjective/Complaints:  Patient seen lying in bed this morning. He states he slept well overnight. He notes hypoglycemia yesterday. He also notes persistent neuropathic pain.  ROS: + Neuropathic pain. Denies CP, SOB, N/V/D.  Objective: Vital Signs: Blood pressure 117/83, pulse 93, temperature 98.1 F (36.7 C), temperature source Oral, resp. rate 18, height 6\' 2"  (1.88 m), weight 89 kg (196 lb 1.6 oz), SpO2 100 %. No results found. Recent Labs    06/13/17 0750  WBC 17.1*  HGB 10.5*  HCT 31.2*  PLT 390   Recent Labs    06/13/17 0750  NA 129*  K 4.3  CL 98*  GLUCOSE 273*  BUN 13  CREATININE 0.83  CALCIUM 8.3*   CBG (last 3)  Recent Labs    06/14/17 1722 06/14/17 2104 06/15/17 0649  GLUCAP 110* 391* 110*    Wt Readings from Last 3 Encounters:  06/15/17 89 kg (196 lb 1.6 oz)  06/11/17 92.6 kg (204 lb 2.3 oz)  05/16/17 93.7 kg (206 lb 9.6 oz)    Physical Exam:  BP 117/83 (BP Location: Right Arm)   Pulse 93   Temp 98.1 F (36.7 C) (Oral)   Resp 18   Ht 6\' 2"  (1.88 m)   Wt 89 kg (196 lb 1.6 oz)   SpO2 100%   BMI 25.18 kg/m  Constitutional: He appears well-developed and well-nourished. No distress.  HENT: Normocephalic and atraumatic.  Eyes: EOM are normal. No discharge.  Cardiovascular: RRR. No JVD. Respiratory: Effort normal and breath sounds normal.  GI: Bowel sounds are normal. He exhibits no distension. Musculoskeletal: He exhibits edema.  Neurological: He is alert and oriented.  Mild dysarthria.  Able to follow simple one and two step commands without difficulty. Motor: 4+/5 throughout  (unchanged) Skin: Skin is warm and dry. He is not diaphoretic.  Psychiatric: Flat.  Assessment/Plan: 1. Functional deficits secondary to debility which require 3+ hours per day of interdisciplinary therapy in a comprehensive inpatient rehab setting. Physiatrist is providing close team supervision and  24 hour management of active medical problems listed below. Physiatrist and rehab team continue to assess barriers to discharge/monitor patient progress toward functional and medical goals.  Function:  Bathing Bathing position   Position: Shower  Bathing parts Body parts bathed by patient: Right arm, Left arm, Chest, Abdomen, Front perineal area, Buttocks, Right upper leg, Left upper leg, Right lower leg, Left lower leg Body parts bathed by helper: Back  Bathing assist Assist Level: Set up      Upper Body Dressing/Undressing Upper body dressing   What is the patient wearing?: Pull over shirt/dress     Pull over shirt/dress - Perfomed by patient: Thread/unthread right sleeve, Thread/unthread left sleeve, Put head through opening, Pull shirt over trunk          Upper body assist Assist Level: Set up      Lower Body Dressing/Undressing Lower body dressing   What is the patient wearing?: Non-skid slipper socks, Pants     Pants- Performed by patient: Thread/unthread right pants leg, Thread/unthread left pants leg, Pull pants up/down   Non-skid slipper socks- Performed by patient: Don/doff right sock, Don/doff left sock                    Lower body assist Assist for lower body dressing: Supervision or verbal cues      Toileting Toileting   Toileting steps completed by  patient: Adjust clothing prior to toileting, Performs perineal hygiene, Adjust clothing after toileting   Toileting Assistive Devices: Grab bar or rail  Toileting assist Assist level: Touching or steadying assistance (Pt.75%)   Transfers Chair/bed transfer Chair/bed transfer activity did not occur: N/A Chair/bed transfer method: Stand pivot Chair/bed transfer assist level: Moderate assist (Pt 50 - 74%/lift or lower) Chair/bed transfer assistive device: Armrests, Patent attorney     Max distance: 178ft  Assist level: Moderate assist (Pt 50 - 74%)   Wheelchair     Max  wheelchair distance: 13ft  Assist Level: Supervision or verbal cues  Cognition Comprehension Comprehension assist level: Understands complex 90% of the time/cues 10% of the time  Expression Expression assist level: Expresses complex ideas: With extra time/assistive device  Social Interaction Social Interaction assist level: Interacts appropriately with others with medication or extra time (anti-anxiety, antidepressant).  Problem Solving Problem solving assist level: Solves complex problems: With extra time  Memory Memory assist level: More than reasonable amount of time    Medical Problem List and Plan: 1.  Deficits with mobility and ability to complete ADLs secondary to debility.   Cont CIR 2.  DVT Prophylaxis/Anticoagulation: Pharmaceutical: Lovenox 3. Pain Management:continue Ultram prn.     Gabapentin for pain bilateral hands and BLE, increased to 300 3 times a day on 1/12. 4. Mood: LCSW to follow for evaluation and support.  5. Neuropsych: This patient is capable of making decisions on his own behalf. 6. Skin/Wound Care: Maintain adequate nutritional  and hydration status.  7. Fluids/Electrolytes/Nutrition: Monitor I/O--strict.   8. Sepsis due to aspiration PNA/strep viridans bacteremia: Ceftriaxone and Doxy  9. Leucocytosis: Continue to monitor for signs of infection.    PNA resolving, CXR 1/10 reviewed, showing improving PNA   Labs ordered for Monday 10. CAD with acute systolic CHF: Monitor weights daily. Lasix on hold due to AKI.     Filed Weights   06/13/17 0303 06/14/17 0423 06/15/17 0500  Weight: 89.7 kg (197 lb 12.4 oz) 89.4 kg (197 lb) 89 kg (196 lb 1.6 oz)  11. BLE ulcers: Due to trauma--on doxycycline.    ABI's WNL. 12.  RA: Recent diagnosis with addition of methotrexate last month.    Avoid NSAIDs with AKI and recent MI.  Hold  Methotrexate.  13. T1DM with neuropathy: Used 70/30 insulin at home with Novolog SSI.    Home regimen decreased to NovoLog 70/3025 units  with breakfast and 30 units with supper   Symptomatic hypoglycemia on 1/11 14. Abnormal LFTs/Recent electrolyte abnormalities: Due to GI issues and sepsis.    Remain elevated   Labs ordered for Monday 15. Hyponatremia: Progressive drop noted. Started fluid restriction and monitor.    Na 129 on 1/10   Labs ordered for Monday 16. Sore throat/Cough: On Mucinex. Added cepacol lozenges also.  17. ?GERD   Added pepcid. 18. Leukocytosis   WBCs 17.1 on 1/10   Cont to monitor 19. ABLA   Hb 10.5 on 1/10   Cont to monitor  LOS (Days) 3 A FACE TO FACE EVALUATION WAS PERFORMED  [place] Karis Juba 06/15/2017 8:27 AM

## 2017-06-15 NOTE — Progress Notes (Signed)
Occupational Therapy Session Note  Patient Details  Name: Matthew Riley MRN: [id number] Date of Birth: [birth year]  Today's Date: 06/15/2017 OT Individual Time: 7425-9563 and 1245-1315 OT Individual Time Calculation (min): 60 min and 30 minutes =total 90 minutes   Short Term Goals: Week 1:  OT Short Term Goal 1 (Week 1): Pt will transfer to/from toilet with supervision. OT Short Term Goal 2 (Week 1): Pt will complete showering at sit<>stand level maintaining standing balance with Minguard assist.  OT Short Term Goal 3 (Week 1): Pt will complete 3/3 parts of LB dressing with supervision. OT Short Term Goal 4 (Week 1): Pt will engage in standing activity >73min prior to needing seated rest break.   Skilled Therapeutic Interventions/Progress Updates: j  AM session:  ADL in shower and dressing EOB and standing for oral care at sink.  Patient was able to stand in hower for bathing 5+ minutes with dynamic balance to bathe periarea and upper body.   He sat for washing feet.  He wazsable to stand at sink approx 5 minutes to complete oral care and some of combing his hair.  Focus also on bilateral and left shoulder.   Comprehension and cognitive processing required extra time this session.    Patient wanted to ly back down to rest after session.   Call bell within reach and alarm set  PM session: patient participated in dynamic balance and right upper extremityo motor control - focusoning on right shoulder.   Patient tended to stand for an anterior lean; however, he stated he felt as if he was standing upright and straight.    At end of session patient's sister and neceices arrived with clothing.    Patient was left on his bed speaking with his sister and with call bell and phone nearby.  Therapy Documentation Precautions:  Precautions Precautions: Fall Restrictions Weight Bearing Restrictions: No Pain:denied   See Function Navigator for Current Functional Status.   Therapy/Group: Individual  Therapy  Bud Face [facility] 06/15/2017, 9:04 AM

## 2017-06-15 NOTE — Progress Notes (Signed)
Physical Therapy Session Note  Patient Details  Name: Matthew Riley MRN: [id number] Date of Birth: 08/19/1975  Today's Date: 06/15/2017 PT Individual Time: 1002-1045, 1401-1500 PT Individual Time Calculation (min): 43 min and 59 min   Short Term Goals: Week 1:  PT Short Term Goal 1 (Week 1): Pt will transfer to [facility] with supervision assist  PT Short Term Goal 2 (Week 1): Pt will ambulate 163ft with Supervision assist PT Short Term Goal 3 (Week 1): Pt will ascend 12 steps with min assist   Skilled Therapeutic Interventions/Progress Updates:   Session 1:  Pt seated EOB upon PT arrival, agreeable to therapy tx and denies pain, reports feeling tight. Pt ambulated from room<>gym x 150 ft each direction with min assist using RW, mild ataxia. Pt performed LE stretches secondary to tightness, each 2 x 30 sec: figure 4 stretch, hamstring stretch, calf stretch standing on wedge, hip flexor stretch, and piriformis stretch. Pt worked on coordination and balance without UE support in order to perform toe taps on colored dots. Pt worked on standing balance on foam: eyes open, eyes closed, feet apart and feet together. Pt ambulated back to room and left seated EOB with needs in reach.   Session 2: Pt seated EOB upon PT arrival, agreeable to therapy tx and denies pain. Pt ambulated from room<>gym x 150 ft with RW and min assist, verbal cues for decreased UE reliance. Pt worked on dynamic standing balance this session without UE support requiring min-mod assist: forward/backwards ambulation, sidestepping, ambulated x 80 ft, toe taps on 4 inch step x 2 trials. Pt performed 2 x 10 sit<>stands without UE support for LE strengthening and NMR. Pt ambulated x 150 ft back to room with min assist without AD, increased ataxia noted without use of AD, verbal cues to limit narrow BOS during gait to prevent LOB. Pt left seated EOB with needs in reach.    Therapy Documentation Precautions:  Precautions Precautions:  Fall Restrictions Weight Bearing Restrictions: No   See Function Navigator for Current Functional Status.   Therapy/Group: Individual Therapy  Cresenciano Genre, PT, DPT 06/15/2017, 7:52 AM

## 2017-06-15 NOTE — Plan of Care (Signed)
  Progressing RH BOWEL ELIMINATION RH STG MANAGE BOWEL WITH ASSISTANCE Description STG Manage Bowel with mod I Assistance.  06/15/2017 2311 - Progressing by Sissy Hoff, RN RH SAFETY RH STG ADHERE TO SAFETY PRECAUTIONS W/ASSISTANCE/DEVICE Description STG Adhere to Safety Precautions With mod I Assistance/Device.  06/15/2017 2311 - Progressing by Sissy Hoff, RN RH PAIN MANAGEMENT RH STG PAIN MANAGED AT OR BELOW PT'S PAIN GOAL Description < 4  06/15/2017 2311 - Progressing by Sissy Hoff, RN

## 2017-06-16 ENCOUNTER — Inpatient Hospital Stay (HOSPITAL_COMMUNITY): Payer: Self-pay

## 2017-06-16 LAB — GLUCOSE, CAPILLARY
GLUCOSE-CAPILLARY: 155 mg/dL — AB (ref 65–99)
GLUCOSE-CAPILLARY: 234 mg/dL — AB (ref 65–99)
Glucose-Capillary: 94 mg/dL (ref 65–99)
Glucose-Capillary: 128 mg/dL — ABNORMAL HIGH (ref 65–99)

## 2017-06-16 NOTE — Progress Notes (Signed)
Occupational Therapy Session Note  Patient Details  Name: Matthew Riley MRN: [id number] Date of Birth: 27-Oct-1975  Today's Date: 06/16/2017 OT Individual Time: 7412-8786 OT Individual Time Calculation (min): 42 min    Short Term Goals: Week 1:  OT Short Term Goal 1 (Week 1): Pt will transfer to/from toilet with supervision. OT Short Term Goal 2 (Week 1): Pt will complete showering at sit<>stand level maintaining standing balance with Minguard assist.  OT Short Term Goal 3 (Week 1): Pt will complete 3/3 parts of LB dressing with supervision. OT Short Term Goal 4 (Week 1): Pt will engage in standing activity >28min prior to needing seated rest break.   Skilled Therapeutic Interventions/Progress Updates:    1:1. Pt ambulates with RW to/from all tx spaces. OT switches out for taller walker to improve better posture and ability to look forward when walking. Pt trnasfers onto mat with VC for RW management. Pt plays 3 rounds of mini golf putting 5 ball staning on standard tile, foam, and wedge to challenge balance with CGA-MIN A for balance in prep for ADLs. Pt completes 1x15 UB therex for BUE in all planes of motion to improve strength and endurance with 7# dumbells. Exited session with pt seated in bed and call light inreach  Therapy Documentation Precautions:  Precautions Precautions: Fall Restrictions Weight Bearing Restrictions: No  See Function Navigator for Current Functional Status.   Therapy/Group: Individual Therapy  Shon Hale 06/16/2017, 3:28 PM

## 2017-06-16 NOTE — Progress Notes (Signed)
[place] PHYSICAL MEDICINE & REHABILITATION     PROGRESS NOTE  Subjective/Complaints:  Patient seen lying in bed this morning. He states he slept well overnight. He notes issues with CBGs yesterday. He continues to complain about his diet.  ROS:  Denies CP, SOB, N/V/D.  Objective: Vital Signs: Blood pressure 120/83, pulse 94, temperature 98 F (36.7 C), temperature source Oral, resp. rate 18, height 6\' 2"  (1.88 m), weight 91 kg (200 lb 9.6 oz), SpO2 100 %. No results found. Recent Labs    06/13/17 0750  WBC 17.1*  HGB 10.5*  HCT 31.2*  PLT 390   Recent Labs    06/13/17 0750 06/15/17 1141  NA 129*  --   K 4.3  --   CL 98*  --   GLUCOSE 273* 438*  BUN 13  --   CREATININE 0.83  --   CALCIUM 8.3*  --    CBG (last 3)  Recent Labs    06/15/17 1634 06/15/17 2048 06/16/17 0643  GLUCAP 283* 146* 94    Wt Readings from Last 3 Encounters:  06/16/17 91 kg (200 lb 9.6 oz)  06/11/17 92.6 kg (204 lb 2.3 oz)  05/16/17 93.7 kg (206 lb 9.6 oz)    Physical Exam:  BP 120/83 (BP Location: Right Arm)   Pulse 94   Temp 98 F (36.7 C) (Oral)   Resp 18   Ht 6\' 2"  (1.88 m)   Wt 91 kg (200 lb 9.6 oz)   SpO2 100%   BMI 25.76 kg/m  Constitutional: He appears well-developed and well-nourished. No distress.  HENT: Normocephalic and atraumatic.  Eyes: EOM are normal. No discharge.  Cardiovascular: RRR. No JVD. Respiratory: Effort normal and breath sounds normal.  GI: Bowel sounds are normal. He exhibits no distension. Musculoskeletal: He exhibits no edema, no tenderness.  Neurological: He is alert and oriented.  Mild dysarthria.  Able to follow simple one and two step commands without difficulty. Motor: 4+/5 throughout  (stable) Skin: Skin is warm and dry. He is not diaphoretic.  Psychiatric: Flat.  Assessment/Plan: 1. Functional deficits secondary to debility which require 3+ hours per day of interdisciplinary therapy in a comprehensive inpatient rehab  setting. Physiatrist is providing close team supervision and 24 hour management of active medical problems listed below. Physiatrist and rehab team continue to assess barriers to discharge/monitor patient progress toward functional and medical goals.  Function:  Bathing Bathing position   Position: Shower  Bathing parts Body parts bathed by patient: Right arm, Left arm, Chest, Abdomen, Front perineal area, Buttocks, Right upper leg, Left upper leg, Right lower leg, Left lower leg Body parts bathed by helper: Back  Bathing assist Assist Level: Set up      Upper Body Dressing/Undressing Upper body dressing   What is the patient wearing?: Pull over shirt/dress     Pull over shirt/dress - Perfomed by patient: Thread/unthread right sleeve, Thread/unthread left sleeve, Put head through opening, Pull shirt over trunk          Upper body assist Assist Level: Set up      Lower Body Dressing/Undressing Lower body dressing   What is the patient wearing?: Non-skid slipper socks, Pants     Pants- Performed by patient: Thread/unthread right pants leg, Thread/unthread left pants leg, Pull pants up/down   Non-skid slipper socks- Performed by patient: Don/doff right sock, Don/doff left sock  Lower body assist Assist for lower body dressing: Supervision or verbal cues      Toileting Toileting   Toileting steps completed by patient: Adjust clothing prior to toileting, Performs perineal hygiene, Adjust clothing after toileting   Toileting Assistive Devices: Grab bar or rail  Toileting assist Assist level: Supervision or verbal cues   Transfers Chair/bed transfer Chair/bed transfer activity did not occur: N/A Chair/bed transfer method: Stand pivot Chair/bed transfer assist level: Touching or steadying assistance (Pt > 75%) Chair/bed transfer assistive device: Armrests, Patent attorney     Max distance: 13ft  Assist level: Touching or  steadying assistance (Pt > 75%)   Wheelchair     Max wheelchair distance: 160ft  Assist Level: Supervision or verbal cues  Cognition Comprehension Comprehension assist level: Follows complex conversation/direction with extra time/assistive device  Expression Expression assist level: Expresses complex ideas: With extra time/assistive device  Social Interaction Social Interaction assist level: Interacts appropriately with others with medication or extra time (anti-anxiety, antidepressant).  Problem Solving Problem solving assist level: Solves complex problems: With extra time  Memory Memory assist level: More than reasonable amount of time    Medical Problem List and Plan: 1.  Deficits with mobility and ability to complete ADLs secondary to debility.   Cont CIR 2.  DVT Prophylaxis/Anticoagulation: Pharmaceutical: Lovenox 3. Pain Management:continue Ultram prn.     Gabapentin for pain bilateral hands and BLE, increased to 300 3 times a day on 1/12.   Improving  4. Mood: LCSW to follow for evaluation and support.  5. Neuropsych: This patient is capable of making decisions on his own behalf. 6. Skin/Wound Care: Maintain adequate nutritional  and hydration status.  7. Fluids/Electrolytes/Nutrition: Monitor I/O--strict.   8. Sepsis due to aspiration PNA/strep viridans bacteremia: Ceftriaxone and Doxy  9. Leucocytosis: Continue to monitor for signs of infection.    PNA resolving, CXR 1/10 reviewed, showing improving PNA   Labs ordered for tomorrow 10. CAD with acute systolic CHF: Monitor weights daily. Lasix on hold due to AKI.     Filed Weights   06/14/17 0423 06/15/17 0500 06/16/17 0435  Weight: 89.4 kg (197 lb) 89 kg (196 lb 1.6 oz) 91 kg (200 lb 9.6 oz)  11. BLE ulcers: Due to trauma--on doxycycline.    ABI's WNL. 12.  RA: Recent diagnosis with addition of methotrexate last month.    Avoid NSAIDs with AKI and recent MI.  Hold  Methotrexate.  13. T1DM with neuropathy: Used 70/30  insulin at home with Novolog SSI.    Home regimen decreased to NovoLog 70/3025 units with breakfast and 30 units with supper   Remains extremely labile with CBGs >400 yesterday. Will not make any changes today and monitor for trend  14. Abnormal LFTs/Recent electrolyte abnormalities: Due to GI issues and sepsis.    Remain elevated   Labs ordered for tomorrow 15. Hyponatremia: Progressive drop noted. Started fluid restriction and monitor.    Na 129 on 1/10   Labs ordered for tomorrow 16. Sore throat/Cough: On Mucinex. Added cepacol lozenges also.  17. ?GERD   Added pepcid. 18. Leukocytosis   WBCs 17.1 on 1/10   Labs ordered for tomorrow   Cont to monitor 19. ABLA   Hb 10.5 on 1/10   Labs ordered for tomorrow   Cont to monitor  LOS (Days) 4 A FACE TO FACE EVALUATION WAS PERFORMED  Matthew Riley 06/16/2017 7:17 AM

## 2017-06-16 NOTE — Progress Notes (Signed)
Physical Therapy Session Note  Patient Details  Name: Matthew Riley MRN: [id number] Date of Birth: [birth year]  Today's Date: 06/16/2017 PT Individual Time: 1001-1058 PT Individual Time Calculation (min): 57 min   Short Term Goals: Week 1:  PT Short Term Goal 1 (Week 1): Pt will transfer to [facility] with supervision assist  PT Short Term Goal 2 (Week 1): Pt will ambulate 171ft with Supervision assist PT Short Term Goal 3 (Week 1): Pt will ascend 12 steps with min assist   Skilled Therapeutic Interventions/Progress Updates:     Pt supine in bed upon PT arrival, agreeable to therapy tx and denies pain. Pt transferred supine>sitting EOB with supervision. Pt ambulated from room>gym using RW and with supervision x 150 ft. Session focused on dynamic balance without AD and gait without AD. Pt worked on navigation around cones, stepping over objects, sidestepping, and toe taps to work on balance. Pt completed berg balance test and dynamic gait index as detailed below, discussed results with the pt. Pt ambulated from gym>BI gym without AD x 150 ft with min assist, increased ataxia. Pt worked on balance using biodex to perform limits of stability training. Pt ambulated back to room without AD and min assist, left seated EOB with needs in reach.   Therapy Documentation Precautions:  Precautions Precautions: Fall Restrictions Weight Bearing Restrictions: No     Balance: Standardized Balance Assessment Standardized Balance Assessment: Berg Balance Test;Dynamic Gait Index Berg Balance Test Sit to Stand: Able to stand without using hands and stabilize independently Standing Unsupported: Able to stand 2 minutes with supervision Sitting with Back Unsupported but Feet Supported on Floor or Stool: Able to sit safely and securely 2 minutes Stand to Sit: Controls descent by using hands Transfers: Able to transfer safely, definite need of hands Standing Unsupported with Eyes Closed: Able to stand 10 seconds with  supervision Standing Ubsupported with Feet Together: Able to place feet together independently and stand for 1 minute with supervision From Standing, Reach Forward with Outstretched Arm: Can reach confidently >25 cm (10") From Standing Position, Pick up Object from Floor: Able to pick up shoe, needs supervision From Standing Position, Turn to Look Behind Over each Shoulder: Looks behind one side only/other side shows less weight shift Turn 360 Degrees: Able to turn 360 degrees safely but slowly Standing Unsupported, Alternately Place Feet on Step/Stool: Able to stand independently and complete 8 steps >20 seconds Standing Unsupported, One Foot in Front: Able to take small step independently and hold 30 seconds Standing on One Leg: Tries to lift leg/unable to hold 3 seconds but remains standing independently Total Score: 41 Dynamic Gait Index Level Surface: Moderate Impairment Change in Gait Speed: Moderate Impairment Gait with Horizontal Head Turns: Moderate Impairment Gait with Vertical Head Turns: Moderate Impairment Gait and Pivot Turn: Mild Impairment Step Over Obstacle: Moderate Impairment Step Around Obstacles: Mild Impairment Steps: Moderate Impairment Total Score: 10 Exercises:   Other Treatments:     See Function Navigator for Current Functional Status.   Therapy/Group: Individual Therapy  Cresenciano Genre, PT, DPT 06/16/2017, 10:45 AM

## 2017-06-17 ENCOUNTER — Encounter (HOSPITAL_COMMUNITY): Payer: Self-pay | Admitting: Psychology

## 2017-06-17 ENCOUNTER — Inpatient Hospital Stay (HOSPITAL_COMMUNITY): Payer: Self-pay | Admitting: Occupational Therapy

## 2017-06-17 ENCOUNTER — Inpatient Hospital Stay (HOSPITAL_COMMUNITY): Payer: Self-pay | Admitting: Physical Therapy

## 2017-06-17 ENCOUNTER — Inpatient Hospital Stay (HOSPITAL_COMMUNITY): Payer: Medicaid Other | Admitting: Occupational Therapy

## 2017-06-17 DIAGNOSIS — E1049 Type 1 diabetes mellitus with other diabetic neurological complication: Secondary | ICD-10-CM

## 2017-06-17 DIAGNOSIS — R7881 Bacteremia: Secondary | ICD-10-CM

## 2017-06-17 DIAGNOSIS — E1142 Type 2 diabetes mellitus with diabetic polyneuropathy: Secondary | ICD-10-CM

## 2017-06-17 LAB — CBC WITH DIFFERENTIAL/PLATELET
BASOS ABS: 0.1 10*3/uL (ref 0.0–0.1)
BASOS PCT: 1 %
EOS PCT: 4 %
Eosinophils Absolute: 0.4 10*3/uL (ref 0.0–0.7)
HEMATOCRIT: 31.6 % — AB (ref 39.0–52.0)
HEMOGLOBIN: 10.7 g/dL — AB (ref 13.0–17.0)
LYMPHS ABS: 3.6 10*3/uL (ref 0.7–4.0)
Lymphocytes Relative: 36 %
MCH: 29.7 pg (ref 26.0–34.0)
MCHC: 33.9 g/dL (ref 30.0–36.0)
MCV: 87.8 fL (ref 78.0–100.0)
MONOS PCT: 8 %
Monocytes Absolute: 0.8 10*3/uL (ref 0.1–1.0)
NEUTROS ABS: 5 10*3/uL (ref 1.7–7.7)
Neutrophils Relative %: 51 %
Platelets: 451 10*3/uL — ABNORMAL HIGH (ref 150–400)
RBC: 3.6 MIL/uL — ABNORMAL LOW (ref 4.22–5.81)
RDW: 14.6 % (ref 11.5–15.5)
WBC: 9.9 10*3/uL (ref 4.0–10.5)

## 2017-06-17 LAB — COMPREHENSIVE METABOLIC PANEL
ALK PHOS: 116 U/L (ref 38–126)
ALT: 107 U/L — ABNORMAL HIGH (ref 17–63)
ANION GAP: 5 (ref 5–15)
AST: 154 U/L — ABNORMAL HIGH (ref 15–41)
Albumin: 2.3 g/dL — ABNORMAL LOW (ref 3.5–5.0)
BILIRUBIN TOTAL: 0.8 mg/dL (ref 0.3–1.2)
BUN: 28 mg/dL — ABNORMAL HIGH (ref 6–20)
CALCIUM: 8.7 mg/dL — AB (ref 8.9–10.3)
CO2: 24 mmol/L (ref 22–32)
Chloride: 100 mmol/L — ABNORMAL LOW (ref 101–111)
Creatinine, Ser: 0.97 mg/dL (ref 0.61–1.24)
GFR calc Af Amer: >60 mL/min (ref >60)
Glucose, Bld: 269 mg/dL — ABNORMAL HIGH (ref 65–99)
POTASSIUM: 4.3 mmol/L (ref 3.5–5.1)
Sodium: 129 mmol/L — ABNORMAL LOW (ref 135–145)
TOTAL PROTEIN: 8.5 g/dL — AB (ref 6.5–8.1)

## 2017-06-17 LAB — GLUCOSE, CAPILLARY
GLUCOSE-CAPILLARY: 310 mg/dL — AB (ref 65–99)
GLUCOSE-CAPILLARY: 114 mg/dL — AB (ref 65–99)
GLUCOSE-CAPILLARY: 336 mg/dL — AB (ref 65–99)
GLUCOSE-CAPILLARY: 43 mg/dL — AB (ref 65–99)
GLUCOSE-CAPILLARY: 71 mg/dL (ref 65–99)
Glucose-Capillary: 46 mg/dL — ABNORMAL LOW (ref 65–99)
Glucose-Capillary: 52 mg/dL — ABNORMAL LOW (ref 65–99)
Glucose-Capillary: 65 mg/dL (ref 65–99)
Glucose-Capillary: 105 mg/dL — ABNORMAL HIGH (ref 65–99)

## 2017-06-17 MED ORDER — INSULIN ASPART PROT & ASPART (70-30 MIX) 100 UNIT/ML ~~LOC~~ SUSP
35.0000 [IU] | Freq: Every day | SUBCUTANEOUS | Status: DC
  Administered 2017-06-17 – 2017-06-19 (×3): 35 [IU] via SUBCUTANEOUS

## 2017-06-17 MED ORDER — INSULIN ASPART 100 UNIT/ML ~~LOC~~ SOLN
0.0000 [IU] | Freq: Three times a day (TID) | SUBCUTANEOUS | Status: DC
  Administered 2017-06-17: 7 [IU] via SUBCUTANEOUS
  Administered 2017-06-18 – 2017-06-19 (×3): 5 [IU] via SUBCUTANEOUS
  Administered 2017-06-19: 2 [IU] via SUBCUTANEOUS
  Administered 2017-06-20: 7 [IU] via SUBCUTANEOUS
  Administered 2017-06-20 – 2017-06-21 (×2): 5 [IU] via SUBCUTANEOUS
  Administered 2017-06-21: 3 [IU] via SUBCUTANEOUS
  Administered 2017-06-21 – 2017-06-22 (×2): 2 [IU] via SUBCUTANEOUS
  Administered 2017-06-22 (×2): 3 [IU] via SUBCUTANEOUS
  Administered 2017-06-23: 9 [IU] via SUBCUTANEOUS
  Administered 2017-06-23: 3 [IU] via SUBCUTANEOUS
  Administered 2017-06-23: 9 [IU] via SUBCUTANEOUS
  Administered 2017-06-24: 7 [IU] via SUBCUTANEOUS
  Administered 2017-06-24: 2 [IU] via SUBCUTANEOUS

## 2017-06-17 MED ORDER — INSULIN ASPART 100 UNIT/ML ~~LOC~~ SOLN
0.0000 [IU] | Freq: Every day | SUBCUTANEOUS | Status: DC
  Administered 2017-06-22: 2 [IU] via SUBCUTANEOUS

## 2017-06-17 MED ORDER — PREMIER PROTEIN SHAKE
11.0000 [oz_av] | Freq: Three times a day (TID) | ORAL | Status: DC
  Administered 2017-06-18 – 2017-06-24 (×22): 11 [oz_av] via ORAL
  Filled 2017-06-17 (×48): qty 325.31

## 2017-06-17 MED ORDER — INSULIN ASPART PROT & ASPART (70-30 MIX) 100 UNIT/ML ~~LOC~~ SUSP
20.0000 [IU] | Freq: Every day | SUBCUTANEOUS | Status: DC
  Administered 2017-06-18 – 2017-06-20 (×3): 20 [IU] via SUBCUTANEOUS
  Filled 2017-06-17: qty 10

## 2017-06-17 MED ORDER — GLUCOSE 40 % PO GEL
ORAL | Status: AC
  Filled 2017-06-17: qty 1

## 2017-06-17 NOTE — Progress Notes (Signed)
Physical Therapy Note  Patient Details  Name: Matthew Riley MRN: [id number] Date of Birth: 10-Feb-1976 Today's Date: 06/17/2017    Time: 1040-1144 64 minutes  1:1 No c/o pain.  Gait with RW throughout unit with supervision with cues for less UE use on RW. Gait with obstacle negotiation with RW with supervision, improvement vs last session. Gait without AD multiple bouts of 100' with focus on upright posture and increasing cadence to improve arm swing and fluidity and efficiency of gait. Pt tends to walk with hips and knees in extension with wide BOS during gait without AD.  standing balance with lateral step ups to 4'' step with mod A.  Stepping over objects laterally with focus on hip control with mod A.  Sit to stand repetitions with ball squeeze 2 x 10.  Bridge and hooklying knee extension both with ball squeeze with pt fatiguing quickly with this activity.  In room pt able to stand and place pillows in pillow cases with min guard.  Pt left in bed with needs at hand.   [name] 06/17/2017, 11:44 AM

## 2017-06-17 NOTE — Consult Note (Signed)
Neuropsychological Consultation   Patient:   Matthew Riley   DOB:   Aug 18, 1975  MR Number:  [id number]  Location:  [facility] [place] [facility] [address] [id number] [place] [address] Dept: [phone] Loc: [phone]           Date of Service:   06/17/2017  Start Time:   8 AM End Time:   9 AM  Provider/Observer:  Arley Phenix, Psy.D.       Clinical Neuropsychologist       Billing Code/Service: [phone] 4 Units  Chief Complaint:    Matthew Riley is a 42 year old male with a history of hypertension, chronic kidney disease, chronic low blood pressure and type 1 diabetes.  The patient had a CVA on 02/2017 and subsequently moved to [place] to be around family.  The patient was admitted on 06/03/2017 after he was found unresponsive at his home with evidence of aspiration.  CT was negative for acute process.  Due to ongoing debility and physical difficulties including weakness in his legs and balance issues the patient was recommended for comprehensive inpatient rehabilitative services.  Reason for Service:  The patient was referred for neuropsychological/psychological evaluation due to concerns about possible residual effects of his anoxic event and adjustment/coping responses to extended hospital stay.  Below is the HPI for the current admission.  HPI: Matthew Riley a 42 y.o.malewith history of HTN, CKD stage III, chronic LBP, T1DM-- uncontrolled,CVA 02/2017 (moved to [place] to be with family),CAD s/p stenting with DKA 04/2017 who was admitted on 06/03/17 after found unresponsive at home with evidence of aspiration. UDS positive for THC and benzos. History taken from chart review. Patient with reports of N/V with diarrhea and was intubated due to decreased LOC and treated for sepsis with pressors and broad spectrum antibiotics. CT chest/abdomen showed diffuse bilateral PNA. One blood culture positive for strep  viridans. CT abdomen repeated showing anasarca but no abscess or abnormality. CT head reviewed, negative for acute process. He self extubated on tolerated extubation on 11/5 and respiratory status stable. Hypotension treated with fluid boluses. Electrolyte abnormality resolving and antibiotics narrowed to ceftriaxone on 01/8. BLE wounds treated with local measures as well as doxycyline--ABI's ordered for work up.  Therapy ongoing and CIR recommended due to functional deficits.   Current Status:  The patient's current mental status has been improving and now he is well oriented x4.  While he has anterior grade memory deficits immediately after the event his memory has improved and he appears to be returning to baseline. While the patient has had some coping issues with being in hospital, he is coping better now and reports that his mood is good and that he is not experiencing significant depressive or anxiety symptoms.  Memory returning to baseline, with good judgment and executive functioning good.   Behavioral Observation: Matthew Riley  presents as a 42 y.o.-year-old Right African American Male who appeared his stated age. his dress was Appropriate and he was Well Groomed and his manners were Appropriate to the situation.  his participation was indicative of Appropriate and Attentive behaviors.  There were physical disabilities noted related to balance/ataxia and leg weakness.  he displayed an appropriate level of cooperation and motivation.     Interactions:    Active Appropriate and Attentive  Attention:   within normal limits and attention span and concentration were age appropriate  Memory:   within normal limits; recent and remote memory intact  Visuo-spatial:  not examined  Speech (Volume):  normal  Speech:   normal;   Thought Process:  Coherent and Relevant  Though Content:  WNL; not suicidal  Orientation:   person, place, time/date and  situation  Judgment:   Good  Planning:   Good  Affect:    Appropriate  Mood:    Euthymic  Insight:   Good  Intelligence:   normal  Substance Use:  There is a documented history of marijuana abuse confirmed by the patient.    Medical History:   Past Medical History:  Diagnosis Date  . Coronary artery disease   . Diabetes mellitus without complication (HCC)   . GERD (gastroesophageal reflux disease)   . Hepatitis B   . Hypertension   . Renal disorder   . Rheumatoid arthritis (HCC)   . Stroke [facility]) 02/2017           Psychiatric History:  The patient has been dealing with depressive symptoms after family member died recently.  Family Med/Psych History:  Family History  Problem Relation Age of Onset  . Diabetes Mother   . Diabetes Father     Risk of Suicide/Violence: low patient denies SI or HI.  Impression/DX:  Matthew Riley is a 42 year old male with a history of hypertension, chronic kidney disease, chronic low blood pressure and type 1 diabetes.  The patient had a CVA on 02/2017 and subsequently moved to [place] to be around family.  The patient was admitted on 06/03/2017 after he was found unresponsive at his home with evidence of aspiration.  CT was negative for acute process.  Due to ongoing debility and physical difficulties including weakness in his legs and balance issues the patient was recommended for comprehensive inpatient rehabilitative services.  The patient's current mental status has been improving and now he is well oriented x4.  While he has anterior grade memory deficits immediately after the event his memory has improved and he appears to be returning to baseline. While the patient has had some coping issues with being in hospital, he is coping better now and reports that his mood is good and that he is not experiencing significant depressive or anxiety symptoms.  Memory returning to baseline, with good judgment and executive functioning  good.  Diagnosis:    PNA (pneumonia) - Plan: DG Chest 2 View, DG Chest 2 View         Electronically Signed   _______________________ Arley Phenix, Psy.D.

## 2017-06-17 NOTE — Progress Notes (Signed)
[place] PHYSICAL MEDICINE & REHABILITATION     PROGRESS NOTE  Subjective/Complaints:  Patient seen sitting up in bed this morning eating breakfast. He states he slept well overnight. He is ready to resume therapies today.  ROS:  Denies CP, SOB, N/V/D.  Objective: Vital Signs: Blood pressure 108/72, pulse 92, temperature 98.1 F (36.7 C), temperature source Oral, resp. rate 18, height 6\' 2"  (1.88 m), weight 91.8 kg (202 lb 6.4 oz), SpO2 99 %. No results found. No results for input(s): WBC, HGB, HCT, PLT in the last 72 hours. Recent Labs    06/15/17 1141  GLUCOSE 438*   CBG (last 3)  Recent Labs    06/16/17 1629 06/16/17 2058 06/17/17 0634  GLUCAP 234* 128* 310*    Wt Readings from Last 3 Encounters:  06/17/17 91.8 kg (202 lb 6.4 oz)  06/11/17 92.6 kg (204 lb 2.3 oz)  05/16/17 93.7 kg (206 lb 9.6 oz)    Physical Exam:  BP 108/72 (BP Location: Left Arm)   Pulse 92   Temp 98.1 F (36.7 C) (Oral)   Resp 18   Ht 6\' 2"  (1.88 m)   Wt 91.8 kg (202 lb 6.4 oz)   SpO2 99%   BMI 25.99 kg/m  Constitutional: He appears well-developed and well-nourished. No distress.  HENT: Normocephalic and atraumatic.  Eyes: EOM are normal. No discharge.  Cardiovascular: RRR. No JVD. Respiratory: Effort normal and breath sounds normal.  GI: Bowel sounds are normal. He exhibits no distension. Musculoskeletal: He exhibits no edema, no tenderness.  Neurological: He is alert and oriented.  Mild dysarthria.  Able to follow simple one and two step commands without difficulty. Motor: 4+/5 throughout  (unchanged) Skin: Skin is warm and dry. He is not diaphoretic.  Psychiatric: Flat.  Assessment/Plan: 1. Functional deficits secondary to debility which require 3+ hours per day of interdisciplinary therapy in a comprehensive inpatient rehab setting. Physiatrist is providing close team supervision and 24 hour management of active medical problems listed below. Physiatrist and rehab team  continue to assess barriers to discharge/monitor patient progress toward functional and medical goals.  Function:  Bathing Bathing position   Position: Shower  Bathing parts Body parts bathed by patient: Right arm, Left arm, Chest, Abdomen, Front perineal area, Buttocks, Right upper leg, Left upper leg, Right lower leg, Left lower leg Body parts bathed by helper: Back  Bathing assist Assist Level: Set up      Upper Body Dressing/Undressing Upper body dressing   What is the patient wearing?: Pull over shirt/dress     Pull over shirt/dress - Perfomed by patient: Thread/unthread right sleeve, Thread/unthread left sleeve, Put head through opening, Pull shirt over trunk          Upper body assist Assist Level: Set up      Lower Body Dressing/Undressing Lower body dressing   What is the patient wearing?: Non-skid slipper socks, Pants     Pants- Performed by patient: Thread/unthread right pants leg, Thread/unthread left pants leg, Pull pants up/down   Non-skid slipper socks- Performed by patient: Don/doff right sock, Don/doff left sock                    Lower body assist Assist for lower body dressing: Supervision or verbal cues      Toileting Toileting   Toileting steps completed by patient: Adjust clothing prior to toileting, Performs perineal hygiene, Adjust clothing after toileting   Toileting Assistive Devices: Grab bar or rail  Toileting assist  Assist level: Supervision or verbal cues   Transfers Chair/bed transfer Chair/bed transfer activity did not occur: N/A Chair/bed transfer method: Stand pivot, Ambulatory Chair/bed transfer assist level: Touching or steadying assistance (Pt > 75%) Chair/bed transfer assistive device: Armrests, Patent attorney     Max distance: 150 ft Assist level: Touching or steadying assistance (Pt > 75%)   Wheelchair     Max wheelchair distance: 151ft  Assist Level: Supervision or verbal cues   Cognition Comprehension Comprehension assist level: Understands complex 90% of the time/cues 10% of the time  Expression Expression assist level: Expresses complex ideas: With extra time/assistive device  Social Interaction Social Interaction assist level: Interacts appropriately with others with medication or extra time (anti-anxiety, antidepressant).  Problem Solving Problem solving assist level: Solves complex problems: With extra time  Memory Memory assist level: More than reasonable amount of time    Medical Problem List and Plan: 1.  Deficits with mobility and ability to complete ADLs secondary to debility.   Cont CIR 2.  DVT Prophylaxis/Anticoagulation: Pharmaceutical: Lovenox 3. Pain Management:continue Ultram prn.     Gabapentin for pain bilateral hands and BLE, increased to 300 3 times a day on 1/12.   Improving  4. Mood: LCSW to follow for evaluation and support.  5. Neuropsych: This patient is capable of making decisions on his own behalf. 6. Skin/Wound Care: Maintain adequate nutritional  and hydration status.  7. Fluids/Electrolytes/Nutrition: Monitor I/O--strict.   8. Sepsis due to aspiration PNA/strep viridans bacteremia:    Ceftriaxone and Doxy, will inquire about duration of therapy 9. Leucocytosis: Continue to monitor for signs of infection.    PNA resolving, CXR 1/10 reviewed, showing improving PNA   Labs pending 10. CAD with acute systolic CHF: Monitor weights daily. Lasix on hold due to AKI.     Filed Weights   06/15/17 0500 06/16/17 0435 06/17/17 0334  Weight: 89 kg (196 lb 1.6 oz) 91 kg (200 lb 9.6 oz) 91.8 kg (202 lb 6.4 oz)  11. BLE ulcers: Due to trauma--on doxycycline.    ABI's WNL. 12.  RA: Recent diagnosis with addition of methotrexate last month.    Avoid NSAIDs with AKI and recent MI.  Hold  Methotrexate.  13. T1DM with neuropathy: Used 70/30 insulin at home with Novolog SSI.    Home regimen decreased to NovoLog 70/3025 units with breakfast and 30  units with supper   Remains labile with dextrose infusion 14. Abnormal LFTs/Recent electrolyte abnormalities: Due to GI issues and sepsis.    Remain elevated   Labs pending 15. Hyponatremia: Progressive drop noted. Started fluid restriction and monitor.    Na 129 on 1/10   Labs pending 16. Sore throat/Cough: On Mucinex. Added cepacol lozenges also.  17. ?GERD   Added pepcid. 18. Leukocytosis   WBCs 17.1 on 1/10   Labs pending   Cont to monitor 19. ABLA   Hb 10.5 on 1/10   Labs pending   Cont to monitor  LOS (Days) 5 A FACE TO FACE EVALUATION WAS PERFORMED  Ankit Karis Juba 06/17/2017 9:15 AM

## 2017-06-17 NOTE — Progress Notes (Addendum)
Called to room by patient states my blood sugar is dropping, CBG 52, symptomatic with dizziness , lightheadedness,perspiration and some irritability, patient refused glucose gel,but was able to  provided po liquids and food, closely monitor assisted until CBG was stabilized. Refer to results for additional data.

## 2017-06-17 NOTE — Progress Notes (Signed)
Occupational Therapy Session Note  Patient Details  Name: Matthew Riley MRN: [id number] Date of Birth: [birth year]  Today's Date: 06/17/2017 OT Individual Time: 1400-1439 OT Individual Time Calculation (min): 39 min   Short Term Goals: Week 1:  OT Short Term Goal 1 (Week 1): Pt will transfer to/from toilet with supervision. OT Short Term Goal 2 (Week 1): Pt will complete showering at sit<>stand level maintaining standing balance with Minguard assist.  OT Short Term Goal 3 (Week 1): Pt will complete 3/3 parts of LB dressing with supervision. OT Short Term Goal 4 (Week 1): Pt will engage in standing activity >67mn prior to needing seated rest break.   Skilled Therapeutic Interventions/Progress Updates:    Pt greeted EOB, all self care needs met. Ready to go. He ambulated with RW and supervision to gym. Worked on dynamic balance remediation with pt standing on blue foam during modified leisure participation. Pt shooting basketball free throws in hoop and completing ball bounce backs with use of trampoline. Pt requiring min guard-close supervision for balance without UE support. Able to stand for 1 minute bouts before requiring seated rest breaks. Afterwards issued him walker bag, and practiced kitchen mobility in therapy apartment. While pt transported items from cabinets, fridge, and pantry, educated him on DME safety and environmental modifications to implement in order to increase ease of simple meal prep completion at home. Pt receptive to education. 1 rest break required, sitting in couch. Sit<stand with supervision. He ambulated back to room and was left EOB with all needs within reach.   Therapy Documentation Precautions:  Precautions Precautions: Fall Restrictions Weight Bearing Restrictions: No Vital Signs: Therapy Vitals Pulse Rate: (!) 104 BP: 115/81 Pain: Pain Assessment Pain Assessment: 0-10 Pain Score: 4  Faces Pain Scale: Hurts a little bit Pain Intervention(s): Medication  (See eMAR) ADL: ADL ADL Comments: Please see functional navigator for ADL status      See Function Navigator for Current Functional Status.  Therapy/Group: Individual Therapy  Pearson Reasons A Kyiesha Millward 06/17/2017, 3:50 PM

## 2017-06-17 NOTE — Progress Notes (Signed)
Occupational Therapy Session Note  Patient Details  Name: Matthew Riley MRN: [id number] Date of Birth: 1976/05/02  Today's Date: 06/17/2017 OT Individual Time: 9476-5465 OT Individual Time Calculation (min): 65 min    Short Term Goals: Week 1:  OT Short Term Goal 1 (Week 1): Pt will transfer to/from toilet with supervision. OT Short Term Goal 2 (Week 1): Pt will complete showering at sit<>stand level maintaining standing balance with Minguard assist.  OT Short Term Goal 3 (Week 1): Pt will complete 3/3 parts of LB dressing with supervision. OT Short Term Goal 4 (Week 1): Pt will engage in standing activity >46min prior to needing seated rest break.   Skilled Therapeutic Interventions/Progress Updates:    Pt seen this session for ADL training with a focus on LLE stability and balance.  Pt used RW to ambulate to shower then back to bed to dress. In shower pt informed therapist when he would be standing to received S for safety, otherwise he can bathe seated with set up.  Pt donned clothing from EOB educated pt and his dtr on how to don TED hose using plastic bag to assist donning.  He then stood at sink for grooming. Good stance for BOS but needed cues to soften L knee to allow for more stability.   At sink he worked on squats 15x with tactile cues to push hips back.  He then worked on ambulation with RW with a focus on upright posture and core control with steadying A.  Pt ambulated over 200 ft.  Improving activity tolerance. Pt returned to room, resting EOB with his daughter in the room with him.  Therapy Documentation Precautions:  Precautions Precautions: Fall Restrictions Weight Bearing Restrictions: No   Pain: Pain Assessment Pain Assessment: No/denies pain ADL: ADL ADL Comments: Please see functional navigator for ADL status    See Function Navigator for Current Functional Status.   Therapy/Group: Individual Therapy  Aarica Wax 06/17/2017, 11:14 AM

## 2017-06-17 NOTE — Significant Event (Signed)
Marissa Nestle ,PA was informed about low blood sugar at lunch time.

## 2017-06-17 NOTE — Progress Notes (Signed)
Patient's blood sugar dropped to 40's before lunch. He has had elevated BS in am last two days and received 11 units SSI which likely contributed to drop in BS. Will decrease am insulin to 20 units and change SSI to sensitive scale. Also note increase in LFTs as well as dehydration. Nursing to encourage increasing fluid intake.

## 2017-06-18 ENCOUNTER — Inpatient Hospital Stay (HOSPITAL_COMMUNITY): Payer: Self-pay | Admitting: Physical Therapy

## 2017-06-18 ENCOUNTER — Inpatient Hospital Stay (HOSPITAL_COMMUNITY): Payer: Self-pay | Admitting: Occupational Therapy

## 2017-06-18 LAB — GLUCOSE, CAPILLARY
GLUCOSE-CAPILLARY: 264 mg/dL — AB (ref 65–99)
GLUCOSE-CAPILLARY: 298 mg/dL — AB (ref 65–99)
GLUCOSE-CAPILLARY: 349 mg/dL — AB (ref 65–99)
GLUCOSE-CAPILLARY: 381 mg/dL — AB (ref 65–99)
Glucose-Capillary: 106 mg/dL — ABNORMAL HIGH (ref 65–99)
Glucose-Capillary: 115 mg/dL — ABNORMAL HIGH (ref 65–99)
Glucose-Capillary: 275 mg/dL — ABNORMAL HIGH (ref 65–99)
Glucose-Capillary: 323 mg/dL — ABNORMAL HIGH (ref 65–99)

## 2017-06-18 LAB — BASIC METABOLIC PANEL
Anion gap: 7 (ref 5–15)
BUN: 31 mg/dL — ABNORMAL HIGH (ref 6–20)
CALCIUM: 9.1 mg/dL (ref 8.9–10.3)
CHLORIDE: 95 mmol/L — AB (ref 101–111)
CO2: 25 mmol/L (ref 22–32)
CREATININE: 1.11 mg/dL (ref 0.61–1.24)
GFR calc non Af Amer: >60 mL/min (ref >60)
Glucose, Bld: 359 mg/dL — ABNORMAL HIGH (ref 65–99)
Potassium: 4.8 mmol/L (ref 3.5–5.1)
SODIUM: 127 mmol/L — AB (ref 135–145)

## 2017-06-18 LAB — GLUCOSE, RANDOM: Glucose, Bld: 362 mg/dL — ABNORMAL HIGH (ref 65–99)

## 2017-06-18 MED ORDER — INSULIN ASPART 100 UNIT/ML ~~LOC~~ SOLN
5.0000 [IU] | Freq: Once | SUBCUTANEOUS | Status: AC
  Administered 2017-06-18: 5 [IU] via SUBCUTANEOUS

## 2017-06-18 MED ORDER — SODIUM CHLORIDE 0.9 % IV SOLN
INTRAVENOUS | Status: DC
  Administered 2017-06-18: 22:00:00 via INTRAVENOUS

## 2017-06-18 MED ORDER — SODIUM CHLORIDE 0.9 % IV SOLN: INTRAVENOUS | Status: DC

## 2017-06-18 MED ORDER — SODIUM CHLORIDE 0.9 % IV SOLN
INTRAVENOUS | Status: AC
  Administered 2017-06-18: 11:00:00 via INTRAVENOUS

## 2017-06-18 NOTE — Progress Notes (Signed)
Inpatient Diabetes Program Recommendations  AACE/ADA: New Consensus Statement on Inpatient Glycemic Control (2015)  Target Ranges:  Prepandial:   less than 140 mg/dL      Peak postprandial:   less than 180 mg/dL (1-2 hours)      Critically ill patients:  140 - 180 mg/dL   Lab Results  Component Value Date   GLUCAP 323 (H) 06/18/2017   HGBA1C 12.9 (H) 03/08/2017    Review of Glycemic Control  Diabetes history: DM2 Outpatient Diabetes medications: 70/30 38 units bid, Novolog 5 units tid Current orders for Inpatient glycemic control: Novolog 0-9 units tidwc and hs, 70/30 20 units QAM, 35 units QPM  Agree with decreasing Novolog to 0-9 units tidwc and hs  Inpatient Diabetes Program Recommendations:    Give 70/30 insulin with a meal. Increase am 70/30 to 30 units. D/C Novolog 5 units tidwc since pt on mixed insulin and does not need additional meal coverage.  Will follow daily.  Thank you. Ailene Ards, RD, LDN, CDE Inpatient Diabetes Coordinator [phone]

## 2017-06-18 NOTE — Progress Notes (Addendum)
Physical Therapy Note  Patient Details  Name: Matthew Riley MRN: [id number] Date of Birth: [birth year] Today's Date: 06/18/2017    Time: [phone] 54 minutes  1:1 No c/o pain.  Pt performs gait in controlled environment with RW with supervision.  Obstacle negotiation stepping over obstacles and up curb step with close supervision and cuing for safety with RW.  Gait without AD 50' and 100' with min A, cues for posture and manual facilitation to improve trunk rotation and increase cadence during gait.  Standing coordination/balance with tapping 4 colored circles in various patterns.  Pt requires min/mod A for standing balance, pt able to complete 4 color patterns with 100% accuracy.  UE and core strengthening per pt request with 6kg med ball for trunk PNFs, trunk rotation, shoulder press and chest all 2 x 15.  Nustep for coordination and UE/LE strengthening x 6 minutes level 5.   Time: 1345-1425 40 minutes  1:1 No c/o pain.  Gait throughout unit with RW with distant supervision.  Stair negotiation for balance and coordination training with focus on eccentric control and foot placement 3 x 8 stairs with bilat handrails with supervision for balance, min cues for coordination.  Step downs forward and laterally from 3'' step with bilat UE support with focus on eccentric control with frequent knee buckling noted on Lt, pt able to control with use of UEs.  Squat and reach task with peg board activity with pt able to safely perform squatting to 6'' from floor and come up to full standing with close supervision. Pt is happy with improvements and states "I am much stronger than I used to be". [name] 06/18/2017, 10:25 AM

## 2017-06-18 NOTE — Progress Notes (Signed)
Occupational Therapy Session Note  Patient Details  Name: Matthew Riley MRN: [id number] Date of Birth: [birth year]  Today's Date: 06/18/2017 OT Individual Time: 8469-6295 and 1120-1205 OT Individual Time Calculation (min): 60 min and 45 min   Short Term Goals: Week 1:  OT Short Term Goal 1 (Week 1): Pt will transfer to/from toilet with supervision. OT Short Term Goal 2 (Week 1): Pt will complete showering at sit<>stand level maintaining standing balance with Minguard assist.  OT Short Term Goal 3 (Week 1): Pt will complete 3/3 parts of LB dressing with supervision. OT Short Term Goal 4 (Week 1): Pt will engage in standing activity >69mn prior to needing seated rest break.   Skilled Therapeutic Interventions/Progress Updates:    Visit 1:  Pain:Pain Score: 6  - low back, RN aware  Pt seen this session for BADL training with a focus on balance. Pt sat to EOB to pick clothing out of bag.  He used RW with close S to shower. As he stepped into shower toward his R he leaned too far with his upper body and lost his balance to the R with min A to recover.  Delayed balance reactions.  Cued pt to pay attention to his foot placement. Pt sat on seat to shower, S when standing. Pt dried off with cues to fully dry feet and then transferred to toilet. Toileted with S. He then returned to room to dress with set up to A pt with donning TED hose.  Pt worked on standing balance at the sink using good body mechanics while grooming. Discussed his discharge plans and reviewed discharge goals.   Visit 2: Pain:no c/o pain Pt had just had IV tx set up, so spent first part of session in pt's room.  Pt worked on donning and tying shoes with set up, sit to stand 12x from low recliner seat without A from RW with close S.  He then used RW to ambulate to tub room to practice tub bench transfers to his R side with S only.  Reviewed set up of tub bench. He then ambulated to gym to work on dynamic standing balance with no UE  support during a 10 min game of Jenga. Pt used good strategies of wt shifting with min cues to keep L knee soft to avoid locking his knee out. Pt returned to room with RW and sat to EOB to eat lunch. Pt in room with all needs met.  Therapy Documentation Precautions:  Precautions Precautions: Fall Restrictions Weight Bearing Restrictions: No    ADL: ADL ADL Comments: Please see functional navigator for ADL status   See Function Navigator for Current Functional Status.   Therapy/Group: Individual Therapy  SBayard1/15/2019, 8:45 AM

## 2017-06-18 NOTE — Progress Notes (Signed)
[address] PHYSICAL MEDICINE & REHABILITATION     PROGRESS NOTE  Subjective/Complaints:  Pt seen sitting at EOB this AM.  He slept well overnight.  He notes persistent issues with labile CBGs.    ROS:  Denies CP, SOB, N/V/D.  Objective: Vital Signs: Blood pressure (!) 138/95, pulse 97, temperature 97.8 F (36.6 C), temperature source Oral, resp. rate 17, height 6\' 2"  (1.88 m), weight 91.9 kg (202 lb 10.4 oz), SpO2 97 %. No results found. Recent Labs    06/17/17 0834  WBC 9.9  HGB 10.7*  HCT 31.6*  PLT 451*   Recent Labs    06/15/17 1141 06/17/17 0834  NA  --  129*  K  --  4.3  CL  --  100*  GLUCOSE 438* 269*  BUN  --  28*  CREATININE  --  0.97  CALCIUM  --  8.7*   CBG (last 3)  Recent Labs    06/17/17 2237 06/18/17 0024 06/18/17 0705  GLUCAP 105* 275* 264*    Wt Readings from Last 3 Encounters:  06/18/17 91.9 kg (202 lb 10.4 oz)  06/11/17 92.6 kg (204 lb 2.3 oz)  05/16/17 93.7 kg (206 lb 9.6 oz)    Physical Exam:  BP (!) 138/95 (BP Location: Left Arm)   Pulse 97   Temp 97.8 F (36.6 C) (Oral)   Resp 17   Ht 6\' 2"  (1.88 m)   Wt 91.9 kg (202 lb 10.4 oz)   SpO2 97%   BMI 26.02 kg/m  Constitutional: He appears well-developed and well-nourished. No distress.  HENT: Normocephalic and atraumatic.  Eyes: EOM are normal. No discharge.  Cardiovascular: RRR. No JVD. Respiratory: Effort normal and breath sounds normal.  GI: Bowel sounds are normal. He exhibits no distension. Musculoskeletal: He exhibits no edema, no tenderness.  Neurological: He is alert and oriented.  Mild dysarthria.  Able to follow simple one and two step commands without difficulty. Motor: 4+/5 throughout  (stable) Skin: Skin is warm and dry. He is not diaphoretic.  Psychiatric: Flat.  Assessment/Plan: 1. Functional deficits secondary to debility which require 3+ hours per day of interdisciplinary therapy in a comprehensive inpatient rehab setting. Physiatrist is providing close team  supervision and 24 hour management of active medical problems listed below. Physiatrist and rehab team continue to assess barriers to discharge/monitor patient progress toward functional and medical goals.  Function:  Bathing Bathing position   Position: Shower  Bathing parts Body parts bathed by patient: Right arm, Left arm, Chest, Abdomen, Front perineal area, Buttocks, Right upper leg, Left upper leg, Right lower leg, Left lower leg Body parts bathed by helper: Back  Bathing assist Assist Level: Set up, Supervision or verbal cues(S when standing)      Upper Body Dressing/Undressing Upper body dressing   What is the patient wearing?: Pull over shirt/dress     Pull over shirt/dress - Perfomed by patient: Thread/unthread right sleeve, Thread/unthread left sleeve, Put head through opening, Pull shirt over trunk          Upper body assist Assist Level: Set up      Lower Body Dressing/Undressing Lower body dressing   What is the patient wearing?: Pants, Ted Hose, Shoes     Pants- Performed by patient: Thread/unthread right pants leg, Thread/unthread left pants leg, Pull pants up/down   Non-skid slipper socks- Performed by patient: Don/doff right sock, Don/doff left sock       Shoes - Performed by patient: Don/doff right shoe, Don/doff  left shoe         TED Hose - Performed by helper: Don/doff right TED hose, Don/doff left TED hose  Lower body assist Assist for lower body dressing: Supervision or verbal cues      Toileting Toileting   Toileting steps completed by patient: Adjust clothing prior to toileting, Performs perineal hygiene, Adjust clothing after toileting   Toileting Assistive Devices: Grab bar or rail  Toileting assist Assist level: Supervision or verbal cues   Transfers Chair/bed transfer Chair/bed transfer activity did not occur: N/A Chair/bed transfer method: Stand pivot, Ambulatory Chair/bed transfer assist level: Touching or steadying assistance (Pt  > 75%) Chair/bed transfer assistive device: Armrests, Patent attorney     Max distance: 150 ft Assist level: Touching or steadying assistance (Pt > 75%)   Wheelchair     Max wheelchair distance: 146ft  Assist Level: Supervision or verbal cues  Cognition Comprehension Comprehension assist level: Follows complex conversation/direction with extra time/assistive device  Expression Expression assist level: Expresses complex ideas: With extra time/assistive device  Social Interaction Social Interaction assist level: Interacts appropriately with others - No medications needed.  Problem Solving Problem solving assist level: Solves complex problems: With extra time  Memory Memory assist level: More than reasonable amount of time    Medical Problem List and Plan: 1.  Deficits with mobility and ability to complete ADLs secondary to debility.   Cont CIR 2.  DVT Prophylaxis/Anticoagulation: Pharmaceutical: Lovenox 3. Pain Management:continue Ultram prn.     Gabapentin for pain bilateral hands and BLE, increased to 300 3 times a day on 1/12.   Controlled 4. Mood: LCSW to follow for evaluation and support.  5. Neuropsych: This patient is capable of making decisions on his own behalf. 6. Skin/Wound Care: Maintain adequate nutritional  and hydration status.  7. Fluids/Electrolytes/Nutrition: Monitor I/O--strict.   8. Sepsis due to aspiration PNA/strep viridans bacteremia:    Ceftriaxone and Doxy d/ced on 1/14 9. Leucocytosis: Resolved   Continue to monitor for signs of infection.    PNA resolving, CXR 1/10 reviewed, showing improving PNA   WBCs 9/9 on 1/14 10. CAD with acute systolic CHF: Monitor weights daily. Lasix on hold due to AKI.     Filed Weights   06/16/17 0435 06/17/17 0334 06/18/17 0124  Weight: 91 kg (200 lb 9.6 oz) 91.8 kg (202 lb 6.4 oz) 91.9 kg (202 lb 10.4 oz)  11. BLE ulcers: Due to trauma--on doxycycline.    ABI's WNL. 12.  RA: Recent diagnosis with  addition of methotrexate last month.    Avoid NSAIDs with AKI and recent MI.  Hold  Methotrexate.  13. T1DM with neuropathy: Used 70/30 insulin at home with [facility] regimen decreased to NovoLog 70/3025 20 units with breakfast and 35 units with supper   SSI changed to sensitive   Labile with hypoglycemia, will add bolus IVF, should start to stabilize with d/c dextrose as well 14. Abnormal LFTs/Recent electrolyte abnormalities: Due to GI issues and sepsis.    Remains elevated 1/14 15. Hyponatremia: Progressive drop noted. Started fluid restriction and monitor.    Na 129 on 1/14   Cont to monitor 16. Sore throat/Cough: On Mucinex. Added cepacol lozenges also.  17. ?GERD   Added pepcid. 18. ABLA   Hb 10.7 on 1/14   Cont to monitor  LOS (Days) 6 A FACE TO FACE EVALUATION WAS PERFORMED  Matthew Riley 06/18/2017 9:19 AM

## 2017-06-18 NOTE — Care Management Note (Signed)
[facility] Individual Statement of Services  Patient Name:  Matthew Riley  Date:  06/18/2017  Welcome to the [facility]patient Rehabilitation [facility].  Our goal is to provide you with an individualized program based on your diagnosis and situation, designed to meet your specific needs.  With this comprehensive rehabilitation program, you will be expected to participate in at least 3 hours of rehabilitation therapies Monday-Friday, with modified therapy programming on the weekends.  Your rehabilitation program will include the following services:  Physical Therapy (PT), Occupational Therapy (OT), Speech Therapy (ST), 24 hour per day rehabilitation nursing, Neuropsychology, Case Management (Social Worker), Rehabilitation Medicine, Nutrition Services and Pharmacy Services  Weekly team conferences will be held on Wednesdays to discuss your progress.  Your Social Worker will talk with you frequently to get your input and to update you on team discussions.  Team conferences with you and your family in attendance may also be held.  Expected length of stay: 10-12 days    Overall anticipated outcome:  Modified independent  Depending on your progress and recovery, your program may change. Your Social Worker will coordinate services and will keep you informed of any changes. Your Social Worker's name and contact numbers are listed  below.  The following services may also be recommended but are not provided by the Inpatient Rehabilitation Center:   Driving Evaluations  Home Health Rehabiltiation Services  Outpatient Rehabilitation Services  Vocational Rehabilitation   Arrangements will be made to provide these services after discharge if needed.  Arrangements include referral to agencies that provide these services.  Your insurance has been verified to be:  none Your primary doctor is:  Charity fundraiser International aid/development worker and Well[facility])  Pertinent information will be shared with your doctor  and your insurance company.   Social Worker:  [place], [place] [phone] or ([phone]   Information discussed with and copy given to patient by: Amada Jupiter, 06/18/2017, 3:42 PM

## 2017-06-19 ENCOUNTER — Inpatient Hospital Stay (HOSPITAL_COMMUNITY): Payer: Self-pay | Admitting: Occupational Therapy

## 2017-06-19 ENCOUNTER — Inpatient Hospital Stay (HOSPITAL_COMMUNITY): Payer: Self-pay | Admitting: Physical Therapy

## 2017-06-19 ENCOUNTER — Inpatient Hospital Stay (HOSPITAL_COMMUNITY): Payer: Self-pay | Admitting: *Deleted

## 2017-06-19 DIAGNOSIS — M792 Neuralgia and neuritis, unspecified: Secondary | ICD-10-CM

## 2017-06-19 LAB — BASIC METABOLIC PANEL
Anion gap: 6 (ref 5–15)
BUN: 32 mg/dL — AB (ref 6–20)
CO2: 25 mmol/L (ref 22–32)
CREATININE: 1.02 mg/dL (ref 0.61–1.24)
Calcium: 8.8 mg/dL — ABNORMAL LOW (ref 8.9–10.3)
Chloride: 99 mmol/L — ABNORMAL LOW (ref 101–111)
Glucose, Bld: 194 mg/dL — ABNORMAL HIGH (ref 65–99)
Potassium: 4.6 mmol/L (ref 3.5–5.1)
SODIUM: 130 mmol/L — AB (ref 135–145)

## 2017-06-19 LAB — GLUCOSE, CAPILLARY
GLUCOSE-CAPILLARY: 96 mg/dL (ref 65–99)
GLUCOSE-CAPILLARY: 169 mg/dL — AB (ref 65–99)
GLUCOSE-CAPILLARY: 127 mg/dL — AB (ref 65–99)
Glucose-Capillary: 260 mg/dL — ABNORMAL HIGH (ref 65–99)
Glucose-Capillary: 25 mg/dL — CL (ref 65–99)
Glucose-Capillary: 40 mg/dL — CL (ref 65–99)

## 2017-06-19 MED ORDER — POLYETHYLENE GLYCOL 3350 17 G PO PACK
17.0000 g | PACK | Freq: Every day | ORAL | Status: DC
  Administered 2017-06-19 – 2017-06-21 (×2): 17 g via ORAL
  Filled 2017-06-19 (×2): qty 1

## 2017-06-19 MED ORDER — SIMETHICONE 80 MG PO CHEW: 80.0000 mg | CHEWABLE_TABLET | Freq: Four times a day (QID) | ORAL | Status: DC | PRN

## 2017-06-19 MED ORDER — DEXTROSE 50 % IV SOLN
INTRAVENOUS | Status: AC
  Administered 2017-06-19: 50 mL
  Filled 2017-06-19: qty 50

## 2017-06-19 NOTE — Progress Notes (Signed)
Physical Therapy Weekly Progress Note  Patient Details  Name: Matthew Riley MRN: [id number] Date of Birth: 09/19/1975  Beginning of progress report period: June 12, 2017 End of progress report period: June 19, 2017   Patient has met 3 of 3 short term goals.  Pt has improved gait and balance and is able to perform functional transfers and ambulation with supervision. Pt continues with deficits in balance without AD and continues with ataxia and coordination deficits.  Patient continues to demonstrate the following deficits muscle weakness, impaired timing and sequencing, ataxia and decreased coordination and decreased standing balance, decreased postural control and decreased balance strategies and therefore will continue to benefit from skilled PT intervention to increase functional independence with mobility.  Patient progressing toward long term goals..  Continue plan of care.  PT Short Term Goals Week 1:  PT Short Term Goal 1 (Week 1): Pt will transfer to WC with supervision assist  PT Short Term Goal 1 - Progress (Week 1): Met PT Short Term Goal 2 (Week 1): Pt will ambulate 150ft with Supervision assist PT Short Term Goal 2 - Progress (Week 1): Met PT Short Term Goal 3 (Week 1): Pt will ascend 12 steps with min assist  PT Short Term Goal 3 - Progress (Week 1): Met Week 2:  PT Short Term Goal 1 (Week 2): =LTG  Skilled Therapeutic Interventions/Progress Updates:  Ambulation/gait training;Balance/vestibular training;Community reintegration;Discharge planning;DME/adaptive equipment instruction;Disease management/prevention;Functional electrical stimulation;Functional mobility training;Neuromuscular re-education;Pain management;Patient/family education;Psychosocial support;Skin care/wound management;Therapeutic Exercise;Splinting/orthotics;Stair training;Therapeutic Activities;UE/LE Coordination activities;UE/LE Strength taining/ROM;Visual/perceptual remediation/compensation;Wheelchair  propulsion/positioning     See Function Navigator for Current Functional Status.   DONAWERTH,KAREN 06/19/2017, 9:19 AM   

## 2017-06-19 NOTE — Progress Notes (Signed)
[place] PHYSICAL MEDICINE & REHABILITATION     PROGRESS NOTE  Subjective/Complaints:  Pt seen lying in bed this AM.  He slept well overnight.  He notes improvement in CBGs.   ROS:  Denies CP, SOB, N/V/D.  Objective: Vital Signs: Blood pressure 105/73, pulse 93, temperature 98.7 F (37.1 C), temperature source Oral, resp. rate 18, height 6\' 2"  (1.88 m), weight 92.7 kg (204 lb 6.4 oz), SpO2 98 %. No results found. Recent Labs    06/17/17 0834  WBC 9.9  HGB 10.7*  HCT 31.6*  PLT 451*   Recent Labs    06/18/17 0851 06/18/17 1608 06/19/17 0810  NA 127*  --  130*  K 4.8  --  4.6  CL 95*  --  99*  GLUCOSE 359* 362* 194*  BUN 31*  --  32*  CREATININE 1.11  --  1.02  CALCIUM 9.1  --  8.8*   CBG (last 3)  Recent Labs    06/18/17 2258 06/19/17 0623 06/19/17 1148  GLUCAP 115* 96 169*    Wt Readings from Last 3 Encounters:  06/19/17 92.7 kg (204 lb 6.4 oz)  06/11/17 92.6 kg (204 lb 2.3 oz)  05/16/17 93.7 kg (206 lb 9.6 oz)    Physical Exam:  BP 105/73 (BP Location: Left Arm)   Pulse 93   Temp 98.7 F (37.1 C) (Oral)   Resp 18   Ht 6\' 2"  (1.88 m)   Wt 92.7 kg (204 lb 6.4 oz)   SpO2 98%   BMI 26.24 kg/m  Constitutional: He appears well-developed and well-nourished. No distress.  HENT: Normocephalic and atraumatic.  Eyes: EOM are normal. No discharge.  Cardiovascular: RRR. No JVD. Respiratory: Effort normal and breath sounds normal.  GI: Bowel sounds are normal. He exhibits no distension. Musculoskeletal: He exhibits no edema, no tenderness.  Neurological: He is alert and oriented.  Mild dysarthria.  Able to follow simple one and two step commands without difficulty. Motor: 4+/5 throughout (improving) Skin: Skin is warm and dry. He is not diaphoretic.  Psychiatric: Flat.  Assessment/Plan: 1. Functional deficits secondary to debility which require 3+ hours per day of interdisciplinary therapy in a comprehensive inpatient rehab setting. Physiatrist is  providing close team supervision and 24 hour management of active medical problems listed below. Physiatrist and rehab team continue to assess barriers to discharge/monitor patient progress toward functional and medical goals.  Function:  Bathing Bathing position   Position: Shower  Bathing parts Body parts bathed by patient: Right arm, Left arm, Chest, Abdomen, Front perineal area, Buttocks, Right upper leg, Left upper leg, Right lower leg, Left lower leg Body parts bathed by helper: Back  Bathing assist Assist Level: Set up, Supervision or verbal cues      Upper Body Dressing/Undressing Upper body dressing   What is the patient wearing?: Pull over shirt/dress     Pull over shirt/dress - Perfomed by patient: Thread/unthread right sleeve, Thread/unthread left sleeve, Put head through opening, Pull shirt over trunk          Upper body assist Assist Level: More than reasonable time      Lower Body Dressing/Undressing Lower body dressing   What is the patient wearing?: Pants, Ted Hose, Shoes     Pants- Performed by patient: Thread/unthread right pants leg, Thread/unthread left pants leg, Pull pants up/down   Non-skid slipper socks- Performed by patient: Don/doff right sock, Don/doff left sock       Shoes - Performed by patient: Don/doff  right shoe, Don/doff left shoe, Fasten right, Fasten left         TED Hose - Performed by helper: Don/doff right TED hose, Don/doff left TED hose  Lower body assist Assist for lower body dressing: Supervision or verbal cues      Toileting Toileting   Toileting steps completed by patient: Adjust clothing prior to toileting, Performs perineal hygiene, Adjust clothing after toileting   Toileting Assistive Devices: Grab bar or rail  Toileting assist Assist level: Supervision or verbal cues   Transfers Chair/bed transfer Chair/bed transfer activity did not occur: N/A Chair/bed transfer method: Stand pivot, Ambulatory Chair/bed transfer  assist level: Touching or steadying assistance (Pt > 75%) Chair/bed transfer assistive device: Armrests, Patent attorney     Max distance: 150 ft Assist level: Touching or steadying assistance (Pt > 75%)   Wheelchair     Max wheelchair distance: 186ft  Assist Level: Supervision or verbal cues  Cognition Comprehension Comprehension assist level: Follows complex conversation/direction with extra time/assistive device  Expression Expression assist level: Expresses complex ideas: With extra time/assistive device  Social Interaction Social Interaction assist level: Interacts appropriately with others - No medications needed.  Problem Solving Problem solving assist level: Solves complex problems: With extra time  Memory Memory assist level: Complete Independence: No helper    Medical Problem List and Plan: 1.  Deficits with mobility and ability to complete ADLs secondary to debility.   Cont CIR 2.  DVT Prophylaxis/Anticoagulation: Pharmaceutical: Lovenox 3. Pain Management:continue Ultram prn.     Gabapentin for pain bilateral hands and BLE, increased to 300 3 times a day on 1/12.   Controlled 4. Mood: LCSW to follow for evaluation and support.  5. Neuropsych: This patient is capable of making decisions on his own behalf. 6. Skin/Wound Care: Maintain adequate nutritional  and hydration status.  7. Fluids/Electrolytes/Nutrition: Monitor I/O--strict.   8. Sepsis due to aspiration PNA/strep viridans bacteremia:    Ceftriaxone and Doxy d/ced on 1/14 9. Leucocytosis: Resolved   Continue to monitor for signs of infection.    PNA resolving, CXR 1/10 reviewed, showing improving PNA   WBCs 9/9 on 1/14 10. CAD with acute systolic CHF: Monitor weights daily. Lasix on hold due to AKI.     Filed Weights   06/17/17 0334 06/18/17 0124 06/19/17 0428  Weight: 91.8 kg (202 lb 6.4 oz) 91.9 kg (202 lb 10.4 oz) 92.7 kg (204 lb 6.4 oz)  11. BLE ulcers: Due to trauma--on doxycycline.     ABI's WNL. 12.  RA: Recent diagnosis with addition of methotrexate last month.    Avoid NSAIDs with AKI and recent MI.  Hold  Methotrexate.  13. T1DM with neuropathy: Used 70/30 insulin at home with [facility] regimen decreased to NovoLog 70/3025 20 units with breakfast and 35 units with supper   SSI changed to sensitive   Appears to be stablizing 14. Abnormal LFTs/Recent electrolyte abnormalities: Due to GI issues and sepsis.    Remains elevated 1/14 15. Hyponatremia: Progressive drop noted. Started fluid restriction and monitor.    Na 130 on 1/16   Cont to monitor 16. Sore throat/Cough: On Mucinex. Added cepacol lozenges also.  17. ?GERD   Added pepcid. 18. ABLA   Hb 10.7 on 1/14   Cont to monitor  LOS (Days) 7 A FACE TO FACE EVALUATION WAS PERFORMED  Ankit Karis Juba 06/19/2017 12:30 PM

## 2017-06-19 NOTE — Patient Care Conference (Signed)
Inpatient RehabilitationTeam Conference and Plan of Care Update Date: 06/19/2017   Time: 2:30 PM    Patient Name: Matthew Riley      Medical Record Number: [id number]  Date of Birth: Aug 10, 1975 Sex: Male         Room/Bed: 4W11C/4W11C-01 Payor Info: Payor: MEDICAID PENDING / Plan: MEDICAID PENDING / Product Type: *No Product type* /    Admitting Diagnosis: Encep AnoxicSepsis  Admit Date/Time:  06/12/2017  5:19 PM Admission Comments: No comment available   Primary Diagnosis:  <principal problem not specified> Principal Problem: <principal problem not specified>  Patient Active Problem List   Diagnosis Date Noted  . Bacteremia   . PNA (pneumonia)   . Acute blood loss anemia   . Acute systolic heart failure (HCC)   . Labile blood glucose   . Hypoglycemia   . Debility   . Neuropathic pain   . Aspiration pneumonia of both lungs (HCC)   . Leukocytosis   . Acute systolic CHF (congestive heart failure) (HCC)   . Ulcers of both lower extremities (HCC)   . Rheumatoid arthritis (HCC)   . 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 Transaminitis   . Cough   . Encephalopathy 06/03/2017  . Acute respiratory failure with hypoxia (HCC)   . Septic shock (HCC)   . Abdominal pain 04/16/2017  . Tobacco abuse 04/16/2017  . Leg wound, right 04/16/2017  . Bilateral leg edema 04/16/2017  . 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 CAD (coronary artery disease) 04/16/2017  . Hyperglycemia 04/16/2017  . Hypertension   . Uncontrolled type 1 diabetes mellitus with hyperglycemia (HCC)   . Neuropathy   . Nausea & vomiting 03/08/2017  . GERD (gastroesophageal reflux disease) 03/08/2017  . Hyponatremia 03/08/2017  . AKI (acute kidney injury) (HCC) 03/08/2017  . Hyperkalemia 03/08/2017  . DKA, type 1 [facility])     Expected Discharge Date: Expected Discharge Date: 06/23/17  Team Members Present: Physician leading conference: Dr. Maryla Morrow Nurse Present: Chana Bode, RN PT Present: Judieth Keens, PT OT Present: Roney Mans, OT PPS Coordinator present : Tora Duck, RN, CRRN     Current Status/Progress Goal Weekly Team Focus  Medical   Deficits with mobility and ability to complete ADLs secondary to debility  Improve mobility, safety, CBGs, ABLA  See above   Bowel/Bladder   Continent of bladder/bowel has stool softener and prn laxative ordered prn  . Remain contient of bladder/bowels  Assess QS toileting needs and address effectivness of meds    Swallow/Nutrition/ Hydration             ADL's   close S with self care using RW, delayed balance reactions  mod I with self care and simple meal prep  ADL training, balance training, LLE strengthening, activity tolerance   Mobility   supervision gait and transfers with RW  mod I  d/c planning, dynamic balance   Communication             Safety/Cognition/ Behavioral Observations  Alert/oriented up in room x1 assistaance  able to verbalize his needs apprpriately, no report of fall or injrues  No reports or fall/ injuries pr skin abrasion while on Rehab  Assess and monitor safety concerns , patient up with walker and staff prn, tolerate well w/o reports of falls or injuries, gait slightly unsteady   Pain   Verbalize discomfort pain to legs with spasms to LE's frequently ( Q4-6 hrs ) rating pain and discomfort  8/10 on pain scale  <3   Maintain pain at a tolerable level for pain , assess qs and prn , document the effectivness of pain rmeds   Skin   Wounds to bilateral lower legs healing well, continue santyl wound care daily. scanty amt, drainage to leg eound  Maintain skin integrity  Assess and monitor wound site and notify MD/PA of changes, provide treatment as ordered    Rehab Goals Patient on target to meet rehab goals: Yes *See Care Plan and progress notes for long and short-term goals.     Barriers to Discharge  Current Status/Progress Possible Resolutions Date Resolved   Physician     Medical stability     See above  Therapies, optimize DM meds, follow labs      Nursing                  PT                    OT                  SLP                SW                Discharge Planning/Teaching Needs:  Pt to d/c home with sister who does work f/t days.  Pt will need to reach a mod ind level of function as he will b alone most of the day.  Teaching to be planned closer to d/c if unable to reach mod ind.   Team Discussion:  BSs fluctuating.  Cont b/b.  On track for mod ind goals.  Team feels pt ready for d/c this weekend but pt concerned.  SW to follow up.  No concerns.  Revisions to Treatment Plan:  None    Continued Need for Acute Rehabilitation Level of Care: The patient requires daily medical management by a physician with specialized training in physical medicine and rehabilitation for the following conditions: Daily direction of a multidisciplinary physical rehabilitation program to ensure safe treatment while eliciting the highest outcome that is of practical value to the patient.: Yes Daily medical management of patient stability for increased activity during participation in an intensive rehabilitation regime.: Yes Daily analysis of laboratory values and/or radiology reports with any subsequent need for medication adjustment of medical intervention for : Diabetes problems  Matthew Riley 06/19/2017, 2:58 PM

## 2017-06-19 NOTE — Progress Notes (Signed)
Occupational Therapy Session Note  Patient Details  Name: Matthew Riley MRN: [id number] Date of Birth: 04-29-1976  Today's Date: 06/19/2017 OT Individual Time: 1000-1100 OT Individual Time Calculation (min): 60 min    Short Term Goals: Week 1:  OT Short Term Goal 1 (Week 1): Pt will transfer to/from toilet with supervision. OT Short Term Goal 2 (Week 1): Pt will complete showering at sit<>stand level maintaining standing balance with Minguard assist.  OT Short Term Goal 3 (Week 1): Pt will complete 3/3 parts of LB dressing with supervision. OT Short Term Goal 4 (Week 1): Pt will engage in standing activity >17min prior to needing seated rest break.   Skilled Therapeutic Interventions/Progress Updates:    1:1 SElf care retraining with focus on functional ambulation with and without RW, showering sit to stand, dressing sit to stand from lower surface etc. Pt overall supervision sit to stand and with functional ambulation with RW. Pt requires min A without RW. Pt stood with supervision to participate in brushing teeth with no Ue support.   Pt ambulated to [facility] gym to participate in balance tasks without UE support and dynamic platform using ankle strageties to maintain balance.    Therapy Documentation Precautions:  Precautions Precautions: Fall Restrictions Weight Bearing Restrictions: No Pain:  no c/o pain ADL: ADL ADL Comments: Please see functional navigator for ADL status   See Function Navigator for Current Functional Status.   Therapy/Group: Individual Therapy  Roney Mans [facility] 06/19/2017, 12:49 PM

## 2017-06-19 NOTE — Progress Notes (Signed)
Physical Therapy Note  Patient Details  Name: Matthew Riley MRN: [id number] Date of Birth: 02/24/1976 Today's Date: 06/19/2017    Time: 1300-1409 69 minutes  1:1 No c/o pain.  Pt performs gait throughout unit with RW with supervision.  Gait training without AD with focus on widened BOS and increased cadence to increase fluidity and efficiency.  Pt able to improve cadence with multiple reps and min A.  Pt with 1 LOB requiring min A to correct.  Gait outdoors on uneven surfaces, incline/decline and stair negotiation with 1 railing with RW with supervision.  UE strengthening per pt request with 7# wt bilat for 2 x 15 chest press, shoulder press, shoulder diagonals, bicep curls.  nustep for UE/LE strength and coordination x 5 minutes level 5.   Gera Inboden 06/19/2017, 2:16 PM

## 2017-06-19 NOTE — Evaluation (Signed)
Recreational Therapy Assessment and Plan  Patient Details  Name: Matthew Riley MRN: [id number] Date of Birth: 27-Apr-1976 Today's Date: 06/19/2017  Rehab Potential: Good ELOS: 7 days   Assessment Problem List:      Patient Active Problem List   Diagnosis Date Noted  . Acute systolic heart failure ([place])   . Labile blood glucose   . Hypoglycemia   . Debility   . Neuropathic pain   . Aspiration pneumonia of both lungs ([place])   . Leukocytosis   . Acute systolic CHF (congestive heart failure) ([place])   . Ulcers of both lower extremities ([place])   . Rheumatoid arthritis ([place])   . Diabetic peripheral neuropathy associated with type 1 diabetes mellitus ([place])   . Transaminitis   . Cough   . Encephalopathy 06/03/2017  . Acute respiratory failure with hypoxia ([place])   . Septic shock ([place])   . Abdominal pain 04/16/2017  . Tobacco abuse 04/16/2017  . Leg wound, right 04/16/2017  . Bilateral leg edema 04/16/2017  . 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 CAD (coronary artery disease) 04/16/2017  . Hyperglycemia 04/16/2017  . Hypertension   . Uncontrolled type 1 diabetes mellitus with hyperglycemia ([place])   . Neuropathy   . Nausea & vomiting 03/08/2017  . GERD (gastroesophageal reflux disease) 03/08/2017  . Hyponatremia 03/08/2017  . AKI (acute kidney injury) ([place]) 03/08/2017  . Hyperkalemia 03/08/2017  . DKA, type 1 ([place])     Past Medical History:  Past Medical History:  Diagnosis Date  . Coronary artery disease   . Diabetes mellitus without complication ([place])   . GERD (gastroesophageal reflux disease)   . Hepatitis B   . Hypertension   . Renal disorder   . Rheumatoid arthritis ([place])   . Stroke [facility]) 02/2017   Past Surgical History:       Past Surgical History:  Procedure Laterality Date  . CORONARY ANGIOPLASTY WITH STENT PLACEMENT      Assessment & Plan Clinical Impression: Patient is a 42 y.o.malewith history of HTN, CKD stage III,  chronic LBP, T1DM-- uncontrolled,CVA 02/2017 (moved to [place] to be with family),CAD s/p stenting with DKA 04/2017 who was admitted on 06/03/17 after found unresponsive at home with evidence of aspiration. UDS positive for THC and benzos. History taken from chart review. Patient with reports of N/V with diarrhea and was intubated due to decreased LOC and treated for sepsis with pressors and broad spectrum antibiotics. CT chest/abdomen showed diffuse bilateral PNA. One blood culture positive for strep viridans. CT abdomen repeated showing anasarca but no abscess or abnormality. CT head reviewed, negative for acute process. He self extubated on tolerated extubation on 11/5 and respiratory status stable. Hypotension treated with fluid boluses. Electrolyte abnormality resolving and antibiotics narrowed to ceftriaxone on 01/8. BLE wounds treated with local measures as well as doxycyline--ABI's ordered for work up. Patient transferred to CIR on 06/12/2017.   Pt presents with decreased activity tolerance, decreased functional mobility, decreased balance, decreased awareness of local leisure resources Limiting pt's independence with leisure/community pursuits.   Leisure History/Participation Premorbid leisure interest/current participation: Community - Building control surveyor - Travel (Comment);Community - Engineer, agricultural - Insurance underwriter Other Leisure Interests: Television;Cooking/Baking Leisure Participation Style: With Family/Friends;Alone Awareness of Community Resources: Poor-identify 1 post discharge leisure resource Psychosocial / Spiritual Does patient have pets?: Yes(pt's sister has a Magazine features editor) Social interaction - Mood/Behavior: Cooperative Engineer, drilling for Education?: Yes Recreational Therapy Orientation Orientation -Reviewed with patient: Available activity resources Strengths/Weaknesses Patient Strengths/Abilities: Willingness to participate Patient weaknesses:  Physical  limitations;Minimal Premorbid Leisure Activity TR Patient demonstrates impairments in the following area(s): Endurance;Motor  Plan Rec Therapy Plan Is patient appropriate for Therapeutic Recreation?: Yes Rehab Potential: Good Treatment times per week: Min 1 TR session/group >20 minutes during LOS Estimated Length of Stay: 7 days TR Treatment/Interventions: Adaptive equipment instruction;Group participation (Comment);Therapeutic exercise;Wheelchair propulsion/positioning;1:1 session;Community reintegration;Leisure education;Recreation/leisure participation;UE/LE Chartered certified accountant training;Functional mobility training;Patient/family education;Therapeutic activities  Recommendations for other services: None   Discharge Criteria: Patient will be discharged from TR if patient refuses treatment 3 consecutive times without medical reason.  If treatment goals not met, if there is a change in medical status, if patient makes no progress towards goals or if patient is discharged from hospital.  The above assessment, treatment plan, treatment alternatives and goals were discussed and mutually agreed upon: by patient  [place] 06/19/2017, 3:21 PM

## 2017-06-19 NOTE — Progress Notes (Signed)
Occupational Therapy Session Note  Patient Details  Name: Matthew Riley MRN: [id number] Date of Birth: 1975-11-05  Today's Date: 06/19/2017 OT Individual Time: 1030-1100 OT Individual Time Calculation (min): 30 min    Short Term Goals: Week 1:  OT Short Term Goal 1 (Week 1): Pt will transfer to/from toilet with supervision. OT Short Term Goal 2 (Week 1): Pt will complete showering at sit<>stand level maintaining standing balance with Minguard assist.  OT Short Term Goal 3 (Week 1): Pt will complete 3/3 parts of LB dressing with supervision. OT Short Term Goal 4 (Week 1): Pt will engage in standing activity >15mn prior to needing seated rest break.   Skilled Therapeutic Interventions/Progress Updates:    Pt seen this session for IADL training of simple meal prep. Pt ambulated with RW from room to kitchen with S. In kitchen, pt retrieved dishes, utensils, pan, food from fridge using slow and safe techniques using either the RW or the cabinet for support. Pt successfully washed off pan and dishes prior to using and made 2 scrambled eggs demonstrating good safety awareness and no LOB.  Cues with reaching for pan in low cabinet to use one hand for support on counter.  Pt then ambulated back to room with all needs met.    Therapy Documentation Precautions:  Precautions Precautions: Fall Restrictions Weight Bearing Restrictions: No   Pain: Pain Assessment Pain Score: Asleep ADL: ADL ADL Comments: Please see functional navigator for ADL status   See Function Navigator for Current Functional Status.   Therapy/Group: Individual Therapy  SAGUIER,JULIA 06/19/2017, 8:54 AM

## 2017-06-20 ENCOUNTER — Inpatient Hospital Stay (HOSPITAL_COMMUNITY): Payer: Self-pay

## 2017-06-20 ENCOUNTER — Inpatient Hospital Stay (HOSPITAL_COMMUNITY): Payer: Self-pay | Admitting: Physical Therapy

## 2017-06-20 ENCOUNTER — Inpatient Hospital Stay (HOSPITAL_COMMUNITY): Payer: Self-pay | Admitting: Occupational Therapy

## 2017-06-20 LAB — GLUCOSE, CAPILLARY
GLUCOSE-CAPILLARY: 204 mg/dL — AB (ref 65–99)
GLUCOSE-CAPILLARY: 84 mg/dL (ref 65–99)
Glucose-Capillary: 126 mg/dL — ABNORMAL HIGH (ref 65–99)
Glucose-Capillary: 257 mg/dL — ABNORMAL HIGH (ref 65–99)
Glucose-Capillary: 30 mg/dL — CL (ref 65–99)

## 2017-06-20 MED ORDER — INSULIN ASPART PROT & ASPART (70-30 MIX) 100 UNIT/ML ~~LOC~~ SUSP: 10.0000 [IU] | Freq: Every day | SUBCUTANEOUS | Status: DC

## 2017-06-20 MED ORDER — DEXTROSE 50 % IV SOLN: 25.0000 g | Freq: Once | INTRAVENOUS | Status: DC

## 2017-06-20 MED ORDER — DEXTROSE 50 % IV SOLN
INTRAVENOUS | Status: AC
  Administered 2017-06-20: 50 mL
  Filled 2017-06-20: qty 50

## 2017-06-20 MED ORDER — INSULIN ASPART PROT & ASPART (70-30 MIX) 100 UNIT/ML ~~LOC~~ SUSP
30.0000 [IU] | Freq: Every day | SUBCUTANEOUS | Status: DC
  Administered 2017-06-20: 30 [IU] via SUBCUTANEOUS

## 2017-06-20 MED ORDER — INSULIN ASPART PROT & ASPART (70-30 MIX) 100 UNIT/ML ~~LOC~~ SUSP
20.0000 [IU] | Freq: Every day | SUBCUTANEOUS | Status: DC
  Administered 2017-06-21 – 2017-06-24 (×4): 20 [IU] via SUBCUTANEOUS
  Filled 2017-06-20: qty 10

## 2017-06-20 NOTE — Progress Notes (Signed)
Occupational Therapy Weekly Progress Note  Patient Details  Name: Matthew Riley MRN: [id number] Date of Birth: 1975/06/14  Beginning of progress report period: June 13, 2017 End of progress report period: June 20, 2017  Today's Date: 06/20/2017 OT Individual Time: 6389-3734 OT Individual Time Calculation (min): 55 min    Patient has met 4 of 4 short term goals.  Pt has been making good progress with his balance, standing endurance and general activity tolerance. Pt is now at a S level with all ADLs and simple meal prep.  Patient continues to demonstrate the following deficits: decreased cardiorespiratoy endurance and decreased balance strategies and therefore will continue to benefit from skilled OT intervention to enhance overall performance with BADL and iADL.  Patient progressing toward long term goals..  Continue plan of care.  OT Short Term Goals Week 1:  OT Short Term Goal 1 (Week 1): Pt will transfer to/from toilet with supervision. OT Short Term Goal 1 - Progress (Week 1): Met OT Short Term Goal 2 (Week 1): Pt will complete showering at sit<>stand level maintaining standing balance with Minguard assist.  OT Short Term Goal 2 - Progress (Week 1): Met OT Short Term Goal 3 (Week 1): Pt will complete 3/3 parts of LB dressing with supervision. OT Short Term Goal 3 - Progress (Week 1): Met OT Short Term Goal 4 (Week 1): Pt will engage in standing activity >37mn prior to needing seated rest break.  OT Short Term Goal 4 - Progress (Week 1): Met Week 2:  OT Short Term Goal 1 (Week 2): STGs = LTGs  Skilled Therapeutic Interventions/Progress Updates:    Pt seen for ADL training in tub room to simulate his home environment set up.  Pt received in room and ambulated with RW to tub room. Pt was able to transfer on and off tub bench with S only, no cues needed.  He bathed with distant S, then dried off and dressed sitting on edge of bench with distant S.  Pt then ambulated back to room  and stood at sink for grooming. Pt resting in bed with all needs met.   Therapy Documentation Precautions:  Precautions Precautions: Fall Restrictions Weight Bearing Restrictions: No  Pain: Pain Assessment Pain Assessment: No/denies pain ADL: ADL ADL Comments: Please see functional navigator for ADL status   See Function Navigator for Current Functional Status.   Therapy/Group: Individual Therapy  Mechel Schutter 06/20/2017, 10:20 AM

## 2017-06-20 NOTE — Progress Notes (Signed)
The on-call PA was notified at the time of incidence but no new orders were obtained.

## 2017-06-20 NOTE — Progress Notes (Signed)
Physical Therapy Session Note  Patient Details  Name: Matthew Riley MRN: [id number] Date of Birth: [birth year]  Today's Date: 06/20/2017 PT Individual Time: 0800-0830 PT Individual Time Calculation (min): 30 min   Short Term Goals: Week 2:  PT Short Term Goal 1 (Week 2): =LTG  Skilled Therapeutic Interventions/Progress Updates: Pt received in restroom; performs clothing management modI, S for gait out of bathroom with RW. Denies pain and agreeable to treatment. Pt dons shoes modI, increased time d/t difficulty with non-skid socks sliding into shoes. Gait to gym with RW and S. Dynamic balance on airex foam pad in parallel bars with ball toss; normal BOS, narrow BOS, staggered stance x1 min each position with S for balance. Standing balance on upside down bosu x1 min with min guard. Gait x90' no AD and minA/min guard, staggered gait pattern with variable step length and width, min cues for upright posture d/t tendency to stand in forward flexed position. Seated UE strengthening exercises 2x10 reps bicep curls, shoulder flexion chest press, shoulder abduction with 5# weights. Gait to return to room no AD and minA/min guard, occasional scissoring gait. Remained seated EOB, alarm intact and all needs in reach.      Therapy Documentation Precautions:  Precautions Precautions: Fall Restrictions Weight Bearing Restrictions: No  See Function Navigator for Current Functional Status.   Therapy/Group: Individual Therapy  [name] 06/20/2017, 8:47 AM

## 2017-06-20 NOTE — Progress Notes (Signed)
Occupational Therapy Session Note  Patient Details  Name: Matthew Riley MRN: [id number] Date of Birth: Nov 09, 1975  Today's Date: 06/20/2017 OT Individual Time: 1420-1500 OT Individual Time Calculation (min): 40 min    Short Term Goals: Week 1:  OT Short Term Goal 1 (Week 1): Pt will transfer to/from toilet with supervision. OT Short Term Goal 2 (Week 1): Pt will complete showering at sit<>stand level maintaining standing balance with Minguard assist.  OT Short Term Goal 3 (Week 1): Pt will complete 3/3 parts of LB dressing with supervision. OT Short Term Goal 4 (Week 1): Pt will engage in standing activity >76min prior to needing seated rest break.   Skilled Therapeutic Interventions/Progress Updates:    Pt seen for OT session focusing on UE strengthening and functional standing balance/endurance. Pt sitting up in bed upon arrival, agreeable to tx session and denying pain. He donned shoes mod I seated EOB.  He ambulated to therapy gym with distant supervision using RW. Pt desired to work on UE strengthening. Pt completed UE strengthening exercises utilizing level 4 (blue), demonstration provided and pt return demonstrated following exercises. Provided pt with HEP handout for use at d/c. All exercises completed x2 sets of 10. Shoulder expansion Diagonal up Diagonal down Bicep curl  Upright row.  Completed dynamic standing task having pt hit beach ball back and forth with therapist using 5# dowel rod, maintaining dynamic balance with supervision and able to self initiate rest breaks. Pt returned to room at end of session in same manner as described above. Pt left seated EOB at end of session, all needs in reach and bed alarm on.  Therapy Documentation Precautions:  Precautions Precautions: Fall Restrictions Weight Bearing Restrictions: No Pain:   No/ denies pain ADL: ADL ADL Comments: Please see functional navigator for ADL status   See Function Navigator for Current Functional  Status.   Therapy/Group: Individual Therapy  Ayslin Kundert L 06/20/2017, 6:59 AM

## 2017-06-20 NOTE — Progress Notes (Signed)
[facility] PHYSICAL MEDICINE & REHABILITATION     PROGRESS NOTE  Subjective/Complaints:  Patient seen sitting up in bed this morning. He states he slept well overnight. He notes his CBGs are better controlled now, however per review patient had hypoglycemic episode last night.  ROS:  Denies CP, SOB, N/V/D.  Objective: Vital Signs: Blood pressure (!) 138/100, pulse 94, temperature 98 F (36.7 C), temperature source Oral, resp. rate 18, height 6\' 2"  (1.88 m), weight 93 kg (205 lb 0.4 oz), SpO2 99 %. No results found. No results for input(s): WBC, HGB, HCT, PLT in the last 72 hours. Recent Labs    06/18/17 0851 06/18/17 1608 06/19/17 0810  NA 127*  --  130*  K 4.8  --  4.6  CL 95*  --  99*  GLUCOSE 359* 362* 194*  BUN 31*  --  32*  CREATININE 1.11  --  1.02  CALCIUM 9.1  --  8.8*   CBG (last 3)  Recent Labs    06/19/17 2124 06/19/17 2158 06/20/17 0628  GLUCAP 25* 127* 204*    Wt Readings from Last 3 Encounters:  06/20/17 93 kg (205 lb 0.4 oz)  06/11/17 92.6 kg (204 lb 2.3 oz)  05/16/17 93.7 kg (206 lb 9.6 oz)    Physical Exam:  BP (!) 138/100 (BP Location: Left Arm)   Pulse 94   Temp 98 F (36.7 C) (Oral)   Resp 18   Ht 6\' 2"  (1.88 m)   Wt 93 kg (205 lb 0.4 oz)   SpO2 99%   BMI 26.32 kg/m  Constitutional: He appears well-developed and well-nourished. No distress.  HENT: Normocephalic and atraumatic.  Eyes: EOM are normal. No discharge.  Cardiovascular: RRR. No JVD. Respiratory: Effort normal and breath sounds normal.  GI: Bowel sounds are normal. He exhibits no distension. Musculoskeletal: He exhibits no edema, no tenderness.  Neurological: He is alert and oriented.  Mild dysarthria.  Able to follow simple one and two step commands without difficulty. Motor: 4+/5 throughout (stable) Skin: Skin is warm and dry. He is not diaphoretic.  Psychiatric: Flat.  Assessment/Plan: 1. Functional deficits secondary to debility which require 3+ hours per day of  interdisciplinary therapy in a comprehensive inpatient rehab setting. Physiatrist is providing close team supervision and 24 hour management of active medical problems listed below. Physiatrist and rehab team continue to assess barriers to discharge/monitor patient progress toward functional and medical goals.  Function:  Bathing Bathing position   Position: Shower  Bathing parts Body parts bathed by patient: Right arm, Left arm, Chest, Abdomen, Front perineal area, Buttocks, Right upper leg, Left upper leg, Right lower leg, Left lower leg, Back Body parts bathed by helper: Back  Bathing assist Assist Level: Set up      Upper Body Dressing/Undressing Upper body dressing   What is the patient wearing?: Pull over shirt/dress     Pull over shirt/dress - Perfomed by patient: Thread/unthread right sleeve, Thread/unthread left sleeve, Put head through opening, Pull shirt over trunk          Upper body assist Assist Level: Set up      Lower Body Dressing/Undressing Lower body dressing   What is the patient wearing?: Pants, Ted Hose, Shoes     Pants- Performed by patient: Thread/unthread right pants leg, Thread/unthread left pants leg, Pull pants up/down   Non-skid slipper socks- Performed by patient: Don/doff right sock, Don/doff left sock       Shoes - Performed by patient:  Don/doff right shoe, Don/doff left shoe, Fasten right, Fasten left         TED Hose - Performed by helper: Don/doff right TED hose, Don/doff left TED hose  Lower body assist Assist for lower body dressing: Supervision or verbal cues      Toileting Toileting   Toileting steps completed by patient: Adjust clothing prior to toileting, Performs perineal hygiene, Adjust clothing after toileting   Toileting Assistive Devices: Grab bar or rail  Toileting assist Assist level: Supervision or verbal cues   Transfers Chair/bed transfer Chair/bed transfer activity did not occur: N/A Chair/bed transfer method:  Stand pivot, Ambulatory Chair/bed transfer assist level: Touching or steadying assistance (Pt > 75%) Chair/bed transfer assistive device: Armrests, Patent attorney     Max distance: 150 ft Assist level: Touching or steadying assistance (Pt > 75%)   Wheelchair     Max wheelchair distance: 159ft  Assist Level: Supervision or verbal cues  Cognition Comprehension Comprehension assist level: Follows complex conversation/direction with extra time/assistive device  Expression Expression assist level: Expresses complex ideas: With extra time/assistive device  Social Interaction Social Interaction assist level: Interacts appropriately with others - No medications needed.  Problem Solving Problem solving assist level: Solves complex problems: With extra time  Memory Memory assist level: Complete Independence: No helper    Medical Problem List and Plan: 1.  Deficits with mobility and ability to complete ADLs secondary to debility.   Cont CIR 2.  DVT Prophylaxis/Anticoagulation: Pharmaceutical: Lovenox 3. Pain Management:continue Ultram prn.     Gabapentin for pain bilateral hands and BLE, increased to 300 3 times a day on 1/12.   Controlled on 1/17 4. Mood: LCSW to follow for evaluation and support.  5. Neuropsych: This patient is capable of making decisions on his own behalf. 6. Skin/Wound Care: Maintain adequate nutritional  and hydration status.  7. Fluids/Electrolytes/Nutrition: Monitor I/O--strict.   8. Sepsis due to aspiration PNA/strep viridans bacteremia:    Ceftriaxone and Doxy d/ced on 1/14 9. Leucocytosis: Resolved   Continue to monitor for signs of infection.    PNA resolving, CXR 1/10 reviewed, showing improving PNA   WBCs 9/9 on 1/14 10. CAD with acute systolic CHF: Monitor weights daily. Lasix on hold due to AKI.     Filed Weights   06/18/17 0124 06/19/17 0428 06/20/17 0448  Weight: 91.9 kg (202 lb 10.4 oz) 92.7 kg (204 lb 6.4 oz) 93 kg (205 lb 0.4 oz)   11. BLE ulcers: Due to trauma--on doxycycline.    ABI's WNL. 12.  RA: Recent diagnosis with addition of methotrexate last month.    Avoid NSAIDs with AKI and recent MI.  Hold  Methotrexate.  13. T1DM with neuropathy: Used 70/30 insulin at home with [facility] regimen decreased to NovoLog 70/30 20 units with breakfast, decreased further to 10 units on 1/18   Novolog 70/30 35 units with supper   SSI changed to sensitive   Extreme hypoglycemia of 20 overnight 14. Abnormal LFTs/Recent electrolyte abnormalities: Due to GI issues and sepsis.    Remains elevated 1/14 15. Hyponatremia: Progressive drop noted. Started fluid restriction and monitor.    Na 130 on 1/16   Cont to monitor 16. Sore throat/Cough: On Mucinex. Added cepacol lozenges also.  17. ?GERD   Added pepcid. 18. ABLA   Hb 10.7 on 1/14   Cont to monitor  LOS (Days) 8 A FACE TO FACE EVALUATION WAS PERFORMED  Valory Wetherby Karis Juba 06/20/2017  8:45 AM

## 2017-06-20 NOTE — Progress Notes (Signed)
Physical Therapy Note  Patient Details  Name: Matthew Riley MRN: [id number] Date of Birth: 1975-10-02 Today's Date: 06/20/2017  1015-1130, 75 min individual tx Pain: none per pt  Pt tired from previous txs, but willing to start with bedside tx.  neuromuscular re-education via multimodal cues and demo for 15 x 1 bil hip internal rotation, alternating R/L ankle pumps; 10 x 2 with towel roll between knees for bil bridging, modified abdominal crunches, bil lower trunk rotation, focusing on smoothness of movement. Sustained stretch x 2 min each R/L obliques with stationary positoning end ranges of lower trunk rotation.  In R/L side lying: 10 x 2 L/R clam shells for R/L hip abduction activation. Pt needed therapeutic rest between sets of each type of ex.  Pt reported that he would like to have a snack around 10pm each evening for BS control.  PT informed Annitta [place], Charity fundraiser. Pt left resting in bed with alarm set and all needs at hand.  See function navigator for current status.  Bengie Kaucher 06/20/2017, 7:50 AM

## 2017-06-20 NOTE — Progress Notes (Signed)
Recreational Therapy Discharge Summary Patient Details  Name: Matthew Riley MRN: [id number] Date of Birth: [birth year] Today's Date: 06/20/2017  Comments on progress toward goals:  During LOS, met with pt to assess leisure interests and assist pt in identifying leisure resources as he recently moved to the [place] area.  Discussed/educated pt on activity analysis with potential modifications for leisure participation and provided pt with information on local fishing opportunities as pt stated this was his primary leisure interest.  Pt is scheduled for discharge home with sister this weekend.  Reasons for discharge: discharge from hospital  Patient/family agrees with progress made and goals achieved: Yes  Nikia Levels 06/20/2017, 11:27 AM

## 2017-06-20 NOTE — Significant Event (Signed)
Hypoglycemic Event  CBG: 30  Treatment: 15 GM carbohydrate snack and D50 IV 50 mL  Symptoms: Sweaty  Follow-up CBG: Time:0014 CBG Result:138  Possible Reasons for Event: Unknown  Comments/MD notified: PA Angiuli notified    Zygmunt Mcglinn O Novalyn Lajara

## 2017-06-20 NOTE — Progress Notes (Signed)
Physical Therapy Session Note  Patient Details  Name: Matthew Riley MRN: [id number] Date of Birth: [birth year]  Today's Date: 06/19/2017 PT Individual Time:1615-170   45 min   Short Term Goals: Week 1:  PT Short Term Goal 1 (Week 1): Pt will transfer to [facility] with supervision assist  PT Short Term Goal 1 - Progress (Week 1): Met PT Short Term Goal 2 (Week 1): Pt will ambulate 158f with Supervision assist PT Short Term Goal 2 - Progress (Week 1): Met PT Short Term Goal 3 (Week 1): Pt will ascend 12 steps with min assist  PT Short Term Goal 3 - Progress (Week 1): Met Week 2:  PT Short Term Goal 1 (Week 2): =LTG  Skilled Therapeutic Interventions/Progress Updates:   Pt received supine in bed and agreeable to PT. Supine>sit transfer without assist and use of rails.   Pt instructed in gait training to rehab gym with RW and distant supervision assist from PT. PT instructed pt in dynamic balance training to toss ball off trampoline 3 x 45 seconds.  Min-supervision assist from PT.   PT instructed pt in balance training with Wii Fit to improve weight shifting in all planes and improve use of ankle strategy to prevent LOB. Penguin slide x 3. Tilt table x 4. Min assist progressing to supervision assist with moderate cues for improved use of weight shifting level.   Gait training to return to room with RW and supervision assist from PT.   Patient  left sitting EOB with call bell in reach and all needs met.       Therapy Documentation Precautions:  Precautions Precautions: Fall Restrictions Weight Bearing Restrictions: No Pain : 0/10   See Function Navigator for Current Functional Status.   Therapy/Group: Individual Therapy  ALorie Phenix1/17/2019, 12:15 AM

## 2017-06-20 NOTE — Progress Notes (Signed)
Pt was alertt and oriented X4 perspiring. Pt had CBG of 40mg /dl and was given orange juice/gram crackers and peanut butter. CBG recheck was 25 mg/dl. D50 was given at that time. Pt took his protein premier, more gram crackers and peanut butter. When CBG was later rechecked, it came up to 127 mg/dl. Currently, pt states, "I'm good now". Denies any discomfort at the moment, able to make needs known. Will continue monitoring

## 2017-06-21 ENCOUNTER — Inpatient Hospital Stay (HOSPITAL_COMMUNITY): Payer: Self-pay | Admitting: Physical Therapy

## 2017-06-21 ENCOUNTER — Inpatient Hospital Stay (HOSPITAL_COMMUNITY): Payer: Self-pay | Admitting: Occupational Therapy

## 2017-06-21 ENCOUNTER — Inpatient Hospital Stay (HOSPITAL_COMMUNITY): Payer: Self-pay

## 2017-06-21 DIAGNOSIS — E1040 Type 1 diabetes mellitus with diabetic neuropathy, unspecified: Secondary | ICD-10-CM | POA: Diagnosis not present

## 2017-06-21 DIAGNOSIS — E875 Hyperkalemia: Secondary | ICD-10-CM | POA: Diagnosis not present

## 2017-06-21 DIAGNOSIS — N179 Acute kidney failure, unspecified: Secondary | ICD-10-CM | POA: Diagnosis not present

## 2017-06-21 LAB — GLUCOSE, CAPILLARY
GLUCOSE-CAPILLARY: 249 mg/dL — AB (ref 65–99)
GLUCOSE-CAPILLARY: 157 mg/dL — AB (ref 65–99)
GLUCOSE-CAPILLARY: 112 mg/dL — AB (ref 65–99)
Glucose-Capillary: 281 mg/dL — ABNORMAL HIGH (ref 65–99)
Glucose-Capillary: 138 mg/dL — ABNORMAL HIGH (ref 65–99)

## 2017-06-21 MED ORDER — POLYETHYLENE GLYCOL 3350 17 G PO PACK: 17.0000 g | PACK | Freq: Two times a day (BID) | ORAL | 0 refills | Status: AC

## 2017-06-21 MED ORDER — INSULIN ASPART PROT & ASPART (70-30 MIX) 100 UNIT/ML ~~LOC~~ SUSP: 20.0000 [IU] | Freq: Every day | SUBCUTANEOUS | 11 refills | Status: DC

## 2017-06-21 MED ORDER — INSULIN ASPART PROT & ASPART (70-30 MIX) 100 UNIT/ML ~~LOC~~ SUSP: 15.0000 [IU] | Freq: Every day | SUBCUTANEOUS | 11 refills | Status: DC

## 2017-06-21 MED ORDER — POLYETHYLENE GLYCOL 3350 17 G PO PACK
17.0000 g | PACK | Freq: Two times a day (BID) | ORAL | Status: DC
  Administered 2017-06-21 – 2017-06-23 (×4): 17 g via ORAL
  Filled 2017-06-21 (×5): qty 1

## 2017-06-21 MED ORDER — CYCLOBENZAPRINE HCL 5 MG PO TABS: 5.0000 mg | ORAL_TABLET | Freq: Three times a day (TID) | ORAL | 0 refills | Status: DC | PRN

## 2017-06-21 MED ORDER — INSULIN ASPART PROT & ASPART (70-30 MIX) 100 UNIT/ML ~~LOC~~ SUSP
15.0000 [IU] | Freq: Every day | SUBCUTANEOUS | Status: DC
  Administered 2017-06-21: 15 [IU] via SUBCUTANEOUS

## 2017-06-21 MED ORDER — MAGNESIUM HYDROXIDE NICU ORAL SYRINGE 400 MG/5 ML: 30.0000 mL | Freq: Every day | ORAL | Status: DC | PRN

## 2017-06-21 MED ORDER — POLYETHYLENE GLYCOL 3350 17 G PO PACK: 17.0000 g | PACK | Freq: Every day | ORAL | 0 refills | Status: DC

## 2017-06-21 NOTE — Progress Notes (Signed)
Social Work Patient ID: Matthew Riley, male   DOB: Feb 27, 1976, 42 y.o.   MRN: [id number]   Met with patient following team conference.  He is aware and agreeable with targeted discharge date of 06/23/17 and modified independent goals.  Have arranged home health and DME for discharge.  Jhanae Jaskowiak, LCSW

## 2017-06-21 NOTE — Progress Notes (Signed)
Occupational Therapy Session Note  Patient Details  Name: Linus Weckerly MRN: [id number] Date of Birth: Jun 20, 1975  Today's Date: 06/21/2017 OT Individual Time: 7915-0569 OT Individual Time Calculation (min): 59 min    Short Term Goals: Week 2:  OT Short Term Goal 1 (Week 2): STGs = LTGs  Skilled Therapeutic Interventions/Progress Updates:    OT intervention with focus on bathing at shower level and dressing with sit<>stand from EOB.  Pt amb with RW in room to gather supplies prior to transferring to shower seat.  Pt completed bathing tasks with sit<>stand.  Pt amb with RW back to room to complete dressing tasks.  Pt completed bathing/dressing tasks with distant supervision.  Pt assisted with removing bed linens and placing in linen bag.  Pt amb with RW and engaged in dynamic standing activities with focus on safety and activity tolerance.  Pt returned to room and remained seated EOB with all needs within reach.   Therapy Documentation Precautions:  Precautions Precautions: Fall Restrictions Weight Bearing Restrictions: No  Pain:  Pt denies pain    See Function Navigator for Current Functional Status.   Therapy/Group: Individual Therapy  Rich Brave 06/21/2017, 10:02 AM

## 2017-06-21 NOTE — Progress Notes (Signed)
PHYSICAL MEDICINE & REHABILITATION     PROGRESS NOTE  Subjective/Complaints:  Patient seen lying in bed this morning. He states he slept well overnight. He still notes labile CBGs. He has questions about his nighttime snack.  ROS:  Denies CP, SOB, N/V/D.  Objective: Vital Signs: Blood pressure 105/73, pulse 90, temperature (!) 97.5 F (36.4 C), temperature source Axillary, resp. rate 15, height 6\' 2"  (1.88 m), weight 90.7 kg (200 lb), SpO2 97 %. No results found. No results for input(s): WBC, HGB, HCT, PLT in the last 72 hours. Recent Labs    06/18/17 1608 06/19/17 0810  NA  --  130*  K  --  4.6  CL  --  99*  GLUCOSE 362* 194*  BUN  --  32*  CREATININE  --  1.02  CALCIUM  --  8.8*   CBG (last 3)  Recent Labs    06/20/17 2337 06/21/17 0014 06/21/17 0627  GLUCAP 30* 138* 281*    Wt Readings from Last 3 Encounters:  06/21/17 90.7 kg (200 lb)  06/11/17 92.6 kg (204 lb 2.3 oz)  05/16/17 93.7 kg (206 lb 9.6 oz)    Physical Exam:  BP 105/73 (BP Location: Right Arm)   Pulse 90   Temp (!) 97.5 F (36.4 C) (Axillary)   Resp 15   Ht 6\' 2"  (1.88 m)   Wt 90.7 kg (200 lb)   SpO2 97%   BMI 25.68 kg/m  Constitutional: He appears well-developed and well-nourished. No distress.  HENT: Normocephalic and atraumatic.  Eyes: EOM are normal. No discharge.  Cardiovascular: RRR. No JVD. Respiratory: Effort normal and breath sounds normal.  GI: Bowel sounds are normal. He exhibits no distension. Musculoskeletal: He exhibits no edema, no tenderness.  Neurological: He is alert and oriented.  Able to follow simple one and two step commands without difficulty. Motor: 4+/5 throughout (improving) Skin: Skin is warm and dry. He is not diaphoretic.  Psychiatric: Flat.  Assessment/Plan: 1. Functional deficits secondary to debility which require 3+ hours per day of interdisciplinary therapy in a comprehensive inpatient rehab setting. Physiatrist is providing close team  supervision and 24 hour management of active medical problems listed below. Physiatrist and rehab team continue to assess barriers to discharge/monitor patient progress toward functional and medical goals.  Function:  Bathing Bathing position   Position: Shower  Bathing parts Body parts bathed by patient: Right arm, Left arm, Chest, Abdomen, Front perineal area, Buttocks, Right upper leg, Left upper leg, Right lower leg, Left lower leg, Back Body parts bathed by helper: Back  Bathing assist Assist Level: Set up      Upper Body Dressing/Undressing Upper body dressing   What is the patient wearing?: Pull over shirt/dress     Pull over shirt/dress - Perfomed by patient: Thread/unthread right sleeve, Thread/unthread left sleeve, Put head through opening, Pull shirt over trunk          Upper body assist Assist Level: Set up      Lower Body Dressing/Undressing Lower body dressing   What is the patient wearing?: Pants, Shoes, Non-skid slipper socks     Pants- Performed by patient: Thread/unthread right pants leg, Thread/unthread left pants leg, Pull pants up/down   Non-skid slipper socks- Performed by patient: Don/doff right sock, Don/doff left sock       Shoes - Performed by patient: Don/doff right shoe, Don/doff left shoe, Fasten right, Fasten left         TED Hose - Performed by  helper: Don/doff right TED hose, Don/doff left TED hose  Lower body assist Assist for lower body dressing: Supervision or verbal cues      Toileting Toileting   Toileting steps completed by patient: Adjust clothing prior to toileting, Performs perineal hygiene, Adjust clothing after toileting   Toileting Assistive Devices: Grab bar or rail  Toileting assist Assist level: Supervision or verbal cues   Transfers Chair/bed transfer Chair/bed transfer activity did not occur: N/A Chair/bed transfer method: Stand pivot, Ambulatory Chair/bed transfer assist level: Supervision or verbal  cues Chair/bed transfer assistive device: Patent attorney     Max distance: 150 Assist level: Supervision or verbal cues   Wheelchair     Max wheelchair distance: 158ft  Assist Level: Supervision or verbal cues  Cognition Comprehension Comprehension assist level: Follows complex conversation/direction with extra time/assistive device  Expression Expression assist level: Expresses complex ideas: With extra time/assistive device  Social Interaction Social Interaction assist level: Interacts appropriately with others - No medications needed.  Problem Solving Problem solving assist level: Solves complex problems: With extra time  Memory Memory assist level: Complete Independence: No helper    Medical Problem List and Plan: 1.  Deficits with mobility and ability to complete ADLs secondary to debility.   Cont CIR 2.  DVT Prophylaxis/Anticoagulation: Pharmaceutical: Lovenox 3. Pain Management:continue Ultram prn.     Gabapentin for pain bilateral hands and BLE, increased to 300 3 times a day on 1/12.   Controlled on 1/18 4. Mood: LCSW to follow for evaluation and support.  5. Neuropsych: This patient is capable of making decisions on his own behalf. 6. Skin/Wound Care: Maintain adequate nutritional  and hydration status.  7. Fluids/Electrolytes/Nutrition: Monitor I/O--strict.   8. Sepsis due to aspiration PNA/strep viridans bacteremia:    Ceftriaxone and Doxy d/ced on 1/14 9. Leucocytosis: Resolved   Continue to monitor for signs of infection.    PNA resolving, CXR 1/10 reviewed, showing improving PNA   WBCs 9.9 on 1/14 10. CAD with acute systolic CHF: Monitor weights daily. Lasix on hold due to AKI.     Filed Weights   06/19/17 0428 06/20/17 0448 06/21/17 0500  Weight: 92.7 kg (204 lb 6.4 oz) 93 kg (205 lb 0.4 oz) 90.7 kg (200 lb)  11. BLE ulcers: Due to trauma--on doxycycline.    ABI's WNL. 12.  RA: Recent diagnosis with addition of methotrexate last month.     Avoid NSAIDs with AKI and recent MI.  Hold  Methotrexate.  13. T1DM with neuropathy: Used 70/30 insulin at home with [facility] regimen decreased to NovoLog 70/30 20 units with breakfast   Novolog 70/30 35 units with supper, decreased to 15 units on 1/18   SSI changed to sensitive   Extreme hypoglycemia of 30 overnight   Will add daily at bedtime snack 14. Abnormal LFTs/Recent electrolyte abnormalities: Due to GI issues and sepsis.    Remains elevated 1/14 15. Hyponatremia: Progressive drop noted. Started fluid restriction and monitor.    Na 130 on 1/16   Labs ordered for tomorrow   Cont to monitor 16. Sore throat/Cough: On Mucinex. Added cepacol lozenges also.  17. ?GERD   Added pepcid. 18. ABLA   Hb 10.7 on 1/14   Cont to monitor  LOS (Days) 9 A FACE TO FACE EVALUATION WAS PERFORMED  Mychael Soots Karis Juba 06/21/2017 8:58 AM

## 2017-06-21 NOTE — Progress Notes (Signed)
Occupational Therapy Session Note  Patient Details  Name: Melquisedec Guthmiller MRN: [id number] Date of Birth: 12/09/1975  Today's Date: 06/21/2017 OT Individual Time: 1500-1530 OT Individual Time Calculation (min): 30 min    Short Term Goals: Week 2:  OT Short Term Goal 1 (Week 2): STGs = LTGs  Skilled Therapeutic Interventions/Progress Updates:    OT treatment session focused on UB there-ex. Utilized 5lb weighted bar for sit<>stand with straight arm raise 15 x 2 sets and chest press. Level 4 blue thera-band for lat pulls, bicep curls, and triceps press 15 x 3 sets. 2lb weighted ball toss chest press and overhead pass. Pt ambulated back to room and left seated EOB with needs met.   Therapy Documentation Precautions:  Precautions Precautions: Fall Restrictions Weight Bearing Restrictions: No Pain: Pain Assessment Pain Assessment: No/denies pain ADL: ADL ADL Comments: Please see functional navigator for ADL status   See Function Navigator for Current Functional Status.   Therapy/Group: Individual Therapy   S  06/21/2017, 3:35 PM 

## 2017-06-21 NOTE — Progress Notes (Signed)
Physical Therapy Session Note  Patient Details  Name: Matthew Riley MRN: [id number] Date of Birth: 17-Apr-1976  Today's Date: 06/21/2017 PT Individual Time: 1000-1055 PT Individual Time Calculation (min): 55 min   Short Term Goals: Week 2:  PT Short Term Goal 1 (Week 2): =LTG  Skilled Therapeutic Interventions/Progress Updates: Pt received seated in bed, denies pain and agreeable to treatment. Gait to gym with RW and min guard faded to close S. Ascent/descent 6" steps x12 stairs total with min guard/close S, slow speed. Performed floor transfer with S. In tall kneeling, engaged in connect four game while performing side steps R/L, partial squat for glute activation. Agility ladder performed with forward steps one foot per square, side steps 2 feet per square. Gait 4x 20' straddled over 2x4 beam to facilitate increasing BOS, strengthening hip abduction and pelvic stabilization in stance; minA overall. Gait weaving around cones with focus on reducing scissoring/crossing LEs; minA. Sitting balance and UE/core strengthening with weighted bar ball hits; progressed to semi-reclined situps with ball hit. Quadruped alternating UE extensions, alternating LE extensions, 2x10 each LE/UE. Gait to return to room no AD; minA overall, modA as pt fatigued and when in room patient impulsively reaching for sink to steady himself. Remained seated on EOB, all needs in reach. RN alerted to pt discomfort at IV site.      Therapy Documentation Precautions:  Precautions Precautions: Fall Restrictions Weight Bearing Restrictions: No General:    See Function Navigator for Current Functional Status.   Therapy/Group: Individual Therapy  [name] 06/21/2017, 10:59 AM

## 2017-06-22 ENCOUNTER — Inpatient Hospital Stay (HOSPITAL_COMMUNITY): Payer: Self-pay | Admitting: Occupational Therapy

## 2017-06-22 ENCOUNTER — Inpatient Hospital Stay (HOSPITAL_COMMUNITY): Payer: Self-pay | Admitting: Physical Therapy

## 2017-06-22 LAB — SODIUM, URINE, RANDOM: SODIUM UR: 47 mmol/L

## 2017-06-22 LAB — GLUCOSE, CAPILLARY
GLUCOSE-CAPILLARY: 214 mg/dL — AB (ref 65–99)
Glucose-Capillary: 231 mg/dL — ABNORMAL HIGH (ref 65–99)
Glucose-Capillary: 231 mg/dL — ABNORMAL HIGH (ref 65–99)
Glucose-Capillary: 184 mg/dL — ABNORMAL HIGH (ref 65–99)

## 2017-06-22 LAB — BASIC METABOLIC PANEL
Anion gap: 8 (ref 5–15)
BUN: 49 mg/dL — AB (ref 6–20)
CHLORIDE: 98 mmol/L — AB (ref 101–111)
CO2: 22 mmol/L (ref 22–32)
CREATININE: 1.05 mg/dL (ref 0.61–1.24)
Calcium: 8.9 mg/dL (ref 8.9–10.3)
GFR calc non Af Amer: >60 mL/min (ref >60)
GLUCOSE: 234 mg/dL — AB (ref 65–99)
Potassium: 4.8 mmol/L (ref 3.5–5.1)
Sodium: 128 mmol/L — ABNORMAL LOW (ref 135–145)

## 2017-06-22 MED ORDER — SODIUM CHLORIDE 0.9 % IV SOLN
INTRAVENOUS | Status: DC
  Administered 2017-06-22: 14:00:00 via INTRAVENOUS

## 2017-06-22 MED ORDER — INSULIN ASPART PROT & ASPART (70-30 MIX) 100 UNIT/ML ~~LOC~~ SUSP
20.0000 [IU] | Freq: Every day | SUBCUTANEOUS | Status: DC
  Administered 2017-06-22: 20 [IU] via SUBCUTANEOUS
  Filled 2017-06-22: qty 10

## 2017-06-22 NOTE — Progress Notes (Signed)
Occupational Therapy Discharge Summary  Patient Details  Name: Matthew Riley MRN: [id number] Date of Birth: 07-31-1975   Patient has met 11 of 11 long term goals due to improved activity tolerance, improved balance, postural control, ability to compensate for deficits. Patient to discharge at overall Modified Independent level.    Reasons goals not met: n/a  Recommendation:  Patient will benefit from ongoing skilled OT services in home health setting to continue to advance functional skills in the area of BADL.  Equipment: tub transfer bench  Reasons for discharge: treatment goals met and discharge from hospital  Patient/family agrees with progress made and goals achieved: Yes  OT Discharge Precautions/Restrictions  Precautions Precautions: Fall Restrictions Weight Bearing Restrictions: No ADL ADL Eating: Independent Grooming: Modified independent Upper Body Bathing: Modified independent Lower Body Bathing: Modified independent Upper Body Dressing: Modified independent (Device) Lower Body Dressing: Modified independent Toileting: Modified independent Toilet Transfer: Modified independent Tub/Shower Transfer: Modified independent ADL Comments: Please see functional navigator Perception  Perception: Within Functional Limits Praxis Praxis: Intact Cognition Overall Cognitive Status: Within Functional Limits for tasks assessed Arousal/Alertness: Awake/alert Orientation Level: Oriented X4 Attention: Selective Selective Attention: Appears intact Memory: Appears intact Awareness: Appears intact Problem Solving: Appears intact Safety/Judgment: Appears intact Comments: Pt demonstrates good safety awareness and understanding of current deficits  Sensation Coordination Gross Motor Movements are Fluid and Coordinated: Yes Fine Motor Movements are Fluid and Coordinated: Yes Coordination and Movement Description: increased smoothness Motor  Motor Motor:  Ataxia;Hemiplegia Motor - Discharge Observations: improved ataxia Mobility  Transfers Sit to Stand: 6: Modified independent (Device/Increase time) Stand to Sit: 6: Modified independent (Device/Increase time)  Trunk/Postural Assessment  Cervical Assessment Cervical Assessment: Exceptions to WFL(forward head) Thoracic Assessment Thoracic Assessment: Exceptions to WFL(rounded shoulders ) Lumbar Assessment Lumbar Assessment: Within Functional Limits  Balance Balance Balance Assessed: Yes Static Sitting Balance Static Sitting - Balance Support: Feet unsupported Static Sitting - Level of Assistance: 6: Modified independent (Device/Increase time) Dynamic Sitting Balance Dynamic Sitting - Balance Support: During functional activity Dynamic Sitting - Level of Assistance: 6: Modified independent (Device/Increase time) Static Standing Balance Static Standing - Balance Support: During functional activity Static Standing - Level of Assistance: 6: Modified independent (Device/Increase time) Dynamic Standing Balance Dynamic Standing - Balance Support: During functional activity Dynamic Standing - Level of Assistance: 6: Modified independent (Device/Increase time) Extremity/Trunk Assessment RUE Assessment RUE Assessment: Within Functional Limits LUE Assessment LUE Assessment: Within Functional Limits   See Function Navigator for Current Functional Status.  Daneen Schick Ugochukwu Chichester 06/22/2017, 12:51 PM

## 2017-06-22 NOTE — Progress Notes (Signed)
Physical Therapy Session Note  Patient Details  Name: Marlin Jarrard MRN: [id number] Date of Birth: 1975-12-05  Today's Date: 06/21/2017 PT Individual Time: 1415-1500 45 min    Short Term Goals: Week 1:  PT Short Term Goal 1 (Week 1): Pt will transfer to [facility] with supervision assist  PT Short Term Goal 1 - Progress (Week 1): Met PT Short Term Goal 2 (Week 1): Pt will ambulate 178f with Supervision assist PT Short Term Goal 2 - Progress (Week 1): Met PT Short Term Goal 3 (Week 1): Pt will ascend 12 steps with min assist  PT Short Term Goal 3 - Progress (Week 1): Met Week 2:  PT Short Term Goal 1 (Week 2): =LTG  Skilled Therapeutic Interventions/Progress Updates:   Pt received sitting EOB and agreeable to PT.   [facility] over unlevel surface with RW and supervision assist 3 x 1592f PT also instructed pt in gait without AD 3 x 15043fnd min assist for safety. Min cues for gait pattern and weight shifting without AD to improve safety and stability.   Pt [facility] without AD x 5 with close supervision assist from low mat table. Gait training in hall on level surface with RW and supervision assist x [id number] Pt left sitting on mat table to OT treatment.       Therapy Documentation Precautions:  Precautions Precautions: Fall Restrictions Weight Bearing Restrictions: No Vital Signs: Therapy Vitals Temp: 97.7 F (36.5 C) Temp Source: Oral Pulse Rate: 100 Resp: 12 BP: (!) 114/91 Patient Position (if appropriate): Lying Oxygen Therapy SpO2: 100 % O2 Device: Not Delivered Pain: 0/10  See Function Navigator for Current Functional Status.   Therapy/Group: Individual Therapy  AustLorie Phenix9/2019, 6:12 AM

## 2017-06-22 NOTE — Progress Notes (Signed)
Physical Therapy Discharge Summary  Patient Details  Name: Matthew Riley MRN: [id number] Date of Birth: 04-14-1976  Today's Date: 06/22/2017 PT Individual Time: 1500-1600 PT Individual Time Calculation (min): 60 min    Patient has met 9 of 9 long term goals due to improved activity tolerance, improved balance, improved postural control, increased strength, increased range of motion, ability to compensate for deficits and functional use of  right lower extremity and left lower extremity.  Patient to discharge at an ambulatory level Modified Independent.   Patient's care partner is independent to provide the necessary physical assistance at discharge.  Reasons goals not met: all PT goals met   Recommendation:  Patient will benefit from ongoing skilled PT services in home health setting to continue to advance safe functional mobility, address ongoing impairments in balance, coordination, gait, stair management, and minimize fall risk.  Equipment: No equipment provided  Reasons for discharge: treatment goals met and discharge from hospital  Patient/family agrees with progress made and goals achieved: Yes   PT treatment:  Pt received sitting in WC and agreeable to PT. PT instructed pt in Grad day assessment to measure progress toward goals. See below for details. PT isntructed pt in modified [place] level A. Min cues for technique and progressing at home. Pt received sitting in WC and agreeable to PT    PT Discharge Vital Signs Therapy Vitals Temp: 98.3 F (36.8 C) Temp Source: Oral Pulse Rate: (!) 106 Resp: 15 BP: 124/70 Patient Position (if appropriate): Lying Oxygen Therapy SpO2: 100 % O2 Device: Not Delivered Pain   0/10  Vision/Perception  Perception Perception: Within Functional Limits Praxis Praxis: Intact  Cognition Overall Cognitive Status: Within Functional Limits for tasks assessed Arousal/Alertness: Awake/alert Orientation Level: Oriented X4 Attention:  Selective Selective Attention: Appears intact Memory: Appears intact Awareness: Appears intact Problem Solving: Appears intact Safety/Judgment: Appears intact Comments: Pt demonstrates good safety awareness and understanding of current deficits  Sensation Sensation Light Touch: Impaired by gross assessment Proprioception: Impaired by gross assessment Additional Comments: incosistent appreciation of light touch in the distal  RLE.  Coordination Gross Motor Movements are Fluid and Coordinated: No Fine Motor Movements are Fluid and Coordinated: Yes Heel Shin Test: mild dyskinesia in the LLE.  Motor  Motor Motor: Ataxia;Hemiplegia Motor - Discharge Observations: improved ataxia  Mobility Bed Mobility Bed Mobility: Rolling Right;Rolling Left;Supine to Sit;Sit to Supine Rolling Right: 6: Modified independent (Device/Increase time) Rolling Left: 6: Modified independent (Device/Increase time) Supine to Sit: 6: Modified independent (Device/Increase time) Sit to Supine: 6: Modified independent (Device/Increase time) Transfers Transfers: Yes Sit to Stand: 6: Modified independent (Device/Increase time)(with RW) Stand to Sit: 6: Modified independent (Device/Increase time)(with RW) Stand Pivot Transfers: 6: Modified independent (Device/Increase time)(with RW)  Car trasnfer:5  supervisoin assist.  Picking object up from floor: 5 supervision assist  Locomotion  Ambulation Ambulation: Yes Ambulation/Gait Assistance: 6: Modified independent (Device/Increase time) Ambulation Distance (Feet): 150 Feet Assistive device: Rolling walker Gait Gait: Yes Gait Pattern: Impaired Gait Pattern: Ataxic Gait velocity: mild ataxia Stairs / Additional Locomotion Stairs: Yes Stairs Assistance: 6: Modified independent (Device/Increase time) Stair Management Technique: One rail Left Number of Stairs: 16 Ramp: 6: Modified independent (Device) Wheelchair Mobility Wheelchair Mobility: No  Trunk/Postural  Assessment  Cervical Assessment Cervical Assessment: Exceptions to WFL(forward head) Thoracic Assessment Thoracic Assessment: Exceptions to WFL(rounded shoulders ) Lumbar Assessment Lumbar Assessment: Within Functional Limits Postural Control Postural Control: Within Functional Limits  Balance Balance Balance Assessed: Yes Static Sitting Balance Static Sitting - Level of  Assistance: 6: Modified independent (Device/Increase time) Dynamic Sitting Balance Dynamic Sitting - Level of Assistance: 6: Modified independent (Device/Increase time) Static Standing Balance Static Standing - Level of Assistance: 6: Modified independent (Device/Increase time) Dynamic Standing Balance Dynamic Standing - Level of Assistance: 6: Modified independent (Device/Increase time)(with 1 UE support ) Extremity Assessment      RLE Assessment RLE Assessment: Within Functional Limits RLE Strength RLE Overall Strength Comments: grossly 4+/5 at hip and ankle. 5/5 at knees LLE Assessment LLE Assessment: Within Functional Limits LLE Strength LLE Overall Strength Comments: 4+/5 grossly proximal to distal   See Function Navigator for Current Functional Status.  Lorie Phenix 06/22/2017, 3:45 PM

## 2017-06-22 NOTE — Progress Notes (Addendum)
[place] PHYSICAL MEDICINE & REHABILITATION     PROGRESS NOTE  Subjective/Complaints:  Patient in bed without complaints today.    ROS: pt denies nausea, vomiting, diarrhea, cough, shortness of breath or chest pain   Objective: Vital Signs: Blood pressure (!) 114/91, pulse 100, temperature 97.7 F (36.5 C), temperature source Oral, resp. rate 12, height 6\' 2"  (1.88 m), weight 91.3 kg (201 lb 3.2 oz), SpO2 100 %. No results found. No results for input(s): WBC, HGB, HCT, PLT in the last 72 hours. Recent Labs    06/22/17 0605  NA 128*  K 4.8  CL 98*  GLUCOSE 234*  BUN 49*  CREATININE 1.05  CALCIUM 8.9   CBG (last 3)  Recent Labs    06/21/17 1631 06/21/17 2132 06/22/17 0632  GLUCAP 157* 112* 231*    Wt Readings from Last 3 Encounters:  06/22/17 91.3 kg (201 lb 3.2 oz)  06/11/17 92.6 kg (204 lb 2.3 oz)  05/16/17 93.7 kg (206 lb 9.6 oz)    Physical Exam:  BP (!) 114/91 (BP Location: Right Arm)   Pulse 100   Temp 97.7 F (36.5 C) (Oral)   Resp 12   Ht 6\' 2"  (1.88 m)   Wt 91.3 kg (201 lb 3.2 oz)   SpO2 100%   BMI 25.83 kg/m  Constitutional: He appears well-developed and well-nourished. No distress.  HENT: Normocephalic and atraumatic.  Eyes: EOM are normal. No discharge.  Cardiovascular: RRR without murmur. No JVD  Respiratory: CTA Bilaterally without wheezes or rales. Normal effort  GI: Bowel sounds are normal. He exhibits no distension. Musculoskeletal: He exhibits no edema, no tenderness.  Neurological: He is alert and oriented.  Able to follow simple one and two step commands without difficulty. Motor: 4+/5 throughout (improving) Skin: Skin is warm and dry. He is not diaphoretic. Left groin lesions? Psychiatric: Flat.  Assessment/Plan: 1. Functional deficits secondary to debility which require 3+ hours per day of interdisciplinary therapy in a comprehensive inpatient rehab setting. Physiatrist is providing close team supervision and 24 hour management  of active medical problems listed below. Physiatrist and rehab team continue to assess barriers to discharge/monitor patient progress toward functional and medical goals.  Function:  Bathing Bathing position   Position: Shower  Bathing parts Body parts bathed by patient: Right arm, Left arm, Chest, Abdomen, Front perineal area, Buttocks, Right upper leg, Left upper leg, Right lower leg, Left lower leg, Back Body parts bathed by helper: Back  Bathing assist Assist Level: Set up      Upper Body Dressing/Undressing Upper body dressing   What is the patient wearing?: Pull over shirt/dress     Pull over shirt/dress - Perfomed by patient: Thread/unthread right sleeve, Thread/unthread left sleeve, Put head through opening, Pull shirt over trunk          Upper body assist Assist Level: Set up      Lower Body Dressing/Undressing Lower body dressing   What is the patient wearing?: Pants, Shoes, Non-skid slipper socks     Pants- Performed by patient: Thread/unthread right pants leg, Thread/unthread left pants leg, Pull pants up/down   Non-skid slipper socks- Performed by patient: Don/doff right sock, Don/doff left sock       Shoes - Performed by patient: Don/doff right shoe, Don/doff left shoe, Fasten right, Fasten left         TED Hose - Performed by helper: Don/doff right TED hose, Don/doff left TED hose  Lower body assist Assist for lower body  dressing: Supervision or verbal cues      Toileting Toileting   Toileting steps completed by patient: Adjust clothing prior to toileting, Performs perineal hygiene, Adjust clothing after toileting   Toileting Assistive Devices: Grab bar or rail  Toileting assist Assist level: Supervision or verbal cues   Transfers Chair/bed transfer Chair/bed transfer activity did not occur: N/A Chair/bed transfer method: Stand pivot Chair/bed transfer assist level: Supervision or verbal cues Chair/bed transfer assistive device: Scientific laboratory technician     Max distance: 123ft Assist level: Supervision or verbal cues   Wheelchair     Max wheelchair distance: 181ft  Assist Level: Supervision or verbal cues  Cognition Comprehension Comprehension assist level: Follows complex conversation/direction with extra time/assistive device  Expression Expression assist level: Expresses complex ideas: With extra time/assistive device  Social Interaction Social Interaction assist level: Interacts appropriately with others - No medications needed.  Problem Solving Problem solving assist level: Solves complex problems: With extra time  Memory Memory assist level: Complete Independence: No helper    Medical Problem List and Plan: 1.  Deficits with mobility and ability to complete ADLs secondary to debility.   Cont CIR 2.  DVT Prophylaxis/Anticoagulation: Pharmaceutical: Lovenox 3. Pain Management:continue Ultram prn.     Gabapentin for pain bilateral hands and BLE, increased to 300 3 times a day on 1/12.   Controlled on 1/18 4. Mood: LCSW to follow for evaluation and support.  5. Neuropsych: This patient is capable of making decisions on his own behalf. 6. Skin/Wound Care: Local care to groin for now.  Keep area clean and dry as possible.  7. Fluids/Electrolytes/Nutrition: Monitor I/O--strict.   8. Sepsis due to aspiration PNA/strep viridans bacteremia:    Ceftriaxone and Doxy d/ced on 1/14 9. Leucocytosis: Resolved   Continue to monitor for signs of infection.    PNA resolving, CXR 1/10 reviewed, showing improving PNA   WBCs 9.9 on 1/14 10. CAD with acute systolic CHF: Monitor weights daily. Lasix on hold due to AKI.     Filed Weights   06/20/17 0448 06/21/17 0500 06/22/17 0305  Weight: 93 kg (205 lb 0.4 oz) 90.7 kg (200 lb) 91.3 kg (201 lb 3.2 oz)  11. BLE ulcers: Due to trauma--on doxycycline.    ABI's WNL. 12.  RA: Recent diagnosis with addition of methotrexate last month.    Avoid NSAIDs with AKI and recent MI.   Hold  Methotrexate.  13. T1DM with neuropathy: Used 70/30 insulin at home with [facility] regimen decreased to NovoLog 70/30 20 units with breakfast   Novolog 70/30 35 units with supper, decreased to 15 units on 1/18   SSI changed to sensitive   Extreme hypoglycemia of 30 overnight 1/17--sugars high this morning   Bedtime snack added    -increase pm 70/30 to 20units tonight 14. Abnormal LFTs/Recent electrolyte abnormalities: Due to GI issues and sepsis.    Remains elevated 1/14 15. Hyponatremia:     Further dropped to 128 today. BUN up to 49 (volume depleted)   - dc sodium restriction in diet   -begin NS IVF   -check urine sodium today   -recheck bmet tomorrow     16. Sore throat/Cough: On Mucinex. Added cepacol lozenges also.  17. ?GERD   Added pepcid. 18. ABLA   Hb 10.7 on 1/14   Cont to monitor  LOS (Days) 10 A FACE TO FACE EVALUATION WAS PERFORMED  Emalia Witkop T 06/22/2017 9:08 AM

## 2017-06-22 NOTE — Progress Notes (Signed)
Occupational Therapy Session Note  Patient Details  Name: Matthew Riley MRN: [id number] Date of Birth: September 30, 1975  Today's Date: 06/22/2017 OT Individual Time: 1884-1660 OT Individual Time Calculation (min): 100 min    Short Term Goals: Week 2:  OT Short Term Goal 1 (Week 2): STGs = LTGs  Skilled Therapeutic Interventions/Progress Updates:    Pt greeted EOB, discussed d/c plan, home BADL management, and falls prevention. Pt donned shoes and TED hose with friction reducing device. Pt stood for toothbrushing task mod I, then ambulated to dayroom. OT provided pt with home fine motor program, thera-putty exercises sheet, and firm blue thera-putty. Went through exercises with pt with focus on translation, rotation, pinch and grip strength. Reviewed home exercise sheet for UB thera-band exercises. Pt then ambulated to therapy gym RW and Mod I. Pt brought into tall kneeling for card game. Pt tolerated tall kneeling for 15 mins x2. Pt ambulated back to room and left seated at EOB with lunch tray and needs met.   Therapy Documentation  See Function Navigator for Current Functional Status.   Therapy/Group: Individual Therapy  Valma Cava 06/22/2017, 12:37 PM

## 2017-06-23 LAB — GLUCOSE, CAPILLARY
GLUCOSE-CAPILLARY: 372 mg/dL — AB (ref 65–99)
GLUCOSE-CAPILLARY: 203 mg/dL — AB (ref 65–99)
Glucose-Capillary: 352 mg/dL — ABNORMAL HIGH (ref 65–99)
Glucose-Capillary: 124 mg/dL — ABNORMAL HIGH (ref 65–99)

## 2017-06-23 LAB — BASIC METABOLIC PANEL
Anion gap: 7 (ref 5–15)
Anion gap: 8 (ref 5–15)
BUN: 44 mg/dL — ABNORMAL HIGH (ref 6–20)
BUN: 42 mg/dL — AB (ref 6–20)
CALCIUM: 8.9 mg/dL (ref 8.9–10.3)
CHLORIDE: 100 mmol/L — AB (ref 101–111)
CO2: 21 mmol/L — AB (ref 22–32)
CO2: 22 mmol/L (ref 22–32)
CREATININE: 1.21 mg/dL (ref 0.61–1.24)
CREATININE: 1.11 mg/dL (ref 0.61–1.24)
Calcium: 8.7 mg/dL — ABNORMAL LOW (ref 8.9–10.3)
Chloride: 97 mmol/L — ABNORMAL LOW (ref 101–111)
GFR calc Af Amer: >60 mL/min (ref >60)
GFR calc Af Amer: >60 mL/min (ref >60)
GFR calc non Af Amer: >60 mL/min (ref >60)
GFR calc non Af Amer: >60 mL/min (ref >60)
GLUCOSE: 500 mg/dL — AB (ref 65–99)
GLUCOSE: 260 mg/dL — AB (ref 65–99)
Potassium: 5.8 mmol/L — ABNORMAL HIGH (ref 3.5–5.1)
Potassium: 4.6 mmol/L (ref 3.5–5.1)
Sodium: 125 mmol/L — ABNORMAL LOW (ref 135–145)
Sodium: 130 mmol/L — ABNORMAL LOW (ref 135–145)

## 2017-06-23 MED ORDER — SODIUM POLYSTYRENE SULFONATE PO POWD
30.0000 g | Freq: Once | ORAL | Status: AC
  Administered 2017-06-23: 30 g via ORAL
  Filled 2017-06-23: qty 30

## 2017-06-23 MED ORDER — SODIUM POLYSTYRENE SULFONATE 15 GM/60ML PO SUSP
30.0000 g | Freq: Once | ORAL | Status: DC
  Filled 2017-06-23: qty 120

## 2017-06-23 MED ORDER — INSULIN ASPART PROT & ASPART (70-30 MIX) 100 UNIT/ML ~~LOC~~ SUSP
25.0000 [IU] | Freq: Every day | SUBCUTANEOUS | Status: DC
  Administered 2017-06-23: 25 [IU] via SUBCUTANEOUS
  Filled 2017-06-23: qty 10

## 2017-06-23 NOTE — Progress Notes (Addendum)
[place] PHYSICAL MEDICINE & REHABILITATION     PROGRESS NOTE  Subjective/Complaints:  No new complaints.  Slept well last night.  Denies pain  ROS: pt denies nausea, vomiting, diarrhea, cough, shortness of breath or chest pain    Objective: Vital Signs: Blood pressure 124/89, pulse (!) 103, temperature 98 F (36.7 C), temperature source Oral, resp. rate 18, height 6\' 2"  (1.88 m), weight 92.5 kg (204 lb), SpO2 97 %. No results found. No results for input(s): WBC, HGB, HCT, PLT in the last 72 hours. Recent Labs    06/22/17 0605  NA 128*  K 4.8  CL 98*  GLUCOSE 234*  BUN 49*  CREATININE 1.05  CALCIUM 8.9   CBG (last 3)  Recent Labs    06/22/17 1653 06/22/17 2108 06/23/17 0633  GLUCAP 184* 214* 352*    Wt Readings from Last 3 Encounters:  06/23/17 92.5 kg (204 lb)  06/11/17 92.6 kg (204 lb 2.3 oz)  05/16/17 93.7 kg (206 lb 9.6 oz)    Physical Exam:  BP 124/89 (BP Location: Right Arm)   Pulse (!) 103   Temp 98 F (36.7 C) (Oral)   Resp 18   Ht 6\' 2"  (1.88 m)   Wt 92.5 kg (204 lb)   SpO2 97%   BMI 26.19 kg/m  Constitutional: He appears well-developed and well-nourished. No distress.  HENT: Normocephalic and atraumatic.  Eyes: EOM are normal. No discharge.  Cardiovascular: RRR without murmur. No JVD  Respiratory: CTA Bilaterally without wheezes or rales. Normal effort   GI: Bowel sounds are normal. He exhibits no distension. Musculoskeletal: He exhibits no edema, no tenderness.  Neurological: He is alert and oriented.  Able to follow simple one and two step commands without difficulty. Motor: 4+/5 throughout (improving) Skin: Skin is warm and dry. He is not diaphoretic. Left groin lesions? Psychiatric: Flat.  Assessment/Plan: 1. Functional deficits secondary to debility which require 3+ hours per day of interdisciplinary therapy in a comprehensive inpatient rehab setting. Physiatrist is providing close team supervision and 24 hour management of active  medical problems listed below. Physiatrist and rehab team continue to assess barriers to discharge/monitor patient progress toward functional and medical goals.  Function:  Bathing Bathing position   Position: Shower  Bathing parts Body parts bathed by patient: Right arm, Left arm, Chest, Abdomen, Front perineal area, Right upper leg, Buttocks, Left upper leg, Right lower leg, Left lower leg, Back Body parts bathed by helper: Back  Bathing assist Assist Level: No help, No cues      Upper Body Dressing/Undressing Upper body dressing   What is the patient wearing?: Pull over shirt/dress     Pull over shirt/dress - Perfomed by patient: Thread/unthread right sleeve, Thread/unthread left sleeve, Put head through opening, Pull shirt over trunk          Upper body assist Assist Level: No help, No cues      Lower Body Dressing/Undressing Lower body dressing   What is the patient wearing?: Pants, Underwear, Non-skid slipper socks Underwear - Performed by patient: Thread/unthread left underwear leg, Thread/unthread right underwear leg, Pull underwear up/down   Pants- Performed by patient: Thread/unthread right pants leg, Thread/unthread left pants leg, Pull pants up/down   Non-skid slipper socks- Performed by patient: Don/doff right sock, Don/doff left sock       Shoes - Performed by patient: Don/doff right shoe, Don/doff left shoe, Fasten right, Fasten left         TED Hose - Performed by  helper: Don/doff right TED hose, Don/doff left TED hose  Lower body assist Assist for lower body dressing: No Help, No cues      Toileting Toileting   Toileting steps completed by patient: Adjust clothing prior to toileting, Performs perineal hygiene, Adjust clothing after toileting   Toileting Assistive Devices: Grab bar or rail  Toileting assist Assist level: Touching or steadying assistance (Pt.75%)   Transfers Chair/bed transfer Chair/bed transfer activity did not occur:  N/A Chair/bed transfer method: Stand pivot Chair/bed transfer assist level: No Help, no cues, assistive device, takes more than a reasonable amount of time Chair/bed transfer assistive device: Walker, Designer, fashion/clothing     Max distance: 178ft  Assist level: No help, No cues, assistive device, takes more than a reasonable amount of time   Wheelchair Wheelchair activity did not occur: N/A   Max wheelchair distance: 176ft  Assist Level: Supervision or verbal cues  Cognition Comprehension Comprehension assist level: Follows complex conversation/direction with no assist  Expression Expression assist level: Expresses complex ideas: With no assist  Social Interaction Social Interaction assist level: Interacts appropriately with others - No medications needed.  Problem Solving Problem solving assist level: Solves complex problems: Recognizes & self-corrects  Memory Memory assist level: Complete Independence: No helper    Medical Problem List and Plan: 1.  Deficits with mobility and ability to complete ADLs secondary to debility.   Cont CIR 2.  DVT Prophylaxis/Anticoagulation: Pharmaceutical: Lovenox 3. Pain Management:continue Ultram prn.     Gabapentin for pain bilateral hands and BLE, increased to 300 3 times a day on 1/12.   Controlled on 1/18 4. Mood: LCSW to follow for evaluation and support.  5. Neuropsych: This patient is capable of making decisions on his own behalf. 6. Skin/Wound Care: Local care to groin for now.  Keep area clean and dry as possible.  7. Fluids/Electrolytes/Nutrition: Monitor I/O--strict.   8. Sepsis due to aspiration PNA/strep viridans bacteremia:    Ceftriaxone and Doxy d/ced on 1/14 9. Leucocytosis: Resolved   Continue to monitor for signs of infection.    PNA resolving, CXR 1/10 reviewed, showing improving PNA   WBCs 9.9 on 1/14 10. CAD with acute systolic CHF: Monitor weights daily. Lasix on hold due to AKI.     Filed Weights    06/21/17 0500 06/22/17 0305 06/23/17 0338  Weight: 90.7 kg (200 lb) 91.3 kg (201 lb 3.2 oz) 92.5 kg (204 lb)  11. BLE ulcers: Due to trauma--on doxycycline.    ABI's WNL. 12.  RA: Recent diagnosis with addition of methotrexate last month.    Avoid NSAIDs with AKI and recent MI.  Hold  Methotrexate.  13. T1DM with neuropathy: Used 70/30 insulin at home with [facility] regimen decreased to NovoLog 70/30 20 units with breakfast   Novolog 70/30 35 units with supper, decreased to 15 units on 1/18   SSI changed to sensitive   Sugars remain extremely labile-   Extreme hypoglycemia of 30 overnight 1/17--and p.m. 70/30 insulin was decreased.   However a.m. sugars were high yesterday morning and even higher today (352) despite insulin being increased to 20 units last night.     -increase pm 70/30 once again to 25units this evening   -Outpatient endocrine follow-up needed 14. Abnormal LFTs/Recent electrolyte abnormalities: Due to GI issues and sepsis.    Remains elevated 1/14 15. 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 BUN to 44 1/20, Cr sl  higher    -urine sodium sl elevated 47   - stopped sodium restriction in diet 1/19   -increase NS IVF to 75 cc/hr (watch for any signs of fluid overload)   -incr FR to 1200cc   -hyperkalemia likely in part due to high CBG. Kayexalate 30g x one today   -recheck bmet later today and again tomorrow     16. Sore throat/Cough: On Mucinex. Added cepacol lozenges also.  17. ?GERD   Added pepcid. 18. ABLA   Hb 10.7 on 1/14   Cont to monitor  LOS (Days) 11 A FACE TO FACE EVALUATION WAS PERFORMED  Matthew Riley T 06/23/2017 8:38 AM

## 2017-06-24 ENCOUNTER — Inpatient Hospital Stay (HOSPITAL_COMMUNITY): Payer: Self-pay | Admitting: Physical Therapy

## 2017-06-24 ENCOUNTER — Inpatient Hospital Stay (HOSPITAL_COMMUNITY): Payer: Medicaid Other

## 2017-06-24 DIAGNOSIS — E876 Hypokalemia: Secondary | ICD-10-CM

## 2017-06-24 LAB — BASIC METABOLIC PANEL
Anion gap: 7 (ref 5–15)
BUN: 31 mg/dL — ABNORMAL HIGH (ref 6–20)
CHLORIDE: 100 mmol/L — AB (ref 101–111)
CO2: 25 mmol/L (ref 22–32)
Calcium: 8.8 mg/dL — ABNORMAL LOW (ref 8.9–10.3)
Creatinine, Ser: 1 mg/dL (ref 0.61–1.24)
GFR calc Af Amer: >60 mL/min (ref >60)
GLUCOSE: 215 mg/dL — AB (ref 65–99)
POTASSIUM: 4.7 mmol/L (ref 3.5–5.1)
Sodium: 132 mmol/L — ABNORMAL LOW (ref 135–145)

## 2017-06-24 LAB — GLUCOSE, CAPILLARY
Glucose-Capillary: 153 mg/dL — ABNORMAL HIGH (ref 65–99)
Glucose-Capillary: 326 mg/dL — ABNORMAL HIGH (ref 65–99)

## 2017-06-24 MED ORDER — INSULIN ASPART PROT & ASPART (70-30 MIX) 100 UNIT/ML ~~LOC~~ SUSP: 25.0000 [IU] | Freq: Every day | SUBCUTANEOUS | 11 refills | Status: DC

## 2017-06-24 MED ORDER — GUAIFENESIN ER 600 MG PO TB12: 600.0000 mg | ORAL_TABLET | Freq: Two times a day (BID) | ORAL | 0 refills | Status: DC | PRN

## 2017-06-24 MED ORDER — INSULIN ASPART PROT & ASPART (70-30 MIX) 100 UNIT/ML ~~LOC~~ SUSP
25.0000 [IU] | Freq: Every day | SUBCUTANEOUS | Status: DC
  Filled 2017-06-24: qty 10

## 2017-06-24 MED FILL — ?CYCLOBENZAPRINE 5 MG TABLE: 5 | 30 days supply | Qty: 90 | Fill #0

## 2017-06-24 MED FILL — !NOVOLOG MIX 70/30 VIAL: 70-30/ML | 28 days supply | Qty: 10 | Fill #0

## 2017-06-24 NOTE — Discharge Instructions (Signed)
Inpatient Rehab Discharge Instructions  Matthew Riley Discharge date and time:  06/24/17  Activities/Precautions/ Functional Status: Activity: activity as tolerated Diet: cardiac diet and diabetic diet Wound Care: N/A   Functional status:  ___ No restrictions     ___ Walk up steps independently ___ 24/7 supervision/assistance   ___ Walk up steps with assistance _X__ Intermittent supervision/assistance  _X__ Bathe/dress independently ___ Walk with walker     ___ Bathe/dress with assistance ___ Walk Independently    ___ Shower independently ___ Walk with assistance    ___ Shower with assistance _X__ No alcohol     ___ Return to work/school ________    COMMUNITY REFERRALS UPON DISCHARGE:    Home Health:   PT                      Agency:  [facility] Phone: [phone]    Medical Equipment/Items Ordered:  Tub bench                                                      Agency/Supplier:  [facility] @ [phone]   Special Instructions: 1. Monitor blood sugars before meals and at bedtime. Follow up with primary care for adjustment of insulin.  2. Weigh yourself today and daily.     Heart Failure Heart failure is a condition in which the heart has trouble pumping blood because it has become weak or stiff. This means that the heart does not pump blood efficiently for the body to work well. For some people with heart failure, fluid may back up into the lungs and there may be swelling (edema) in the lower legs. Heart failure is usually a long-term (chronic) condition. It is important for you to take good care of yourself and follow the treatment plan from your health care provider. What are the causes? This condition is caused by some health problems, including:  High blood pressure (hypertension). Hypertension causes the heart muscle to work harder than normal. High blood pressure eventually causes the heart to become stiff and weak.  Coronary artery disease (CAD).  CAD is the buildup of cholesterol and fat (plaques) in the arteries of the heart.  Heart attack (myocardial infarction). Injured tissue, which is caused by the heart attack, does not contract as well and the heart's ability to pump blood is weakened.  Abnormal heart valves. When the heart valves do not open and close properly, the heart muscle must pump harder to keep the blood flowing.  Heart muscle disease (cardiomyopathy or myocarditis). Heart muscle disease is damage to the heart muscle from a variety of causes, such as drug or alcohol abuse, infections, or unknown causes. These can increase the risk of heart failure.  Lung disease. When the lungs do not work properly, the heart must work harder.  What increases the risk? Risk of heart failure increases as a person ages. This condition is also more likely to develop in people who:  Are overweight.  Are male.  Smoke or chew tobacco.  Abuse alcohol or illegal drugs.  Have taken medicines that can damage the heart, such as chemotherapy drugs.  Have diabetes. ? High blood sugar (glucose) is associated with high fat (lipid) levels in the blood. ? Diabetes can also damage tiny blood vessels that carry nutrients to the heart muscle.  Have abnormal heart rhythms.  Have thyroid problems.  Have low blood counts (anemia).  What are the signs or symptoms? Symptoms of this condition include:  Shortness of breath with activity, such as when climbing stairs.  Persistent cough.  Swelling of the feet, ankles, legs, or abdomen.  Unexplained weight gain.  Difficulty breathing when lying flat (orthopnea).  Waking from sleep because of the need to sit up and get more air.  Rapid heartbeat.  Fatigue and loss of energy.  Feeling light-headed, dizzy, or close to fainting.  Loss of appetite.  Nausea.  Increased urination during the night (nocturia).  Confusion.  How is this diagnosed? This condition is diagnosed based  on:  Medical history, symptoms, and a physical exam.  Diagnostic tests, which may include: ? Echocardiogram. ? Electrocardiogram (ECG). ? Chest X-ray. ? Blood tests. ? Exercise stress test. ? Radionuclide scans. ? Cardiac catheterization and angiogram.  How is this treated? Treatment for this condition is aimed at managing the symptoms of heart failure. Medicines, behavioral changes, or other treatments may be necessary to treat heart failure. Medicines These may include:  Angiotensin-converting enzyme (ACE) inhibitors. This type of medicine blocks the effects of a blood protein called angiotensin-converting enzyme. ACE inhibitors relax (dilate) the blood vessels and help to lower blood pressure.  Angiotensin receptor blockers (ARBs). This type of medicine blocks the actions of a blood protein called angiotensin. ARBs dilate the blood vessels and help to lower blood pressure.  Water pills (diuretics). Diuretics cause the kidneys to remove salt and water from the blood. The extra fluid is removed through urination, leaving a lower volume of blood that the heart has to pump.  Beta blockers. These improve heart muscle strength and they prevent the heart from beating too quickly.  Digoxin. This increases the force of the heartbeat.  Healthy behavior changes These may include:  Reaching and maintaining a healthy weight.  Stopping smoking or chewing tobacco.  Eating heart-healthy foods.  Limiting or avoiding alcohol.  Stopping use of street drugs (illegal drugs).  Physical activity.  Other treatments These may include:  Surgery to open blocked coronary arteries or repair damaged heart valves.  Placement of a biventricular pacemaker to improve heart muscle function (cardiac resynchronization therapy). This device paces both the right ventricle and left ventricle.  Placement of a device to treat serious abnormal heart rhythms (implantable cardioverter defibrillator, or  ICD).  Placement of a device to improve the pumping ability of the heart (left ventricular assist device, or LVAD).  Heart transplant. This can cure heart failure, and it is considered for certain patients who do not improve with other therapies.  Follow these instructions at home: Medicines  Take over-the-counter and prescription medicines only as told by your health care provider. Medicines are important in reducing the workload of your heart, slowing the progression of heart failure, and improving your symptoms. ? Do not stop taking your medicine unless your health care provider told you to do that. ? Do not skip any dose of medicine. ? Refill your prescriptions before you run out of medicine. You need your medicines every day. Eating and drinking   Eat heart-healthy foods. Talk with a dietitian to make an eating plan that is right for you. ? Choose foods that contain no trans fat and are low in saturated fat and cholesterol. Healthy choices include fresh or frozen fruits and vegetables, fish, lean meats, legumes, fat-free or low-fat dairy products, and whole-grain or high-fiber foods. ? Limit salt (sodium)  if directed by your health care provider. Sodium restriction may reduce symptoms of heart failure. Ask a dietitian to recommend heart-healthy seasonings. ? Use healthy cooking methods instead of frying. Healthy methods include roasting, grilling, broiling, baking, poaching, steaming, and stir-frying.  Limit your fluid intake if directed by your health care provider. Fluid restriction may reduce symptoms of heart failure. Lifestyle  Stop smoking or using chewing tobacco. Nicotine and tobacco can damage your heart and your blood vessels. Do not use nicotine gum or patches before talking to your health care provider.  Limit alcohol intake to no more than 1 drink per day for non-pregnant women and 2 drinks per day for men. One drink equals 12 oz of beer, 5 oz of wine, or 1 oz of hard  liquor. ? Drinking more than that is harmful to your heart. Tell your health care provider if you drink alcohol several times a week. ? Talk with your health care provider about whether any level of alcohol use is safe for you. ? If your heart has already been damaged by alcohol or you have severe heart failure, drinking alcohol should be stopped completely.  Stop use of illegal drugs.  Lose weight if directed by your health care provider. Weight loss may reduce symptoms of heart failure.  Do moderate physical activity if directed by your health care provider. People who are elderly and people with severe heart failure should consult with a health care provider for physical activity recommendations. Monitor important information  Weigh yourself every day. Keeping track of your weight daily helps you to notice excess fluid sooner. ? Weigh yourself every morning after you urinate and before you eat breakfast. ? Wear the same amount of clothing each time you weigh yourself. ? Record your daily weight. Provide your health care provider with your weight record.  Monitor and record your blood pressure as told by your health care provider.  Check your pulse as told by your health care provider. Dealing with extreme temperatures  If the weather is extremely hot: ? Avoid vigorous physical activity. ? Use air conditioning or fans or seek a cooler location. ? Avoid caffeine and alcohol. ? Wear loose-fitting, lightweight, and light-colored clothing.  If the weather is extremely cold: ? Avoid vigorous physical activity. ? Layer your clothes. ? Wear mittens or gloves, a hat, and a scarf when you go outside. ? Avoid alcohol. General instructions  Manage other health conditions such as hypertension, diabetes, thyroid disease, or abnormal heart rhythms as told by your health care provider.  Learn to manage stress. If you need help to do this, ask your health care provider.  Plan rest periods  when fatigued.  Get ongoing education and support as needed.  Participate in or seek rehabilitation as needed to maintain or improve independence and quality of life.  Stay up to date with immunizations. Keeping current on pneumococcal and influenza immunizations is especially important to prevent respiratory infections.  Keep all follow-up visits as told by your health care provider. This is important. Contact a health care provider if:  You have a rapid weight gain.  You have increasing shortness of breath that is unusual for you.  You are unable to participate in your usual physical activities.  You tire easily.  You cough more than normal, especially with physical activity.  You have any swelling or more swelling in areas such as your hands, feet, ankles, or abdomen.  You are unable to sleep because it is hard to breathe.  You feel like your heart is beating quickly (palpitations).  You become dizzy or light-headed when you stand up. Get help right away if:  You have difficulty breathing.  You notice or your family notices a change in your awareness, such as having trouble staying awake or having difficulty with concentration.  You have pain or discomfort in your chest.  You have an episode of fainting (syncope). This information is not intended to replace advice given to you by your health care provider. Make sure you discuss any questions you have with your health care provider. Document Released: 05/21/2005 Document Revised: 01/24/2016 Document Reviewed: 12/14/2015 Elsevier Interactive Patient Education  [employer].  All questions have been answered and I understand these instructions. I will adhere to these goals and the provided educational materials after my discharge from the hospital.  Patient/Caregiver Signature _______________________________ Date __________  Clinician Signature _______________________________________ Date __________  Please bring  this form and your medication list with you to all your follow-up doctor's appointments.

## 2017-06-24 NOTE — Progress Notes (Signed)
Patient alert and oriented x 4. Patient scheduled for Discharge today. Discharge Instructions provided by P. Love, PA. Patient Discharged to Home with Discharge Instructions and Personal Belongings. Patient transported via wheelchair to [facility] where Sister awaited in private vehicle.

## 2017-06-24 NOTE — Progress Notes (Addendum)
[facility]     PROGRESS NOTE  Subjective/Complaints:  Pt seen lying in bed this AM.  He slept well overnight.  He notes issues with his Na+ and K+ over the weekend.   ROS: Denies nausea, vomiting, diarrhea, shortness of breath or chest pain   Objective: Vital Signs: Blood pressure (!) 129/98, pulse (!) 102, temperature 97.7 F (36.5 C), temperature source Oral, resp. rate 18, height 6\' 2"  (1.88 m), weight 90.3 kg (199 lb), SpO2 97 %. No results found. No results for input(s): WBC, HGB, HCT, PLT in the last 72 hours. Recent Labs    06/23/17 1545 06/24/17 0703  NA 130* 132*  K 4.6 4.7  CL 100* 100*  GLUCOSE 260* 215*  BUN 42* 31*  CREATININE 1.11 1.00  CALCIUM 8.7* 8.8*   CBG (last 3)  Recent Labs    06/23/17 1635 06/23/17 2105 06/24/17 0629  GLUCAP 203* 124* 153*    Wt Readings from Last 3 Encounters:  06/24/17 90.3 kg (199 lb)  06/11/17 92.6 kg (204 lb 2.3 oz)  05/16/17 93.7 kg (206 lb 9.6 oz)    Physical Exam:  BP (!) 129/98 (BP Location: Left Arm)   Pulse (!) 102   Temp 97.7 F (36.5 C) (Oral)   Resp 18   Ht 6\' 2"  (1.88 m)   Wt 90.3 kg (199 lb)   SpO2 97%   BMI 25.55 kg/m  Constitutional: He appears well-developed and well-nourished. No distress.  HENT: Normocephalic and atraumatic.  Eyes: EOM are normal. No discharge.  Cardiovascular: RRR. No JVD  Respiratory: CTA Bilaterally. Normal effort   GI: Bowel sounds are normal. He exhibits no distension. Musculoskeletal: He exhibits no edema, no tenderness.  Neurological: He is alert and oriented.  Able to follow simple one and two step commands without difficulty. Motor: 4+/5 throughout (stable) Skin: Skin is warm and dry. He is not diaphoretic.  Psychiatric: Flat.  Assessment/Plan: 1. Functional deficits secondary to debility which require 3+ hours per day of interdisciplinary therapy in a comprehensive inpatient rehab setting. Physiatrist is providing close team  supervision and 24 hour management of active medical problems listed below. Physiatrist and rehab team continue to assess barriers to discharge/monitor patient progress toward functional and medical goals.  Function:  Bathing Bathing position   Position: Shower  Bathing parts Body parts bathed by patient: Right arm, Left arm, Chest, Abdomen, Front perineal area, Right upper leg, Buttocks, Left upper leg, Right lower leg, Left lower leg, Back Body parts bathed by helper: Back  Bathing assist Assist Level: No help, No cues      Upper Body Dressing/Undressing Upper body dressing   What is the patient wearing?: Pull over shirt/dress     Pull over shirt/dress - Perfomed by patient: Thread/unthread right sleeve, Thread/unthread left sleeve, Put head through opening, Pull shirt over trunk          Upper body assist Assist Level: No help, No cues      Lower Body Dressing/Undressing Lower body dressing   What is the patient wearing?: Pants, Underwear, Non-skid slipper socks Underwear - Performed by patient: Thread/unthread left underwear leg, Thread/unthread right underwear leg, Pull underwear up/down   Pants- Performed by patient: Thread/unthread right pants leg, Thread/unthread left pants leg, Pull pants up/down   Non-skid slipper socks- Performed by patient: Don/doff right sock, Don/doff left sock       Shoes - Performed by patient: Don/doff right shoe, Don/doff left shoe, Fasten right, 05/18/17  left         TED Hose - Performed by helper: Don/doff right TED hose, Don/doff left TED hose  Lower body assist Assist for lower body dressing: No Help, No cues      Toileting Toileting   Toileting steps completed by patient: Adjust clothing prior to toileting, Performs perineal hygiene, Adjust clothing after toileting   Toileting Assistive Devices: Grab bar or rail  Toileting assist Assist level: Touching or steadying assistance (Pt.75%)   Transfers Chair/bed transfer Chair/bed  transfer activity did not occur: N/A Chair/bed transfer method: Stand pivot Chair/bed transfer assist level: No Help, no cues, assistive device, takes more than a reasonable amount of time Chair/bed transfer assistive device: Walker, Designer, fashion/clothing     Max distance: 193ft  Assist level: No help, No cues, assistive device, takes more than a reasonable amount of time   Wheelchair Wheelchair activity did not occur: N/A   Max wheelchair distance: 176ft  Assist Level: Supervision or verbal cues  Cognition Comprehension Comprehension assist level: Follows complex conversation/direction with no assist  Expression Expression assist level: Expresses complex ideas: With no assist  Social Interaction Social Interaction assist level: Interacts appropriately with others - No medications needed.  Problem Solving Problem solving assist level: Solves complex problems: Recognizes & self-corrects  Memory Memory assist level: Complete Independence: No helper    Medical Problem List and Plan: 1.  Deficits with mobility and ability to complete ADLs secondary to debility.   Pt was scheduled to be d/ced yesterday, however, due to abnormal lab values d/c held.  Labs appear to have stabalized.  Will need close outpt follow up. D/c today pending CXR.   Will see patient for transitional care management in 1 week with labs 2.  DVT Prophylaxis/Anticoagulation: Pharmaceutical: Lovenox 3. Pain Management:continue Ultram prn.     Gabapentin for pain bilateral hands and BLE, increased to 300 3 times a day on 1/12.   Controlled on 1/21 4. Mood: LCSW to follow for evaluation and support.  5. Neuropsych: This patient is capable of making decisions on his own behalf. 6. Skin/Wound Care: Local care to groin for now.  Keep area clean and dry as possible.  7. Fluids/Electrolytes/Nutrition: Monitor I/O--strict.   8. Sepsis due to aspiration PNA/strep viridans bacteremia:    Ceftriaxone and Doxy d/ced  on 1/14 9. Leucocytosis: Resolved   Continue to monitor for signs of infection.    PNA resolving, CXR 1/10 reviewed, showing improving PNA 10. CAD with acute systolic CHF: Monitor weights daily. Lasix on hold due to AKI.     Filed Weights   06/22/17 0305 06/23/17 0338 06/24/17 0413  Weight: 91.3 kg (201 lb 3.2 oz) 92.5 kg (204 lb) 90.3 kg (199 lb)  11. BLE ulcers: Due to trauma--on doxycycline.    ABI's WNL. 12.  RA: Recent diagnosis with addition of methotrexate last month.    Avoid NSAIDs with AKI and recent MI.  Hold  Methotrexate.  13. T1DM with neuropathy: Used 70/30 insulin at home with [facility] regimen decreased to NovoLog 70/30 20 units with breakfast   Novolog 70/30 35 units with supper, decreased to 15 units on 1/18, increased to 25 units on 1/21   SSI changed to sensitive   Outpatient endocrine follow-up needed   Remains labile, but improved 14. Abnormal LFTs/Recent electrolyte abnormalities: Due to GI issues and sepsis.    Remains elevated 1/14 15. Hyponatremia:     Na 132 on  1/21   Stopped sodium restriction in diet 1/19   D/ced IVF    incr FR to 1200cc   Pt was on IVF yesterday, will order CXR to ensure not fluid overloaded 16. Sore throat/Cough: On Mucinex. Added cepacol lozenges also.  17. ?GERD   Added pepcid. 18. ABLA   Hb 10.7 on 1/14   Cont to monitor 19. Hyperkalemia: Resolved   Kayexalate 30g x one  On 1/21  LOS (Days) 12 A FACE TO FACE EVALUATION WAS PERFORMED  Matthew Riley Matthew Riley 06/24/2017 9:14 AM

## 2017-06-24 NOTE — Discharge Summary (Signed)
Physician Discharge Summary  Patient ID: Matthew Riley MRN: [id number] DOB/AGE: 09/22/1975 42 y.o.  Admit date: 06/12/2017 Discharge date: 06/24/2017  Discharge Diagnoses:  Principal Problem:   Debility Active Problems:   Hyponatremia   Acute systolic heart failure (HCC)   Labile blood glucose   Hypoglycemia   Acute blood loss anemia   PNA (pneumonia)   Discharged Condition: stable   Significant Diagnostic Studies:  Dg Chest 2 View  Result Date: 06/24/2017 CLINICAL DATA:  Cough EXAM: CHEST  2 VIEW COMPARISON:  06/13/2017 FINDINGS: Cardiac shadow is stable. Patchy bibasilar changes are again seen and stable given some difference in patient positioning. No new focal infiltrate or sizable effusion is seen. No acute bony abnormality is noted. IMPRESSION: Stable bibasilar changes similar to that seen on the prior exam. Electronically Signed   By: Alcide Clever M.D.   On: 06/24/2017 09:15   Dg Chest 2 View  Result Date: 06/13/2017 CLINICAL DATA:  42 year old male with history of midline chest pain and productive cough for the past few days. EXAM: CHEST  2 VIEW COMPARISON:  Chest x-ray 06/11/2017. FINDINGS: Overall, there is slightly improved aeration throughout the lungs bilaterally, however, there continues to be widespread interstitial and airspace disease throughout all aspects of the lungs bilaterally, compatible with residual multilobar bronchopneumonia. No pleural effusions. No pneumothorax. No evidence of pulmonary edema. Heart size is normal. Upper mediastinal contours are within normal limits. IMPRESSION: 1. Persistent but improving multilobar bronchopneumonia. Electronically Signed   By: Trudie Reed M.D.   On: 06/13/2017 08:32   Dg Chest 2 View  Result Date: 06/11/2017 CLINICAL DATA:  Weakness and headaches EXAM: CHEST  2 VIEW COMPARISON:  06/07/2017 FINDINGS: Endotracheal and NG tubes have been removed. Right jugular central venous catheter removed. Confluent extensive bilateral  airspace disease with a central distribution has improved but there is considerable persistent disease. No pneumothorax. No pleural effusion. IMPRESSION: Improved but persistent bilateral airspace disease. The patient is extubated. Electronically Signed   By: Jolaine Click M.D.   On: 06/11/2017 07:54     Ct Abdomen Pelvis W Contrast  Result Date: 06/07/2017 CLINICAL DATA:  Patient in DKA on 12/31, unable to wean off the ventilator follow up sepsis extremities edema.^176mL ISOVUE-300 IOPAMIDOL (ISOVUE-300) INJECTION 61% EXAM: CT ABDOMEN AND PELVIS WITH CONTRAST TECHNIQUE: Multidetector CT imaging of the abdomen and pelvis was performed using the standard protocol following bolus administration of intravenous contrast. CONTRAST:  ISOVUE-300 IOPAMIDOL (ISOVUE-300) INJECTION 61% COMPARISON:  CT chest, abdomen and pelvis dated 06/03/2017. FINDINGS: Lower chest: Persistent perihilar and bibasilar consolidations, perhaps mildly improved compared to the earlier chest CT. Hepatobiliary: No focal liver abnormality is seen. No gallstones, gallbladder wall thickening, or biliary dilatation. Pancreas: Unremarkable. No pancreatic ductal dilatation or surrounding inflammatory changes. Spleen: Normal in size without focal abnormality. Adrenals/Urinary Tract: Adrenal glands appear normal. Kidneys are unremarkable without mass, stone or hydronephrosis. Bladder is decompressed by Foley catheter. Stomach/Bowel: No dilated large or small bowel loops. NG tube in place. Rectal tube in place. Vascular/Lymphatic: No significant vascular findings are present. No enlarged abdominal or pelvic lymph nodes. Reproductive: Prostate is unremarkable. Other: Mild fluid stranding within the bilateral pericolic gutters and lower pelvis. No circumscribed fluid collection or abscess like collection identified. No free intraperitoneal air seen. Musculoskeletal: No acute or suspicious osseous finding. Ill-defined fluid/edema within the  subcutaneous soft tissues indicating anasarca. IMPRESSION: 1. Anasarca within the superficial soft tissues. Mild fluid stranding within the paracolic gutters and lower pelvis. No circumscribed fluid  collection or abscess like collection identified. 2. Persistent perihilar and bibasilar consolidations, incompletely imaged, perhaps mildly improved compared to the chest CT of 06/03/2017. 3. Remainder of the abdomen and pelvis CT is unremarkable. No bowel obstruction. No evidence of acute solid organ abnormality. Electronically Signed   By: Bary Richard M.D.   On: 06/07/2017 16:39    Labs:  Basic Metabolic Panel: Recent Labs  Lab 06/18/17 1608 06/19/17 0810 06/22/17 0605 06/23/17 0826 06/23/17 1545 06/24/17 0703  NA  --  130* 128* 125* 130* 132*  K  --  4.6 4.8 5.8* 4.6 4.7  CL  --  99* 98* 97* 100* 100*  CO2  --  25 22 21* 22 25  GLUCOSE 362* 194* 234* 500* 260* 215*  BUN  --  32* 49* 44* 42* 31*  CREATININE  --  1.02 1.05 1.21 1.11 1.00  CALCIUM  --  8.8* 8.9 8.9 8.7* 8.8*    CBC: CBC Latest Ref Rng & Units 06/17/2017 06/13/2017 06/12/2017  WBC 4.0 - 10.5 K/uL 9.9 17.1(H) 16.3(H)  Hemoglobin 13.0 - 17.0 g/dL 10.7(L) 10.5(L) 10.8(L)  Hematocrit 39.0 - 52.0 % 31.6(L) 31.2(L) 30.6(L)  Platelets 150 - 400 K/uL 451(H) 390 338    CBG: Recent Labs  Lab 06/23/17 1130 06/23/17 1635 06/23/17 2105 06/24/17 0629 06/24/17 1154  GLUCAP 372* 203* 124* 153* 326*    Brief HPI:   Matthew Riley a 42 y.o.malewith history of HTN, CKD stage III, chronic LBP, T1DM, CVA 02/2017, CAD s/p stenting with DKA 04/2017 who was admitted on 06/03/17 after found unresponsive at home with evidence of aspiration. UDS positive for THC and benzos. He was intubated due to decreased LOC and treated for sepsis with pressors and broad spectrum antibiotics. CT chest/abdomen showed diffuse bilateral PNA and one blood culture was positive for strep viridans. CT abdomen repeated showing anasarca but no abscess or  abnormality.  Hypotension treated with fluid boluses. Electrolyte abnormalities were resolving and antibiotics were narrowed to ceftriaxone on 01/8.  Therapy ongoing and CIR recommended due to functional deficits.    Hospital Course: Matthew Riley was admitted to rehab 06/12/2017 for inpatient therapies to consist of PT and OT at least three hours five days a week. Past admission physiatrist, therapy team and rehab RN have worked together to provide customized collaborative inpatient rehab. Blood pressures were monitored on bid basis and hypotension has resolved. He was noted to be hyponatremic at admission and was placed on fluid restriction with improvement. He did develop AKI therefore fluid restriction was discotninued and he was encouraged to increase fluid intake. BP medications have been held during his hospitalization due to low BP. His blood sugars have been labile and have fluctuated from 40's to 300 range. He was educated on CM/HH diet and double portions were added due to reports of inadequate portions. HS snack was added to help prevent hypoglycemia in evenings.  As endurance and activity tolerance has improved, insulin has slowly been titrated back to home dose.    Leucocytosis noted at admission has resolved and no signs of infection noted. Respiratory status is stable and he was encourage to continue IS qid. Follow up CXR showed improvement and ceftriaxone was discontinued on 1/14 after 2 weeks antibiotic course.  Protein supplement was added to help promote healing and for BS stabilization. BLE wounds have been healing well. His discharge was held by a day due to concerns of AKI with hyperkalemia and hyponatremia. Question accurate as labs done later that day  showed improvement in electrolyte abnormality. He was treated with IVF overnight with improvement and will need repeat check of lytes in next 5-7 days. He has made good gains during his rehab stay and is modified independent at discharge.  He will continue to receive follow up HHPT by [facility] after discharge.    Rehab course: During patient's stay in rehab weekly team conferences were held to monitor patient's progress, set goals and discuss barriers to discharge. At admission, patient required min assist with basic self care tasks and mod assist with mobility. Cognitive evaluation showed no cognitive deficits and he was tolerating regular diet without signs of dysphagia. He has had improvement in activity tolerance, balance, postural control, as well as ability to compensate for deficits. He is able to complete ADL tasks at modified independent level. He is modified independent for transfers and to ambulate    Disposition: 01-Home or Self Care  Diet: Carb Modified/Heart Healthy.   Special Instructions: 1. Check blood sugars qid and follow up with primary care for further of insulin.  2. Recheck BMET in 5-7 days for follow up on hyponatremia and AKI.    Discharge Instructions    Ambulatory referral to Physical Medicine Rehab   Complete by:  As directed    1-2 weeks transitional care appt     Allergies as of 06/24/2017   No Known Allergies     Medication List    STOP taking these medications   cefTRIAXone 40 MG/ML IVPB Commonly known as:  ROCEPHIN   doxycycline 100 MG tablet Commonly known as:  VIBRA-TABS   insulin glargine 100 UNIT/ML injection Commonly known as:  LANTUS   mupirocin ointment 2 % Commonly known as:  BACTROBAN   ondansetron 4 MG tablet Commonly known as:  ZOFRAN     TAKE these medications   aspirin EC 81 MG tablet Take 1 tablet (81 mg total) by mouth daily.   cyclobenzaprine 5 MG tablet Commonly known as:  FLEXERIL Take 1 tablet (5 mg total) by mouth 3 (three) times daily as needed for muscle spasms.   guaiFENesin 600 MG 12 hr tablet Commonly known as:  MUCINEX Take 1 tablet (600 mg total) by mouth 2 (two) times daily as needed. What changed:    when to take  this  reasons to take this   insulin aspart 100 UNIT/ML injection Commonly known as:  novoLOG Inject 5 Units into the skin 3 (three) times daily with meals.   insulin aspart protamine- aspart (70-30) 100 UNIT/ML injection Commonly known as:  NOVOLOG MIX 70/30 Inject 0.25 mLs (25 Units total) into the skin daily with breakfast.   insulin aspart protamine- aspart (70-30) 100 UNIT/ML injection Commonly known as:  NOVOLOG MIX 70/30 Inject 0.25 mLs (25 Units total) into the skin daily with supper.   pantoprazole 40 MG tablet Commonly known as:  PROTONIX Take 1 tablet (40 mg total) by mouth daily.   polyethylene glycol packet Commonly known as:  MIRALAX / GLYCOLAX Take 17 g by mouth 2 (two) times daily.   thiamine 100 MG tablet Take 1 tablet (100 mg total) by mouth daily.   traMADol 50 MG tablet Commonly known as:  ULTRAM Take 1 tablet (50 mg total) by mouth every 8 (eight) hours as needed for severe pain.      Follow-up Information    Marcello Fennel, MD Follow up.   Specialty:  Physical Medicine and Rehabilitation Why:  office will call you with follow up appointment  Contact information: [address] [place] [address] [phone]        Lizbeth Bark, FNP Follow up on 06/25/2017.   Specialty:  Family Medicine Why:  @ 3:15 pm (hospital follow up appt) Contact information: [address] [place] [address] [phone]        [facility] Follow up.   Specialty:  Radiology Contact information: [address] [id number] [place] [place] [address] [phone]          Signed: Jacquelynn Cree 06/25/2017, 2:51 PM

## 2017-06-25 ENCOUNTER — Ambulatory Visit: Payer: Medicaid Other | Attending: Family Medicine | Admitting: Family Medicine

## 2017-06-25 ENCOUNTER — Encounter: Payer: Self-pay | Admitting: Family Medicine

## 2017-06-25 VITALS — BP 134/88 | HR 105 | Temp 97.5°F | Resp 18 | Ht 73.0 in | Wt 206.0 lb

## 2017-06-25 DIAGNOSIS — Z7982 Long term (current) use of aspirin: Secondary | ICD-10-CM | POA: Diagnosis not present

## 2017-06-25 DIAGNOSIS — Z955 Presence of coronary angioplasty implant and graft: Secondary | ICD-10-CM | POA: Insufficient documentation

## 2017-06-25 DIAGNOSIS — Z8701 Personal history of pneumonia (recurrent): Secondary | ICD-10-CM | POA: Diagnosis not present

## 2017-06-25 DIAGNOSIS — I10 Essential (primary) hypertension: Secondary | ICD-10-CM | POA: Insufficient documentation

## 2017-06-25 DIAGNOSIS — E1042 Type 1 diabetes mellitus with diabetic polyneuropathy: Secondary | ICD-10-CM

## 2017-06-25 DIAGNOSIS — Z79899 Other long term (current) drug therapy: Secondary | ICD-10-CM | POA: Insufficient documentation

## 2017-06-25 DIAGNOSIS — I2510 Atherosclerotic heart disease of native coronary artery without angina pectoris: Secondary | ICD-10-CM | POA: Insufficient documentation

## 2017-06-25 DIAGNOSIS — Z09 Encounter for follow-up examination after completed treatment for conditions other than malignant neoplasm: Secondary | ICD-10-CM

## 2017-06-25 DIAGNOSIS — Z794 Long term (current) use of insulin: Secondary | ICD-10-CM | POA: Insufficient documentation

## 2017-06-25 DIAGNOSIS — K219 Gastro-esophageal reflux disease without esophagitis: Secondary | ICD-10-CM | POA: Insufficient documentation

## 2017-06-25 DIAGNOSIS — H538 Other visual disturbances: Secondary | ICD-10-CM | POA: Insufficient documentation

## 2017-06-25 DIAGNOSIS — E1065 Type 1 diabetes mellitus with hyperglycemia: Secondary | ICD-10-CM | POA: Diagnosis not present

## 2017-06-25 DIAGNOSIS — R9430 Abnormal result of cardiovascular function study, unspecified: Secondary | ICD-10-CM

## 2017-06-25 LAB — POCT GLYCOSYLATED HEMOGLOBIN (HGB A1C): Hemoglobin A1C: 9.6

## 2017-06-25 LAB — GLUCOSE, POCT (MANUAL RESULT ENTRY): POC Glucose: 296 mg/dl — AB (ref 70–99)

## 2017-06-25 MED ORDER — INSULIN SYRINGES (DISPOSABLE) U-100 1 ML MISC: 1.0000 | Freq: Once | 11 refills | Status: AC

## 2017-06-25 MED ORDER — ASPIRIN EC 81 MG PO TBEC: 81.0000 mg | DELAYED_RELEASE_TABLET | Freq: Every day | ORAL | 11 refills | Status: AC

## 2017-06-25 NOTE — Patient Instructions (Signed)
Check blood sugars four times a day . Bring glucometer and blood sugar log to next appt.   Diabetes Mellitus and Nutrition When you have diabetes (diabetes mellitus), it is very important to have healthy eating habits because your blood sugar (glucose) levels are greatly affected by what you eat and drink. Eating healthy foods in the appropriate amounts, at about the same times every day, can help you:  Control your blood glucose.  Lower your risk of heart disease.  Improve your blood pressure.  Reach or maintain a healthy weight.  Every person with diabetes is different, and each person has different needs for a meal plan. Your health care provider may recommend that you work with a diet and nutrition specialist (dietitian) to make a meal plan that is best for you. Your meal plan may vary depending on factors such as:  The calories you need.  The medicines you take.  Your weight.  Your blood glucose, blood pressure, and cholesterol levels.  Your activity level.  Other health conditions you have, such as heart or kidney disease.  How do carbohydrates affect me? Carbohydrates affect your blood glucose level more than any other type of food. Eating carbohydrates naturally increases the amount of glucose in your blood. Carbohydrate counting is a method for keeping track of how many carbohydrates you eat. Counting carbohydrates is important to keep your blood glucose at a healthy level, especially if you use insulin or take certain oral diabetes medicines. It is important to know how many carbohydrates you can safely have in each meal. This is different for every person. Your dietitian can help you calculate how many carbohydrates you should have at each meal and for snack. Foods that contain carbohydrates include:  Bread, cereal, rice, pasta, and crackers.  Potatoes and corn.  Peas, beans, and lentils.  Milk and yogurt.  Fruit and juice.  Desserts, such as cakes, cookies, ice  cream, and candy.  How does alcohol affect me? Alcohol can cause a sudden decrease in blood glucose (hypoglycemia), especially if you use insulin or take certain oral diabetes medicines. Hypoglycemia can be a life-threatening condition. Symptoms of hypoglycemia (sleepiness, dizziness, and confusion) are similar to symptoms of having too much alcohol. If your health care provider says that alcohol is safe for you, follow these guidelines:  Limit alcohol intake to no more than 1 drink per day for nonpregnant women and 2 drinks per day for men. One drink equals 12 oz of beer, 5 oz of wine, or 1 oz of hard liquor.  Do not drink on an empty stomach.  Keep yourself hydrated with water, diet soda, or unsweetened iced tea.  Keep in mind that regular soda, juice, and other mixers may contain a lot of sugar and must be counted as carbohydrates.  What are tips for following this plan? Reading food labels  Start by checking the serving size on the label. The amount of calories, carbohydrates, fats, and other nutrients listed on the label are based on one serving of the food. Many foods contain more than one serving per package.  Check the total grams (g) of carbohydrates in one serving. You can calculate the number of servings of carbohydrates in one serving by dividing the total carbohydrates by 15. For example, if a food has 30 g of total carbohydrates, it would be equal to 2 servings of carbohydrates.  Check the number of grams (g) of saturated and trans fats in one serving. Choose foods that have low or  no amount of these fats.  Check the number of milligrams (mg) of sodium in one serving. Most people should limit total sodium intake to less than 2,300 mg per day.  Always check the nutrition information of foods labeled as "low-fat" or "nonfat". These foods may be higher in added sugar or refined carbohydrates and should be avoided.  Talk to your dietitian to identify your daily goals for  nutrients listed on the label. Shopping  Avoid buying canned, premade, or processed foods. These foods tend to be high in fat, sodium, and added sugar.  Shop around the outside edge of the grocery store. This includes fresh fruits and vegetables, bulk grains, fresh meats, and fresh dairy. Cooking  Use low-heat cooking methods, such as baking, instead of high-heat cooking methods like deep frying.  Cook using healthy oils, such as olive, canola, or sunflower oil.  Avoid cooking with butter, cream, or high-fat meats. Meal planning  Eat meals and snacks regularly, preferably at the same times every day. Avoid going long periods of time without eating.  Eat foods high in fiber, such as fresh fruits, vegetables, beans, and whole grains. Talk to your dietitian about how many servings of carbohydrates you can eat at each meal.  Eat 4-6 ounces of lean protein each day, such as lean meat, chicken, fish, eggs, or tofu. 1 ounce is equal to 1 ounce of meat, chicken, or fish, 1 egg, or 1/4 cup of tofu.  Eat some foods each day that contain healthy fats, such as avocado, nuts, seeds, and fish. Lifestyle   Check your blood glucose regularly.  Exercise at least 30 minutes 5 or more days each week, or as told by your health care provider.  Take medicines as told by your health care provider.  Do not use any products that contain nicotine or tobacco, such as cigarettes and e-cigarettes. If you need help quitting, ask your health care provider.  Work with a Veterinary surgeon or diabetes educator to identify strategies to manage stress and any emotional and social challenges. What are some questions to ask my health care provider?  Do I need to meet with a diabetes educator?  Do I need to meet with a dietitian?  What number can I call if I have questions?  When are the best times to check my blood glucose? Where to find more information:  American Diabetes Association:  diabetes.org/food-and-fitness/food  Academy of Nutrition and Dietetics: https://www.vargas.com/  [employer] of Diabetes and Digestive and Kidney Diseases (NIH): FindJewelers.cz Summary  A healthy meal plan will help you control your blood glucose and maintain a healthy lifestyle.  Working with a diet and nutrition specialist (dietitian) can help you make a meal plan that is best for you.  Keep in mind that carbohydrates and alcohol have immediate effects on your blood glucose levels. It is important to count carbohydrates and to use alcohol carefully. This information is not intended to replace advice given to you by your health care provider. Make sure you discuss any questions you have with your health care provider. Document Released: 02/15/2005 Document Revised: 06/25/2016 Document Reviewed: 06/25/2016 Elsevier Interactive Patient Education  [employer].

## 2017-06-25 NOTE — Progress Notes (Signed)
Subjective:  Patient ID: Matthew Riley, male    DOB: 01/03/1976  Age: 42 y.o. MRN: [id number]  CC: Hospitalization Follow-up   HPI Matthew Riley  Is a 42 y.o male who presents for hospital follow up 06/03/17-06/12/2017. PMH of DM, HTN, CAD s/p stent placement, GERD, and tobacco abuse. He presented to the ED on 12/31 after he was found down in the bathroom at home unresponsive covered in emesis and diarrhea. In ED patient was hypotensive and hypoxic, requiring intubation. Lactic acid 5.07. Work up for sepsis. He was given IV vancomycin/zosyn and 3L NS fluid resuscitation. AST/ALT 324/233, Crt 2.34. He was admitted to Pulmonary Critical [facility]. CXR showed bilateral airspace opacities. CT ABD performed dense bilateral pneumonia noted, with no acute abnormality of abdomen or pelvis. CT chest showed findings associated with pneumonia. Blood cultures performed + for Strep viridans bacteremia diagnosed with aspiration PNA. [place] ABD showed no abnormalities of the liver or common bile duct, mild gallbladder wall with pericholecystic fluid seen no stones. History of hypoglycemia during hospitalization 70/30 insulin reduced from 30 units BID with meals to 25 units. History of leg wound antibiotics course of doxycycline given and wound consult. Echocardiogram showed decreased EF 45% new onset HF. History of coronary artery disease with s/p placement. Recommended f/u with cardiology. Admitted to inpatient rehab 06/12/2017-06/24/17. Last CXR 06/24/17 showed persistent but improving multilobar bronchopneumonia .Labs show improvement. BLE showed improvement. Plan to continue [facility] PT from [place]. He present today for follow up. He denies any chest pain, dyspnea,chills, or dysphagia.  He does report adherence with diabetic medications. Symptoms: Blurred vision. Onset was 1 year ago. Reports decreased visual acuity with reading small lettering.     Outpatient Medications Prior to Visit  Medication Sig Dispense  Refill  . cyclobenzaprine (FLEXERIL) 5 MG tablet Take 1 tablet (5 mg total) by mouth 3 (three) times daily as needed for muscle spasms. 90 tablet 0  . guaiFENesin (MUCINEX) 600 MG 12 hr tablet Take 1 tablet (600 mg total) by mouth 2 (two) times daily as needed. 30 tablet 0  . insulin aspart (NOVOLOG) 100 UNIT/ML injection Inject 5 Units into the skin 3 (three) times daily with meals. 10 mL 3  . insulin aspart protamine- aspart (NOVOLOG MIX 70/30) (70-30) 100 UNIT/ML injection Inject 0.25 mLs (25 Units total) into the skin daily with breakfast. 10 mL 11  . insulin aspart protamine- aspart (NOVOLOG MIX 70/30) (70-30) 100 UNIT/ML injection Inject 0.25 mLs (25 Units total) into the skin daily with supper. 10 mL 11  . pantoprazole (PROTONIX) 40 MG tablet Take 1 tablet (40 mg total) by mouth daily. 30 tablet 1  . polyethylene glycol (MIRALAX / GLYCOLAX) packet Take 17 g by mouth 2 (two) times daily. 14 each 0  . thiamine 100 MG tablet Take 1 tablet (100 mg total) by mouth daily. 30 tablet 0  . traMADol (ULTRAM) 50 MG tablet Take 1 tablet (50 mg total) by mouth every 8 (eight) hours as needed for severe pain. 30 tablet 0  . aspirin EC 81 MG tablet Take 1 tablet (81 mg total) by mouth daily. 30 tablet 11   No facility-administered medications prior to visit.     ROS Review of Systems  Constitutional: Negative.   Eyes: Positive for visual disturbance.  Respiratory: Negative.   Cardiovascular: Negative.   Gastrointestinal: Negative.   Psychiatric/Behavioral: Negative.  Negative for suicidal ideas.        Objective:  BP 134/88 (BP Location: Left  Arm, Patient Position: Sitting, Cuff Size: Large)   Pulse (!) 105   Temp (!) 97.5 F (36.4 C) (Oral)   Resp 18   Ht _0  (1.854 m)   Wt 206 lb (93.4 kg)   SpO2 97%   BMI 27.18 kg/m   BP/Weight 06/25/2017 07/28/95 02/05/9970  Systolic BP 820 990 -  Diastolic BP 88 98 -  Wt. (Lbs) 206 199 -  BMI 27.18 - 25.55     Physical Exam    Constitutional: He appears well-developed and well-nourished.  Eyes: Conjunctivae are normal. Pupils are equal, round, and reactive to light.  Cardiovascular: Normal rate, regular rhythm, normal heart sounds and intact distal pulses.  Pulmonary/Chest: Effort normal and breath sounds normal.  Abdominal: Soft. Bowel sounds are normal. There is no tenderness.  Skin: Skin is warm and dry.  BLE healing wound no drainage.  Nursing note and vitals reviewed.  Assessment & Plan:   1. Hospital discharge follow-up  - CBC - Ambulatory referral to Cardiology - Basic metabolic panel  2. Uncontrolled type 1 diabetes mellitus with hyperglycemia (HCC) -Check blood sugars QID. Bring glucometer and blood sugar log to next appt. - HgB A1c - Glucose (CBG) - Insulin Syringes, Disposable, U-100 1 ML MISC; 1 kit by Does not apply route once for 1 dose.  Dispense: 100 each; Refill: 11  3. Essential hypertension Controlled on current medications.   4. Coronary artery disease involving native heart, angina presence unspecified, unspecified vessel or lesion type  - aspirin EC 81 MG tablet; Take 1 tablet (81 mg total) by mouth daily.  Dispense: 30 tablet; Refill: 11 - Ambulatory referral to Cardiology  5. Ejection fraction < 50%  - Ambulatory referral to Cardiology  6. Blurred vision, bilateral -Visual acuity screen - Ambulatory referral to Ophthalmology  7. History of coronary artery stent placement  - aspirin EC 81 MG tablet; Take 1 tablet (81 mg total) by mouth daily.  Dispense: 30 tablet; Refill: 11 - Ambulatory referral to Cardiology       Follow-up: Return in about 2 weeks (around 07/09/2017) for Diabetes .   Alfonse Spruce FNP

## 2017-06-25 NOTE — Progress Notes (Signed)
Social Work  Discharge Note  The overall goal for the admission was met for:   Discharge location: Yes - home with sister  Length of Stay: No - d/c delayed x 1 day due to medical issues. Total LOS = 12 days  Discharge activity level: Yes - modified independent  Home/community participation: Yes  Services provided included: MD, RD, PT, OT, RN, TR, Pharmacy, [place]: Medicaid pending  Follow-up services arranged: Home Health: PT via [facility], DME: tub bench via [place] and Patient/Family has no preference for HH/DME agencies  Comments (or additional information):  Patient/Family verbalized understanding of follow-up arrangements: Yes  Individual responsible for coordination of the follow-up plan: pt  Confirmed correct DME delivered: Jalisha Enneking 06/25/2017    [place]

## 2017-06-26 ENCOUNTER — Other Ambulatory Visit: Payer: Self-pay | Admitting: Family Medicine

## 2017-06-26 DIAGNOSIS — Z09 Encounter for follow-up examination after completed treatment for conditions other than malignant neoplasm: Secondary | ICD-10-CM

## 2017-06-26 DIAGNOSIS — Z8639 Personal history of other endocrine, nutritional and metabolic disease: Secondary | ICD-10-CM

## 2017-06-26 LAB — CBC
Hematocrit: 35.2 % — ABNORMAL LOW (ref 37.5–51.0)
Hemoglobin: 11.4 g/dL — ABNORMAL LOW (ref 13.0–17.7)
MCH: 30.5 pg (ref 26.6–33.0)
MCHC: 32.4 g/dL (ref 31.5–35.7)
MCV: 94 fL (ref 79–97)
PLATELETS: 485 10*3/uL — AB (ref 150–379)
RBC: 3.74 x10E6/uL — ABNORMAL LOW (ref 4.14–5.80)
RDW: 17.1 % — AB (ref 12.3–15.4)
WBC: 7.8 10*3/uL (ref 3.4–10.8)

## 2017-06-26 LAB — BASIC METABOLIC PANEL
BUN / CREAT RATIO: 30 — AB (ref 9–20)
BUN: 29 mg/dL — AB (ref 6–24)
CHLORIDE: 96 mmol/L (ref 96–106)
CO2: 26 mmol/L (ref 20–29)
Calcium: 9.2 mg/dL (ref 8.7–10.2)
Creatinine, Ser: 0.98 mg/dL (ref 0.76–1.27)
GFR calc non Af Amer: 94 mL/min/{1.73_m2} (ref >59)
GFR, EST AFRICAN AMERICAN: 109 mL/min/{1.73_m2} (ref >59)
Glucose: 369 mg/dL — ABNORMAL HIGH (ref 65–99)
POTASSIUM: 5.3 mmol/L — AB (ref 3.5–5.2)
Sodium: 133 mmol/L — ABNORMAL LOW (ref 134–144)

## 2017-06-28 ENCOUNTER — Telehealth: Payer: Self-pay | Admitting: *Deleted

## 2017-06-28 NOTE — Telephone Encounter (Signed)
-----   Message from Lizbeth Bark, FNP sent at 06/26/2017  4:09 PM EST ----- Labs show improving anemia. Potassium level is slightly elevated.  Recommend lab visit only for repeat levels on Monday.

## 2017-06-28 NOTE — Telephone Encounter (Signed)
Patient verified DOB Patient is aware of anemia showing improvement and potassium being elevated. Patient is scheduled for Monday 07/01/17 at 10:00 for lab recheck. No further questions at this time.

## 2017-07-01 ENCOUNTER — Other Ambulatory Visit: Payer: Self-pay

## 2017-07-05 ENCOUNTER — Encounter: Payer: Self-pay | Attending: Physical Medicine & Rehabilitation | Admitting: Physical Medicine & Rehabilitation

## 2017-07-09 ENCOUNTER — Other Ambulatory Visit: Payer: Self-pay | Admitting: Family Medicine

## 2017-07-09 DIAGNOSIS — R110 Nausea: Secondary | ICD-10-CM

## 2017-07-09 DIAGNOSIS — Z760 Encounter for issue of repeat prescription: Secondary | ICD-10-CM

## 2017-07-09 MED ORDER — ONDANSETRON HCL 4 MG PO TABS: 4.0000 mg | ORAL_TABLET | Freq: Three times a day (TID) | ORAL | 0 refills | Status: DC | PRN

## 2017-07-09 MED ORDER — PANTOPRAZOLE SODIUM 40 MG PO TBEC: 40.0000 mg | DELAYED_RELEASE_TABLET | Freq: Every day | ORAL | 0 refills | Status: DC

## 2017-07-09 MED FILL — ONDANSETRON HCL 4 MG TABLET: 4 | 6 days supply | Qty: 20 | Fill #0

## 2017-07-09 MED FILL — ?PANTOPRAZOLE SOD DR 40MG: 40 MG | 30 days supply | Qty: 30 | Fill #0

## 2017-07-09 NOTE — Telephone Encounter (Signed)
Patient called requesting a refill on gabapentin 300mg , indonethacin 50 mg,  Lisinopril,  Pantoprazole 40 mg  sulfamethosadole and zofran

## 2017-07-09 NOTE — Telephone Encounter (Signed)
Patient is not on gabapentin, indomethacin, lisinopril, or sulfamethoxazole per the med list. I will defer to PCP. Ondanestron already refilled by PCP and I refilled pantoprazole.

## 2017-07-09 NOTE — Telephone Encounter (Signed)
Medication d/c during hospitalization due to hypotension, altered kidney function, and elevated LFT's. Cannot refill these medications without office visit.

## 2017-07-10 NOTE — Telephone Encounter (Signed)
Pt aware of message. Appointment rescheduled for soonest next available in place of appointment on 07/25/17. Pt verbalized understanding.

## 2017-07-14 ENCOUNTER — Other Ambulatory Visit: Payer: Self-pay

## 2017-07-14 ENCOUNTER — Emergency Department (HOSPITAL_COMMUNITY): Payer: Medicaid Other

## 2017-07-14 ENCOUNTER — Encounter (HOSPITAL_COMMUNITY): Payer: Self-pay

## 2017-07-14 ENCOUNTER — Inpatient Hospital Stay (HOSPITAL_COMMUNITY)
Admission: EM | Admit: 2017-07-14 | Discharge: 2017-07-15 | DRG: 638 | Disposition: A | Discharge status: Home / Self Care | Payer: Medicaid Other | Attending: Internal Medicine | Admitting: Internal Medicine

## 2017-07-14 ENCOUNTER — Inpatient Hospital Stay (HOSPITAL_COMMUNITY): Payer: Medicaid Other

## 2017-07-14 DIAGNOSIS — Z8673 Personal history of transient ischemic attack (TIA), and cerebral infarction without residual deficits: Secondary | ICD-10-CM | POA: Diagnosis not present

## 2017-07-14 DIAGNOSIS — Z79899 Other long term (current) drug therapy: Secondary | ICD-10-CM | POA: Diagnosis not present

## 2017-07-14 DIAGNOSIS — Z794 Long term (current) use of insulin: Secondary | ICD-10-CM | POA: Diagnosis not present

## 2017-07-14 DIAGNOSIS — E871 Hypo-osmolality and hyponatremia: Secondary | ICD-10-CM | POA: Diagnosis present

## 2017-07-14 DIAGNOSIS — E1042 Type 1 diabetes mellitus with diabetic polyneuropathy: Secondary | ICD-10-CM | POA: Diagnosis present

## 2017-07-14 DIAGNOSIS — R112 Nausea with vomiting, unspecified: Secondary | ICD-10-CM | POA: Diagnosis not present

## 2017-07-14 DIAGNOSIS — Z7982 Long term (current) use of aspirin: Secondary | ICD-10-CM | POA: Diagnosis not present

## 2017-07-14 DIAGNOSIS — Z8619 Personal history of other infectious and parasitic diseases: Secondary | ICD-10-CM

## 2017-07-14 DIAGNOSIS — E875 Hyperkalemia: Secondary | ICD-10-CM | POA: Diagnosis present

## 2017-07-14 DIAGNOSIS — E860 Dehydration: Secondary | ICD-10-CM | POA: Diagnosis present

## 2017-07-14 DIAGNOSIS — I2510 Atherosclerotic heart disease of native coronary artery without angina pectoris: Secondary | ICD-10-CM | POA: Diagnosis present

## 2017-07-14 DIAGNOSIS — M545 Low back pain: Secondary | ICD-10-CM

## 2017-07-14 DIAGNOSIS — R079 Chest pain, unspecified: Secondary | ICD-10-CM | POA: Diagnosis not present

## 2017-07-14 DIAGNOSIS — G8929 Other chronic pain: Secondary | ICD-10-CM

## 2017-07-14 DIAGNOSIS — M79605 Pain in left leg: Secondary | ICD-10-CM

## 2017-07-14 DIAGNOSIS — E1065 Type 1 diabetes mellitus with hyperglycemia: Secondary | ICD-10-CM | POA: Diagnosis present

## 2017-07-14 DIAGNOSIS — E1010 Type 1 diabetes mellitus with ketoacidosis without coma: Secondary | ICD-10-CM | POA: Diagnosis present

## 2017-07-14 DIAGNOSIS — A084 Viral intestinal infection, unspecified: Secondary | ICD-10-CM | POA: Diagnosis present

## 2017-07-14 DIAGNOSIS — Z955 Presence of coronary angioplasty implant and graft: Secondary | ICD-10-CM | POA: Diagnosis not present

## 2017-07-14 DIAGNOSIS — Z833 Family history of diabetes mellitus: Secondary | ICD-10-CM | POA: Diagnosis not present

## 2017-07-14 DIAGNOSIS — N179 Acute kidney failure, unspecified: Secondary | ICD-10-CM | POA: Diagnosis present

## 2017-07-14 DIAGNOSIS — Z9114 Patient's other noncompliance with medication regimen: Secondary | ICD-10-CM | POA: Diagnosis not present

## 2017-07-14 DIAGNOSIS — Z87891 Personal history of nicotine dependence: Secondary | ICD-10-CM | POA: Diagnosis not present

## 2017-07-14 DIAGNOSIS — R350 Frequency of micturition: Secondary | ICD-10-CM

## 2017-07-14 DIAGNOSIS — R509 Fever, unspecified: Secondary | ICD-10-CM | POA: Diagnosis not present

## 2017-07-14 DIAGNOSIS — I10 Essential (primary) hypertension: Secondary | ICD-10-CM

## 2017-07-14 DIAGNOSIS — R748 Abnormal levels of other serum enzymes: Secondary | ICD-10-CM | POA: Diagnosis not present

## 2017-07-14 DIAGNOSIS — I69398 Other sequelae of cerebral infarction: Secondary | ICD-10-CM

## 2017-07-14 DIAGNOSIS — K219 Gastro-esophageal reflux disease without esophagitis: Secondary | ICD-10-CM

## 2017-07-14 DIAGNOSIS — M069 Rheumatoid arthritis, unspecified: Secondary | ICD-10-CM

## 2017-07-14 LAB — CBC WITH DIFFERENTIAL/PLATELET
BASOS ABS: 0 10*3/uL (ref 0.0–0.1)
Basophils Relative: 0 %
EOS ABS: 0 10*3/uL (ref 0.0–0.7)
Eosinophils Relative: 0 %
HCT: 38.3 % — ABNORMAL LOW (ref 39.0–52.0)
Hemoglobin: 13.3 g/dL (ref 13.0–17.0)
LYMPHS ABS: 3.5 10*3/uL (ref 0.7–4.0)
Lymphocytes Relative: 21 %
MCH: 30.8 pg (ref 26.0–34.0)
MCHC: 34.7 g/dL (ref 30.0–36.0)
MCV: 88.7 fL (ref 78.0–100.0)
Monocytes Absolute: 0.8 10*3/uL (ref 0.1–1.0)
Monocytes Relative: 5 %
NEUTROS ABS: 12.4 10*3/uL — AB (ref 1.7–7.7)
Neutrophils Relative %: 74 %
Platelets: 272 10*3/uL (ref 150–400)
RBC: 4.32 MIL/uL (ref 4.22–5.81)
RDW: 14 % (ref 11.5–15.5)
WBC: 16.7 10*3/uL — ABNORMAL HIGH (ref 4.0–10.5)

## 2017-07-14 LAB — COMPREHENSIVE METABOLIC PANEL
ALBUMIN: 2.9 g/dL — AB (ref 3.5–5.0)
ALT: 362 U/L — ABNORMAL HIGH (ref 17–63)
ANION GAP: 19 — AB (ref 5–15)
AST: 310 U/L — ABNORMAL HIGH (ref 15–41)
Alkaline Phosphatase: 110 U/L (ref 38–126)
BUN: 32 mg/dL — AB (ref 6–20)
CALCIUM: 8.8 mg/dL — AB (ref 8.9–10.3)
CO2: 18 mmol/L — AB (ref 22–32)
CREATININE: 1.7 mg/dL — AB (ref 0.61–1.24)
Chloride: 91 mmol/L — ABNORMAL LOW (ref 101–111)
GFR calc non Af Amer: 48 mL/min — ABNORMAL LOW (ref >60)
GFR, EST AFRICAN AMERICAN: 55 mL/min — AB (ref >60)
Glucose, Bld: 636 mg/dL (ref 65–99)
POTASSIUM: 5.8 mmol/L — AB (ref 3.5–5.1)
SODIUM: 128 mmol/L — AB (ref 135–145)
Total Bilirubin: 1.6 mg/dL — ABNORMAL HIGH (ref 0.3–1.2)
Total Protein: 9.2 g/dL — ABNORMAL HIGH (ref 6.5–8.1)

## 2017-07-14 LAB — CBG MONITORING, ED
GLUCOSE-CAPILLARY: 552 mg/dL — AB (ref 65–99)
GLUCOSE-CAPILLARY: 525 mg/dL — AB (ref 65–99)
Glucose-Capillary: 528 mg/dL (ref 65–99)

## 2017-07-14 LAB — I-STAT VENOUS BLOOD GAS, ED
ACID-BASE DEFICIT: 2 mmol/L (ref 0.0–2.0)
Bicarbonate: 23.1 mmol/L (ref 20.0–28.0)
O2 SAT: 74 %
PO2 VEN: 39 mmHg (ref 32.0–45.0)
TCO2: 24 mmol/L (ref 22–32)
pCO2, Ven: 38.3 mmHg — ABNORMAL LOW (ref 44.0–60.0)
pH, Ven: 7.39 (ref 7.250–7.430)

## 2017-07-14 LAB — INFLUENZA PANEL BY PCR (TYPE A & B)
INFLAPCR: NEGATIVE
Influenza B By PCR: NEGATIVE

## 2017-07-14 LAB — GLUCOSE, CAPILLARY: Glucose-Capillary: 398 mg/dL — ABNORMAL HIGH (ref 65–99)

## 2017-07-14 LAB — I-STAT CG4 LACTIC ACID, ED: LACTIC ACID, VENOUS: 3.34 mmol/L — AB (ref 0.5–1.9)

## 2017-07-14 LAB — I-STAT TROPONIN, ED: Troponin i, poc: 0 ng/mL (ref 0.00–0.08)

## 2017-07-14 MED ORDER — PANTOPRAZOLE SODIUM 40 MG PO TBEC
40.0000 mg | DELAYED_RELEASE_TABLET | Freq: Every day | ORAL | Status: DC
  Administered 2017-07-15: 40 mg via ORAL
  Filled 2017-07-14: qty 1

## 2017-07-14 MED ORDER — SODIUM CHLORIDE 0.9 % IV BOLUS (SEPSIS)
1000.0000 mL | Freq: Once | INTRAVENOUS | Status: AC
  Administered 2017-07-14: 1000 mL via INTRAVENOUS

## 2017-07-14 MED ORDER — ONDANSETRON HCL 4 MG/2ML IJ SOLN: 4.0000 mg | Freq: Four times a day (QID) | INTRAMUSCULAR | Status: DC | PRN

## 2017-07-14 MED ORDER — SODIUM CHLORIDE 0.9 % IV SOLN
INTRAVENOUS | Status: DC
  Administered 2017-07-14: via INTRAVENOUS

## 2017-07-14 MED ORDER — DEXTROSE-NACL 5-0.45 % IV SOLN
INTRAVENOUS | Status: DC
  Administered 2017-07-15: 100 mL/h via INTRAVENOUS

## 2017-07-14 MED ORDER — DEXTROSE-NACL 5-0.45 % IV SOLN: INTRAVENOUS | Status: DC

## 2017-07-14 MED ORDER — ONDANSETRON HCL 4 MG/2ML IJ SOLN
4.0000 mg | Freq: Once | INTRAMUSCULAR | Status: AC
  Administered 2017-07-14: 4 mg via INTRAVENOUS
  Filled 2017-07-14: qty 2

## 2017-07-14 MED ORDER — ENOXAPARIN SODIUM 40 MG/0.4ML ~~LOC~~ SOLN
40.0000 mg | SUBCUTANEOUS | Status: DC
  Administered 2017-07-15: 40 mg via SUBCUTANEOUS
  Filled 2017-07-14: qty 0.4

## 2017-07-14 MED ORDER — SODIUM CHLORIDE 0.9 % IV SOLN
INTRAVENOUS | Status: DC
  Administered 2017-07-14: 4.7 [IU]/h via INTRAVENOUS
  Filled 2017-07-14: qty 1

## 2017-07-14 MED ORDER — ONDANSETRON HCL 4 MG PO TABS: 4.0000 mg | ORAL_TABLET | Freq: Four times a day (QID) | ORAL | Status: DC | PRN

## 2017-07-14 MED ORDER — ASPIRIN EC 81 MG PO TBEC
81.0000 mg | DELAYED_RELEASE_TABLET | Freq: Every day | ORAL | Status: DC
  Administered 2017-07-15: 81 mg via ORAL
  Filled 2017-07-14: qty 1

## 2017-07-14 NOTE — ED Notes (Signed)
Tray RN aware of cbg at bedside.

## 2017-07-14 NOTE — ED Notes (Signed)
Checked CBG 528, RN Trey informed

## 2017-07-14 NOTE — ED Notes (Signed)
Checked pt CBG 525, RN Trey informed

## 2017-07-14 NOTE — ED Triage Notes (Signed)
Pt arrived from home via [facility] r/t pain in chest with generalized weakness, cough, and chills X2-3 days. EMS reports CBG 309, pt states CBG 300's yesterday at home.

## 2017-07-14 NOTE — ED Provider Notes (Signed)
[facility] EMERGENCY DEPARTMENT Provider Note   CSN: [id number] Arrival date & time: 07/14/17  1837     History   Chief Complaint Chief Complaint  Patient presents with  . Chest Pain    HPI Matthew Riley is a 42 y.o. male.  HPI   Patient is a 42 year old male presenting with chest pain cough chills abdominal pain vomiting and difficulty controlling his sugars.  Patient is insulin-dependent diabetic.  Patient reports that he has not been compliant with medications recently because he is been feeling so ill  No real abdominal pain, he reports it is achy all over.  Reports chills and fevers.  Past Medical History:  Diagnosis Date  . Coronary artery disease   . Diabetes mellitus without complication (HCC)   . GERD (gastroesophageal reflux disease)   . Hepatitis B   . Hypertension   . Renal disorder   . Rheumatoid arthritis (HCC)   . Stroke [facility]) 02/2017    Patient Active Problem List   Diagnosis Date Noted  . Hypokalemia   . Bacteremia   . PNA (pneumonia)   . Acute blood loss anemia   . Acute systolic heart failure (HCC)   . Labile blood glucose   . Hypoglycemia   . Debility   . Neuropathic pain   . Aspiration pneumonia of both lungs (HCC)   . Leukocytosis   . Acute systolic CHF (congestive heart failure) (HCC)   . Ulcers of both lower extremities (HCC)   . Rheumatoid arthritis (HCC)   . 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 Transaminitis   . Cough   . Encephalopathy 06/03/2017  . Acute respiratory failure with hypoxia (HCC)   . Septic shock (HCC)   . Abdominal pain 04/16/2017  . Tobacco abuse 04/16/2017  . Leg wound, right 04/16/2017  . Bilateral leg edema 04/16/2017  . 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 CAD (coronary artery disease) 04/16/2017  . Hyperglycemia 04/16/2017  . Hypertension   . Uncontrolled type 1 diabetes mellitus with hyperglycemia (HCC)   . Neuropathy   . Nausea &  vomiting 03/08/2017  . GERD (gastroesophageal reflux disease) 03/08/2017  . Hyponatremia 03/08/2017  . AKI (acute kidney injury) (HCC) 03/08/2017  . Hyperkalemia 03/08/2017  . DKA, type 1 [facility])     Past Surgical History:  Procedure Laterality Date  . CORONARY ANGIOPLASTY WITH STENT PLACEMENT         Home Medications    Prior to Admission medications   Medication Sig Start Date End Date Taking? Authorizing Provider  aspirin EC 81 MG tablet Take 1 tablet (81 mg total) by mouth daily. 06/25/17  Yes Hairston, Oren Beckmann, FNP  cyclobenzaprine (FLEXERIL) 5 MG tablet Take 1 tablet (5 mg total) by mouth 3 (three) times daily as needed for muscle spasms. 06/21/17  Yes Love, Evlyn Kanner, PA-C  insulin aspart (NOVOLOG) 100 UNIT/ML injection Inject 5 Units into the skin 3 (three) times daily with meals. 05/16/17  Yes Hairston, Oren Beckmann, FNP  insulin aspart protamine- aspart (NOVOLOG MIX 70/30) (70-30) 100 UNIT/ML injection Inject 0.25 mLs (25 Units total) into the skin daily with breakfast. Patient taking differently: Inject 25 Units into the skin 2 (two) times daily with a meal.  06/24/17  Yes Love, Evlyn Kanner, PA-C  guaiFENesin (MUCINEX) 600 MG 12 hr tablet Take 1 tablet (600 mg total) by mouth 2 (two) times daily as needed. Patient not taking: Reported on 07/14/2017 06/24/17  Love, Pamela S, PA-C  insulin aspart protamine- aspart (NOVOLOG MIX 70/30) (70-30) 100 UNIT/ML injection Inject 0.25 mLs (25 Units total) into the skin daily with supper. Patient not taking: Reported on 07/14/2017 06/24/17   Love, Evlyn Kanner, PA-C  ondansetron (ZOFRAN) 4 MG tablet Take 1 tablet (4 mg total) by mouth every 8 (eight) hours as needed for nausea or vomiting. Patient not taking: Reported on 07/14/2017 07/09/17   Lizbeth Bark, FNP  pantoprazole (PROTONIX) 40 MG tablet Take 1 tablet (40 mg total) by mouth daily. Patient not taking: Reported on 07/14/2017 07/09/17   Lizbeth Bark, FNP  polyethylene glycol (MIRALAX  / GLYCOLAX) packet Take 17 g by mouth 2 (two) times daily. Patient not taking: Reported on 07/14/2017 06/21/17   Love, Evlyn Kanner, PA-C  thiamine 100 MG tablet Take 1 tablet (100 mg total) by mouth daily. Patient not taking: Reported on 07/14/2017 06/13/17   Regalado, Jon Billings A, MD  traMADol (ULTRAM) 50 MG tablet Take 1 tablet (50 mg total) by mouth every 8 (eight) hours as needed for severe pain. Patient not taking: Reported on 07/14/2017 05/15/17   Lizbeth Bark, FNP    Family History Family History  Problem Relation Age of Onset  . Diabetes Mother   . Diabetes Father     Social History Social History   Tobacco Use  . Smoking status: Former Games developer  . Smokeless tobacco: Never Used  . Tobacco comment: patient denies using today  Substance Use Topics  . Alcohol use: No    Frequency: Never    Comment: patient denies  . Drug use: No    Comment: patient denies     Allergies   Patient has no known allergies.   Review of Systems Review of Systems  Constitutional: Positive for chills, fatigue and fever. Negative for activity change.  HENT: Positive for congestion.   Respiratory: Positive for cough. Negative for shortness of breath.   Cardiovascular: Negative for chest pain.  Gastrointestinal: Positive for nausea and vomiting. Negative for abdominal pain and diarrhea.  All other systems reviewed and are negative.    Physical Exam Updated Vital Signs BP (!) 131/91 (BP Location: Right Arm)   Pulse (!) 103   Temp 98.7 F (37.1 C) (Oral)   Resp 20   Ht 6\' 1"  (1.854 m)   Wt 93.4 kg (206 lb)   SpO2 99%   BMI 27.18 kg/m   Physical Exam  Constitutional: He is oriented to person, place, and time. He appears well-nourished.  HENT:  Head: Normocephalic.  Eyes: Conjunctivae are normal.  Cardiovascular: Regular rhythm and normal pulses.  Tachycardic.  Pulmonary/Chest: Effort normal and breath sounds normal.  Abdominal: Soft. There is no tenderness.  Musculoskeletal:        Right lower leg: Normal.       Left lower leg: Normal.  Neurological: He is oriented to person, place, and time.  Skin: Skin is warm and dry. He is not diaphoretic.  Psychiatric: He has a normal mood and affect. His behavior is normal.     ED Treatments / Results  Labs (all labs ordered are listed, but only abnormal results are displayed) Labs Reviewed  CBC WITH DIFFERENTIAL/PLATELET - Abnormal; Notable for the following components:      Result Value   WBC 16.7 (*)    HCT 38.3 (*)    All other components within normal limits  CBG MONITORING, ED - Abnormal; Notable for the following components:   Glucose-Capillary 552 (*)  All other components within normal limits  I-STAT CG4 LACTIC ACID, ED - Abnormal; Notable for the following components:   Lactic Acid, Venous 3.34 (*)    All other components within normal limits  COMPREHENSIVE METABOLIC PANEL  I-STAT TROPONIN, ED  I-STAT VENOUS BLOOD GAS, ED    EKG  EKG Interpretation None       Radiology Dg Chest 2 View  Result Date: 07/14/2017 CLINICAL DATA:  Generalized weakness, cough and chills for 3 days, history stroke, coronary artery disease, diabetes mellitus, hypertension EXAM: CHEST  2 VIEW COMPARISON:  06/24/2017 FINDINGS: Normal heart size, mediastinal contours, and pulmonary vascularity. Lungs clear. No pleural effusion or pneumothorax. Bones unremarkable. IMPRESSION: Normal exam. Improved bibasilar opacities since previous exam. Electronically Signed   By: Ulyses Southward M.D.   On: 07/14/2017 19:29    Procedures Procedures (including critical care time)  Medications Ordered in ED Medications  sodium chloride 0.9 % bolus 1,000 mL (not administered)  sodium chloride 0.9 % bolus 1,000 mL (not administered)     Initial Impression / Assessment and Plan / ED Course  I have reviewed the triage vital signs and the nursing notes.  Pertinent labs & imaging results that were available during my care of the patient were  reviewed by me and considered in my medical decision making (see chart for details).    Patient is a 42 year old male presenting with chest pain cough chills abdominal pain vomiting and difficulty controlling his sugars.  Patient is insulin-dependent diabetic.  Patient reports that he has not been compliant with medications recently because he is been feeling so ill  No real abdominal pain, he reports it is achy all over.  Reports chills and fevers.  8:01 PM Given patient's elevated sugar, concerned about DKA.  Will get labs, give fluids.  Patient's abdomen soft, consider flu.  Final Clinical Impressions(s) / ED Diagnoses   Final diagnoses:  None    ED Discharge Orders    None       Abelino Derrick, MD 07/18/17 [phone]

## 2017-07-14 NOTE — H&P (Signed)
Date: 07/14/2017               Patient Name:  Matthew Riley MRN: [id number]  DOB: 08/07/1975 Age / Sex: 42 y.o., male   PCP: Lizbeth Bark, FNP         Medical Service: Internal Medicine Teaching Service         Attending Physician: Dr. Doneen Poisson, MD    First Contact: Dr. Frances Furbish Pager: [phone]  Second Contact: Dr. Nelson Chimes Pager: [phone]       After Hours (After 5p/  First Contact Pager: [phone]  weekends / holidays): Second Contact Pager: [phone]   Chief Complaint: Vomiting for two days with burning chest pain  History of Present Illness: Matthew Riley is a 42 yoM w/ known CAD reportedly with stenting and type one insulin dependent diabetes who presented with 2 days of nausea, vomiting, burning chest pain and headache. He recently moved here from [place] ~6 months prior. He has a PMHx also significant for HTN, RA, stroke, Hep B, and GERD. He stated that while in his usual state of health he began to rapidly vomit ~2 days prior. He is unaware of sick contacts or ingestion of tainted or otherwise atypical food. The vomitus consisted of initially a clear fluid and eventually progressed to a green/rust color when pressed. This was followed by unproductive retching the remaining day or so. Could not recall even a rough count of the number of episodes of emesis. In addition, the patient attested to an associated cough, rhinorrhea, headache, fever, chills, and decreased PO intake. This was to the extent that he skipped his medications today including his insulin. He stated that he would normally inject 5 units of novolog TID w/ meals and 25 units 70/30 mix once with breakfast and again with his evening meal but due to his fatigue, he only used his morning dose the day of admission.  The patient is also concerned with running out of his gabapentin which he says he is prescribed at [facility]. He was to call back Monday morning to see if this could be  refilled. The gabapentin is reportedly for his chronic left leg and lower back pain secondary to his stroke.   He denied diarrhea, dysuria, myalgias, visual changes, severe abdominal pain, flank pain, productive cough, thoracic back pain, chest pain on exertion, or dyspnea. He attested to increased urinary frequency but not significantly elevated above baseline as he has poor bladder control since the stroke.   IN the ED, CBG indicated a serum glucose of 552 initially with the BMP indicating an anion gap metabolic acidosis with bicard 18 and gap of 19. This prompted the patient to be placed on DKA protocol with potassium noted to be 5.8, allowing for the insulin gtt to be initiated with rapid fluid administration performed. Lactate was elevated to 3.34 as was the WBC count to 16.7. There was pseudo hyponatremia of 128 correctable to >13mmol/L. BUN was mildly elevated but not significantly above baseline. Of note, the serum Cr was 1.70, above the baseline of ~1.1 previously documented on record. The I-stat troponin was negative at 0.00. Influenza panel was also negative. CXR failed to demonstrate an acute cardiopulmonary process with improved bibasilar opacities since previous evaluation noted. EKG demonstrated sinus tachycardia with a QTC of 481 and no peaked T-waves or T-wave malformations consistent with ACS.  Meds:  Current Meds  Medication Sig  . aspirin EC 81 MG tablet Take 1 tablet (81  mg total) by mouth daily.  . cyclobenzaprine (FLEXERIL) 5 MG tablet Take 1 tablet (5 mg total) by mouth 3 (three) times daily as needed for muscle spasms.  . insulin aspart (NOVOLOG) 100 UNIT/ML injection Inject 5 Units into the skin 3 (three) times daily with meals.  . insulin aspart protamine- aspart (NOVOLOG MIX 70/30) (70-30) 100 UNIT/ML injection Inject 0.25 mLs (25 Units total) into the skin daily with breakfast. (Patient taking differently: Inject 25 Units into the skin 2 (two) times daily with a meal. )    Allergies: Allergies as of 07/14/2017  . (No Known Allergies)   Past Medical History:  Diagnosis Date  . Coronary artery disease   . Diabetes mellitus without complication (HCC)   . GERD (gastroesophageal reflux disease)   . Hepatitis B   . Hypertension   . Renal disorder   . Rheumatoid arthritis (HCC)   . Stroke [facility]) 02/2017   Family History:  Family History  Problem Relation Age of Onset  . Diabetes Mother   . Diabetes Father   I personally confirmed the above family history.  Social History:  Social History   Tobacco Use  . Smoking status: Former Games developer  . Smokeless tobacco: Never Used  . Tobacco comment: reports smoking for many years, quit years ago  Substance Use Topics  . Alcohol use: No    Frequency: Never    Comment: former use, last drink early 2018  . Drug use: No    Comment: patient denies  I personally confirmed the above social history.  Review of Systems: A complete ROS was negative except as per HPI.   Physical Exam: Blood pressure 112/80, pulse 99, temperature 98 F (36.7 C), temperature source Oral, resp. rate 19, height 6\' 1"  (1.854 m), weight 206 lb (93.4 kg), SpO2 100 %. Physical Exam  Constitutional: He is oriented to person, place, and time. He appears well-developed and well-nourished. No distress.  HENT:  Head: Normocephalic and atraumatic.  Eyes: Conjunctivae and EOM are normal. Pupils are equal, round, and reactive to light.  Neck: Normal range of motion.  Cardiovascular: Normal rate and regular rhythm.  No murmur heard. Pulmonary/Chest: Effort normal and breath sounds normal. No stridor. No respiratory distress. He has no decreased breath sounds. He has no wheezes. He has no rales.  Abdominal: Soft. Bowel sounds are normal. He exhibits no distension.  Musculoskeletal: He exhibits no edema.  Neurological: He is alert and oriented to person, place, and time.  Skin: Skin is warm. He is not diaphoretic.  Psychiatric: He has a normal  mood and affect.  Physical exam was unremarkable.   EKG: personally reviewed my interpretation is as above, see HPI.  CXR: personally reviewed my interpretation is as above, see HPI.  Assessment & Plan by Problem: Principal Problem:   Uncontrolled type 1 diabetes mellitus with hyperglycemia (HCC) Active Problems:   AKI (acute kidney injury) (HCC)   Hyperkalemia   CAD (coronary artery disease)  Assessment: 47 yoM who known insulin dependent DM since his late 20's who presented with nausea, vomiting, headache, chills, with notable hyperglycemia following multiple missed doses of his home insulin. The patient has most likely experienced hyperglycemia 2/2 a viral upper GI illness and missed doses of his home insulin.   Diabetic ketoacidosis: As per HPI, patient's presentation concerning for DKA due to the hyperglycemia, anion gap metabolic acidosis, hyperglycemia, polyuria, polydipsia, insulin dependent history with the UA pending, although ordered several hours after initiation of therapy.  -Insulin gtt  until anion GAP closes in BMP's x 2 -Initiate D5W-0.45% normal saline infusion at 127ml/hr when CBG250mg /dL  -Once able to tolerate PO intake, will place SSI and long acting -BMP q4 hours for potassium and anion GAP -Will transition to TID Novolog and Lantus from gtt  Nausea and Vomiting resolved: -Zofran PO or IV Q6 hours PRN for nausea/vomiting  Hyperkalemia: He was noted to have an elevated serum potassium to 5.8 consistent with hyperglycemia although his total body potassium may be decreased due to the net efflux of potassium from the cells.  -BMP q4 hours -Replace as indicated  AKI: He was noted to have a >50% increase in his serum Cr to 1.70, most likely secondary to increased water losses and decreased water consumption. The AKI was treated with fluids initially. -BMP Q4 hours -Fluids continuous will be adjusted as indicated  CAD: Although not unable to confirm, the patient  attested to a previous cardiac stent following chest pain in while a resident of [place].  -Continue ASA -Consider initiation of a statin  -Stat EKG for chest pain or status change  Diet: NPO Code: Full Fluids: 128ml/hr Dvt ppx: enoxaparin  Dispo: Admit patient to Observation with expected length of stay less than 2 midnights.  Signed: Lanelle Bal, MD 07/14/2017, 11:23 PM  Pager: Pager# [phone]

## 2017-07-15 ENCOUNTER — Encounter (HOSPITAL_COMMUNITY): Payer: Self-pay | Admitting: *Deleted

## 2017-07-15 ENCOUNTER — Ambulatory Visit: Payer: Self-pay | Admitting: Family Medicine

## 2017-07-15 DIAGNOSIS — E1065 Type 1 diabetes mellitus with hyperglycemia: Secondary | ICD-10-CM

## 2017-07-15 DIAGNOSIS — E860 Dehydration: Secondary | ICD-10-CM

## 2017-07-15 LAB — BASIC METABOLIC PANEL
Anion gap: 10 (ref 5–15)
Anion gap: 10 (ref 5–15)
Anion gap: 16 — ABNORMAL HIGH (ref 5–15)
BUN: 30 mg/dL — AB (ref 6–20)
BUN: 30 mg/dL — AB (ref 6–20)
BUN: 32 mg/dL — AB (ref 6–20)
CALCIUM: 8.4 mg/dL — AB (ref 8.9–10.3)
CHLORIDE: 102 mmol/L (ref 101–111)
CHLORIDE: 99 mmol/L — AB (ref 101–111)
CO2: 21 mmol/L — ABNORMAL LOW (ref 22–32)
CO2: 24 mmol/L (ref 22–32)
CO2: 25 mmol/L (ref 22–32)
CREATININE: 1.42 mg/dL — AB (ref 0.61–1.24)
CREATININE: 1.3 mg/dL — AB (ref 0.61–1.24)
Calcium: 8.6 mg/dL — ABNORMAL LOW (ref 8.9–10.3)
Calcium: 8.7 mg/dL — ABNORMAL LOW (ref 8.9–10.3)
Chloride: 101 mmol/L (ref 101–111)
Creatinine, Ser: 1.61 mg/dL — ABNORMAL HIGH (ref 0.61–1.24)
GFR calc Af Amer: 59 mL/min — ABNORMAL LOW (ref >60)
GFR calc Af Amer: >60 mL/min (ref >60)
GFR calc non Af Amer: 51 mL/min — ABNORMAL LOW (ref >60)
GFR calc non Af Amer: 59 mL/min — ABNORMAL LOW (ref >60)
GFR calc non Af Amer: >60 mL/min (ref >60)
GLUCOSE: 134 mg/dL — AB (ref 65–99)
GLUCOSE: 472 mg/dL — AB (ref 65–99)
Glucose, Bld: 249 mg/dL — ABNORMAL HIGH (ref 65–99)
POTASSIUM: 4 mmol/L (ref 3.5–5.1)
POTASSIUM: 4.4 mmol/L (ref 3.5–5.1)
Potassium: 4.7 mmol/L (ref 3.5–5.1)
SODIUM: 136 mmol/L (ref 135–145)
SODIUM: 137 mmol/L (ref 135–145)
Sodium: 135 mmol/L (ref 135–145)

## 2017-07-15 LAB — CBC
HEMATOCRIT: 34.7 % — AB (ref 39.0–52.0)
Hemoglobin: 12.3 g/dL — ABNORMAL LOW (ref 13.0–17.0)
MCH: 30.4 pg (ref 26.0–34.0)
MCHC: 35.4 g/dL (ref 30.0–36.0)
MCV: 85.9 fL (ref 78.0–100.0)
PLATELETS: 277 10*3/uL (ref 150–400)
RBC: 4.04 MIL/uL — ABNORMAL LOW (ref 4.22–5.81)
RDW: 14 % (ref 11.5–15.5)
WBC: 19.3 10*3/uL — ABNORMAL HIGH (ref 4.0–10.5)

## 2017-07-15 LAB — GLUCOSE, CAPILLARY
GLUCOSE-CAPILLARY: 158 mg/dL — AB (ref 65–99)
GLUCOSE-CAPILLARY: 96 mg/dL (ref 65–99)
GLUCOSE-CAPILLARY: 129 mg/dL — AB (ref 65–99)
GLUCOSE-CAPILLARY: 160 mg/dL — AB (ref 65–99)
Glucose-Capillary: 122 mg/dL — ABNORMAL HIGH (ref 65–99)
Glucose-Capillary: 201 mg/dL — ABNORMAL HIGH (ref 65–99)
Glucose-Capillary: 271 mg/dL — ABNORMAL HIGH (ref 65–99)
Glucose-Capillary: 345 mg/dL — ABNORMAL HIGH (ref 65–99)
Glucose-Capillary: 87 mg/dL (ref 65–99)

## 2017-07-15 LAB — MRSA PCR SCREENING: MRSA by PCR: NEGATIVE

## 2017-07-15 MED ORDER — POTASSIUM CHLORIDE IN NACL 20-0.9 MEQ/L-% IV SOLN
INTRAVENOUS | Status: AC
  Administered 2017-07-15: 06:00:00 via INTRAVENOUS
  Filled 2017-07-15 (×2): qty 1000

## 2017-07-15 MED ORDER — POTASSIUM CHLORIDE 10 MEQ/100ML IV SOLN: 10.0000 meq | INTRAVENOUS | Status: DC

## 2017-07-15 MED ORDER — GABAPENTIN 300 MG PO CAPS: 300.0000 mg | ORAL_CAPSULE | Freq: Three times a day (TID) | ORAL | 0 refills | Status: DC

## 2017-07-15 MED ORDER — LIVING WELL WITH DIABETES BOOK: 1.0000 | Freq: Once | 0 refills | Status: AC

## 2017-07-15 MED ORDER — ATORVASTATIN CALCIUM 40 MG PO TABS: 40.0000 mg | ORAL_TABLET | Freq: Every day | ORAL | 0 refills | Status: DC

## 2017-07-15 MED ORDER — GABAPENTIN 300 MG PO CAPS
300.0000 mg | ORAL_CAPSULE | Freq: Three times a day (TID) | ORAL | Status: DC
  Administered 2017-07-15 (×2): 300 mg via ORAL
  Filled 2017-07-15 (×2): qty 1

## 2017-07-15 MED ORDER — ATORVASTATIN CALCIUM 40 MG PO TABS: 40.0000 mg | ORAL_TABLET | Freq: Every day | ORAL | Status: DC

## 2017-07-15 MED ORDER — INSULIN STARTER KIT- SYRINGES (ENGLISH): 1.0000 | Freq: Once | 0 refills | Status: AC

## 2017-07-15 MED ORDER — INSULIN ASPART PROT & ASPART (70-30 MIX) 100 UNIT/ML ~~LOC~~ SUSP
20.0000 [IU] | Freq: Two times a day (BID) | SUBCUTANEOUS | Status: DC
  Administered 2017-07-15 (×2): 20 [IU] via SUBCUTANEOUS
  Filled 2017-07-15: qty 10

## 2017-07-15 MED ORDER — LIVING WELL WITH DIABETES BOOK
Freq: Once | Status: AC
  Administered 2017-07-15: 15:00:00
  Filled 2017-07-15: qty 1

## 2017-07-15 MED ORDER — INFLUENZA VAC SPLIT QUAD 0.5 ML IM SUSY: 0.5000 mL | PREFILLED_SYRINGE | INTRAMUSCULAR | Status: DC

## 2017-07-15 MED ORDER — INSULIN ASPART 100 UNIT/ML ~~LOC~~ SOLN
0.0000 [IU] | Freq: Three times a day (TID) | SUBCUTANEOUS | Status: DC
  Administered 2017-07-15: 3 [IU] via SUBCUTANEOUS
  Administered 2017-07-15: 5 [IU] via SUBCUTANEOUS

## 2017-07-15 MED ORDER — ACETAMINOPHEN 500 MG PO TABS
1000.0000 mg | ORAL_TABLET | Freq: Four times a day (QID) | ORAL | Status: DC | PRN
  Administered 2017-07-15: 1000 mg via ORAL
  Filled 2017-07-15: qty 2

## 2017-07-15 MED ORDER — INSULIN STARTER KIT- SYRINGES (ENGLISH)
1.0000 | Freq: Once | Status: AC
  Administered 2017-07-15: 1
  Filled 2017-07-15: qty 1

## 2017-07-15 MED FILL — GABAPENTIN 300 MG CAPSULE: 300 | 30 days supply | Qty: 90 | Fill #0

## 2017-07-15 MED FILL — ?ATORVASTATIN 40MG TABLET: 40 | 30 days supply | Qty: 30 | Fill #0

## 2017-07-15 NOTE — Progress Notes (Signed)
   Subjective: Patient reports feeling much better today, he has his appetite back, no complaints of nausea/vomiting/diarrhea.  He complains of neuropathic pain in his hands and feet and says that gabapentin has worked well in the past  Objective:  Vital signs in last 24 hours: Vitals:   07/15/17 0000 07/15/17 0300 07/15/17 0400 07/15/17 0800  BP: 112/80  108/81 (!) 123/95  Pulse: (!) 108 99 89 92  Resp:      Temp: 98 F (36.7 C)  98.5 F (36.9 C)   TempSrc: Oral  Oral   SpO2: 100% 99% 100% 99%  Weight: 189 lb 9.5 oz (86 kg)     Height: 6\' 1"  (1.854 m)      Physical Exam  Constitutional: He is oriented to person, place, and time. He appears well-developed and well-nourished.  Eyes: Right eye exhibits no discharge. Left eye exhibits no discharge. No scleral icterus.  Cardiovascular: Normal rate, regular rhythm, normal heart sounds and intact distal pulses. Exam reveals no gallop and no friction rub.  No murmur heard. Pulmonary/Chest: Effort normal and breath sounds normal. No respiratory distress. He has no wheezes. He has no rales.  Abdominal: Soft. Bowel sounds are normal. He exhibits no distension and no mass. There is no tenderness. There is no guarding.  Neurological: He is alert and oriented to person, place, and time.    Assessment/Plan:  Principal Problem:   Uncontrolled type 1 diabetes mellitus with hyperglycemia (HCC) Active Problems:   AKI (acute kidney injury) (HCC)   Hyperkalemia   CAD (coronary artery disease)  DKA 2/2 uncontrolled T1DM in setting of possible viral gastroenteritis  -resolved, transitioned to subq insulin -will start pt back on gabapentin  Hyperkalemia  -resolved with correction of anion gap metabolic acidosis  AKI Prerenal from dehydration 2/2 hyperglycemia   -improved with IV fluids -latest BMP pending expect resolution  CAD Pt with reported history of DES for CAD about one year prior per patient  -continue aspirin -will start pt  on atorvastatin 40mg  -bp within normal range during stay    Dispo: Anticipated discharge home this afternoon as pt now medically stable, eating and drinking well.   , MD 07/15/2017, 11:01 AM Angelita Ingles MD PGY-1 Internal Medicine Pager # [phone]

## 2017-07-15 NOTE — Progress Notes (Signed)
Pt c/o throat and neck pain radiating down to his chest states that it "stings" when he swallows. Primary team made aware. All VSS stable, will continue to monitor at this time.

## 2017-07-15 NOTE — Progress Notes (Signed)
Inpatient Diabetes Program Recommendations  AACE/ADA: New Consensus Statement on Inpatient Glycemic Control (2015)  Target Ranges:  Prepandial:   less than 140 mg/dL      Peak postprandial:   less than 180 mg/dL (1-2 hours)      Critically ill patients:  140 - 180 mg/dL   Lab Results  Component Value Date   GLUCAP 160 (H) 07/15/2017   HGBA1C 9.6 06/25/2017    Review of Glycemic Control  Diabetes history: DM Outpatient Diabetes medications: 70/30 25 units bid, Novolog 5 units tid Current orders for Inpatient glycemic control: Novolog 0-15 units tidwc + 70/30 20 units bid  Inpatient Diabetes Program Recommendations:   Spoke with patient @ bedside. Patient states he missed both doses on insulin on Saturday and one dose yesterday. Reviewed sick day rules with patient to check CBGs more often and drink fluids along with taking insulin. Patient acknowledges understanding. Patient has 70/30 and Humalog insulin @ home but needs syringes.Starter kit ordered for patient to give packet of syringes to take home with him. Patient has glucometer and strips @ home. Noted A1c has improved from 12.9 in October to current A1c 9.6.  Physician please order on discharge: Insulin syringes [id number]  Thank you, Nani Gasser. Jakita Dutkiewicz, RN, MSN, CDE  Diabetes Coordinator Inpatient Glycemic Control Team Team Pager [phone] (8am-5pm) 07/15/2017 12:51 PM

## 2017-07-15 NOTE — Care Management Note (Addendum)
Case Management Note  Patient Details  Name: Matthew Riley MRN: [id number] Date of Birth: [birth year]  Subjective/Objective:    Pt admitted with DKA                Action/Plan:  Pt is independent from home with sister, recently set up with [facility] for [facility] via charity (CM verified this with [facility]). Resumption orders written and agency made aware of discharge home today.  Pt has cane in the home. Pt is active with [facility] and gets his prescriptions filled there.  Pt states he has insulin syringes waiting to be picked up at [facility]. Pt states he will travel home via private vehicle driven by his niece.       Expected Discharge Date:  07/15/17               Expected Discharge Plan:  Home w Home Health Services  In-House Referral:     Discharge planning Services  CM Consult  Post Acute Care Choice:  Resumption of Svcs/PTA Provider Choice offered to:  Patient  DME Arranged:    DME Agency:     HH Arranged:  RN HH Agency:  [facility]  Status of Service:     If discussed at Long Length of Stay Meetings, dates discussed:    Additional Comments:  Cherylann Parr, RN 07/15/2017, 2:35 PM

## 2017-07-15 NOTE — Progress Notes (Signed)
Internal Medicine Attending  Date: 07/15/2017  Patient name: Matthew Riley Medical record number: [id number] Date of birth: 1976/04/22 Age: 42 y.o. Gender: male  I saw and evaluated the patient. I reviewed the resident's note by Dr. Frances Furbish and I agree with the resident's findings and plans as documented in his progress note.  Please see my H&P dated 07/15/2017 and attached to Dr. Godfrey Pick H&P dated 07/14/2017 for the specifics of my evaluation, assessment, and plan from earlier in the day.

## 2017-07-16 NOTE — Telephone Encounter (Signed)
Pt called to request -Indonethacin 50 mg Please follow up

## 2017-07-17 ENCOUNTER — Telehealth: Payer: Self-pay
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
## 2017-07-18 ENCOUNTER — Other Ambulatory Visit: Payer: Self-pay | Admitting: Family Medicine

## 2017-07-18 DIAGNOSIS — M1990 Unspecified osteoarthritis, unspecified site: Secondary | ICD-10-CM

## 2017-07-18 DIAGNOSIS — M0579 Rheumatoid arthritis with rheumatoid factor of multiple sites without organ or systems involvement: Secondary | ICD-10-CM

## 2017-07-18 MED ORDER — TRAMADOL HCL 50 MG PO TABS: 50.0000 mg | ORAL_TABLET | Freq: Two times a day (BID) | ORAL | 0 refills | Status: AC | PRN

## 2017-07-18 NOTE — Telephone Encounter (Signed)
Indomethacin will not be refilled. If patient is experiencing  pain would recommend tramadol based on recent history and labs, if patient is agreeable. Recommend that keep follow up appointment to address medications. Script will be available for pick up today.

## 2017-07-18 NOTE — Telephone Encounter (Signed)
CMA called patient to inform script is ready for him to be pick up today.  Patient understood.

## 2017-07-18 NOTE — Telephone Encounter (Signed)
Attempt to contact patient to inform: Indomethacin will not be refilled. If patient is experiencing  pain would recommend tramadol based on recent history and labs, if patient is agreeable. Recommend that keep follow up appointment to address medications. Script will be available for pick up today.  Left message on voicemail to return call.

## 2017-07-19 ENCOUNTER — Telehealth: Payer: Self-pay | Admitting: Family Medicine

## 2017-07-19 DIAGNOSIS — I10 Essential (primary) hypertension: Secondary | ICD-10-CM

## 2017-07-19 NOTE — Telephone Encounter (Signed)
Call placed to [facility] #[phone] regarding patient's home health services. Spoke with Britta Mccreedy and she confirmed that nurse is seeing patient currently and will do so until March 9. Also, nurse wants patient to receive physical therapy.

## 2017-07-22 ENCOUNTER — Telehealth: Payer: Self-pay

## 2017-07-22 ENCOUNTER — Ambulatory Visit: Payer: Medicaid Other | Attending: Family Medicine | Admitting: Family Medicine

## 2017-07-22 ENCOUNTER — Encounter: Payer: Self-pay | Admitting: Family Medicine

## 2017-07-22 VITALS — BP 128/95 | HR 107 | Temp 97.8°F | Ht 73.0 in | Wt 209.0 lb

## 2017-07-22 DIAGNOSIS — E1040 Type 1 diabetes mellitus with diabetic neuropathy, unspecified: Secondary | ICD-10-CM | POA: Insufficient documentation

## 2017-07-22 DIAGNOSIS — K529 Noninfective gastroenteritis and colitis, unspecified: Secondary | ICD-10-CM | POA: Diagnosis not present

## 2017-07-22 DIAGNOSIS — Z7982 Long term (current) use of aspirin: Secondary | ICD-10-CM | POA: Diagnosis not present

## 2017-07-22 DIAGNOSIS — E1065 Type 1 diabetes mellitus with hyperglycemia: Secondary | ICD-10-CM | POA: Diagnosis present

## 2017-07-22 DIAGNOSIS — I69349 Monoplegia of lower limb following cerebral infarction affecting unspecified side: Secondary | ICD-10-CM | POA: Insufficient documentation

## 2017-07-22 DIAGNOSIS — E1042 Type 1 diabetes mellitus with diabetic polyneuropathy: Secondary | ICD-10-CM

## 2017-07-22 DIAGNOSIS — K219 Gastro-esophageal reflux disease without esophagitis: Secondary | ICD-10-CM

## 2017-07-22 DIAGNOSIS — E875 Hyperkalemia: Secondary | ICD-10-CM | POA: Diagnosis not present

## 2017-07-22 DIAGNOSIS — M545 Low back pain: Secondary | ICD-10-CM | POA: Diagnosis not present

## 2017-07-22 DIAGNOSIS — G8929 Other chronic pain: Secondary | ICD-10-CM | POA: Insufficient documentation

## 2017-07-22 DIAGNOSIS — M069 Rheumatoid arthritis, unspecified: Secondary | ICD-10-CM | POA: Diagnosis not present

## 2017-07-22 DIAGNOSIS — Z8673 Personal history of transient ischemic attack (TIA), and cerebral infarction without residual deficits: Secondary | ICD-10-CM

## 2017-07-22 DIAGNOSIS — T22012A Burn of unspecified degree of left forearm, initial encounter: Secondary | ICD-10-CM | POA: Diagnosis not present

## 2017-07-22 DIAGNOSIS — I2510 Atherosclerotic heart disease of native coronary artery without angina pectoris: Secondary | ICD-10-CM | POA: Insufficient documentation

## 2017-07-22 DIAGNOSIS — Z955 Presence of coronary angioplasty implant and graft: Secondary | ICD-10-CM | POA: Diagnosis not present

## 2017-07-22 DIAGNOSIS — T22112A Burn of first degree of left forearm, initial encounter: Secondary | ICD-10-CM

## 2017-07-22 DIAGNOSIS — Z794 Long term (current) use of insulin: Secondary | ICD-10-CM | POA: Insufficient documentation

## 2017-07-22 DIAGNOSIS — X58XXXA Exposure to other specified factors, initial encounter: Secondary | ICD-10-CM | POA: Insufficient documentation

## 2017-07-22 DIAGNOSIS — B1910 Unspecified viral hepatitis B without hepatic coma: Secondary | ICD-10-CM | POA: Diagnosis not present

## 2017-07-22 DIAGNOSIS — Z79899 Other long term (current) drug therapy: Secondary | ICD-10-CM | POA: Insufficient documentation

## 2017-07-22 DIAGNOSIS — I10 Essential (primary) hypertension: Secondary | ICD-10-CM | POA: Diagnosis not present

## 2017-07-22 LAB — GLUCOSE, POCT (MANUAL RESULT ENTRY): POC GLUCOSE: 60 mg/dL — AB (ref 70–99)

## 2017-07-22 MED ORDER — INSULIN ASPART 100 UNIT/ML ~~LOC~~ SOLN: 5.0000 [IU] | Freq: Three times a day (TID) | SUBCUTANEOUS | 3 refills | Status: DC

## 2017-07-22 MED ORDER — SILVER SULFADIAZINE 1 % EX CREA: 1.0000 "application " | TOPICAL_CREAM | Freq: Every day | CUTANEOUS | 0 refills | Status: AC

## 2017-07-22 MED ORDER — INSULIN ASPART PROT & ASPART (70-30 MIX) 100 UNIT/ML ~~LOC~~ SUSP: 28.0000 [IU] | Freq: Every day | SUBCUTANEOUS | 2 refills | Status: DC

## 2017-07-22 MED ORDER — PANTOPRAZOLE SODIUM 40 MG PO TBEC: 40.0000 mg | DELAYED_RELEASE_TABLET | Freq: Every day | ORAL | 2 refills | Status: DC

## 2017-07-22 MED ORDER — GABAPENTIN 300 MG PO CAPS: 300.0000 mg | ORAL_CAPSULE | Freq: Three times a day (TID) | ORAL | 0 refills | Status: DC

## 2017-07-22 MED ORDER — PROMETHAZINE HCL 25 MG PO TABS: 25.0000 mg | ORAL_TABLET | Freq: Three times a day (TID) | ORAL | 0 refills | Status: DC | PRN

## 2017-07-22 MED ORDER — ATORVASTATIN CALCIUM 40 MG PO TABS: 40.0000 mg | ORAL_TABLET | Freq: Every day | ORAL | 2 refills | Status: DC

## 2017-07-22 MED FILL — !NOVOLOG MIX 70/30 VIAL: 70-30/ML | 28 days supply | Qty: 10 | Fill #0

## 2017-07-22 MED FILL — SSD 1% CREAM: 1 | 25 days supply | Qty: 50 | Fill #0

## 2017-07-22 MED FILL — !NOVOLOG 100UNITS/ML VIAL: 100/ML | 28 days supply | Qty: 10 | Fill #0

## 2017-07-22 MED FILL — traMADol HCL 50 MG TABS: 50 | 20 days supply | Qty: 40 | Fill #0

## 2017-07-22 MED FILL — PROMETHAZINE 25 MG TABLET: 25 | 6 days supply | Qty: 20 | Fill #0

## 2017-07-22 NOTE — Patient Instructions (Signed)
Burn Care, Adult A burn is an injury to the skin or the tissues under the skin. There are three types of burns:  First degree. These burns may cause the skin to be red and a bit swollen.  Second degree. These burns are very painful and cause the skin to be very red. The skin may also leak fluid, look shiny, and start to have blisters.  Third degree. These burns cause permanent damage. They turn the skin white or black and make it look charred, dry, and leathery.  Taking care of your burn properly can help to prevent pain and infection. It can also help the burn to heal more quickly. How is this treated? Right after a burn:  Rinse or soak the burn under cool water. Do this for several minutes. Do not put ice on your burn. That can cause more damage.  Lightly cover the burn with a clean (sterile) cloth (dressing). Burn care  Raise (elevate) the injured area above the level of your heart while sitting or lying down.  Follow instructions from your doctor about: ? How to clean and take care of the burn. ? When to change and remove the cloth.  Check your burn every day for signs of infection. Check for: ? More redness, swelling, or pain. ? Warmth. ? Pus or a bad smell. Medicine   Take over-the-counter and prescription medicines only as told by your doctor.  If you were prescribed antibiotic medicine, take or apply it as told by your doctor. Do not stop using the antibiotic even if your condition improves. General instructions  To prevent infection: ? Do not put butter, oil, or other home treatments on the burn. ? Do not scratch or pick at the burn. ? Do not break any blisters. ? Do not peel skin.  Do not rub your burn, even when you are cleaning it.  Protect your burn from the sun. Contact a doctor if:  Your condition does not get better.  Your condition gets worse.  You have a fever.  Your burn looks different or starts to have black or red spots on it.  Your burn  feels warm to the touch.  Your pain is not controlled with medicine. Get help right away if:  You have redness, swelling, or pain at the site of the burn.  You have fluid, blood, or pus coming from your burn.  You have red streaks near the burn.  You have very bad pain. This information is not intended to replace advice given to you by your health care provider. Make sure you discuss any questions you have with your health care provider. Document Released: 02/28/2008 Document Revised: 07/07/2016 Document Reviewed: 11/08/2015 Elsevier Interactive Patient Education  2018 Elsevier Inc.  
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
## 2017-07-22 NOTE — Telephone Encounter (Signed)
Met with the patient when he was in the clinic today for his appointment. He stated that his daughter drive him to the clinic can provide some assistance with food shopping when needed.  He noted that his home health nurse will be seeing him at 36 today and assists him with managing his medications.  This CM provided him with a sharps container for his insulin needles and lancets. This CM also reminded him to call [facility] to schedule his transportation assessment. His application had been submitted and he said he was in the hospital when he was contacted for the assessment.

## 2017-07-23 ENCOUNTER — Telehealth: Payer: Self-pay | Admitting: Family Medicine

## 2017-07-23 LAB — CMP14+EGFR
A/G RATIO: 0.6 — AB (ref 1.2–2.2)
ALK PHOS: 130 IU/L — AB (ref 39–117)
ALT: 590 IU/L (ref 0–44)
AST: 795 IU/L — AB (ref 0–40)
Albumin: 3.2 g/dL — ABNORMAL LOW (ref 3.5–5.5)
BUN/Creatinine Ratio: 16 (ref 9–20)
BUN: 17 mg/dL (ref 6–24)
Bilirubin Total: 0.9 mg/dL (ref 0.0–1.2)
CO2: 27 mmol/L (ref 20–29)
Calcium: 9 mg/dL (ref 8.7–10.2)
Chloride: 96 mmol/L (ref 96–106)
Creatinine, Ser: 1.07 mg/dL (ref 0.76–1.27)
GFR calc Af Amer: 98 mL/min/{1.73_m2} (ref >59)
GFR calc non Af Amer: 85 mL/min/{1.73_m2} (ref >59)
GLOBULIN, TOTAL: 5.3 g/dL — AB (ref 1.5–4.5)
Glucose: 130 mg/dL — ABNORMAL HIGH (ref 65–99)
POTASSIUM: 3.8 mmol/L (ref 3.5–5.2)
SODIUM: 134 mmol/L (ref 134–144)
Total Protein: 8.5 g/dL (ref 6.0–8.5)
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
## 2017-07-23 NOTE — Discharge Summary (Signed)
Name: Matthew Riley MRN: [id number] DOB: 26-Aug-1975 42 y.o. PCP: Alfonse Spruce, FNP  Date of Admission: 07/14/2017  6:37 PM Date of Discharge: 07/15/2017 Attending Physician: Oval Linsey  Discharge Diagnosis: 1.  Principal Problem:   Uncontrolled type 1 diabetes mellitus with hyperglycemia (HCC) Active Problems:   AKI (acute kidney injury) ([place])   Hyperkalemia   CAD (coronary artery disease) Diabetic Ketoacidosis   Discharge Medications: Allergies as of 07/15/2017   No Known Allergies     Medication List    STOP taking these medications   guaiFENesin 600 MG 12 hr tablet Commonly known as:  MUCINEX   insulin aspart protamine- aspart (70-30) 100 UNIT/ML injection Commonly known as:  NOVOLOG MIX 70/30   ondansetron 4 MG tablet Commonly known as:  ZOFRAN     TAKE these medications   aspirin EC 81 MG tablet Take 1 tablet (81 mg total) by mouth daily.   cyclobenzaprine 5 MG tablet Commonly known as:  FLEXERIL Take 1 tablet (5 mg total) by mouth 3 (three) times daily as needed for muscle spasms.   polyethylene glycol packet Commonly known as:  MIRALAX / GLYCOLAX Take 17 g by mouth 2 (two) times daily.   thiamine 100 MG tablet Take 1 tablet (100 mg total) by mouth daily.     ASK your doctor about these medications   insulin starter kit- syringes Misc 1 kit by Other route once for 1 dose. Ask about: Should I take this medication?   living well with diabetes book Misc 1 each by Does not apply route once for 1 dose. Ask about: Should I take this medication?       Disposition and follow-up:   [name] was discharged from [facility] in Good condition.  At the hospital follow up visit please address:  Uncontrolled Type 1 DM resulting in Diabetic Ketoacidosis AKI -please work with patient to titrate insulin for improved glycemic control -ensure renal function continues to be stable and patient hydrating himself well   2.   Labs / imaging needed at time of follow-up: bmp  3.  Pending labs/ test needing follow-up: none  Follow-up Appointments: Follow-up Information    [facility]-Home Follow up.   Specialty:  Home Health Services Why:  Registered Nurse Contact information: [address] [place] [address] [phone]           Hospital Course by problem list: Principal Problem:   Uncontrolled type 1 diabetes mellitus with hyperglycemia ([place]) Active Problems:   AKI (acute kidney injury) ([place])   Hyperkalemia   CAD (coronary artery disease)   Uncontrolled Type 1 DM resulting in Diabetic Ketoacidosis AKI  Patient arrived with 2 days of nausea vomiting, headache.  Eventually retching and it was difficult for him to keep any food or liquids down.  He had an associated cough, rhinorrhea, fever and chills.   Because he was feeling so poorly he skipped his medications including his insulin.  He arrived with a serum glucose of 552 with a basic metabolic panel indicating an anion gap metabolic acidosis with a bicarb of 18 and anion gap of 19.  He was placed on the DKA protocol given an insulin drip with normal saline IV fluid resuscitation.  This was later switched to D5 normal saline once the glucose was below 250.  Once the patient had 2 BMP results that demonstrated no anion gap he was transitioned to basal bolus insulin.  The patient tolerated this well,  he was treated with antinausea medicines in the interim and the following morning felt much better.  He was able to tolerate his breakfast well was no longer having any nausea or vomiting.  Additionally he had a mild acute kidney injury when he first arrived secondary to dehydration, this subsequently improved the following day before discharge after adequate rehydration with IV fluids.  The patient was discharged with close PCP follow-up to help further titrate his insulin and further instruct him on what to do if he does become ill in  regards to taking his insulin.   Discharge Vitals:   BP (!) 123/95   Pulse 92   Temp 97.6 F (36.4 C) (Oral)   Resp 19   Ht '6\' 1"'  (1.854 m)   Wt 189 lb 9.5 oz (86 kg)   SpO2 99%   BMI 25.01 kg/m   Pertinent Labs, Studies, and Procedures:  CBC Latest Ref Rng & Units 07/15/2017 07/14/2017 06/25/2017  WBC 4.0 - 10.5 K/uL 19.3(H) 16.7(H) 7.8  Hemoglobin 13.0 - 17.0 g/dL 12.3(L) 13.3 11.4(L)  Hematocrit 39.0 - 52.0 % 34.7(L) 38.3(L) 35.2(L)  Platelets 150 - 400 K/uL 277 272 485(H)   BMP Latest Ref Rng & Units 07/22/2017 07/15/2017 07/15/2017  Glucose 65 - 99 mg/dL 130(H) 249(H) 134(H)  BUN 6 - 24 mg/dL 17 30(H) 32(H)  Creatinine 0.76 - 1.27 mg/dL 1.07 1.30(H) 1.42(H)  BUN/Creat Ratio 9 - 20 16 - -  Sodium 134 - 144 mmol/L 134 135 137  Potassium 3.5 - 5.2 mmol/L 3.8 4.7 4.0  Chloride 96 - 106 mmol/L 96 101 102  CO2 20 - 29 mmol/L '27 24 25  ' Calcium 8.7 - 10.2 mg/dL 9.0 8.4(L) 8.6(L)    Discharge Instructions: Discharge Instructions    Diet - low sodium heart healthy   Complete by:  As directed    Discharge instructions   Complete by:  As directed    Pleasure taking care of you. Please continue taking your medicine including insulin as directed. Please follow-up with your primary care within 1 week.   Increase activity slowly   Complete by:  As directed       Signed: Katherine Roan, MD 07/23/2017, 3:31 PM

## 2017-07-25 ENCOUNTER — Ambulatory Visit: Payer: Self-pay | Admitting: Family Medicine

## 2017-07-29 ENCOUNTER — Other Ambulatory Visit: Payer: Self-pay | Admitting: Pharmacist

## 2017-07-29 ENCOUNTER — Ambulatory Visit: Payer: Self-pay | Attending: Family Medicine

## 2017-07-29 MED ORDER — PNEUMOCOCCAL VAC POLYVALENT 25 MCG/0.5ML IJ INJ: 0.5000 mL | INJECTION | Freq: Once | INTRAMUSCULAR | 0 refills | Status: AC

## 2017-07-30 ENCOUNTER — Telehealth: Payer: Self-pay | Admitting: Family Medicine

## 2017-07-30 NOTE — Telephone Encounter (Signed)
2page, Paperwork received 07/30/17.

## 2017-08-02 ENCOUNTER — Telehealth: Payer: Self-pay | Admitting: Family Medicine

## 2017-08-02 DIAGNOSIS — E1110 Type 2 diabetes mellitus with ketoacidosis without coma: Secondary | ICD-10-CM

## 2017-08-02 HISTORY — DX: Type 2 diabetes mellitus with ketoacidosis without coma: E11.10

## 2017-08-02 MED FILL — $PNEUMOVAX 23 VIAL: 25 | 1 days supply | Qty: 1 | Fill #0

## 2017-08-02 NOTE — Telephone Encounter (Signed)
Call placed to patient #[phone] to check on his status and schedule a follow up appointment with provider. No answer. Left patient a message asking him to return my call at [phone].

## 2017-08-02 NOTE — Telephone Encounter (Signed)
Spoke with patient after I missed his call. Patient stated that he is doing good and being compliant with taking his medication. Scheduled a follow up appointment for Friday 08/30/17 at 10:30am. Patient had no questions or concerns at the time.

## 2017-08-12 ENCOUNTER — Telehealth: Payer: Self-pay | Admitting: Internal Medicine

## 2017-08-12 NOTE — Telephone Encounter (Signed)
2 page, paperwork received through fax 08-12-17.  °

## 2017-08-15 ENCOUNTER — Other Ambulatory Visit: Payer: Self-pay | Admitting: Family Medicine

## 2017-08-15 DIAGNOSIS — K529 Noninfective gastroenteritis and colitis, unspecified: Secondary | ICD-10-CM

## 2017-08-15 MED FILL — ATORVASTATIN 40 MG TABLET: 40 | 30 days supply | Qty: 30 | Fill #0

## 2017-08-15 MED FILL — GABAPENTIN 300 MG CAPSULE: 300 | 30 days supply | Qty: 90 | Fill #0

## 2017-08-30 ENCOUNTER — Other Ambulatory Visit: Payer: Self-pay

## 2017-08-30 ENCOUNTER — Emergency Department (HOSPITAL_COMMUNITY): Payer: Medicaid Other

## 2017-08-30 ENCOUNTER — Inpatient Hospital Stay (HOSPITAL_COMMUNITY): Payer: Medicaid Other

## 2017-08-30 ENCOUNTER — Ambulatory Visit: Payer: Self-pay | Admitting: Family Medicine

## 2017-08-30 ENCOUNTER — Inpatient Hospital Stay (HOSPITAL_COMMUNITY)
Admission: EM | Admit: 2017-08-30 | Discharge: 2017-09-04 | DRG: 637 | Disposition: A | Discharge status: Home Health | Payer: Medicaid Other | Attending: Student in an Organized Health Care Education/Training Program | Admitting: Student in an Organized Health Care Education/Training Program

## 2017-08-30 ENCOUNTER — Encounter (HOSPITAL_COMMUNITY): Payer: Self-pay

## 2017-08-30 DIAGNOSIS — R410 Disorientation, unspecified: Secondary | ICD-10-CM | POA: Diagnosis not present

## 2017-08-30 DIAGNOSIS — E872 Acidosis, unspecified: Secondary | ICD-10-CM

## 2017-08-30 DIAGNOSIS — Z955 Presence of coronary angioplasty implant and graft: Secondary | ICD-10-CM

## 2017-08-30 DIAGNOSIS — Z8673 Personal history of transient ischemic attack (TIA), and cerebral infarction without residual deficits: Secondary | ICD-10-CM | POA: Diagnosis not present

## 2017-08-30 DIAGNOSIS — I10 Essential (primary) hypertension: Secondary | ICD-10-CM | POA: Diagnosis not present

## 2017-08-30 DIAGNOSIS — I959 Hypotension, unspecified: Secondary | ICD-10-CM | POA: Diagnosis present

## 2017-08-30 DIAGNOSIS — L818 Other specified disorders of pigmentation: Secondary | ICD-10-CM | POA: Diagnosis not present

## 2017-08-30 DIAGNOSIS — E10649 Type 1 diabetes mellitus with hypoglycemia without coma: Secondary | ICD-10-CM | POA: Diagnosis present

## 2017-08-30 DIAGNOSIS — I2510 Atherosclerotic heart disease of native coronary artery without angina pectoris: Secondary | ICD-10-CM | POA: Diagnosis present

## 2017-08-30 DIAGNOSIS — B1910 Unspecified viral hepatitis B without hepatic coma: Secondary | ICD-10-CM | POA: Diagnosis present

## 2017-08-30 DIAGNOSIS — E860 Dehydration: Secondary | ICD-10-CM | POA: Diagnosis present

## 2017-08-30 DIAGNOSIS — F1011 Alcohol abuse, in remission: Secondary | ICD-10-CM | POA: Diagnosis not present

## 2017-08-30 DIAGNOSIS — F329 Major depressive disorder, single episode, unspecified: Secondary | ICD-10-CM | POA: Diagnosis not present

## 2017-08-30 DIAGNOSIS — G9340 Encephalopathy, unspecified: Secondary | ICD-10-CM | POA: Diagnosis not present

## 2017-08-30 DIAGNOSIS — I5022 Chronic systolic (congestive) heart failure: Secondary | ICD-10-CM | POA: Diagnosis not present

## 2017-08-30 DIAGNOSIS — E1310 Other specified diabetes mellitus with ketoacidosis without coma: Secondary | ICD-10-CM | POA: Diagnosis not present

## 2017-08-30 DIAGNOSIS — Z91120 Patient's intentional underdosing of medication regimen due to financial hardship: Secondary | ICD-10-CM

## 2017-08-30 DIAGNOSIS — E876 Hypokalemia: Secondary | ICD-10-CM | POA: Diagnosis not present

## 2017-08-30 DIAGNOSIS — N179 Acute kidney failure, unspecified: Secondary | ICD-10-CM | POA: Diagnosis present

## 2017-08-30 DIAGNOSIS — T383X6A Underdosing of insulin and oral hypoglycemic [antidiabetic] drugs, initial encounter: Secondary | ICD-10-CM | POA: Diagnosis present

## 2017-08-30 DIAGNOSIS — I110 Hypertensive heart disease with heart failure: Secondary | ICD-10-CM | POA: Diagnosis present

## 2017-08-30 DIAGNOSIS — E1010 Type 1 diabetes mellitus with ketoacidosis without coma: Secondary | ICD-10-CM | POA: Diagnosis not present

## 2017-08-30 DIAGNOSIS — E1042 Type 1 diabetes mellitus with diabetic polyneuropathy: Secondary | ICD-10-CM | POA: Diagnosis not present

## 2017-08-30 DIAGNOSIS — I340 Nonrheumatic mitral (valve) insufficiency: Secondary | ICD-10-CM | POA: Diagnosis not present

## 2017-08-30 DIAGNOSIS — B181 Chronic viral hepatitis B without delta-agent: Secondary | ICD-10-CM | POA: Diagnosis present

## 2017-08-30 DIAGNOSIS — Z79899 Other long term (current) drug therapy: Secondary | ICD-10-CM

## 2017-08-30 DIAGNOSIS — F1290 Cannabis use, unspecified, uncomplicated: Secondary | ICD-10-CM | POA: Diagnosis present

## 2017-08-30 DIAGNOSIS — R740 Nonspecific elevation of levels of transaminase and lactic acid dehydrogenase [LDH]: Secondary | ICD-10-CM | POA: Diagnosis not present

## 2017-08-30 DIAGNOSIS — E875 Hyperkalemia: Secondary | ICD-10-CM | POA: Diagnosis present

## 2017-08-30 DIAGNOSIS — G9341 Metabolic encephalopathy: Secondary | ICD-10-CM | POA: Diagnosis not present

## 2017-08-30 DIAGNOSIS — Z7982 Long term (current) use of aspirin: Secondary | ICD-10-CM

## 2017-08-30 DIAGNOSIS — E1110 Type 2 diabetes mellitus with ketoacidosis without coma: Secondary | ICD-10-CM | POA: Diagnosis not present

## 2017-08-30 DIAGNOSIS — Z833 Family history of diabetes mellitus: Secondary | ICD-10-CM

## 2017-08-30 DIAGNOSIS — Z87891 Personal history of nicotine dependence: Secondary | ICD-10-CM | POA: Diagnosis not present

## 2017-08-30 DIAGNOSIS — K219 Gastro-esophageal reflux disease without esophagitis: Secondary | ICD-10-CM | POA: Diagnosis present

## 2017-08-30 DIAGNOSIS — M069 Rheumatoid arthritis, unspecified: Secondary | ICD-10-CM | POA: Diagnosis present

## 2017-08-30 DIAGNOSIS — Z9119 Patient's noncompliance with other medical treatment and regimen: Secondary | ICD-10-CM

## 2017-08-30 DIAGNOSIS — E1065 Type 1 diabetes mellitus with hyperglycemia: Secondary | ICD-10-CM

## 2017-08-30 DIAGNOSIS — Z794 Long term (current) use of insulin: Secondary | ICD-10-CM | POA: Diagnosis not present

## 2017-08-30 DIAGNOSIS — R4182 Altered mental status, unspecified: Secondary | ICD-10-CM | POA: Diagnosis not present

## 2017-08-30 DIAGNOSIS — R0603 Acute respiratory distress: Secondary | ICD-10-CM | POA: Diagnosis not present

## 2017-08-30 HISTORY — DX: Type 2 diabetes mellitus with ketoacidosis without coma: E11.10

## 2017-08-30 LAB — CBG MONITORING, ED
Glucose-Capillary: >600 mg/dL (ref 65–99)
Glucose-Capillary: >600 mg/dL (ref 65–99)

## 2017-08-30 LAB — I-STAT VENOUS BLOOD GAS, ED
ACID-BASE EXCESS: 17 mmol/L — AB (ref 0.0–2.0)
Bicarbonate: 45.3 mmol/L — ABNORMAL HIGH (ref 20.0–28.0)
O2 SAT: 81 %
PCO2 VEN: 74.7 mmHg — AB (ref 44.0–60.0)
PH VEN: 7.391 (ref 7.250–7.430)
TCO2: 48 mmol/L — ABNORMAL HIGH (ref 22–32)
pO2, Ven: 48 mmHg — ABNORMAL HIGH (ref 32.0–45.0)

## 2017-08-30 LAB — URINALYSIS, ROUTINE W REFLEX MICROSCOPIC
Bacteria, UA: NONE SEEN
Bilirubin Urine: NEGATIVE
Ketones, ur: 80 mg/dL — AB
Leukocytes, UA: NEGATIVE
Nitrite: NEGATIVE
PH: 5 (ref 5.0–8.0)
Protein, ur: NEGATIVE mg/dL
SPECIFIC GRAVITY, URINE: 1.022 (ref 1.005–1.030)

## 2017-08-30 LAB — I-STAT CHEM 8, ED
BUN: 61 mg/dL — ABNORMAL HIGH (ref 6–20)
BUN: 59 mg/dL — ABNORMAL HIGH (ref 6–20)
CALCIUM ION: 0.85 mmol/L — AB (ref 1.15–1.40)
Calcium, Ion: 1.1 mmol/L — ABNORMAL LOW (ref 1.15–1.40)
Chloride: 90 mmol/L — ABNORMAL LOW (ref 101–111)
Chloride: 100 mmol/L — ABNORMAL LOW (ref 101–111)
Creatinine, Ser: 2.5 mg/dL — ABNORMAL HIGH (ref 0.61–1.24)
Creatinine, Ser: 2.3 mg/dL — ABNORMAL HIGH (ref 0.61–1.24)
Glucose, Bld: >700 mg/dL (ref 65–99)
HCT: 40 % (ref 39.0–52.0)
HEMATOCRIT: 42 % (ref 39.0–52.0)
HEMOGLOBIN: 14.3 g/dL (ref 13.0–17.0)
HEMOGLOBIN: 13.6 g/dL (ref 13.0–17.0)
Potassium: 6.1 mmol/L — ABNORMAL HIGH (ref 3.5–5.1)
Potassium: 4.3 mmol/L (ref 3.5–5.1)
SODIUM: 129 mmol/L — AB (ref 135–145)
SODIUM: 139 mmol/L (ref 135–145)
TCO2: 12 mmol/L — AB (ref 22–32)
TCO2: 12 mmol/L — AB (ref 22–32)

## 2017-08-30 LAB — I-STAT ARTERIAL BLOOD GAS, ED
Acid-base deficit: 15 mmol/L — ABNORMAL HIGH (ref 0.0–2.0)
Bicarbonate: 9.3 mmol/L — ABNORMAL LOW (ref 20.0–28.0)
O2 SAT: 99 %
PH ART: 7.305 — AB (ref 7.350–7.450)
TCO2: 10 mmol/L — AB (ref 22–32)
pCO2 arterial: 18.6 mmHg — CL (ref 32.0–48.0)
pO2, Arterial: 140 mmHg — ABNORMAL HIGH (ref 83.0–108.0)

## 2017-08-30 LAB — BASIC METABOLIC PANEL
ANION GAP: 33 — AB (ref 5–15)
ANION GAP: 22 — AB (ref 5–15)
BUN: 79 mg/dL — ABNORMAL HIGH (ref 6–20)
BUN: 73 mg/dL — ABNORMAL HIGH (ref 6–20)
CALCIUM: 8.5 mg/dL — AB (ref 8.9–10.3)
CHLORIDE: 82 mmol/L — AB (ref 101–111)
CO2: 11 mmol/L — ABNORMAL LOW (ref 22–32)
CO2: 18 mmol/L — ABNORMAL LOW (ref 22–32)
CREATININE: 3.62 mg/dL — AB (ref 0.61–1.24)
Calcium: 7.6 mg/dL — ABNORMAL LOW (ref 8.9–10.3)
Chloride: 98 mmol/L — ABNORMAL LOW (ref 101–111)
Creatinine, Ser: 3.08 mg/dL — ABNORMAL HIGH (ref 0.61–1.24)
GFR calc Af Amer: 27 mL/min — ABNORMAL LOW (ref >60)
GFR calc non Af Amer: 19 mL/min — ABNORMAL LOW (ref >60)
GFR calc non Af Amer: 23 mL/min — ABNORMAL LOW (ref >60)
GFR, EST AFRICAN AMERICAN: 22 mL/min — AB (ref >60)
GLUCOSE: 629 mg/dL — AB (ref 65–99)
Glucose, Bld: 1273 mg/dL (ref 65–99)
POTASSIUM: 6.1 mmol/L — AB (ref 3.5–5.1)
Potassium: 3.1 mmol/L — ABNORMAL LOW (ref 3.5–5.1)
SODIUM: 126 mmol/L — AB (ref 135–145)
SODIUM: 138 mmol/L (ref 135–145)

## 2017-08-30 LAB — RAPID URINE DRUG SCREEN, HOSP PERFORMED
Amphetamines: NOT DETECTED
BARBITURATES: NOT DETECTED
BENZODIAZEPINES: NOT DETECTED
Cocaine: NOT DETECTED
Opiates: NOT DETECTED
Tetrahydrocannabinol: POSITIVE — AB

## 2017-08-30 LAB — I-STAT TROPONIN, ED: Troponin i, poc: 0.02 ng/mL (ref 0.00–0.08)

## 2017-08-30 LAB — HEPATIC FUNCTION PANEL
ALBUMIN: 2.4 g/dL — AB (ref 3.5–5.0)
ALK PHOS: 157 U/L — AB (ref 38–126)
ALT: 261 U/L — ABNORMAL HIGH (ref 17–63)
AST: 201 U/L — ABNORMAL HIGH (ref 15–41)
BILIRUBIN DIRECT: 0.6 mg/dL — AB (ref 0.1–0.5)
BILIRUBIN TOTAL: 2.5 mg/dL — AB (ref 0.3–1.2)
Indirect Bilirubin: 1.9 mg/dL — ABNORMAL HIGH (ref 0.3–0.9)
Total Protein: 8.5 g/dL — ABNORMAL HIGH (ref 6.5–8.1)

## 2017-08-30 LAB — BETA-HYDROXYBUTYRIC ACID

## 2017-08-30 LAB — CBC
HCT: 38.2 % — ABNORMAL LOW (ref 39.0–52.0)
HEMOGLOBIN: 11.5 g/dL — AB (ref 13.0–17.0)
MCH: 29.5 pg (ref 26.0–34.0)
MCHC: 30.1 g/dL (ref 30.0–36.0)
MCV: 97.9 fL (ref 78.0–100.0)
PLATELETS: 248 10*3/uL (ref 150–400)
RBC: 3.9 MIL/uL — AB (ref 4.22–5.81)
RDW: 16.5 % — ABNORMAL HIGH (ref 11.5–15.5)
WBC: 25 10*3/uL — AB (ref 4.0–10.5)

## 2017-08-30 LAB — ETHANOL: Alcohol, Ethyl (B): <10 mg/dL (ref <10)

## 2017-08-30 LAB — PROCALCITONIN: Procalcitonin: 1.15 ng/mL

## 2017-08-30 LAB — TROPONIN I
TROPONIN I: 0.05 ng/mL — AB (ref <0.03)
TROPONIN I: 0.05 ng/mL — AB (ref <0.03)

## 2017-08-30 LAB — LACTIC ACID, PLASMA: LACTIC ACID, VENOUS: 3 mmol/L — AB (ref 0.5–1.9)

## 2017-08-30 LAB — I-STAT CG4 LACTIC ACID, ED: LACTIC ACID, VENOUS: 6.07 mmol/L — AB (ref 0.5–1.9)

## 2017-08-30 LAB — AMMONIA: AMMONIA: 128 umol/L — AB (ref 9–35)

## 2017-08-30 MED ORDER — SODIUM CHLORIDE 0.9 % IV BOLUS
1000.0000 mL | Freq: Once | INTRAVENOUS | Status: AC
  Administered 2017-08-30: 1000 mL via INTRAVENOUS

## 2017-08-30 MED ORDER — THIAMINE HCL 100 MG/ML IJ SOLN
100.0000 mg | Freq: Every day | INTRAMUSCULAR | Status: DC
  Administered 2017-08-30 – 2017-09-02 (×4): 100 mg via INTRAVENOUS
  Filled 2017-08-30 (×2): qty 2
  Filled 2017-08-30 (×2): qty 1
  Filled 2017-08-30: qty 2

## 2017-08-30 MED ORDER — CALCIUM CHLORIDE 10 % IV SOLN
1.0000 g | Freq: Once | INTRAVENOUS | Status: AC
  Administered 2017-08-30: 1 g via INTRAVENOUS

## 2017-08-30 MED ORDER — HEPARIN SODIUM (PORCINE) 5000 UNIT/ML IJ SOLN
5000.0000 [IU] | Freq: Three times a day (TID) | INTRAMUSCULAR | Status: DC
  Administered 2017-08-30 – 2017-09-04 (×14): 5000 [IU] via SUBCUTANEOUS
  Filled 2017-08-30 (×16): qty 1

## 2017-08-30 MED ORDER — LORAZEPAM 2 MG/ML IJ SOLN
INTRAMUSCULAR | Status: AC
  Administered 2017-08-30: 2 mg
  Filled 2017-08-30: qty 1

## 2017-08-30 MED ORDER — SODIUM CHLORIDE 0.9 % IV SOLN
1.0000 g | Freq: Once | INTRAVENOUS | Status: DC
  Filled 2017-08-30: qty 10

## 2017-08-30 MED ORDER — SODIUM CHLORIDE 0.9 % IV SOLN
250.0000 mL | INTRAVENOUS | Status: DC | PRN
  Administered 2017-08-30: 1000 mL via INTRAVENOUS

## 2017-08-30 MED ORDER — KETAMINE HCL 50 MG/ML IJ SOLN
90.0000 mg | Freq: Once | INTRAMUSCULAR | Status: AC
  Administered 2017-08-30: 90 mg via INTRAMUSCULAR
  Filled 2017-08-30: qty 10

## 2017-08-30 MED ORDER — PIPERACILLIN-TAZOBACTAM 3.375 G IVPB
3.3750 g | Freq: Three times a day (TID) | INTRAVENOUS | Status: DC
  Administered 2017-08-31 (×2): 3.375 g via INTRAVENOUS
  Filled 2017-08-30 (×2): qty 50

## 2017-08-30 MED ORDER — SODIUM CHLORIDE 0.9 % IV SOLN
Freq: Once | INTRAVENOUS | Status: AC
  Administered 2017-08-30: 20:00:00 via INTRAVENOUS

## 2017-08-30 MED ORDER — LORAZEPAM 2 MG/ML IJ SOLN
2.0000 mg | Freq: Once | INTRAMUSCULAR | Status: AC
  Administered 2017-08-30: 2 mg via INTRAVENOUS
  Filled 2017-08-30: qty 1

## 2017-08-30 MED ORDER — SODIUM BICARBONATE 8.4 % IV SOLN
50.0000 meq | Freq: Once | INTRAVENOUS | Status: AC
  Administered 2017-08-30: 50 meq via INTRAVENOUS

## 2017-08-30 MED ORDER — SODIUM BICARBONATE 8.4 % IV SOLN
INTRAVENOUS | Status: AC
  Administered 2017-08-30: 50 meq
  Filled 2017-08-30: qty 50

## 2017-08-30 MED ORDER — DEXTROSE-NACL 5-0.45 % IV SOLN
INTRAVENOUS | Status: DC
  Administered 2017-08-31 – 2017-09-01 (×3): via INTRAVENOUS
  Administered 2017-09-01: 150 mL via INTRAVENOUS

## 2017-08-30 MED ORDER — CALCIUM CHLORIDE 10 % IV SOLN
INTRAVENOUS | Status: AC
Start: 2017-08-30 — End: 2017-08-30
  Administered 2017-08-30: 1000 mg
  Filled 2017-08-30: qty 10

## 2017-08-30 MED ORDER — SODIUM CHLORIDE 0.9 % IV SOLN
INTRAVENOUS | Status: DC
  Administered 2017-08-30: 5.4 [IU]/h via INTRAVENOUS
  Administered 2017-08-30: 20.1 [IU]/h via INTRAVENOUS
  Administered 2017-08-31: 17.1 [IU]/h via INTRAVENOUS
  Administered 2017-08-31: 12.8 [IU]/h via INTRAVENOUS
  Filled 2017-08-30 (×3): qty 1

## 2017-08-30 MED ORDER — PIPERACILLIN-TAZOBACTAM 3.375 G IVPB 30 MIN
3.3750 g | Freq: Once | INTRAVENOUS | Status: AC
  Administered 2017-08-30: 3.375 g via INTRAVENOUS
  Filled 2017-08-30: qty 50

## 2017-08-30 NOTE — ED Provider Notes (Signed)
Pt seen on arrival with Dr. Roger Shelter.  Pt confused and agitated. Glu 1200+  Tachycardic, wide complex, Peaked T waves.  Clinical diagnosis of probable DKA with hyperkalemia.  IV access obtained.  Fluid boluses initiated.  Glucose stabilizer ordered.  Patient given calcium chloride +1 amp bicarb.  Minimal change in rhythm.  Repeat CaCl and Ns Hco3- and rhythm narrows.  Resuscitated with 4 L IV fluid.  Insulin drip initiated.  Labs show lactic acidosis and diabetic ketoacidosis.  Remains confused.  Decayed for agitation with IM ketamine, and IV Ativan 2 mg x2.  Intensive care consult.  They are here seeing the patient.  Heart rate has improved.  Rhythm stabilized.  CRITICAL CARE Performed by: Matthew Riley   Total critical care time: 79 minutes  Critical care time was exclusive of separately billable procedures and treating other patients.  Critical care was necessary to treat or prevent imminent or life-threatening deterioration.  Critical care was time spent personally by me on the following activities: development of treatment plan with patient and/or surrogate as well as nursing, discussions with consultants, evaluation of patient's response to treatment, examination of patient, obtaining history from patient or surrogate, ordering and performing treatments and interventions, ordering and review of laboratory studies, ordering and review of radiographic studies, pulse oximetry and re-evaluation of patient's condition.    Matthew Porter, MD 08/30/17 [phone]

## 2017-08-30 NOTE — H&P (Signed)
PULMONARY / CRITICAL CARE MEDICINE   Name: Matthew Riley MRN: [id number] DOB: [birth year]    ADMISSION DATE:  08/30/2017  CHIEF COMPLAINT: Altered mental status  HISTORY OF PRESENT ILLNESS:        I have essentially no history on this patient.  EMS was called and family found him in the bathroom unresponsive.  Apparently he was combative on arrival in the emergency room and given an IM dose of ketamine.  He had a wide-complex EKG and was given calcium and sodium bicarbonate.  Electrolytes subsequently came back with a blood sugar greater than 700 and an anion gap of 27 with a bicarbonate of 12. Reviewing the old chart it appears that he was admitted to this hospital in early February for DKA with discharge diagnoses of coronary artery disease and diabetes.  His discharge medicines at that time were aspirin, Flexeril, thiamine, and MiraLAX.  PAST MEDICAL HISTORY :  He  has a past medical history of Coronary artery disease, Diabetes mellitus without complication (HCC), GERD (gastroesophageal reflux disease), Hepatitis B, Hypertension, Renal disorder, Rheumatoid arthritis (HCC), and Stroke (HCC) (02/2017).  PAST SURGICAL HISTORY: He  has a past surgical history that includes Coronary angioplasty with stent.  No Known Allergies  No current facility-administered medications on file prior to encounter.    Current Outpatient Medications on File Prior to Encounter  Medication Sig  . aspirin EC 81 MG tablet Take 1 tablet (81 mg total) by mouth daily.  Marland Kitchen atorvastatin (LIPITOR) 40 MG tablet Take 1 tablet (40 mg total) by mouth daily at 6 PM.  . cyclobenzaprine (FLEXERIL) 5 MG tablet Take 1 tablet (5 mg total) by mouth 3 (three) times daily as needed for muscle spasms.  Marland Kitchen gabapentin (NEURONTIN) 300 MG capsule Take 1 capsule (300 mg total) by mouth 3 (three) times daily.  . insulin aspart (NOVOLOG) 100 UNIT/ML injection Inject 5 Units into the skin 3 (three) times daily with meals.  . insulin aspart  protamine- aspart (NOVOLOG MIX 70/30) (70-30) 100 UNIT/ML injection Inject 0.28 mLs (28 Units total) into the skin daily with supper.  . pantoprazole (PROTONIX) 40 MG tablet Take 1 tablet (40 mg total) by mouth daily.  . polyethylene glycol (MIRALAX / GLYCOLAX) packet Take 17 g by mouth 2 (two) times daily. (Patient not taking: Reported on 07/14/2017)  . promethazine (PHENERGAN) 25 MG tablet TAKE 1 TABLET (25 MG TOTAL) BY MOUTH EVERY 8 (EIGHT) HOURS AS NEEDED FOR NAUSEA OR VOMITING.  . silver sulfADIAZINE (SILVADENE) 1 % cream Apply 1 application topically daily.  Marland Kitchen thiamine 100 MG tablet Take 1 tablet (100 mg total) by mouth daily. (Patient not taking: Reported on 07/14/2017)  . traMADol (ULTRAM) 50 MG tablet Take 1 tablet (50 mg total) by mouth every 12 (twelve) hours as needed for severe pain.    FAMILY HISTORY:  His indicated that his mother is deceased. He indicated that the status of his father is unknown.   SOCIAL HISTORY: He  reports that he has quit smoking. He has never used smokeless tobacco. He reports that he does not drink alcohol or use drugs.  REVIEW OF SYSTEMS:   Not obtainable  SUBJECTIVE:  Not obtainable  VITAL SIGNS: There were no vitals taken for this visit.  HEMODYNAMICS:    VENTILATOR SETTINGS:    INTAKE / OUTPUT: No intake/output data recorded.  PHYSICAL EXAMINATION: General: This is an extremely disheveled individual, who has maneuvered himself  partially off the gurney. Neuro: He has spontaneous eye opening  but does not respond to voice.  He is spontaneously moving the left side more than the right and does not respond to noxious stimuli.  He appears to have a right facial droop.  Pupils are equal. HEENT: His membranes are extremely dry, teeth are in very poor repair and breath is fetid.  I do not detect the scent of ethanol, or methanol Cardiovascular: S1 and S2 are regular without murmur rub or gallop Lungs: Abrasions are surprisingly unlabored, there  is symmetric air movement, a few scattered rhonchi and no wheezes. Abdomen: Abdomen is soft without any overt organomegaly masses tenderness guarding or rebound.  Bowel sounds are decreased. Musculoskeletal: His numerous scattered abrasions.  LABS:  BMET Recent Labs  Lab 08/30/17 1531 08/30/17 1604  NA 126* 129*  K 6.1* 6.1*  CL 82* 90*  CO2 11*  --   BUN 79* 61*  CREATININE 3.62* 2.50*  GLUCOSE 1,273* >700*    Electrolytes Recent Labs  Lab 08/30/17 1531  CALCIUM 7.6*    CBC Recent Labs  Lab 08/30/17 1531 08/30/17 1604  WBC 25.0*  --   HGB 11.5* 14.3  HCT 38.2* 42.0  PLT 248  --     Coag's No results for input(s): APTT, INR in the last 168 hours.  Sepsis Markers Recent Labs  Lab 08/30/17 1601  LATICACIDVEN 6.07*    ABG No results for input(s): PHART, PCO2ART, PO2ART in the last 168 hours.  Liver Enzymes No results for input(s): AST, ALT, ALKPHOS, BILITOT, ALBUMIN in the last 168 hours.  Cardiac Enzymes No results for input(s): TROPONINI, PROBNP in the last 168 hours.  Glucose Recent Labs  Lab 08/30/17 1529  GLUCAP >600*    Imaging No results found.   ANTIBIOTICS: Zosyn started 3/29  DISCUSSION:     This is a 42 year old known diabetic who prevents DKA with altered mental status.  ASSESSMENT / PLAN:  PULMONARY A: Do not have a primary pulmonary diagnosis however to rule out aspiration with a chest x-ray I have empirically covered the patient with Zosyn pending x-ray and culture results.  CARDIOVASCULAR A: He had what was probably a hyperkalemic arrhythmic you on arrival and an uninterpretable EKG.  I am going to be obtaining serial enzymes to rule out ischemia as the provocation for his DKA and obviously monitoring serial electrolytes.     INFECTIOUS A: I am empirically covering for aspiration as noted  ENDOCRINE A: He clearly has DKA with a very wide gap acidosis.  The significant question is whether his DKA is entirely due to  noncompliance or whether or not he has a significant underlying provocation.  Cardiac ischemia and secondary infection is being ruled out.  In addition I am concerned about the coingestion of a toxin which would produce a wide anion gap therefore I have ordered the simultaneous sampling of a BNP and serum osmolality.  DKA is being corrected with entered volume repletion and insulin infusion.  NEUROLOGIC A: It is a difficult examination as the patient is altered on presentation and is subsequently received ketamine however I am concerned that he is focal and I have ordered a CT scan of the head.  Other than 32 minutes was spent in the care of this acutely ill patient today who has life-threatening arrhythmias    Penny Pia, MD Critical Care Medicine [facility] Pager: [phone]  08/30/2017, 5:14 PM

## 2017-08-30 NOTE — ED Triage Notes (Signed)
Pt arrived via [facility]; per EMS pt from home, found by family sitting on commode without response; per EMS pt tachy and slightly combative, only respond to painful stimuli; Hx of DM, CVA w/ left sided deficits; 20 LAC; 116/54; 116; 24; 98 on RA; CBG high

## 2017-08-30 NOTE — ED Provider Notes (Signed)
[facility] EMERGENCY DEPARTMENT Provider Note   CSN: [id number] Arrival date & time: 08/30/17  1529     History   Chief Complaint Chief Complaint  Patient presents with  . Altered Mental Status  . Hyperglycemia    HPI Matthew Riley is a 42 y.o. male.  The history is provided by the EMS personnel.  Hyperglycemia  Blood sugar level PTA:  >500 Severity:  Severe Timing:  Constant Associated symptoms: altered mental status and confusion   Altered mental status:    Severity:  Severe   Onset quality:  Gradual   Timing:  Constant   Progression:  Worsening Patient reportedly found by family today in the bathroom, extremely altered.  Patient was mildly combative with EMS.  Reported blood sugar was over 500.  EMS EKG noted to have obvious peaked T waves.  Past Medical History:  Diagnosis Date  . Coronary artery disease   . Diabetes mellitus without complication (HCC)   . GERD (gastroesophageal reflux disease)   . Hepatitis B   . Hypertension   . Renal disorder   . Rheumatoid arthritis (HCC)   . Stroke [facility]) 02/2017    Patient Active Problem List   Diagnosis Date Noted  . DKA (diabetic ketoacidoses) (HCC) 08/30/2017  . Hypokalemia   . Bacteremia   . PNA (pneumonia)   . Acute blood loss anemia   . Acute systolic heart failure (HCC)   . Labile blood glucose   . Hypoglycemia   . Debility   . Neuropathic pain   . Aspiration pneumonia of both lungs (HCC)   . Leukocytosis   . Acute systolic CHF (congestive heart failure) (HCC)   . Ulcers of both lower extremities (HCC)   . Rheumatoid arthritis (HCC)   . 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 Transaminitis   . Cough   . Encephalopathy 06/03/2017  . Acute respiratory failure with hypoxia (HCC)   . Septic shock (HCC)   . Abdominal pain 04/16/2017  . Tobacco abuse 04/16/2017  . Leg wound, right 04/16/2017  . Bilateral leg edema 04/16/2017  . History of  stroke 04/16/2017  . CAD (coronary artery disease) 04/16/2017  . Hyperglycemia 04/16/2017  . Hypertension   . Uncontrolled type 1 diabetes mellitus with hyperglycemia (HCC)   . Neuropathy   . Nausea & vomiting 03/08/2017  . GERD (gastroesophageal reflux disease) 03/08/2017  . Hyponatremia 03/08/2017  . AKI (acute kidney injury) (HCC) 03/08/2017  . Hyperkalemia 03/08/2017  . DKA, type 1 [facility])     Past Surgical History:  Procedure Laterality Date  . CORONARY ANGIOPLASTY WITH STENT PLACEMENT          Home Medications    Prior to Admission medications   Medication Sig Start Date End Date Taking? Authorizing Provider  aspirin EC 81 MG tablet Take 1 tablet (81 mg total) by mouth daily. 06/25/17   Lizbeth Bark, FNP  atorvastatin (LIPITOR) 40 MG tablet Take 1 tablet (40 mg total) by mouth daily at 6 PM. 07/22/17   Hoy Register, MD  cyclobenzaprine (FLEXERIL) 5 MG tablet Take 1 tablet (5 mg total) by mouth 3 (three) times daily as needed for muscle spasms. 06/21/17   Love, Evlyn Kanner, PA-C  gabapentin (NEURONTIN) 300 MG capsule Take 1 capsule (300 mg total) by mouth 3 (three) times daily. 07/22/17   Hoy Register, MD  insulin aspart (NOVOLOG) 100 UNIT/ML injection Inject 5 Units into the skin 3 (three) times  daily with meals. 07/22/17   Hoy Register, MD  insulin aspart protamine- aspart (NOVOLOG MIX 70/30) (70-30) 100 UNIT/ML injection Inject 0.28 mLs (28 Units total) into the skin daily with supper. 07/22/17   Hoy Register, MD  pantoprazole (PROTONIX) 40 MG tablet Take 1 tablet (40 mg total) by mouth daily. 07/22/17   Hoy Register, MD  polyethylene glycol (MIRALAX / GLYCOLAX) packet Take 17 g by mouth 2 (two) times daily. Patient not taking: Reported on 07/14/2017 06/21/17   Love, Evlyn Kanner, PA-C  promethazine (PHENERGAN) 25 MG tablet TAKE 1 TABLET (25 MG TOTAL) BY MOUTH EVERY 8 (EIGHT) HOURS AS NEEDED FOR NAUSEA OR VOMITING. 08/16/17   Marcine Matar, MD  silver sulfADIAZINE  (SILVADENE) 1 % cream Apply 1 application topically daily. 07/22/17   Hoy Register, MD  thiamine 100 MG tablet Take 1 tablet (100 mg total) by mouth daily. Patient not taking: Reported on 07/14/2017 06/13/17   Regalado, Jon Billings A, MD  traMADol (ULTRAM) 50 MG tablet Take 1 tablet (50 mg total) by mouth every 12 (twelve) hours as needed for severe pain. 07/18/17   Lizbeth Bark, FNP    Family History Family History  Problem Relation Age of Onset  . Diabetes Mother   . Diabetes Father     Social History Social History   Tobacco Use  . Smoking status: Former Games developer  . Smokeless tobacco: Never Used  . Tobacco comment: reports smoking for many years, quit years ago  Substance Use Topics  . Alcohol use: No    Frequency: Never    Comment: former use, last drink early 2018  . Drug use: No    Comment: patient denies     Allergies   Patient has no known allergies.   Review of Systems Review of Systems  Unable to perform ROS: Mental status change  Psychiatric/Behavioral: Positive for confusion.  Level 5 caveat, altered mental status   Physical Exam Updated Vital Signs There were no vitals taken for this visit.  Physical Exam  Constitutional: He appears well-developed and well-nourished. He appears lethargic. He has a sickly appearance.  Smells of ketones and urine  HENT:  Head: Normocephalic and atraumatic.  Eyes: Pupils are equal, round, and reactive to light. Conjunctivae are normal.  Neck: Neck supple.  Cardiovascular: Regular rhythm. Tachycardia present.  No murmur heard. Pulmonary/Chest: Effort normal and breath sounds normal. Tachypnea noted. No respiratory distress. He has no decreased breath sounds. He has no wheezes. He has no rales.  Abdominal: Soft. He exhibits no distension. There is no tenderness. There is no guarding.  Musculoskeletal: He exhibits no edema.  Neurological: He appears lethargic. He displays no seizure activity. GCS eye subscore is 1. GCS  verbal subscore is 1. GCS motor subscore is 4.  Moving all 4 extremities spontaneously with 5/5 strength  Skin: Skin is warm and dry.  Psychiatric: He has a normal mood and affect. He is agitated.  Nursing note and vitals reviewed.    ED Treatments / Results  Labs (all labs ordered are listed, but only abnormal results are displayed) Labs Reviewed  BASIC METABOLIC PANEL - Abnormal; Notable for the following components:      Result Value   Sodium 126 (*)    Potassium 6.1 (*)    Chloride 82 (*)    CO2 11 (*)    Glucose, Bld 1,273 (*)    BUN 79 (*)    Creatinine, Ser 3.62 (*)    Calcium 7.6 (*)  GFR calc non Af Amer 19 (*)    GFR calc Af Amer 22 (*)    Anion gap 33 (*)    All other components within normal limits  CBC - Abnormal; Notable for the following components:   WBC 25.0 (*)    RBC 3.90 (*)    Hemoglobin 11.5 (*)    HCT 38.2 (*)    RDW 16.5 (*)    All other components within normal limits  BETA-HYDROXYBUTYRIC ACID - Abnormal; Notable for the following components:   Beta-Hydroxybutyric Acid >8.00 (*)    All other components within normal limits  AMMONIA - Abnormal; Notable for the following components:   Ammonia 128 (*)    All other components within normal limits  CBG MONITORING, ED - Abnormal; Notable for the following components:   Glucose-Capillary >600 (*)    All other components within normal limits  I-STAT CG4 LACTIC ACID, ED - Abnormal; Notable for the following components:   Lactic Acid, Venous 6.07 (*)    All other components within normal limits  I-STAT CHEM 8, ED - Abnormal; Notable for the following components:   Sodium 129 (*)    Potassium 6.1 (*)    Chloride 90 (*)    BUN 61 (*)    Creatinine, Ser 2.50 (*)    Glucose, Bld >700 (*)    Calcium, Ion 0.85 (*)    TCO2 12 (*)    All other components within normal limits  I-STAT VENOUS BLOOD GAS, ED - Abnormal; Notable for the following components:   pCO2, Ven 74.7 (*)    pO2, Ven 48.0 (*)     Bicarbonate 45.3 (*)    TCO2 48 (*)    Acid-Base Excess 17.0 (*)    All other components within normal limits  I-STAT ARTERIAL BLOOD GAS, ED - Abnormal; Notable for the following components:   pH, Arterial 7.305 (*)    pCO2 arterial 18.6 (*)    pO2, Arterial 140.0 (*)    Bicarbonate 9.3 (*)    TCO2 10 (*)    Acid-base deficit 15.0 (*)    All other components within normal limits  CULTURE, BLOOD (ROUTINE X 2)  CULTURE, BLOOD (ROUTINE X 2)  ETHANOL  URINALYSIS, ROUTINE W REFLEX MICROSCOPIC  BLOOD GAS, VENOUS  RAPID URINE DRUG SCREEN, HOSP PERFORMED  OSMOLALITY  BASIC METABOLIC PANEL  HEPATIC FUNCTION PANEL  DRUG PROFILE, UR, 9 DRUGS ([facility])  TROPONIN I  TROPONIN I  TROPONIN I  PROCALCITONIN  PROCALCITONIN  CBC  CBC  BASIC METABOLIC PANEL  MAGNESIUM  PHOSPHORUS  I-STAT TROPONIN, ED  I-STAT CHEM 8, ED  I-STAT CHEM 8, ED    EKG EKG Interpretation  Date/Time:  Friday August 30 2017 15:29:09 EDT Ventricular Rate:  111 PR Interval:    QRS Duration: 170 QT Interval:  452 QTC Calculation: 615 R Axis:   -92 Text Interpretation:  Sinus tachycardia RAE, consider biatrial enlargement Nonspecific IVCD with LAD Baseline wander in lead(s) V2 V4 Confirmed by [name] ([id number]) on 08/30/2017 3:37:16 PM   Radiology No results found.  Procedures Procedures (including critical care time)  Medications Ordered in ED Medications  0.9 %  sodium chloride infusion (has no administration in time range)  dextrose 5 %-0.45 % sodium chloride infusion (has no administration in time range)  insulin regular (NOVOLIN R,HUMULIN R) 100 Units in sodium chloride 0.9 % 100 mL (1 Units/mL) infusion (10.8 Units/hr Intravenous Rate/Dose Change 08/30/17 1656)  thiamine (B-1) injection 100 mg (has  no administration in time range)  0.9 %  sodium chloride infusion (has no administration in time range)  heparin injection 5,000 Units (has no administration in time range)  piperacillin-tazobactam  (ZOSYN) IVPB 3.375 g (has no administration in time range)  piperacillin-tazobactam (ZOSYN) IVPB 3.375 g (has no administration in time range)  sodium chloride 0.9 % bolus 1,000 mL (1,000 mLs Intravenous New Bag/Given 08/30/17 1603)  sodium chloride 0.9 % bolus 1,000 mL (0 mLs Intravenous Stopped 08/30/17 1623)  LORazepam (ATIVAN) injection 2 mg (2 mg Intravenous Given 08/30/17 1554)  calcium chloride injection 1 g (1 g Intravenous Given 08/30/17 1556)  sodium bicarbonate injection 50 mEq (50 mEq Intravenous Given 08/30/17 1556)  calcium chloride 10 % injection (1,000 mg  Given 08/30/17 1554)  sodium bicarbonate 1 mEq/mL injection (50 mEq  Given 08/30/17 1554)  LORazepam (ATIVAN) 2 MG/ML injection (2 mg  Given 08/30/17 1610)  ketamine (KETALAR) injection 90 mg (90 mg Intramuscular Given 08/30/17 1613)     Initial Impression / Assessment and Plan / ED Course  I have reviewed the triage vital signs and the nursing notes.  Pertinent labs & imaging results that were available during my care of the patient were reviewed by me and considered in my medical decision making (see chart for details).     Patient is a 42 year old male with history of diabetes on insulin, CAD, stroke, rheumatoid arthritis, hepatitis B, hypertension who presents with altered mental status and severe hyperglycemia.  On presentation, patient has only withdrawing to pain, does not open his eyes and has no verbal response.  He has Kussmaul breathing and smells of ketones.  EKG shows QRS of 268 and high peaked T waves.  Presentation concerning for severe DKA with significant hyperkalemia.  IV access quickly established.  Strain with IV fluid bolus, calcium Gluconate, Insulin Drip.  Sending DKA labs and other metabolic and toxic workup.  Patient moving all 4 extremities to pain and sometimes spontaneously so we will hold off on a head CT at this time unless his mentation worsens or does not improve with resuscitation.  His DKA at this  time adequately explains his altered mental status.  Shortly into his ED stay, patient's QRS widening and he went into a slow ventricular tachycardia rhythm.  Patient maintained strong pulses.  Patient given 2 doses of calcium chloride and 2 A of bicarb with narrowing of his QRS complex.  Patient resuscitated with 4 L of normal saline and started on normal saline infusion.  Insulin drip is started 1 units/KG/hour.  Patient maintained on the cardiac monitor and pads.    Labs as above significant for an ABG with pH of 7.3, PCO2 of 18, bicarb of 9.  He has a potassium of 6.1 on the BMP, blood glucose of 1200, low bicarb, anion gap of 33.  He is also noted to have a ammonia level over 100 likely secondary to his history of hepatitis.  He has a leukocytosis of 25,000, infectious screening with UA and chest x-ray obtained.  Patient did require 2 doses of Ativan 2 mg due to agitation and during his care.  Patient also given 1 mg/KG of ketamine IM for agitation.  This did improve his delirium and agitation.  Patient will be admitted to the ICU for severe DKA.  Final Clinical Impressions(s) / ED Diagnoses   Final diagnoses:  Delirium  Diabetic ketoacidosis without coma associated with other specified diabetes mellitus (HCC)  Lactic acidosis    ED Discharge Orders  None       Dwana Melena, DO 08/30/17 1742    [name], MD 08/31/17 [phone]

## 2017-08-30 NOTE — ED Notes (Signed)
Lactic acid and chem 8 result given to Dr. Fayrene Fearing

## 2017-08-31 ENCOUNTER — Other Ambulatory Visit (HOSPITAL_COMMUNITY): Payer: Medicaid Other

## 2017-08-31 DIAGNOSIS — G9340 Encephalopathy, unspecified: Secondary | ICD-10-CM

## 2017-08-31 LAB — GLUCOSE, CAPILLARY
GLUCOSE-CAPILLARY: 126 mg/dL — AB (ref 65–99)
GLUCOSE-CAPILLARY: 139 mg/dL — AB (ref 65–99)
GLUCOSE-CAPILLARY: 175 mg/dL — AB (ref 65–99)
GLUCOSE-CAPILLARY: 21 mg/dL — AB (ref 65–99)
GLUCOSE-CAPILLARY: 215 mg/dL — AB (ref 65–99)
GLUCOSE-CAPILLARY: 220 mg/dL — AB (ref 65–99)
GLUCOSE-CAPILLARY: 257 mg/dL — AB (ref 65–99)
GLUCOSE-CAPILLARY: 287 mg/dL — AB (ref 65–99)
GLUCOSE-CAPILLARY: 478 mg/dL — AB (ref 65–99)
GLUCOSE-CAPILLARY: 497 mg/dL — AB (ref 65–99)
GLUCOSE-CAPILLARY: 50 mg/dL — AB (ref 65–99)
GLUCOSE-CAPILLARY: 562 mg/dL — AB (ref 65–99)
GLUCOSE-CAPILLARY: 72 mg/dL (ref 65–99)
GLUCOSE-CAPILLARY: 76 mg/dL (ref 65–99)
Glucose-Capillary: 351 mg/dL — ABNORMAL HIGH (ref 65–99)
Glucose-Capillary: 116 mg/dL — ABNORMAL HIGH (ref 65–99)
Glucose-Capillary: 86 mg/dL (ref 65–99)
Glucose-Capillary: 103 mg/dL — ABNORMAL HIGH (ref 65–99)
Glucose-Capillary: 160 mg/dL — ABNORMAL HIGH (ref 65–99)
Glucose-Capillary: 209 mg/dL — ABNORMAL HIGH (ref 65–99)
Glucose-Capillary: 220 mg/dL — ABNORMAL HIGH (ref 65–99)
Glucose-Capillary: 232 mg/dL — ABNORMAL HIGH (ref 65–99)
Glucose-Capillary: 245 mg/dL — ABNORMAL HIGH (ref 65–99)
Glucose-Capillary: 248 mg/dL — ABNORMAL HIGH (ref 65–99)
Glucose-Capillary: 171 mg/dL — ABNORMAL HIGH (ref 65–99)
Glucose-Capillary: 79 mg/dL (ref 65–99)

## 2017-08-31 LAB — CBC
HCT: 29.8 % — ABNORMAL LOW (ref 39.0–52.0)
Hemoglobin: 10.2 g/dL — ABNORMAL LOW (ref 13.0–17.0)
MCH: 28.9 pg (ref 26.0–34.0)
MCHC: 34.2 g/dL (ref 30.0–36.0)
MCV: 84.4 fL (ref 78.0–100.0)
Platelets: 216 10*3/uL (ref 150–400)
RBC: 3.53 MIL/uL — ABNORMAL LOW (ref 4.22–5.81)
RDW: 16.3 % — AB (ref 11.5–15.5)
WBC: 23.8 10*3/uL — ABNORMAL HIGH (ref 4.0–10.5)

## 2017-08-31 LAB — BASIC METABOLIC PANEL
ANION GAP: 13 (ref 5–15)
ANION GAP: 8 (ref 5–15)
ANION GAP: 9 (ref 5–15)
ANION GAP: 6 (ref 5–15)
ANION GAP: 9 (ref 5–15)
BUN: 72 mg/dL — ABNORMAL HIGH (ref 6–20)
BUN: 70 mg/dL — ABNORMAL HIGH (ref 6–20)
BUN: 66 mg/dL — ABNORMAL HIGH (ref 6–20)
BUN: 54 mg/dL — ABNORMAL HIGH (ref 6–20)
BUN: 50 mg/dL — ABNORMAL HIGH (ref 6–20)
CALCIUM: 8.1 mg/dL — AB (ref 8.9–10.3)
CO2: 25 mmol/L (ref 22–32)
CO2: 30 mmol/L (ref 22–32)
CO2: 26 mmol/L (ref 22–32)
CO2: 28 mmol/L (ref 22–32)
CO2: 26 mmol/L (ref 22–32)
Calcium: 8.6 mg/dL — ABNORMAL LOW (ref 8.9–10.3)
Calcium: 8.5 mg/dL — ABNORMAL LOW (ref 8.9–10.3)
Calcium: 8.2 mg/dL — ABNORMAL LOW (ref 8.9–10.3)
Calcium: 8.2 mg/dL — ABNORMAL LOW (ref 8.9–10.3)
Chloride: 108 mmol/L (ref 101–111)
Chloride: 109 mmol/L (ref 101–111)
Chloride: 110 mmol/L (ref 101–111)
Chloride: 109 mmol/L (ref 101–111)
Chloride: 107 mmol/L (ref 101–111)
Creatinine, Ser: 2.71 mg/dL — ABNORMAL HIGH (ref 0.61–1.24)
Creatinine, Ser: 2.41 mg/dL — ABNORMAL HIGH (ref 0.61–1.24)
Creatinine, Ser: 2.32 mg/dL — ABNORMAL HIGH (ref 0.61–1.24)
Creatinine, Ser: 1.93 mg/dL — ABNORMAL HIGH (ref 0.61–1.24)
Creatinine, Ser: 1.8 mg/dL — ABNORMAL HIGH (ref 0.61–1.24)
GFR calc Af Amer: 31 mL/min — ABNORMAL LOW (ref >60)
GFR calc non Af Amer: 31 mL/min — ABNORMAL LOW (ref >60)
GFR calc non Af Amer: 33 mL/min — ABNORMAL LOW (ref >60)
GFR, EST AFRICAN AMERICAN: 36 mL/min — AB (ref >60)
GFR, EST AFRICAN AMERICAN: 38 mL/min — AB (ref >60)
GFR, EST AFRICAN AMERICAN: 47 mL/min — AB (ref >60)
GFR, EST AFRICAN AMERICAN: 52 mL/min — AB (ref >60)
GFR, EST NON AFRICAN AMERICAN: 27 mL/min — AB (ref >60)
GFR, EST NON AFRICAN AMERICAN: 41 mL/min — AB (ref >60)
GFR, EST NON AFRICAN AMERICAN: 44 mL/min — AB (ref >60)
GLUCOSE: 215 mg/dL — AB (ref 65–99)
GLUCOSE: 95 mg/dL (ref 65–99)
GLUCOSE: 222 mg/dL — AB (ref 65–99)
GLUCOSE: 276 mg/dL — AB (ref 65–99)
Glucose, Bld: 162 mg/dL — ABNORMAL HIGH (ref 65–99)
POTASSIUM: 3 mmol/L — AB (ref 3.5–5.1)
POTASSIUM: 3.7 mmol/L (ref 3.5–5.1)
POTASSIUM: 4.5 mmol/L (ref 3.5–5.1)
POTASSIUM: 3.8 mmol/L (ref 3.5–5.1)
POTASSIUM: 4.8 mmol/L (ref 3.5–5.1)
SODIUM: 143 mmol/L (ref 135–145)
SODIUM: 142 mmol/L (ref 135–145)
Sodium: 146 mmol/L — ABNORMAL HIGH (ref 135–145)
Sodium: 147 mmol/L — ABNORMAL HIGH (ref 135–145)
Sodium: 145 mmol/L (ref 135–145)

## 2017-08-31 LAB — POCT I-STAT 3, VENOUS BLOOD GAS (G3P V)
Acid-Base Excess: 5 mmol/L — ABNORMAL HIGH (ref 0.0–2.0)
BICARBONATE: 30.3 mmol/L — AB (ref 20.0–28.0)
O2 Saturation: 95 %
TCO2: 32 mmol/L (ref 22–32)
pCO2, Ven: 44.4 mmHg (ref 44.0–60.0)
pH, Ven: 7.442 — ABNORMAL HIGH (ref 7.250–7.430)
pO2, Ven: 76 mmHg — ABNORMAL HIGH (ref 32.0–45.0)

## 2017-08-31 LAB — MAGNESIUM: Magnesium: 2.8 mg/dL — ABNORMAL HIGH (ref 1.7–2.4)

## 2017-08-31 LAB — MRSA PCR SCREENING: MRSA by PCR: NEGATIVE

## 2017-08-31 LAB — PROCALCITONIN: Procalcitonin: 1.36 ng/mL

## 2017-08-31 LAB — PHOSPHORUS: Phosphorus: 2.7 mg/dL (ref 2.5–4.6)

## 2017-08-31 LAB — OSMOLALITY: Osmolality: 377 mOsm/kg (ref 275–295)

## 2017-08-31 LAB — TROPONIN I: Troponin I: 0.11 ng/mL (ref <0.03)

## 2017-08-31 MED ORDER — POTASSIUM CHLORIDE 10 MEQ/100ML IV SOLN
10.0000 meq | INTRAVENOUS | Status: AC
  Administered 2017-08-31 (×4): 10 meq via INTRAVENOUS
  Filled 2017-08-31 (×4): qty 100

## 2017-08-31 MED ORDER — DEXTROSE 50 % IV SOLN
INTRAVENOUS | Status: AC
  Administered 2017-09-01: 32 mL
  Filled 2017-08-31: qty 50

## 2017-08-31 MED ORDER — POTASSIUM CHLORIDE IN NACL 40-0.9 MEQ/L-% IV SOLN
INTRAVENOUS | Status: DC
  Administered 2017-08-31: 125 mL/h via INTRAVENOUS
  Administered 2017-08-31 – 2017-09-01 (×2): 75 mL/h via INTRAVENOUS
  Filled 2017-08-31 (×4): qty 1000

## 2017-08-31 MED ORDER — ORAL CARE MOUTH RINSE
15.0000 mL | Freq: Two times a day (BID) | OROMUCOSAL | Status: DC
  Administered 2017-08-31 – 2017-09-04 (×7): 15 mL via OROMUCOSAL

## 2017-08-31 MED ORDER — POTASSIUM CHLORIDE 10 MEQ/100ML IV SOLN: 10.0000 meq | INTRAVENOUS | Status: DC

## 2017-08-31 MED ORDER — LACTATED RINGERS IV BOLUS
2000.0000 mL | Freq: Once | INTRAVENOUS | Status: AC
  Administered 2017-08-31: 2000 mL via INTRAVENOUS

## 2017-08-31 MED ORDER — DEXTROSE 50 % IV SOLN
INTRAVENOUS | Status: AC
  Administered 2017-08-31: 20 mL
  Filled 2017-08-31: qty 50

## 2017-08-31 MED ORDER — LACTATED RINGERS IV BOLUS: 2000.0000 mL | Freq: Once | INTRAVENOUS | Status: DC

## 2017-08-31 NOTE — Progress Notes (Signed)
Pt's 2223 BMP/Lactic Acid results called to Dr. Marylouise Stacks.

## 2017-08-31 NOTE — H&P (Signed)
PULMONARY / CRITICAL CARE MEDICINE   Name: Matthew Riley MRN: [id number] DOB: 08-16-1975    ADMISSION DATE:  08/30/2017  LOS 1 days  CHIEF COMPLAINT: Altered mental status  BRIEF      I have essentially no history on this patient.  EMS was called and family found him in the bathroom unresponsive.  Apparently he was combative on arrival in the emergency room and given an IM dose of ketamine.  He had a wide-complex EKG and was given calcium and sodium bicarbonate.  Electrolytes subsequently came back with a blood sugar greater than 700 and an anion gap of 27 with a bicarbonate of 12. Reviewing the old chart it appears that he was admitted to this hospital in early February for DKA with discharge diagnoses of coronary artery disease and diabetes.  His discharge medicines at that time were aspirin, Flexeril, thiamine, and MiraLAX.  EVENTS 08/30/2017 - admit. UDS - MJ posiotive.     SUBJECTIVE/OVERNIGHT/INTERVAL HX 08/31/2017 - > gap 8, bic 30 , creat down to 2.47. Not on vent. Not on pressors. However, still encephalopathic - lethargic mostly but portecting airway wel Per RN only receieved 3-4L fluid so far (baseline ef 45% in Jan 2019). Also, hypgolycemic and D5 half rate increased. Meets criteria to transition off insulin gtt but not awake enough to eat   VITAL SIGNS: BP (!) 124/95   Pulse 90   Temp 97.9 F (36.6 C) (Oral)   Resp 15   Ht 6' 0.99" (1.854 m)   Wt 84.9 kg (187 lb 2.7 oz)   SpO2 100%   BMI 24.70 kg/m   HEMODYNAMICS:    VENTILATOR SETTINGS:    INTAKE / OUTPUT: I/O last 3 completed shifts: In: 3150.6 [I.V.:2150.6; IV Piggyback:1000] Out: 1900 [Urine:1900]  PHYSICAL EXAMINATION:   General Appearance:    Looks ill, chronic,   Head:    Normocephalic, without obvious abnormality, atraumatic  Eyes:    PERRL - yes, conjunctiva/corneas - clear      Ears:    Normal external ear canals, both ears  Nose:   NG tube - no  Throat:  ETT TUBE - no , OG tube - no  Neck:    Supple,  No enlargement/tenderness/nodules     Lungs:     Clear to auscultation bilaterally,   Chest wall:    No deformity  Heart:    S1 and S2 normal, no murmur, CVP - no.  Pressors - no  Abdomen:     Soft, no masses, no organomegaly  Genitalia:    Not done  Rectal:   not done  Extremities:   Extremities- intact     Skin:   Intact in exposed areas .     Neurologic:   Sedation - none -> RASS - -2 equiavlent . Moves all 4s - intermittently yes. CAM-ICU - neg . Orientation - not oriented. Just moans      PULMONARY Recent Labs  Lab 08/30/17 1604 08/30/17 1616 08/30/17 1724 08/30/17 1908  PHART  --   --  7.305*  --   PCO2ART  --   --  18.6*  --   PO2ART  --   --  140.0*  --   HCO3  --  45.3* 9.3*  --   TCO2 12* 48* 10* 12*  O2SAT  --  81.0 99.0  --     CBC Recent Labs  Lab 08/30/17 1531 08/30/17 1604 08/30/17 1908 08/31/17 0704  HGB 11.5* 14.3 13.6 10.2*  HCT 38.2* 42.0 40.0 29.8*  WBC 25.0*  --   --  23.8*  PLT 248  --   --  216    COAGULATION No results for input(s): INR in the last 168 hours.  CARDIAC   Recent Labs  Lab 08/30/17 1705 08/30/17 2223 08/31/17 0704  TROPONINI 0.05* 0.05* 0.11*   No results for input(s): PROBNP in the last 168 hours.   CHEMISTRY Recent Labs  Lab 08/30/17 1531 08/30/17 1604 08/30/17 1908 08/30/17 2223 08/31/17 0325 08/31/17 0704  NA 126* 129* 139 138 146* 147*  K 6.1* 6.1* 4.3 3.1* 3.0* 3.7  CL 82* 90* 100* 98* 108 109  CO2 11*  --   --  18* 25 30  GLUCOSE 1,273* >700* >700* 629* 215* 95  BUN 79* 61* 59* 73* 72* 70*  CREATININE 3.62* 2.50* 2.30* 3.08* 2.71* 2.41*  CALCIUM 7.6*  --   --  8.5* 8.6* 8.5*  MG  --   --   --   --   --  2.8*  PHOS  --   --   --   --   --  2.7   Estimated Creatinine Clearance: 44.7 mL/min (A) (by C-G formula based on SCr of 2.41 mg/dL (H)).   LIVER Recent Labs  Lab 08/30/17 1705  AST 201*  ALT 261*  ALKPHOS 157*  BILITOT 2.5*  PROT 8.5*  ALBUMIN 2.4*      INFECTIOUS Recent Labs  Lab 08/30/17 1601 08/30/17 1705 08/30/17 2223 08/31/17 0704  LATICACIDVEN 6.07*  --  3.0*  --   PROCALCITON  --  1.15  --  1.36     ENDOCRINE CBG (last 3)  Recent Labs    08/31/17 0653 08/31/17 0758 08/31/17 0902  GLUCAP 76 103* 126*         IMAGING x48h  - image(s) personally visualized  -   highlighted in bold Ct Head Wo Contrast  Result Date: 08/30/2017 CLINICAL DATA:  Altered mental status. Found unresponsive. Probable DKA. EXAM: CT HEAD WITHOUT CONTRAST TECHNIQUE: Contiguous axial images were obtained from the base of the skull through the vertex without intravenous contrast. COMPARISON:  Head CT 06/06/2017 FINDINGS: Brain: No mass lesion, intraparenchymal hemorrhage or extra-axial collection. No evidence of acute cortical infarct. Normal appearance of the brain parenchyma and extra axial spaces for age. Vascular: No hyperdense vessel or unexpected vascular calcification. Skull: Normal visualized skull base, calvarium and extracranial soft tissues. Sinuses/Orbits: No sinus fluid levels or advanced mucosal thickening. No mastoid effusion. Normal orbits. IMPRESSION: Normal head CT. Electronically Signed   By: Deatra Robinson M.D.   On: 08/30/2017 21:50   Dg Chest Portable 1 View  Result Date: 08/30/2017 CLINICAL DATA:  DKA, leukocytosis EXAM: PORTABLE CHEST 1 VIEW COMPARISON:  None. FINDINGS: The heart size and mediastinal contours are within normal limits. Both lungs are clear. The visualized skeletal structures are unremarkable. IMPRESSION: No active disease. Electronically Signed   By: Elige Ko   On: 08/30/2017 18:13       DISCUSSION:     This is a 42 year old known diabetic who prevents DKA with altered mental status.  ASSESSMENT / PLAN:  PULMONARY A: Protecting airway. Not at intubation risk imminent despite encephalopathy  PLAN  - monitor closely  CARDIOVASCULAR A:baseline chronic systolic chf with ef 35-45%  08/31/2017 -  Trop leak +. Admit EKG 3/29 wuith T wave changes of high K  PLAN - check echo - repeat ekg - if acute ioschemic changes then call  cards and start IV heparin    INFECTIOUS A: On empiric Zosyn for possible aspiration  08/31/2017 - CXR clear. PCT elevation is marginal though can be localized bacterial source  Plan Dc zosyn and monitor  ENDOCRINE A: DKA with bic 11 at admit - likely non compliance and Marijuaman provoked   - 08/31/2017 - gaap closed, bic normal, creat better but still obtunded and unable to transition off insulin gtt  Plan  - transition off insuling gtt when more awake  - continue d5 Half normal with glucose monitoring + insulin gtt - LR 2L bolus for dehydration   RENAL A: AKI  At admit   - 08/31/2017 - improved AKI but still looks dehdyrated  PLAN 2L LR bolus Check VBP Monitor k, phos, mag  NEUROLOGIC A: Acute encephalopathy at admit and s/p ketamine in ER. CT head negative  08/31/2017 - less agitated but stil lethargic  PLAN NPO Monitor Fluid bolus   HEME  A: at risk anemia  P - PRBC for hgb </= 6.9gm%    - exceptions are   -  if ACS susepcted/confirmed then transfuse for hgb </= 8.0gm%,  or    -  active bleeding with hemodynamic instability, then transfuse regardless of hemoglobin value   At at all times try to transfuse 1 unit prbc as possible with exception of active hemorrhage   DVT prph with heparin SQ    DISPO: move to sdu. Teaching service primary from 09/01/17 and ccm off. . Though moving to SDU patient still critically ill and at risk for decomp in next 24h    The patient is critically ill with multiple organ systems failure and requires high complexity decision making for assessment and support, frequent evaluation and titration of therapies, application of advanced monitoring technologies and extensive interpretation of multiple databases.   Critical Care Time devoted to patient care services described in this note is  30   Minutes. This time reflects time of care of this signee Dr Kalman Shan. This critical care time does not reflect procedure time, or teaching time or supervisory time of PA/NP/Med student/Med Resident etc but could involve care discussion time    Dr. Kalman Shan, M.D., [facility].C.P Pulmonary and Critical Care Medicine Staff Physician [facility] Pager: [phone], If no answer or between  15:00h - 7:00h: call [phone]  08/31/2017 10:17 AM

## 2017-08-31 NOTE — Progress Notes (Signed)
eLink Physician-Brief Progress Note Patient Name: Sael Furches DOB: 05-18-1976 MRN: [id number]   Date of Service  08/31/2017  HPI/Events of Note  Hypokalemia in spite being on NSS with 40 meq KCL overnight  eICU Interventions  Additional 40 meq kcl iv separately ordered     Intervention Category Minor Interventions: Electrolytes abnormality - evaluation and management  Wilber Oliphant 08/31/2017, 5:41 AM

## 2017-09-01 ENCOUNTER — Inpatient Hospital Stay (HOSPITAL_COMMUNITY): Payer: Medicaid Other

## 2017-09-01 DIAGNOSIS — B1910 Unspecified viral hepatitis B without hepatic coma: Secondary | ICD-10-CM | POA: Diagnosis present

## 2017-09-01 DIAGNOSIS — R740 Nonspecific elevation of levels of transaminase and lactic acid dehydrogenase [LDH]: Secondary | ICD-10-CM

## 2017-09-01 DIAGNOSIS — E876 Hypokalemia: Secondary | ICD-10-CM

## 2017-09-01 DIAGNOSIS — I10 Essential (primary) hypertension: Secondary | ICD-10-CM

## 2017-09-01 DIAGNOSIS — Z8619 Personal history of other infectious and parasitic diseases: Secondary | ICD-10-CM

## 2017-09-01 DIAGNOSIS — R0603 Acute respiratory distress: Secondary | ICD-10-CM

## 2017-09-01 DIAGNOSIS — E1010 Type 1 diabetes mellitus with ketoacidosis without coma: Principal | ICD-10-CM

## 2017-09-01 DIAGNOSIS — Z9114 Patient's other noncompliance with medication regimen: Secondary | ICD-10-CM

## 2017-09-01 DIAGNOSIS — I110 Hypertensive heart disease with heart failure: Secondary | ICD-10-CM

## 2017-09-01 DIAGNOSIS — I5022 Chronic systolic (congestive) heart failure: Secondary | ICD-10-CM

## 2017-09-01 DIAGNOSIS — I2510 Atherosclerotic heart disease of native coronary artery without angina pectoris: Secondary | ICD-10-CM

## 2017-09-01 DIAGNOSIS — I340 Nonrheumatic mitral (valve) insufficiency: Secondary | ICD-10-CM

## 2017-09-01 LAB — BASIC METABOLIC PANEL
Anion gap: 5 (ref 5–15)
Anion gap: 6 (ref 5–15)
Anion gap: 7 (ref 5–15)
Anion gap: 7 (ref 5–15)
BUN: 23 mg/dL — AB (ref 6–20)
BUN: 27 mg/dL — AB (ref 6–20)
BUN: 31 mg/dL — AB (ref 6–20)
BUN: 41 mg/dL — AB (ref 6–20)
CALCIUM: 7.9 mg/dL — AB (ref 8.9–10.3)
CALCIUM: 7.9 mg/dL — AB (ref 8.9–10.3)
CALCIUM: 8.6 mg/dL — AB (ref 8.9–10.3)
CO2: 25 mmol/L (ref 22–32)
CO2: 26 mmol/L (ref 22–32)
CO2: 29 mmol/L (ref 22–32)
CO2: 29 mmol/L (ref 22–32)
CREATININE: 1.12 mg/dL (ref 0.61–1.24)
CREATININE: 1.22 mg/dL (ref 0.61–1.24)
CREATININE: 1.55 mg/dL — AB (ref 0.61–1.24)
Calcium: 8.3 mg/dL — ABNORMAL LOW (ref 8.9–10.3)
Chloride: 103 mmol/L (ref 101–111)
Chloride: 104 mmol/L (ref 101–111)
Chloride: 108 mmol/L (ref 101–111)
Chloride: 99 mmol/L — ABNORMAL LOW (ref 101–111)
Creatinine, Ser: 0.97 mg/dL (ref 0.61–1.24)
GFR calc Af Amer: >60 mL/min (ref >60)
GFR calc Af Amer: >60 mL/min (ref >60)
GFR calc Af Amer: >60 mL/min (ref >60)
GFR calc non Af Amer: >60 mL/min (ref >60)
GFR calc non Af Amer: >60 mL/min (ref >60)
GFR, EST NON AFRICAN AMERICAN: 53 mL/min — AB (ref >60)
GLUCOSE: 212 mg/dL — AB (ref 65–99)
GLUCOSE: 48 mg/dL — AB (ref 65–99)
GLUCOSE: 96 mg/dL (ref 65–99)
Glucose, Bld: 129 mg/dL — ABNORMAL HIGH (ref 65–99)
POTASSIUM: 3.3 mmol/L — AB (ref 3.5–5.1)
POTASSIUM: 3.6 mmol/L (ref 3.5–5.1)
POTASSIUM: 3.7 mmol/L (ref 3.5–5.1)
Potassium: 3.4 mmol/L — ABNORMAL LOW (ref 3.5–5.1)
SODIUM: 138 mmol/L (ref 135–145)
SODIUM: 144 mmol/L (ref 135–145)
Sodium: 135 mmol/L (ref 135–145)
Sodium: 131 mmol/L — ABNORMAL LOW (ref 135–145)

## 2017-09-01 LAB — HEPATIC FUNCTION PANEL
ALK PHOS: 142 U/L — AB (ref 38–126)
ALT: 235 U/L — ABNORMAL HIGH (ref 17–63)
AST: 256 U/L — ABNORMAL HIGH (ref 15–41)
Albumin: 2.3 g/dL — ABNORMAL LOW (ref 3.5–5.0)
BILIRUBIN DIRECT: 0.4 mg/dL (ref 0.1–0.5)
BILIRUBIN INDIRECT: 0.8 mg/dL (ref 0.3–0.9)
BILIRUBIN TOTAL: 1.2 mg/dL (ref 0.3–1.2)
Total Protein: 8.7 g/dL — ABNORMAL HIGH (ref 6.5–8.1)

## 2017-09-01 LAB — GLUCOSE, CAPILLARY
GLUCOSE-CAPILLARY: 129 mg/dL — AB (ref 65–99)
GLUCOSE-CAPILLARY: 257 mg/dL — AB (ref 65–99)
GLUCOSE-CAPILLARY: 234 mg/dL — AB (ref 65–99)
Glucose-Capillary: 101 mg/dL — ABNORMAL HIGH (ref 65–99)
Glucose-Capillary: 112 mg/dL — ABNORMAL HIGH (ref 65–99)
Glucose-Capillary: 262 mg/dL — ABNORMAL HIGH (ref 65–99)
Glucose-Capillary: 38 mg/dL — CL (ref 65–99)
Glucose-Capillary: 61 mg/dL — ABNORMAL LOW (ref 65–99)
Glucose-Capillary: 63 mg/dL — ABNORMAL LOW (ref 65–99)
Glucose-Capillary: 82 mg/dL (ref 65–99)
Glucose-Capillary: 89 mg/dL (ref 65–99)
Glucose-Capillary: 95 mg/dL (ref 65–99)

## 2017-09-01 LAB — HEMOGLOBIN A1C
HEMOGLOBIN A1C: 10.3 % — AB (ref 4.8–5.6)
Mean Plasma Glucose: 248.91 mg/dL

## 2017-09-01 LAB — RAPID HIV SCREEN (HIV 1/2 AB+AG)
HIV 1/2 ANTIBODIES: NONREACTIVE
HIV-1 P24 ANTIGEN - HIV24: NONREACTIVE

## 2017-09-01 LAB — ECHOCARDIOGRAM COMPLETE
HEIGHTINCHES: 72.9921 in
WEIGHTICAEL: 3146.41 [oz_av]

## 2017-09-01 LAB — PHOSPHORUS: Phosphorus: 1.6 mg/dL — ABNORMAL LOW (ref 2.5–4.6)

## 2017-09-01 LAB — PROCALCITONIN: PROCALCITONIN: 0.59 ng/mL

## 2017-09-01 LAB — LACTIC ACID, PLASMA: LACTIC ACID, VENOUS: 1.8 mmol/L (ref 0.5–1.9)

## 2017-09-01 LAB — MAGNESIUM: Magnesium: 2 mg/dL (ref 1.7–2.4)

## 2017-09-01 LAB — HIV ANTIBODY (ROUTINE TESTING W REFLEX): HIV Screen 4th Generation wRfx: NONREACTIVE

## 2017-09-01 MED ORDER — POTASSIUM CHLORIDE 10 MEQ/100ML IV SOLN
10.0000 meq | INTRAVENOUS | Status: AC
  Administered 2017-09-01 (×3): 10 meq via INTRAVENOUS
  Filled 2017-09-01 (×3): qty 100

## 2017-09-01 MED ORDER — ACETAMINOPHEN 325 MG PO TABS
650.0000 mg | ORAL_TABLET | Freq: Four times a day (QID) | ORAL | Status: DC | PRN
  Administered 2017-09-01 – 2017-09-02 (×2): 650 mg via ORAL
  Filled 2017-09-01 (×2): qty 2

## 2017-09-01 MED ORDER — DEXTROSE 50 % IV SOLN
INTRAVENOUS | Status: AC
  Administered 2017-09-01: 50 mL
  Filled 2017-09-01: qty 50

## 2017-09-01 MED ORDER — INSULIN GLARGINE 100 UNIT/ML ~~LOC~~ SOLN
18.0000 [IU] | Freq: Every day | SUBCUTANEOUS | Status: DC
  Administered 2017-09-01: 18 [IU] via SUBCUTANEOUS
  Filled 2017-09-01 (×2): qty 0.18

## 2017-09-01 MED ORDER — INSULIN ASPART 100 UNIT/ML ~~LOC~~ SOLN
0.0000 [IU] | Freq: Three times a day (TID) | SUBCUTANEOUS | Status: DC
Start: 2017-09-01 — End: 2017-09-01

## 2017-09-01 MED ORDER — INSULIN ASPART 100 UNIT/ML ~~LOC~~ SOLN
0.0000 [IU] | Freq: Three times a day (TID) | SUBCUTANEOUS | Status: DC
  Administered 2017-09-01: 8 [IU] via SUBCUTANEOUS
  Administered 2017-09-02: 2 [IU] via SUBCUTANEOUS
  Administered 2017-09-02 (×2): 11 [IU] via SUBCUTANEOUS
  Administered 2017-09-03: 8 [IU] via SUBCUTANEOUS

## 2017-09-01 MED ORDER — K PHOS MONO-SOD PHOS DI & MONO 155-852-130 MG PO TABS
500.0000 mg | ORAL_TABLET | Freq: Three times a day (TID) | ORAL | Status: AC
  Administered 2017-09-01 – 2017-09-02 (×2): 500 mg via ORAL
  Filled 2017-09-01 (×3): qty 2

## 2017-09-01 MED ORDER — INSULIN ASPART 100 UNIT/ML ~~LOC~~ SOLN: 4.0000 [IU] | Freq: Three times a day (TID) | SUBCUTANEOUS | Status: DC

## 2017-09-01 MED ORDER — ONDANSETRON HCL 4 MG PO TABS: 4.0000 mg | ORAL_TABLET | Freq: Three times a day (TID) | ORAL | Status: DC | PRN

## 2017-09-01 MED ORDER — POTASSIUM PHOSPHATE MONOBASIC 500 MG PO TABS
500.0000 mg | ORAL_TABLET | Freq: Three times a day (TID) | ORAL | Status: DC
  Administered 2017-09-01: 500 mg via ORAL
  Filled 2017-09-01 (×4): qty 1

## 2017-09-01 MED ORDER — SODIUM CHLORIDE 0.9 % IV SOLN
INTRAVENOUS | Status: DC
  Filled 2017-09-01: qty 1

## 2017-09-01 MED ORDER — METOCLOPRAMIDE HCL 10 MG PO TABS
5.0000 mg | ORAL_TABLET | Freq: Three times a day (TID) | ORAL | Status: DC | PRN
  Administered 2017-09-01 – 2017-09-03 (×3): 5 mg via ORAL
  Filled 2017-09-01 (×3): qty 1

## 2017-09-01 NOTE — Progress Notes (Signed)
Transfer summary: Matthew Riley is a 42 year old man DKA prone type 1 diabetic, hypertension, HFrEF (40-45%, mild concentric hypertrophy, g1dd, no regional wall motion abnormalities, mild mitral regurg), CAD, and history of stroke who was admitted on 08/30/17 [facility] after EMS found patient in bathroom unresponsive. He presented in DKA (Glu=1273, bicarb=11,AG=33) and acute encephalopathy secondary to mediation noncompliance and marijuana provocation. He was given ketamine in ER. CXR on admission was clear and pct was mildly elevated and therefore patient was placed on empiric zosyn for possible aspiration.  CT head was without any acute intracranial abnormalities. The patient has been placed on D5 1/2 NS with insulin ggt and LR boluses. The patient is protecting airway.   Subjective:   The patient was seen laying in his bed with sheet over his face. He denied any nausea, sob, or chest pain. He mentioned that he was just very lethargic and weak.  Overnight the patient had 4 episodes of hypoglycemia.  Objective:  Vital signs in last 24 hours: Vitals:   09/01/17 0500 09/01/17 0600 09/01/17 0700 09/01/17 0810  BP: (!) 157/116 (!) 119/96 (!) 123/107   Pulse: 87  (!) 46   Resp: 16 (!) 29 17   Temp:    97.7 F (36.5 C)  TempSrc:    Oral  SpO2: 98%  90%   Weight:      Height:       Physical Exam  Constitutional: He appears well-developed and well-nourished. He appears lethargic. He has a sickly appearance.  HENT:  Head: Normocephalic and atraumatic.  Eyes: Conjunctivae are normal.  Cardiovascular: Normal rate, regular rhythm, normal heart sounds and intact distal pulses.  No murmur heard. Respiratory: Breath sounds normal. He is in respiratory distress. He has no wheezes.  GI: Soft. Bowel sounds are normal. He exhibits no distension. There is no tenderness.  Musculoskeletal: He exhibits no edema.  Neurological: He appears lethargic. No cranial nerve deficit.  Skin: No erythema.    Psychiatric: He has a normal mood and affect. His behavior is normal. Judgment and thought content normal.    Assessment/Plan:  Matthew Riley is a 42 y/o DKA prone type 1 diabetic with hypertension, HFrEF (ef 40-45%), CAD, and history of stroke who was admitted on 08/30/17 with acute encephalopathy. Labs significant for Glu=1273, bicarb=11, AG=33 consistent with DKA secondary to mediation noncompliance and marijuana provocation. Patient placed in ICU and being transitioned to floor today (3/31). Currently on insulin drip an getting fluids.   Diabetic Ketoacidosis The patient's morning blood glucose has been ranging 80-110s. His anion gap has closed x2. He was continued on IV insulin for another 2 hrs and transitioned onto subq insulin.   The patient has had several repeated admission's for dka likely due to poor medication compliance and marijuana use.   -continue ssi-moderate -NS with KCL at 8ml/hr -D5-1/2 NS at 131ml/hr -Lantus 18u qd based on weight based dosing 0.2u/kg -Novolog 4u tid  Transaminitis The patient's liver function enzymes continue to be elevated today with ast=256, alt=235, alp=142, tbili=1.2, dbili=0.4, tprot=8.7. CT abdomen done in Jan 2019 showed anasarca, mild fluid in the paracolic gutters and lower pelvis, persistent perihilar and bibasilar consolidations. RUQ ultrasound in [facility] 2018 showed mild gallbladder wall thickening with pericholecystic fluid, no stones or sludge noted.  The patient had hepatitis serologies done back in December 2018 that showed hep bsurf ag positive along with hep b c igm positive which is indicative of a positive infection. Will repeat hepatitis testing.   -  Pending hep b surface ag, e antigen, e antibody, and dna -HIV antibody also ordered  HFrEF The patient's last echo was done in Jan 2019 showed lv ef 40-45%,g1dd, mild mitral regurgitation, mild tricuspid regurgitation, no vegetations visualized. On exam the patient appeared  euvolemic.  -Repeat echo done today showed lvef=40-45%, mild lvh, and normal cvp.   Hypokalemia and Hypophosphatemia Patient's k=3.4 today (09/01/17) and phos=1.6.   Dispo: Anticipated discharge in approximately 1-2 day(s).   Lorenso Courier, MD 09/01/2017, 8:36 AM Pager: [phone]

## 2017-09-01 NOTE — Plan of Care (Signed)
Progressing

## 2017-09-01 NOTE — Progress Notes (Signed)
  Echocardiogram 2D Echocardiogram has been performed.  Roosvelt Maser F 09/01/2017, 2:00 PM

## 2017-09-01 NOTE — Progress Notes (Signed)
Pt cbg 38, 1 amp d50 ivp given.  pt fully alert follows commands, pt asking for water. Pt tolerated sips of water, no cough noted. 1/2 of graham cracker with peanut butter and 8 oz diet ginger ale given

## 2017-09-01 NOTE — Progress Notes (Addendum)
Pt cbg 63, 1 amp d50 ivp given, pt remains alert and follows commands, 1/2 graham cracker with peanut butter and 8 oz orange juice given, pt tolerated without difficulty swallowing. Insulin drip remains off, peripheral IVs x 3 infusing without difficulty.

## 2017-09-01 NOTE — Progress Notes (Signed)
eLink Physician-Brief Progress Note Patient Name: Matthew Riley DOB: 02/15/1976 MRN: [id number]   Date of Service  09/01/2017  HPI/Events of Note  K+ = 3.3 and Creatinine = 1.55.  eICU Interventions  Will replace K+.      Intervention Category Major Interventions: Electrolyte abnormality - evaluation and management  Sommer,Steven Eugene 09/01/2017, 12:53 AM

## 2017-09-01 NOTE — Progress Notes (Addendum)
Patient with multiple hypoglycemic CBG's while on insulin gtt. 50, 72, 21. Patient was not transitioned off stabilizer because he was "obtunded." Call placed to CCM to see if the patient can be taken off stabilizer. Awaiting orders.  Paytan Recine A Duanna Runk, RN       Spoke with Dr. Arsenio Loader at [facility] - unable to stop gtt and transition patient right now because his CBG's are not high enough to give long acting coverage as this will stay in his system. Recommending that we hold the insulin gtt for now until we have 2 consecutive CBG's above 100. Will monitor closely and conservatively keep "on insulin gtt" until safely able to transition.  Noe Gens, RN

## 2017-09-01 NOTE — Discharge Summary (Addendum)
Name: Matthew Riley MRN: [id number] DOB: Jul 01, 1975 42 y.o. PCP: Charlott Rakes, MD  Date of Admission: 08/30/2017  3:29 PM Date of Discharge: 09/04/2017 Attending Physician: Lalla Brothers MD  Discharge Diagnosis: Active Problems:   Encephalopathy acute   DKA (diabetic ketoacidoses) ([place])   Hepatitis B infection   Discharge Medications: Allergies as of 09/04/2017   No Known Allergies     Medication List    TAKE these medications   aspirin EC 81 MG tablet Take 1 tablet (81 mg total) by mouth daily.   atorvastatin 40 MG tablet Commonly known as:  LIPITOR Take 1 tablet (40 mg total) by mouth daily at 6 PM.   cyclobenzaprine 5 MG tablet Commonly known as:  FLEXERIL Take 1 tablet (5 mg total) by mouth 3 (three) times daily as needed for muscle spasms.   gabapentin 300 MG capsule Commonly known as:  NEURONTIN Take 1 capsule (300 mg total) by mouth 3 (three) times daily.   insulin aspart 100 UNIT/ML injection Commonly known as:  novoLOG Inject 5 Units into the skin 3 (three) times daily with meals. What changed:  how much to take   insulin aspart protamine- aspart (70-30) 100 UNIT/ML injection Commonly known as:  NOVOLOG MIX 70/30 Inject 0.18 mLs (18 Units total) into the skin daily with supper. What changed:  how much to take   metoCLOPramide 5 MG tablet Commonly known as:  REGLAN Take 1 tablet (5 mg total) by mouth every 8 (eight) hours as needed for nausea.   pantoprazole 40 MG tablet Commonly known as:  PROTONIX Take 1 tablet (40 mg total) by mouth daily.   polyethylene glycol packet Commonly known as:  MIRALAX / GLYCOLAX Take 17 g by mouth 2 (two) times daily.   promethazine 25 MG tablet Commonly known as:  PHENERGAN TAKE 1 TABLET (25 MG TOTAL) BY MOUTH EVERY 8 (EIGHT) HOURS AS NEEDED FOR NAUSEA OR VOMITING.   silver sulfADIAZINE 1 % cream Commonly known as:  SILVADENE Apply 1 application topically daily.   thiamine 100 MG tablet Take 1 tablet (100  mg total) by mouth daily.   traMADol 50 MG tablet Commonly known as:  ULTRAM Take 1 tablet (50 mg total) by mouth every 12 (twelve) hours as needed for severe pain.     ASK your doctor about these medications   insulin detemir 100 UNIT/ML injection Commonly known as:  LEVEMIR Inject 27 Units into the skin at bedtime.       Disposition and follow-up:   [name] was discharged from [facility] in Stable condition.  At the hospital follow up visit please address:  1. DKA: Please ensure that patient is compliant with discharge medications and has available insulin.     HEP B: To follow-up with ID on 10/01/2017.   2.  Labs / imaging needed at time of follow-up: CMP recommended  3.  Pending labs/ test needing follow-up: Hep C antibody, Hep A antibody, Hep E antibody, Hep B DNA pending   Follow-up Appointments: Follow-up [place] for Infectious Disease Follow up.   Specialty:  Infectious Diseases Why:  Appointment with Dr. Tommy Medal on October 01, 2017 at [facility] information: [place], [place] [id number] [address] [place]. Go on 09/11/2017.   Why:  You have an appointment scheduled for 9:15 am, please call us if this appointment  does not work for your schedule  Contact information: [address] [place] [address]. Go on 09/30/2017.   Why:  3pm with Dr.Newlin Contact information: [place] [address] [phone]       [facility] Follow up.   Specialty:  Home Health Services Why:  home health services arranged Contact information: [place] [address] [phone]          Hospital Course by problem list: Active Problems:   Encephalopathy acute   DKA (diabetic  ketoacidoses) ([place])   Hepatitis B infection   DKA secondary to medication noncompliance and recent marijuana use The patient presented with acute encephalopathy. On admission patient's labs were remarkable for Glu=1273, bicarb=11,AG=33 which are consistent with DKA. Patient's DKA is thought to be due to mediation noncompliance and marijuana provocation. CXR on admission was clear and Procalcitonin was mildly elevated and therefore patient was placed on empiric zosyn for possible aspiration.  CT head was without any acute intracranial abnormalities. He was admitted to ICU for 2 days and transferred to floor on 3/31. The patient was given fluids and insulin ggt till gap closed x2.  The patient did not need airway management as he was continually protecting airway. He rapidly improved following his ICU discharge but remained weak requiring home health assistance as he did not qualify for skilled nursing placement as per Medicaid. Glucose control was adjusted with recommendations as below.   Acute encephalopathy:  Resolved with improving metabolic state, DKA.  Insulin Dependent Diabetes: Continue home 5U TID with meals and mixed 70/30 but at a lower dose of 18 units twice daily, once with breakfast and again with supper.   Prior to discharge, the patient was instead provided with a sample of Levemir 27U nightly and Novolog 9U TID with meals instead of the 70/30 mix and novolog 5U TID. This was in an effort to simplify the patients regimen and improve the likelihood of compliance. I suspect however, that he may again become confused as there were several disjunct regimens suggested to the patient. He may ultimately fail to be compliant again in the future given his history combined with the confusion. In an attempt to mitigate this risk we attempted to counsel the patient on several occassions regarding his insulin, but I am not sure the consensus was there.   Hepatitis B infection: The patient was  observed to have a positive HepB E Ag.  ID was consulted and will follow up with patient as an outpatient. Patient's hep C antibody, hep A antibody, hep E antibody, hep B DNA pending although his last hep B DNA and hep B e antibody were negative on 09/01/2017.  The patient's hepatic function panel continue to demonstrate mild elevation in his AST, ALT 2377, 235 respectively.  Alk phos elevated to 142 stable when compared to previous evaluations.  HFrEF: Patient's echo this admission revealed an LVEF of 40-45% with mild LVH and normal CVP.  Given his  hypotensive state during this admission, we are still unable to add an ACE inhibitor, beta-blocker, diuretic and or long-acting nitro.  He will need to follow-up with an outpatient provider and adjust his medications as permitted. I would recommend an ACEi, beta-blocker, and possibly a diuretic if indicated as his BP improves.   Discharge Vitals:   BP 113/88 (BP Location: Right Arm)   Pulse 90  Temp 97.6 F (36.4 C) (Oral)   Resp 16   Ht '6\' 1"'  (1.854 m)   Wt 190 lb 6.4 oz (86.4 kg)   SpO2 100%   BMI 25.12 kg/m   Pertinent Labs, Studies, and Procedures:  BMP Latest Ref Rng & Units 09/04/2017 09/03/2017 09/02/2017  Glucose 65 - 99 mg/dL 155(H) 269(H) 300(H)  BUN 6 - 20 mg/dL 17 23(H) 22(H)  Creatinine 0.61 - 1.24 mg/dL 1.08 1.00 1.09  BUN/Creat Ratio 9 - 20 - - -  Sodium 135 - 145 mmol/L 128(L) 127(L) 131(L)  Potassium 3.5 - 5.1 mmol/L 4.2 3.9 4.4  Chloride 101 - 111 mmol/L 95(L) 94(L) 98(L)  CO2 22 - 32 mmol/L '26 26 28  ' Calcium 8.9 - 10.3 mg/dL 8.0(L) 8.0(L) 8.3(L)   CBC Latest Ref Rng & Units 08/31/2017 08/30/2017 08/30/2017  WBC 4.0 - 10.5 K/uL 23.8(H) - -  Hemoglobin 13.0 - 17.0 g/dL 10.2(L) 13.6 14.3  Hematocrit 39.0 - 52.0 % 29.8(L) 40.0 42.0  Platelets 150 - 400 K/uL 216 - -   Ct head (08/30/17): CT head without any acute intracranial abnormalities  Chest xray (08/30/17): No active disease.  Echo 09/01/2017: Study Conclusions - Left  ventricle: The cavity size was normal. Wall thickness was   increased in a pattern of mild LVH. Systolic function was mildly   to moderately reduced. The estimated ejection fraction was in the   range of 40% to 45%. Left ventricular diastolic function   parameters were normal.  Discharge Instructions: Discharge Instructions    Ambulatory referral to Physical Therapy   Complete by:  As directed    Strengthening exercises for deconditioning especially of the lower extremities   Call MD for:  difficulty breathing, headache or visual disturbances   Complete by:  As directed    Call MD for:  persistant nausea and vomiting   Complete by:  As directed    Diet - low sodium heart healthy   Complete by:  As directed    Discharge instructions   Complete by:  As directed    Thank you for allowing [place] to take care of you this admission.  - Start taking 18 units of the 70/30 twice daily and 5 units of the novolog with meals  - Follow up with our clinic on the date and time listed on your discharge paperwork  - please follow up with the infectious disease doctors at the time listed on your discharge paperwork  Please call our clinic if you have any questions or concerns, we may be able to help and keep you from a long and expensive emergency room wait. Our clinic and after hours phone number is [phone], there is always someone available.   Increase activity slowly   Complete by:  As directed      Signed: Kathi Ludwig, MD 09/05/2017, 7:06 AM   Pager: [phone]

## 2017-09-02 DIAGNOSIS — Z91120 Patient's intentional underdosing of medication regimen due to financial hardship: Secondary | ICD-10-CM

## 2017-09-02 DIAGNOSIS — F329 Major depressive disorder, single episode, unspecified: Secondary | ICD-10-CM

## 2017-09-02 DIAGNOSIS — Z794 Long term (current) use of insulin: Secondary | ICD-10-CM

## 2017-09-02 DIAGNOSIS — Z8673 Personal history of transient ischemic attack (TIA), and cerebral infarction without residual deficits: Secondary | ICD-10-CM

## 2017-09-02 DIAGNOSIS — I10 Essential (primary) hypertension: Secondary | ICD-10-CM

## 2017-09-02 DIAGNOSIS — I5020 Unspecified systolic (congestive) heart failure: Secondary | ICD-10-CM

## 2017-09-02 DIAGNOSIS — I2510 Atherosclerotic heart disease of native coronary artery without angina pectoris: Secondary | ICD-10-CM

## 2017-09-02 DIAGNOSIS — L818 Other specified disorders of pigmentation: Secondary | ICD-10-CM

## 2017-09-02 DIAGNOSIS — B169 Acute hepatitis B without delta-agent and without hepatic coma: Secondary | ICD-10-CM

## 2017-09-02 DIAGNOSIS — Z87891 Personal history of nicotine dependence: Secondary | ICD-10-CM

## 2017-09-02 DIAGNOSIS — B1910 Unspecified viral hepatitis B without hepatic coma: Secondary | ICD-10-CM

## 2017-09-02 LAB — BASIC METABOLIC PANEL
Anion gap: 5 (ref 5–15)
BUN: 22 mg/dL — AB (ref 6–20)
CHLORIDE: 98 mmol/L — AB (ref 101–111)
CO2: 28 mmol/L (ref 22–32)
CREATININE: 1.09 mg/dL (ref 0.61–1.24)
Calcium: 8.3 mg/dL — ABNORMAL LOW (ref 8.9–10.3)
GFR calc Af Amer: >60 mL/min (ref >60)
GFR calc non Af Amer: >60 mL/min (ref >60)
Glucose, Bld: 300 mg/dL — ABNORMAL HIGH (ref 65–99)
Potassium: 4.4 mmol/L (ref 3.5–5.1)
Sodium: 131 mmol/L — ABNORMAL LOW (ref 135–145)

## 2017-09-02 LAB — GLUCOSE, CAPILLARY
Glucose-Capillary: 129 mg/dL — ABNORMAL HIGH (ref 65–99)
Glucose-Capillary: 189 mg/dL — ABNORMAL HIGH (ref 65–99)
Glucose-Capillary: 26 mg/dL — CL (ref 65–99)
Glucose-Capillary: 325 mg/dL — ABNORMAL HIGH (ref 65–99)
Glucose-Capillary: 331 mg/dL — ABNORMAL HIGH (ref 65–99)
Glucose-Capillary: 34 mg/dL — CL (ref 65–99)
Glucose-Capillary: 356 mg/dL — ABNORMAL HIGH (ref 65–99)
Glucose-Capillary: 57 mg/dL — ABNORMAL LOW (ref 65–99)

## 2017-09-02 LAB — MAGNESIUM: Magnesium: 1.7 mg/dL (ref 1.7–2.4)

## 2017-09-02 LAB — HEPATITIS B DNA, ULTRAQUANTITATIVE, PCR: HBV DNA SERPL PCR-LOG IU: UNDETERMINED log10 IU/mL

## 2017-09-02 LAB — HEPATITIS B E ANTIGEN: Hep B E Ag: POSITIVE — AB

## 2017-09-02 LAB — PHOSPHORUS: Phosphorus: 2.6 mg/dL (ref 2.5–4.6)

## 2017-09-02 MED ORDER — INSULIN GLARGINE 100 UNIT/ML ~~LOC~~ SOLN
22.0000 [IU] | Freq: Every day | SUBCUTANEOUS | Status: DC
  Administered 2017-09-02: 22 [IU] via SUBCUTANEOUS
  Filled 2017-09-02: qty 0.22

## 2017-09-02 MED ORDER — PANTOPRAZOLE SODIUM 40 MG PO TBEC
40.0000 mg | DELAYED_RELEASE_TABLET | Freq: Every day | ORAL | Status: DC
  Administered 2017-09-02 – 2017-09-04 (×3): 40 mg via ORAL
  Filled 2017-09-02 (×3): qty 1

## 2017-09-02 MED ORDER — INSULIN ASPART 100 UNIT/ML ~~LOC~~ SOLN
7.0000 [IU] | Freq: Three times a day (TID) | SUBCUTANEOUS | Status: DC
  Administered 2017-09-02 (×2): 7 [IU] via SUBCUTANEOUS

## 2017-09-02 MED ORDER — TRAMADOL HCL 50 MG PO TABS
50.0000 mg | ORAL_TABLET | Freq: Two times a day (BID) | ORAL | Status: DC | PRN
  Administered 2017-09-02 – 2017-09-03 (×2): 50 mg via ORAL
  Filled 2017-09-02 (×2): qty 1

## 2017-09-02 MED ORDER — VITAMIN B-1 100 MG PO TABS
100.0000 mg | ORAL_TABLET | Freq: Every day | ORAL | Status: DC
  Administered 2017-09-03 – 2017-09-04 (×2): 100 mg via ORAL
  Filled 2017-09-02 (×2): qty 1

## 2017-09-02 MED ORDER — INSULIN ASPART 100 UNIT/ML ~~LOC~~ SOLN
7.0000 [IU] | Freq: Two times a day (BID) | SUBCUTANEOUS | Status: DC
  Administered 2017-09-03: 7 [IU] via SUBCUTANEOUS

## 2017-09-02 MED ORDER — INSULIN ASPART PROT & ASPART (70-30 MIX) 100 UNIT/ML ~~LOC~~ SUSP: 20.0000 [IU] | Freq: Every day | SUBCUTANEOUS | Status: DC

## 2017-09-02 MED ORDER — INSULIN ASPART PROT & ASPART (70-30 MIX) 100 UNIT/ML ~~LOC~~ SUSP
28.0000 [IU] | Freq: Every day | SUBCUTANEOUS | Status: DC
  Administered 2017-09-02: 28 [IU] via SUBCUTANEOUS
  Filled 2017-09-02: qty 10

## 2017-09-02 MED ORDER — CYCLOBENZAPRINE HCL 5 MG PO TABS
5.0000 mg | ORAL_TABLET | Freq: Three times a day (TID) | ORAL | Status: DC | PRN
  Administered 2017-09-02 – 2017-09-04 (×3): 5 mg via ORAL
  Filled 2017-09-02 (×3): qty 1

## 2017-09-02 NOTE — Evaluation (Signed)
Physical Therapy Evaluation Patient Details Name: Matthew Riley MRN: [id number] DOB: 1975/09/24 Today's Date: 09/02/2017   History of Present Illness  Pt is a 42 yo male who was found in bathroom unresponsive. Blood sugar >1300. PMH: CAD, DM, GERD, Hep B, HTN, Renal d/o, stroke 9/218.  Clinical Impression  Pt functioning near baseline however unsteady and requires 24/7 assist for safe d/c home at this time. Pt with c/o "my legs are tight", "they feel weird". Pt unsteady with walker and is at an increased falls risk. Pt is home alone during the day. Pt would benefit from SNF upon d/c to achieve safe mod I level of function for safe transition home.    Follow Up Recommendations SNF;Supervision/Assistance - 24 hour(pending progress, may progress and benefit from HHPT instead)    Equipment Recommendations  None recommended by PT    Recommendations for Other Services       Precautions / Restrictions Precautions Precautions: Fall Restrictions Weight Bearing Restrictions: No      Mobility  Bed Mobility Overal bed mobility: Modified Independent             General bed mobility comments: pt pulled self to long sit and reported dizziness, gathered barrings and was able to bring LEs off EOB  Transfers Overall transfer level: Needs assistance Equipment used: Rolling walker (2 wheeled) Transfers: Sit to/from Stand Sit to Stand: Min assist         General transfer comment: minA to steady pt during push up from bed and transition of hands from bed to RW  Ambulation/Gait Ambulation/Gait assistance: Min assist Ambulation Distance (Feet): 150 Feet Assistive device: Rolling walker (2 wheeled) Gait Pattern/deviations: Step-through pattern;Decreased stride length;Ataxic Gait velocity: slow Gait velocity interpretation: Below normal speed for age/gender General Gait Details: pt unsteady, minA to navigate around obstacles. Pt with L LE ataxia but no knee buckling. Pt dependent on  RW.  Stairs            Wheelchair Mobility    Modified Rankin (Stroke Patients Only)       Balance Overall balance assessment: Needs assistance Sitting-balance support: No upper extremity supported;Feet supported Sitting balance-Leahy Scale: Good     Standing balance support: Bilateral upper extremity supported Standing balance-Leahy Scale: Poor Standing balance comment: dependent on RW                             Pertinent Vitals/Pain Pain Assessment: No/denies pain    Home Living Family/patient expects to be discharged to:: Private residence Living Arrangements: Other relatives(sister and 2 neices) Available Help at Discharge: Family;Available PRN/intermittently Type of Home: House Home Access: Level entry     Home Layout: Two level;Bed/bath upstairs;1/2 bath on main level Home Equipment: Walker - 2 wheels;Shower seat;Bedside commode      Prior Function Level of Independence: Independent with assistive device(s)         Comments: uses RW for all ambulation, uses shower seat in shower, doesnt drive     Hand Dominance   Dominant Hand: Left    Extremity/Trunk Assessment   Upper Extremity Assessment Upper Extremity Assessment: Generalized weakness(L UE slighly weaker than R from previous stroke)    Lower Extremity Assessment Lower Extremity Assessment: Generalized weakness(L UE slighly weaker than R from previous stroke)    Cervical / Trunk Assessment Cervical / Trunk Assessment: Normal  Communication   Communication: No difficulties  Cognition Arousal/Alertness: Awake/alert Behavior During Therapy: WFL for tasks assessed/performed Overall  Cognitive Status: Within Functional Limits for tasks assessed                                 General Comments: pt reoriented to date but knew it was beginning of april      General Comments      Exercises     Assessment/Plan    PT Assessment Patient needs continued PT  services  PT Problem List Decreased strength;Decreased activity tolerance;Decreased balance;Decreased mobility;Decreased knowledge of use of DME;Decreased safety awareness;Decreased cognition;Decreased coordination       PT Treatment Interventions DME instruction;Gait training;Stair training;Functional mobility training;Therapeutic activities;Therapeutic exercise;Balance training;Neuromuscular re-education;Cognitive remediation;Patient/family education    PT Goals (Current goals can be found in the Care Plan section)  Acute Rehab PT Goals Patient Stated Goal: get stronger PT Goal Formulation: With patient Time For Goal Achievement: 09/16/17 Potential to Achieve Goals: Good    Frequency Min 3X/week   Barriers to discharge Decreased caregiver support alone during the day    Co-evaluation               AM-PAC PT "6 Clicks" Daily Activity  Outcome Measure Difficulty turning over in bed (including adjusting bedclothes, sheets and blankets)?: None Difficulty moving from lying on back to sitting on the side of the bed? : None Difficulty sitting down on and standing up from a chair with arms (e.g., wheelchair, bedside commode, etc,.)?: Unable Help needed moving to and from a bed to chair (including a wheelchair)?: A Little Help needed walking in hospital room?: A Little Help needed climbing 3-5 steps with a railing? : A Lot 6 Click Score: 17    End of Session Equipment Utilized During Treatment: Gait belt Activity Tolerance: Patient tolerated treatment well Patient left: in chair;with chair alarm set Nurse Communication: Mobility status PT Visit Diagnosis: Unsteadiness on feet (R26.81)    Time: 1420-1446 PT Time Calculation (min) (ACUTE ONLY): 26 min   Charges:   PT Evaluation $PT Eval Moderate Complexity: 1 Mod PT Treatments $Gait Training: 8-22 mins   PT G Codes:        Lewis Shock, PT, DPT Pager #: [phone] Office #: [phone]   Maddalena Linarez M Paralee Pendergrass 09/02/2017,  2:56 PM

## 2017-09-02 NOTE — Progress Notes (Signed)
Patent able to void 275 ml after Foley was removed. Will continue to monitor.

## 2017-09-02 NOTE — Progress Notes (Signed)
Foley was D/C per MD order. Will continue to monitor. °

## 2017-09-02 NOTE — Consult Note (Signed)
Date of Admission:  08/30/2017          Reason for Consult: hepatitis B infection   Referring Provider: Dr. Mikey Bussing   Assessment: 1. Hepatitis B infection of uncertain chronicity though he had + Sag in  December and Hep B E Ag + now 2. History of former heavy alcohol use 3. Poorly controlled IDDM w DKA 4. Hx of CVA  Plan: 1. I will get him an appt to see me in April and attempting to do this myself tentatively trying for April 29th at 2pm 2. Check Hep A total ab 3. Check [place] elastography 4. He needs to refrain from alcohol use  Active Problems:   Encephalopathy acute   DKA (diabetic ketoacidoses) (HCC)   Hepatitis B infection   Scheduled Meds: . heparin  5,000 Units Subcutaneous Q8H  . insulin aspart  0-15 Units Subcutaneous TID WC  . [START ON 09/03/2017] insulin aspart  7 Units Subcutaneous BID WC  . insulin aspart protamine- aspart  28 Units Subcutaneous Q supper  . mouth rinse  15 mL Mouth Rinse BID  . pantoprazole  40 mg Oral Daily  . [START ON 09/03/2017] thiamine  100 mg Oral Daily   Continuous Infusions: . sodium chloride Stopped (08/31/17 0042)   PRN Meds:.sodium chloride, acetaminophen, metoCLOPramide  HPI: Matthew Riley is a 42 y.o. male with poorly controlled insulin-dependent diabetes mellitus hypertension coronary artery disease and stroke which he suffered roughly 4-5 months ago while living in [place].  This caused him to move to [place] closer to family.  He has been managed at the health and wellness clinic.  In December he was hospitalized for episode of unresponsiveness.  During that hospitalization his transaminases were markedly elevated and he had a hepatitis panel drawn which was positive for hepatitis B surface antigen and also for hepatitis B core antibody IgM.  He has now been admitted to the hospital yet again for poorly controlled diabetes with diabetic ketoacidosis.  His transaminases were markedly elevated up to 795/594 AST and ALT with  an elevation also in his alkaline phosphatase to 130 and his bilirubin to be over 2.  These transaminases have trended down since then.  Repeat blood work has shown that he is positive for hepatitis B E antigen at this point in time.  We have been consulted to consider management for his hepatitis B.  Patient tells me that he was unaware of the hepatitis B diagnosis until the December admission.  He denies any history of intravenous drug use he does endorse a history of prior heavy alcohol use.  He does have tattoos and has been sexually active in the past with heterosexual sex.  He denies having had sex for the last several years.  It is not clear to me what the chronicity of his hepatitis B is.  While his core antibody IgM was positive in December this antibody can be persistently positive in people with chronic hepatitis B.  If he was in the active having acute hepatitis B in December I would have expected his transaminases to have been it more elevated at that time.  But if he was it is possible he may clear it.  I think more likely that he has a chronic hepatitis B and will need to evaluate him in the context of chronic hepatitis B.  It might be helpful to get records from [place] but we do not have asked of them to him at this time.  I will check hepatitis A antibody has not sure if he has been vaccinated for hep A or has hep A immunity.  We will check another hepatitis C antibody though he denies active or former IV drug use.  He did have a false positive HIV EIA it appears in the past but tested negative again for HIV via fourth-generation testing.  He did not meet criteria for treatment at this point in time and we would need to consider such treatment in the context of his ability to follow through and take medications reliably as starting and stopping drugs that are active against hepatitis B can actually make the hepatitis B flare much worse.  I am trying to get him into my clinic and get a  clinic scheduled prior to his discharge tomorrow.  Thank you for this interesting consult and we will see him in the outpatient world.    Review of Systems: Review of Systems  Constitutional: Positive for diaphoresis and malaise/fatigue. Negative for chills, fever and weight loss.  HENT: Negative for congestion, ear pain, hearing loss and sore throat.   Eyes: Negative for blurred vision and double vision.  Respiratory: Positive for cough. Negative for sputum production, shortness of breath, wheezing and stridor.   Cardiovascular: Negative for chest pain, palpitations and leg swelling.  Gastrointestinal: Negative for abdominal pain, blood in stool, constipation, diarrhea, heartburn, melena, nausea and vomiting.  Genitourinary: Negative for dysuria, flank pain and frequency.  Musculoskeletal: Negative for joint pain and myalgias.  Neurological: Positive for headaches. Negative for dizziness, sensory change, focal weakness, loss of consciousness and weakness.  Endo/Heme/Allergies: Does not bruise/bleed easily.  Psychiatric/Behavioral: Positive for depression. Negative for substance abuse and suicidal ideas. The patient does not have insomnia.     Past Medical History:  Diagnosis Date  . Coronary artery disease   . Diabetes mellitus without complication (HCC)   . GERD (gastroesophageal reflux disease)   . Hepatitis B   . Hypertension   . Renal disorder   . Rheumatoid arthritis (HCC)   . Stroke [facility]) 02/2017    Social History   Tobacco Use  . Smoking status: Former Games developer  . Smokeless tobacco: Never Used  . Tobacco comment: reports smoking for many years, quit years ago  Substance Use Topics  . Alcohol use: No    Frequency: Never    Comment: former use, last drink early 2018  . Drug use: No    Comment: patient denies    Family History  Problem Relation Age of Onset  . Diabetes Mother   . Diabetes Father    No Known Allergies  OBJECTIVE: Blood pressure (!) 125/93,  pulse 85, temperature 98.9 F (37.2 C), temperature source Oral, resp. rate 17, height 6\' 1"  (1.854 m), weight 197 lb 12 oz (89.7 kg), SpO2 100 %.  Physical Exam  Constitutional: He is oriented to person, place, and time and well-developed, well-nourished, and in no distress. No distress.  HENT:  Head: Normocephalic and atraumatic.  Right Ear: External ear normal.  Left Ear: External ear normal.  Nose: Nose normal.  Mouth/Throat: Oropharynx is clear and moist. No oropharyngeal exudate.  Eyes: Pupils are equal, round, and reactive to light. Conjunctivae and EOM are normal. No scleral icterus.  Neck: Normal range of motion. Neck supple.  Cardiovascular: Normal rate and regular rhythm.  Pulmonary/Chest: Effort normal. No respiratory distress.  Abdominal: Soft. Bowel sounds are normal. He exhibits no distension.  Musculoskeletal: Normal range of motion. He exhibits no edema or  tenderness.  Lymphadenopathy:    He has no cervical adenopathy.  Neurological: He is alert and oriented to person, place, and time. Gait normal.  Skin: Skin is warm and dry. No rash noted. He is not diaphoretic. No erythema. No pallor.  Psychiatric: Mood, memory, affect and judgment normal.  Vitals reviewed.   Lab Results Lab Results  Component Value Date   WBC 23.8 (H) 08/31/2017   HGB 10.2 (L) 08/31/2017   HCT 29.8 (L) 08/31/2017   MCV 84.4 08/31/2017   PLT 216 08/31/2017    Lab Results  Component Value Date   CREATININE 1.09 09/02/2017   BUN 22 (H) 09/02/2017   NA 131 (L) 09/02/2017   K 4.4 09/02/2017   CL 98 (L) 09/02/2017   CO2 28 09/02/2017    Lab Results  Component Value Date   ALT 235 (H) 09/01/2017   AST 256 (H) 09/01/2017   ALKPHOS 142 (H) 09/01/2017   BILITOT 1.2 09/01/2017     Microbiology: Recent Results (from the past 240 hour(s))  Culture, blood (Routine X 2) w Reflex to ID Panel     Status: None (Preliminary result)   Collection Time: 08/30/17  3:55 PM  Result Value Ref Range  Status   Specimen Description BLOOD LEFT ANTECUBITAL  Final   Special Requests   Final    BOTTLES DRAWN AEROBIC AND ANAEROBIC Blood Culture adequate volume   Culture   Final    NO GROWTH 3 DAYS Performed at [facility], [address], [place], [place] [address]    Report Status PENDING  Incomplete  Culture, blood (Routine X 2) w Reflex to ID Panel     Status: None (Preliminary result)   Collection Time: 08/30/17  4:00 PM  Result Value Ref Range Status   Specimen Description BLOOD RIGHT FOREARM  Final   Special Requests IN PEDIATRIC BOTTLE Blood Culture adequate volume  Final   Culture   Final    NO GROWTH 3 DAYS Performed at [facility], [address], [place], [place] [address]    Report Status PENDING  Incomplete  MRSA PCR Screening     Status: None   Collection Time: 08/30/17  8:40 PM  Result Value Ref Range Status   MRSA by PCR NEGATIVE NEGATIVE Final    Comment:        The GeneXpert MRSA Assay (FDA approved for NASAL specimens only), is one component of a comprehensive MRSA colonization surveillance program. It is not intended to diagnose MRSA infection nor to guide or monitor treatment for MRSA infections. Performed at [facility], [address] [place], [place] [address]     Acey Lav, MD [facility] [phone] pager  09/02/2017, 4:01 PM

## 2017-09-02 NOTE — Progress Notes (Addendum)
Subjective: The patient was sitting up in his bed today upon entering the room. He denied acute complaints. He stated that his medication noncompliance is partially due to lack of affordable transportation and medications.     Objective:  Vital signs in last 24 hours: Vitals:   09/01/17 1800 09/01/17 1913 09/01/17 2316 09/02/17 0606  BP: (!) 130/105 (!) 149/109 129/79 (!) 125/93  Pulse: 92 89 88 85  Resp: (!) 23 20 17 17   Temp:  98.3 F (36.8 C) 99.7 F (37.6 C) 98.9 F (37.2 C)  TempSrc:  Oral Oral Oral  SpO2: 99% 100% 100% 100%  Weight:  196 lb (88.9 kg)  197 lb 12 oz (89.7 kg)  Height:  6\' 1"  (1.854 m)     ROS is negative except as per HPI.  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  Rapidly improved with closing of his X2, normalization of his blood glucose levels with IV injectable insulin.  He was transferred to the floor from the ICU and try to transition to his home insulin regimen was begun.  Plan: Acute encephalopathy: (Resolved) Thought to be most likely the result of DKA as this has improved along with improving metabolic condition. Unfortunately, this is a regular occurrence for Matthew Riley. He stated that he has insulin at home, but often  fails to use this regularly for no clear reason. He also is unable to effectively obtain timely transportation to secure his medications. He has begun the process of initiating the application for [employer] services which in conjunction with his Medicaid he should be able to obtain his medications. Well will adjust his insulin today prior to an expected discharge tomorrow.   Insulin dependent Type 1 diabetes: Presented with DKA. As above, this appears to have occurred too often. We are attempting to obtain the necessary resources to better enable the patient to remain on a proper insulin regimen moving forward.  Insulin being transitioned to his home regimen today with anticipated discharge tomorrow.  --SSI moderate  --7 units BID with breakfast and lunch --Insulin 70/30 28 units daily with supper   Hepatitis B infection: Acute with positive Hep B E Ag. Other Hep B labs in process. --HIV negative.  --ID consulted Insulin switched to home prescription due to impending discharge.  HFrEF: Prior Echo in January 2019 indicated an LV EF of 40-45%, G1DD. Patient continues to appear euvolemic on exam. Will continue to monitor. He is not currently on maximal therapy for HFrEF based on his home medication list. Given his hypotensive nature, it may be difficult to add an ACEi, BB, diuretic and long acting Nitro during this admission. He will need close outpatient follow-up to assist with medication compliance and adjustments.  Repeat Echo this admission. LV EF of 40-45% with mild LVH and normal CVP.   Dispo: Anticipated  discharge in approximately 1-2 day(s).   Lanelle Bal, MD 09/02/2017, 3:19 PM Pager: Pager# [phone]

## 2017-09-02 NOTE — Progress Notes (Signed)
The cause of his acute encephalopathy is metabolic.

## 2017-09-03 DIAGNOSIS — F1011 Alcohol abuse, in remission: Secondary | ICD-10-CM

## 2017-09-03 DIAGNOSIS — B181 Chronic viral hepatitis B without delta-agent: Secondary | ICD-10-CM

## 2017-09-03 DIAGNOSIS — N179 Acute kidney failure, unspecified: Secondary | ICD-10-CM

## 2017-09-03 DIAGNOSIS — F1290 Cannabis use, unspecified, uncomplicated: Secondary | ICD-10-CM

## 2017-09-03 LAB — HEPATIC FUNCTION PANEL
ALBUMIN: 2.1 g/dL — AB (ref 3.5–5.0)
ALK PHOS: 142 U/L — AB (ref 38–126)
ALT: 235 U/L — ABNORMAL HIGH (ref 17–63)
AST: 377 U/L — ABNORMAL HIGH (ref 15–41)
BILIRUBIN TOTAL: 1.1 mg/dL (ref 0.3–1.2)
Bilirubin, Direct: 0.4 mg/dL (ref 0.1–0.5)
Indirect Bilirubin: 0.7 mg/dL (ref 0.3–0.9)
Total Protein: 7.7 g/dL (ref 6.5–8.1)

## 2017-09-03 LAB — PHOSPHORUS: Phosphorus: 2.6 mg/dL (ref 2.5–4.6)

## 2017-09-03 LAB — BASIC METABOLIC PANEL
Anion gap: 7 (ref 5–15)
BUN: 23 mg/dL — AB (ref 6–20)
CALCIUM: 8 mg/dL — AB (ref 8.9–10.3)
CO2: 26 mmol/L (ref 22–32)
Chloride: 94 mmol/L — ABNORMAL LOW (ref 101–111)
Creatinine, Ser: 1 mg/dL (ref 0.61–1.24)
GFR calc Af Amer: >60 mL/min (ref >60)
GLUCOSE: 269 mg/dL — AB (ref 65–99)
POTASSIUM: 3.9 mmol/L (ref 3.5–5.1)
Sodium: 127 mmol/L — ABNORMAL LOW (ref 135–145)

## 2017-09-03 LAB — HEPATITIS B SURFACE ANTIGEN

## 2017-09-03 LAB — HEPATITIS B E ANTIBODY: HEP B E AB: NEGATIVE

## 2017-09-03 LAB — GLUCOSE, CAPILLARY
GLUCOSE-CAPILLARY: 260 mg/dL — AB (ref 65–99)
GLUCOSE-CAPILLARY: 144 mg/dL — AB (ref 65–99)
GLUCOSE-CAPILLARY: 41 mg/dL — AB (ref 65–99)
GLUCOSE-CAPILLARY: 54 mg/dL — AB (ref 65–99)
Glucose-Capillary: 101 mg/dL — ABNORMAL HIGH (ref 65–99)
Glucose-Capillary: 111 mg/dL — ABNORMAL HIGH (ref 65–99)
Glucose-Capillary: 231 mg/dL — ABNORMAL HIGH (ref 65–99)
Glucose-Capillary: 257 mg/dL — ABNORMAL HIGH (ref 65–99)
Glucose-Capillary: 97 mg/dL (ref 65–99)

## 2017-09-03 LAB — HEPATITIS B SURFACE AG, CONFIRM: HBSAG CONFIRMATION: POSITIVE — AB

## 2017-09-03 LAB — MAGNESIUM: MAGNESIUM: 1.7 mg/dL (ref 1.7–2.4)

## 2017-09-03 MED ORDER — INSULIN ASPART 100 UNIT/ML ~~LOC~~ SOLN
5.0000 [IU] | Freq: Three times a day (TID) | SUBCUTANEOUS | Status: DC
  Administered 2017-09-03 – 2017-09-04 (×4): 5 [IU] via SUBCUTANEOUS

## 2017-09-03 MED ORDER — INSULIN ASPART PROT & ASPART (70-30 MIX) 100 UNIT/ML ~~LOC~~ SUSP
20.0000 [IU] | Freq: Two times a day (BID) | SUBCUTANEOUS | Status: DC
  Administered 2017-09-03: 20 [IU] via SUBCUTANEOUS

## 2017-09-03 MED ORDER — INSULIN ASPART PROT & ASPART (70-30 MIX) 100 UNIT/ML ~~LOC~~ SUSP
22.0000 [IU] | Freq: Two times a day (BID) | SUBCUTANEOUS | Status: DC
  Administered 2017-09-03: 22 [IU] via SUBCUTANEOUS
  Filled 2017-09-03: qty 10

## 2017-09-03 NOTE — NC FL2 (Signed)
[place] MEDICAID FL2 LEVEL OF CARE SCREENING TOOL     IDENTIFICATION  Patient Name: Matthew Riley Birthdate: 25-Dec-1975 Sex: male Admission Date (Current Location): 08/30/2017  [facility] and [place] Number:  Producer, television/film/video and Address:  [facility], [address] [place], [place] [address]      Provider Number: [id number]  Attending Physician Name and Address:  Tyson Alias, *  Relative Name and Phone Number:       Current Level of Care: Hospital Recommended Level of Care: Skilled Nursing Facility Prior Approval Number:    Date Approved/Denied:   PASRR Number: [id number]  Discharge Plan: SNF    Current Diagnoses: Patient Active Problem List   Diagnosis Date Noted  . Hepatitis B infection 09/01/2017  . DKA (diabetic ketoacidoses) (HCC) 08/30/2017  . Hypokalemia   . Bacteremia   . PNA (pneumonia)   . Acute blood loss anemia   . Acute systolic heart failure (HCC)   . Labile blood glucose   . Hypoglycemia   . Debility   . Neuropathic pain   . Aspiration pneumonia of both lungs (HCC)   . Leukocytosis   . Acute systolic CHF (congestive heart failure) (HCC)   . Ulcers of both lower extremities (HCC)   . Rheumatoid arthritis (HCC)   . 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 Transaminitis   . Cough   . Encephalopathy acute 06/03/2017  . Acute respiratory failure with hypoxia (HCC)   . Septic shock (HCC)   . Abdominal pain 04/16/2017  . Tobacco abuse 04/16/2017  . Leg wound, right 04/16/2017  . Bilateral leg edema 04/16/2017  . History of stroke 04/16/2017  . CAD (coronary artery disease) 04/16/2017  . Hyperglycemia 04/16/2017  . Hypertension   . Uncontrolled type 1 diabetes mellitus with hyperglycemia (HCC)   . Neuropathy   . Nausea & vomiting 03/08/2017  . GERD (gastroesophageal reflux disease) 03/08/2017  . Hyponatremia 03/08/2017  . AKI (acute kidney injury) (HCC)  03/08/2017  . Hyperkalemia 03/08/2017  . DKA, type 1 (HCC)     Orientation RESPIRATION BLADDER Height & Weight     Self, Time, Situation, Place  Normal Continent Weight: 86.2 kg (190 lb 0.6 oz) Height:  6\' 1"  (185.4 cm)  BEHAVIORAL SYMPTOMS/MOOD NEUROLOGICAL BOWEL NUTRITION STATUS      Continent Diet(Please see DC Summary)  AMBULATORY STATUS COMMUNICATION OF NEEDS Skin   Limited Assist Verbally Normal                       Personal Care Assistance Level of Assistance  Bathing, Feeding, Dressing Bathing Assistance: Limited assistance Feeding assistance: Independent Dressing Assistance: Independent     Functional Limitations Info             SPECIAL CARE FACTORS FREQUENCY  PT (By licensed PT)     PT Frequency: 3x/week              Contractures      Additional Factors Info  Code Status, Allergies, Insulin Sliding Scale Code Status Info: Full Allergies Info: NKA   Insulin Sliding Scale Info: 3x dialy with meals       Current Medications (09/03/2017):  This is the current hospital active medication list Current Facility-Administered Medications  Medication Dose Route Frequency Provider Last Rate Last Dose  . 0.9 %  sodium chloride infusion  250 mL Intravenous PRN 11/03/2017, MD   Stopped at 08/31/17  1610  . acetaminophen (TYLENOL) tablet 650 mg  650 mg Oral Q6H PRN Chundi, Vahini, MD   650 mg at 09/02/17 0844  . cyclobenzaprine (FLEXERIL) tablet 5 mg  5 mg Oral TID PRN Rozann Lesches, MD   5 mg at 09/02/17 2112  . heparin injection 5,000 Units  5,000 Units Subcutaneous Q8H Lorenso Courier, MD   5,000 Units at 09/03/17 0518  . insulin aspart (novoLOG) injection 5 Units  5 Units Subcutaneous TID WC Lanelle Bal, MD   5 Units at 09/03/17 1230  . insulin aspart protamine- aspart (NOVOLOG MIX 70/30) injection 22 Units  22 Units Subcutaneous BID WC Beola Cord, MD   22 Units at 09/03/17 [phone]  . MEDLINE mouth rinse  15 mL Mouth Rinse BID Chundi,  Vahini, MD   15 mL at 09/03/17 0938  . metoCLOPramide (REGLAN) tablet 5 mg  5 mg Oral Q8H PRN Chundi, Vahini, MD   5 mg at 09/03/17 0849  . pantoprazole (PROTONIX) EC tablet 40 mg  40 mg Oral Daily Beola Cord, MD   40 mg at 09/03/17 0912  . thiamine (VITAMIN B-1) tablet 100 mg  100 mg Oral Daily Hammons, Kimberly B, RPH   100 mg at 09/03/17 0912  . traMADol (ULTRAM) tablet 50 mg  50 mg Oral Q12H PRN Rozann Lesches, MD   50 mg at 09/02/17 2112     Discharge Medications: Please see discharge summary for a list of discharge medications.  Relevant Imaging Results:  Relevant Lab Results:   Additional Information ss#[id number].    Mearl Latin, LCSWA

## 2017-09-03 NOTE — Progress Notes (Addendum)
Pt CBG at 2301 was 26. 15 grams of carbs given and follow-up cbg was 34. Gave 15 more grams of carbs and follow-up cbg was 57. Gave him 15 more grams of carbs, then follow up cbg was 101. MD aware. Will continue to monitor and treat per MD orders.

## 2017-09-03 NOTE — Clinical Social Work Note (Signed)
Clinical Social Work Assessment  Patient Details  Name: Matthew Riley MRN: [id number] Date of Birth: 1975-09-08  Date of referral:  09/03/17               Reason for consult:  Facility Placement                Permission sought to share information with:  Facility Industrial/product designer granted to share information::  Yes, Verbal Permission Granted  Name::        Agency::  SNFs  Relationship::     Contact Information:     Housing/Transportation Living arrangements for the past 2 months:  Single Family Home Source of Information:  Patient Patient Interpreter Needed:  None Criminal Activity/Legal Involvement Pertinent to Current Situation/Hospitalization:  No - Comment as needed Significant Relationships:  Siblings Lives with:  Siblings Do you feel safe going back to the place where you live?  No Need for family participation in patient care:  No (Coment)  Care giving concerns:  CSW received consult for possible SNF placement at time of discharge. CSW spoke with patient regarding PT recommendation of SNF placement at time of discharge. Patient reported that he lives with his sister but she works and is not there to help him. He states he would rather go to SNF and do some weights in their gym. Patient expressed understanding of PT recommendation and is agreeable to SNF placement at time of discharge. CSW to continue to follow and assist with discharge planning needs.   Social Worker assessment / plan:  CSW spoke with patient concerning possibility of rehab at [facility] before returning home.  Employment status:  Disabled (Comment on whether or not currently receiving Disability) Insurance information:  Medicaid In [place] PT Recommendations:  Skilled Nursing Facility Information / Referral to community resources:  Skilled Nursing Facility  Patient/Family's Response to care:  Patient would like rehab before returning home and is agreeable to a SNF in [place]. CSW encouraged  patient to consider going home since he is doing well and will have to sign over his Medicaid check and stay for a month, however, patient still wanting snf. CSW will check with facility to see if pt is eligible due to his age and walking 140ft.   Patient/Family's Understanding of and Emotional Response to Diagnosis, Current Treatment, and Prognosis:  Patient/family is realistic regarding therapy needs and expressed being hopeful for SNF placement. Patient expressed understanding of CSW role and discharge process as well as medical condition. No questions/concerns about plan or treatment.    Emotional Assessment Appearance:  Appears stated age Attitude/Demeanor/Rapport:  Other(Appropriate) Affect (typically observed):  Accepting, Appropriate Orientation:  Oriented to Self, Oriented to Place, Oriented to  Time, Oriented to Situation Alcohol / Substance use:  Not Applicable Psych involvement (Current and /or in the community):  No (Comment)  Discharge Needs  Concerns to be addressed:  Care Coordination Readmission within the last 30 days:  No Current discharge risk:  None Barriers to Discharge:  No Barriers Identified   Mearl Latin, LCSWA 09/03/2017, 1:03 PM

## 2017-09-03 NOTE — Progress Notes (Addendum)
Subjective: Patient was lying in bed today during evaluation.  He denied pain, nausea, diaphoresis, headache, visual changes, diarrhea, dysuria, UTI urinary frequency or other concerning symptoms.  He stated that at home he takes 28 units of 70/30 insulin with breakfast and again with dinner as well as 5 units of short acting insulin with each meal.  Patient attested to a diet consisting primarily of a significant amount of fruit and carbohydrates in general.    Objective:  Vital signs in last 24 hours: Vitals:   09/02/17 0606 09/02/17 1530 09/02/17 2110 09/03/17 0508  BP: (!) 125/93 99/78 (!) 127/99 (!) 118/99  Pulse: 85 98 97 90  Resp: '17 17 18 18  ' Temp: 98.9 F (37.2 C) 97.9 F (36.6 C) 99.1 F (37.3 C) 98.5 F (36.9 C)  TempSrc: Oral Oral Oral Oral  SpO2: 100% 98% 100% 100%  Weight: 197 lb 12 oz (89.7 kg)   190 lb 0.6 oz (86.2 kg)  Height:       ROS negative except as per HPI.  Physical exam: General: No acute distress, nondiaphoretic, afebrile, conversant Neuro: Alert and oriented x4 with no focal deficits on exam Cardiovascular: RRR, no rubs murmurs or gallops auscultated Pulmonary: Lung sounds clear to auscultation bilaterally GI: Abdomen is soft, nontender, nondistended Musculoskeletal: Bilateral lower extremities are non-edematous, nontender Psych: Mood is appropriate as is affect  Assessment/Plan:  Active Problems:   Encephalopathy acute   DKA (diabetic ketoacidoses) (HCC)   Hepatitis B infection  Assessment/Profile: Matthew Riley is a 42 year old male with past medical history notable for 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   He rapidly improved with closing of his anion gap X2, normalization of his blood glucose levels with resumption of injectable insulin.  He was transferred to the  floor from the ICU and transition to his home insulin regimen was begun. He was placed on a regular diet with a slightly decreased dose of his home insulin and evaluated prior to discharge.  At this time he appears to be stable for discharge later today pending evaluation by social work and case management for potential outpatient physical rehabilitation or SNF based on the severity of his symptoms.    Plan: Acute encephalopathy: (Resolved) The patient continues to demonstrate appropriate mentation without notable memory deficits. He believes himself to be at or near Baseline.  Insulin-Dependent Type 1 Diabetes: Presented with DKA.  This is resolved sounds.  We adjusted his insulin regimen per resemble his home medication doses and his diet to resemble what he will return to at home. -SSI moderate discontinued -Continue 5 units of insulin aspart 3 times daily with meals -Insulin 70/30 at a lower dose of 22 units twice daily, this will be adjusted further prior to discharge later today  Hepatitis B infection: The patient was observed to have a positive hep B E Ag.  ID was consulted and will follow up with patient as an outpatient. Patient's hep C antibody, hep A antibody, Hep E antibody, Hep B DNA pending although his last Hep B DNA and Hep B e antibody were negative on 09/01/2017.  The patient's hepatic function panel continue to demonstrate mild elevation in his AST, ALT 2377, 235 respectively.  Alk phos elevated to 142 stable when compared to previous evaluations.  HFrEF: Patient's echo this admission revealed  an LVEF of 40-45% with mild LVH and normal CVP.  Given his  hypotensive state during this admission, we are still unable to add an ACE inhibitor, beta-blocker, diuretic and or long-acting nitro.  He will need to follow-up with an outpatient provider and adjust his medications as permitted.  The patient appears stable for discharge home today with outpatient physical therapy vs NSF.    Dispo: Anticipated discharge in approximately 0 day(s).   Kathi Ludwig, MD 09/03/2017, 6:54 AM Pager: Pager# [phone]

## 2017-09-03 NOTE — Progress Notes (Addendum)
Patient's CBG was 41 at 1424 o'clock. He received 15 grams of carbs and 240 cc Orange Juice. MD made aware. Follow up CBG was 54. Patient received more 240 cc Orange Juice and CBG came up to 111. Will continue to monitor.

## 2017-09-03 NOTE — Progress Notes (Signed)
Physical Therapy Treatment Patient Details Name: Matthew Riley MRN: [id number] DOB: 07/09/1975 Today's Date: 09/03/2017    History of Present Illness Pt is a 42 yo male who was found in bathroom unresponsive. Blood sugar >1300. PMH: CAD, DM, GERD, Hep B, HTN, Renal d/o, stroke 9/218.    PT Comments    Patient is progressing very well towards their physical therapy goals. Demonstrates improved ambulation distance, balance, and requiring less overall assist for functional mobility. Able to climb 8 steps with bilateral railings and min guard. Patient continuing to complain of muscle tightness; improved with manual stretching and ambulation. Based on examination, recommending supervision for mobility and HHPT at this point.     Follow Up Recommendations  Supervision for mobility/OOB;Home health PT     Equipment Recommendations  None recommended by PT    Recommendations for Other Services       Precautions / Restrictions Precautions Precautions: Fall Restrictions Weight Bearing Restrictions: No    Mobility  Bed Mobility Overal bed mobility: Modified Independent             General bed mobility comments: pt pulled self to long sit and then pulled BLE's off of bed  Transfers Overall transfer level: Modified independent Equipment used: Rolling walker (2 wheeled) Transfers: Sit to/from Stand Sit to Stand: Modified independent (Device/Increase time)            Ambulation/Gait Ambulation/Gait assistance: Supervision Ambulation Distance (Feet): 200 Feet Assistive device: Rolling walker (2 wheeled) Gait Pattern/deviations: Step-through pattern Gait velocity: slow Gait velocity interpretation: Below normal speed for age/gender General Gait Details: Patient with increased steadiness and decreased ataxic gait utilizing RW this session. Slow gait with good upright posture.    Stairs Stairs: Yes   Stair Management: Two rails Number of Stairs: 8 General stair comments:  Step by step pattern. VCs for sequencing  Wheelchair Mobility    Modified Rankin (Stroke Patients Only)       Balance Overall balance assessment: Needs assistance Sitting-balance support: No upper extremity supported;Feet supported Sitting balance-Leahy Scale: Good     Standing balance support: Bilateral upper extremity supported Standing balance-Leahy Scale: Fair                              Cognition Arousal/Alertness: Awake/alert Behavior During Therapy: WFL for tasks assessed/performed Overall Cognitive Status: Within Functional Limits for tasks assessed                                        Exercises      General Comments General comments (skin integrity, edema, etc.): Manual gastroc and hamstring stretching.       Pertinent Vitals/Pain      Home Living                      Prior Function            PT Goals (current goals can now be found in the care plan section) Acute Rehab PT Goals Patient Stated Goal: get stronger PT Goal Formulation: With patient Time For Goal Achievement: 09/16/17 Potential to Achieve Goals: Good Progress towards PT goals: Progressing toward goals    Frequency    Min 3X/week      PT Plan Current plan remains appropriate    Co-evaluation  AM-PAC PT "6 Clicks" Daily Activity  Outcome Measure  Difficulty turning over in bed (including adjusting bedclothes, sheets and blankets)?: None Difficulty moving from lying on back to sitting on the side of the bed? : None Difficulty sitting down on and standing up from a chair with arms (e.g., wheelchair, bedside commode, etc,.)?: None Help needed moving to and from a bed to chair (including a wheelchair)?: A Little Help needed walking in hospital room?: A Little Help needed climbing 3-5 steps with a railing? : A Little 6 Click Score: 21    End of Session Equipment Utilized During Treatment: Gait belt Activity Tolerance:  Patient tolerated treatment well Patient left: in bed;with call bell/phone within reach Nurse Communication: Mobility status PT Visit Diagnosis: Unsteadiness on feet (R26.81)     Time: 8882-8003 PT Time Calculation (min) (ACUTE ONLY): 22 min  Charges:  $Therapeutic Activity: 8-22 mins                    G Codes:      Laurina Bustle, PT, DPT Acute Rehabilitation Services  Pager: [phone]    Vanetta Mulders 09/03/2017, 4:45 PM

## 2017-09-03 NOTE — Progress Notes (Addendum)
Inpatient Diabetes Program Recommendations  AACE/ADA: New Consensus Statement on Inpatient Glycemic Control (2015)  Target Ranges:  Prepandial:   less than 140 mg/dL      Peak postprandial:   less than 180 mg/dL (1-2 hours)      Critically ill patients:  140 - 180 mg/dL   Lab Results  Component Value Date   GLUCAP 257 (H) 09/03/2017   HGBA1C 10.3 (H) 09/01/2017   Review of Glycemic Control  Diabetes history: DM 2 Outpatient Diabetes medications: Novolog 5 units tid with meals, 70/30 28 units Daily at supper Current orders for Inpatient glycemic control: 70/30 28 units Daily at supper, Novolog Moderate Correction 0-15 units tid + Novolog 7 units tid with meals  Inpatient Diabetes Program Recommendations:    Patient had hypoglycemia last night in the 20's. Patient still had Lantus 22 units on board when he got his usual home dose of 70/30 28 units at supper.   Patient only ordered 70/30 today at supper in addition to correction scale and meal coverage at breakfast and lunch.  Glucose elevated this am 257 mg/dl probably as a rebound effect. Lantus should not be active at this time.   Please decrease Novolog meal coverage to home dose 5 units breakfast and lunch.  Thanks,  Christena Deem RN, MSN, BC-ADM, [facility] Inpatient Diabetes Coordinator Team Pager [phone] (8a-5p)

## 2017-09-03 NOTE — Progress Notes (Signed)
Subjective: No new complaints   Antibiotics:  Anti-infectives (From admission, onward)   Start     Dose/Rate Route Frequency Ordered Stop   08/31/17 0200  piperacillin-tazobactam (ZOSYN) IVPB 3.375 g  Status:  Discontinued     3.375 g 12.5 mL/hr over 240 Minutes Intravenous Every 8 hours 08/30/17 1724 08/31/17 1019   08/30/17 1730  piperacillin-tazobactam (ZOSYN) IVPB 3.375 g     3.375 g 100 mL/hr over 30 Minutes Intravenous  Once 08/30/17 1724 08/30/17 2045      Medications: Scheduled Meds: . heparin  5,000 Units Subcutaneous Q8H  . insulin aspart  5 Units Subcutaneous TID WC  . insulin aspart protamine- aspart  20 Units Subcutaneous BID WC  . mouth rinse  15 mL Mouth Rinse BID  . pantoprazole  40 mg Oral Daily  . thiamine  100 mg Oral Daily   Continuous Infusions: . sodium chloride Stopped (08/31/17 0042)   PRN Meds:.sodium chloride, acetaminophen, cyclobenzaprine, metoCLOPramide, traMADol    Objective: Weight change: -5 lb 15.4 oz (-2.705 kg)  Intake/Output Summary (Last 24 hours) at 09/03/2017 1709 Last data filed at 09/03/2017 1000 Gross per 24 hour  Intake 300 ml  Output 300 ml  Net 0 ml   Blood pressure 111/81, pulse 87, temperature 98.2 F (36.8 C), temperature source Oral, resp. rate 18, height 6\' 1"  (1.854 m), weight 190 lb 0.6 oz (86.2 kg), SpO2 100 %. Temp:  [98.2 F (36.8 C)-99.1 F (37.3 C)] 98.2 F (36.8 C) (04/02 1437) Pulse Rate:  [87-97] 87 (04/02 1437) Resp:  [18] 18 (04/02 1437) BP: (111-127)/(81-99) 111/81 (04/02 1437) SpO2:  [100 %] 100 % (04/02 1437) Weight:  [190 lb 0.6 oz (86.2 kg)] 190 lb 0.6 oz (86.2 kg) (04/02 0508)  Physical Exam: General: Alert and awake, oriented x3, not in any acute distress. HEENT: , EOMI CVS regular rate, normal r,  no murmur rubs or gallops Chest: no wheezing, resp distress Abdomen: soft , nondistended, Extremities: no  clubbing or edema noted bilaterally Skin: no rashes Neuro:  nonfocal  CBC: CBC Latest Ref Rng & Units 08/31/2017 08/30/2017 08/30/2017  WBC 4.0 - 10.5 K/uL 23.8(H) - -  Hemoglobin 13.0 - 17.0 g/dL 10.2(L) 13.6 14.3  Hematocrit 39.0 - 52.0 % 29.8(L) 40.0 42.0  Platelets 150 - 400 K/uL 216 - -      BMET Recent Labs    09/02/17 0530 09/03/17 0629  NA 131* 127*  K 4.4 3.9  CL 98* 94*  CO2 28 26  GLUCOSE 300* 269*  BUN 22* 23*  CREATININE 1.09 1.00  CALCIUM 8.3* 8.0*     Liver Panel  Recent Labs    09/01/17 0623 09/03/17 0930  PROT 8.7* 7.7  ALBUMIN 2.3* 2.1*  AST 256* 377*  ALT 235* 235*  ALKPHOS 142* 142*  BILITOT 1.2 1.1  BILIDIR 0.4 0.4  IBILI 0.8 0.7       Sedimentation Rate No results for input(s): ESRSEDRATE in the last 72 hours. C-Reactive Protein No results for input(s): CRP in the last 72 hours.  Micro Results: Recent Results (from the past 720 hour(s))  Culture, blood (Routine X 2) w Reflex to ID Panel     Status: None (Preliminary result)   Collection Time: 08/30/17  3:55 PM  Result Value Ref Range Status   Specimen Description BLOOD LEFT ANTECUBITAL  Final   Special Requests   Final    BOTTLES DRAWN AEROBIC AND ANAEROBIC Blood Culture adequate volume  Culture   Final    NO GROWTH 4 DAYS Performed at [facility], [address] [place], [place] [address]    Report Status PENDING  Incomplete  Culture, blood (Routine X 2) w Reflex to ID Panel     Status: None (Preliminary result)   Collection Time: 08/30/17  4:00 PM  Result Value Ref Range Status   Specimen Description BLOOD RIGHT FOREARM  Final   Special Requests IN PEDIATRIC BOTTLE Blood Culture adequate volume  Final   Culture   Final    NO GROWTH 4 DAYS Performed at [facility], [address] [place], [place] [address]    Report Status PENDING  Incomplete  MRSA PCR Screening     Status: None   Collection Time: 08/30/17  8:40 PM  Result Value Ref Range Status   MRSA by PCR NEGATIVE NEGATIVE Final    Comment:        The  GeneXpert MRSA Assay (FDA approved for NASAL specimens only), is one component of a comprehensive MRSA colonization surveillance program. It is not intended to diagnose MRSA infection nor to guide or monitor treatment for MRSA infections. Performed at [facility], [address] [place], [place] [address]     Studies/Results: No results found.    Assessment/Plan:  INTERVAL HISTORY: Heb B DNA markedly elevated   Active Problems:   Encephalopathy acute   DKA (diabetic ketoacidoses) (HCC)   Hepatitis B infection    Matthew Riley is a 42 y.o. male with  Likely chronic hepatitis B with > 1 BILLION copies of Hep B in blood, Hep B E AG +, Sag + with comorbid brittle and poorly controlled IDDM and frequent severe DKA with acute kidney injury is and other metabolic issues.  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  He does still smoke marijuana as evidenced by his tox screen.  1.  Chronic hepatitis B without hepatic coma: I reviewed this case with Ulyses Southward, Pharm D from ID and we believe he actually has chronic HBV and clearly then meets criteria for treatment of his HBV by otitis B DNA E antigen positivity and elevated transaminases.  It was my understanding that patient has Medicaid which would not allow him to be on Vemlidy (TAF) for HBV. Vemlidy would be INFINITELY PREFERABLE  UNFortunately this is not an approved covered first-line drug in Medicaid rather we would have to treat him with Viread (TDF)  Given his frequent admissions to the hospital with DKA and acute kidney injuries I worry about the safety of Viread in this setting.  For now I am going to have him follow-up with me in the infectious disease clinic on April 30 at 2 PM and he has this appointment and has information on how to get to our clinic.  I am hoping in the interval that he gains greater control of his diabetes mellitus.  I have told him  that this is critical for him to avoid his frequent admissions with acute kidney injuries.  I have emphasized to him that if we use Viread will need to be very mindful of risk of injury to his kidneys.  I also emphasized that he would need to abstain from alcohol and other hepatotoxic drugs.  Finally emphasized the need to be highly adherent to Viread if we start this medication as his hepatitis B may flare if it is stopped.  I  spent greater than 35 minutes with the patient including greater than 50% of time in face to face counsel of the patient during our desire to treat his hepatitis B how we proceed with this and in coordination of care with Dr. Oswaldo Done.    LOS: 4 days   Acey Lav 09/03/2017, 5:09 PM

## 2017-09-04 ENCOUNTER — Encounter (HOSPITAL_COMMUNITY): Payer: Self-pay | Admitting: General Practice

## 2017-09-04 ENCOUNTER — Other Ambulatory Visit: Payer: Self-pay

## 2017-09-04 LAB — GLUCOSE, CAPILLARY
GLUCOSE-CAPILLARY: 100 mg/dL — AB (ref 65–99)
GLUCOSE-CAPILLARY: 111 mg/dL — AB (ref 65–99)
GLUCOSE-CAPILLARY: 190 mg/dL — AB (ref 65–99)
GLUCOSE-CAPILLARY: 219 mg/dL — AB (ref 65–99)
GLUCOSE-CAPILLARY: 250 mg/dL — AB (ref 65–99)
Glucose-Capillary: 131 mg/dL — ABNORMAL HIGH (ref 65–99)

## 2017-09-04 LAB — CULTURE, BLOOD (ROUTINE X 2)
CULTURE: NO GROWTH
CULTURE: NO GROWTH
SPECIAL REQUESTS: ADEQUATE
Special Requests: ADEQUATE

## 2017-09-04 LAB — HEPATITIS A ANTIBODY, TOTAL: HEP A TOTAL AB: NEGATIVE

## 2017-09-04 LAB — PHOSPHORUS: PHOSPHORUS: 2.7 mg/dL (ref 2.5–4.6)

## 2017-09-04 LAB — BASIC METABOLIC PANEL
Anion gap: 7 (ref 5–15)
BUN: 17 mg/dL (ref 6–20)
CALCIUM: 8 mg/dL — AB (ref 8.9–10.3)
CO2: 26 mmol/L (ref 22–32)
Chloride: 95 mmol/L — ABNORMAL LOW (ref 101–111)
Creatinine, Ser: 1.08 mg/dL (ref 0.61–1.24)
GFR calc Af Amer: >60 mL/min (ref >60)
Glucose, Bld: 155 mg/dL — ABNORMAL HIGH (ref 65–99)
Potassium: 4.2 mmol/L (ref 3.5–5.1)
SODIUM: 128 mmol/L — AB (ref 135–145)

## 2017-09-04 LAB — MAGNESIUM: MAGNESIUM: 1.8 mg/dL (ref 1.7–2.4)

## 2017-09-04 LAB — HEPATITIS B DNA, ULTRAQUANTITATIVE, PCR

## 2017-09-04 LAB — HBV REAL-TIME PCR, QUANT
HBV AS IU/ML: 983000000 IU/mL
LOG10 HBV AS IU/ML: 8.993 log10 IU/mL

## 2017-09-04 LAB — HEPATITIS C ANTIBODY (REFLEX): HCV AB: 0.1 {s_co_ratio} (ref 0.0–0.9)

## 2017-09-04 LAB — HCV COMMENT:

## 2017-09-04 MED ORDER — INSULIN ASPART PROT & ASPART (70-30 MIX) 100 UNIT/ML ~~LOC~~ SUSP
18.0000 [IU] | Freq: Two times a day (BID) | SUBCUTANEOUS | Status: DC
  Administered 2017-09-04: 18 [IU] via SUBCUTANEOUS
  Filled 2017-09-04: qty 10

## 2017-09-04 MED ORDER — METOCLOPRAMIDE HCL 5 MG PO TABS: 5.0000 mg | ORAL_TABLET | Freq: Three times a day (TID) | ORAL | 0 refills | Status: DC | PRN

## 2017-09-04 MED ORDER — INSULIN ASPART PROT & ASPART (70-30 MIX) 100 UNIT/ML ~~LOC~~ SUSP: 18.0000 [IU] | Freq: Every day | SUBCUTANEOUS | 2 refills | Status: DC

## 2017-09-04 MED ORDER — POLYETHYLENE GLYCOL 3350 17 G PO PACK
17.0000 g | PACK | Freq: Once | ORAL | Status: AC
  Administered 2017-09-04: 17 g via ORAL
  Filled 2017-09-04: qty 1

## 2017-09-04 MED FILL — !NOVOLOG MIX 70/30 VIAL: 70-30/ML | 28 days supply | Qty: 10 | Fill #0

## 2017-09-04 MED FILL — METOCLOPRAMIDE 5 MG TABLET: 5 | 3 days supply | Qty: 10 | Fill #0

## 2017-09-04 NOTE — Progress Notes (Signed)
Tommi Rumps to be D/C'd home with home health per MD order. Discussed with the patient and all questions fully answered.  VVS, Skin clean, dry and intact without evidence of skin break down, no evidence of skin tears noted.  IV catheter discontinued intact. Site without signs and symptoms of complications. Dressing and pressure applied.  An After Visit Summary was printed and given to the patient.  Patient escorted via WC, and D/C home via private auto.  Jon Gills  09/04/2017 7:44 PM

## 2017-09-04 NOTE — Progress Notes (Signed)
Inpatient Diabetes Program   AACE/ADA: New Consensus Statement on Inpatient Glycemic Control (2015)  Target Ranges:  Prepandial:   less than 140 mg/dL      Peak postprandial:   less than 180 mg/dL (1-2 hours)      Critically ill patients:  140 - 180 mg/dL   Lab Results  Component Value Date   GLUCAP 190 (H) 09/04/2017   HGBA1C 10.3 (H) 09/01/2017   Spoke with patient about his DM control at home. Patient takes 70/30 28 units BID and Novolog 5 units tid with meals. Patient reports checking glucose tid with meals and usually sees glucose in the 200 range.   Patient reports drinking appropriate beverages, however he says he can change some things with his meals. Patient says he really eats for his meals. He states he has not had hypoglycemia for awhile.   Discussed A1c results and glucose and A1c goals. Patient seems concerned and wating to change his portion sizes. Spoke to patient about him changing what he eats and him going home on same regimen and possibly having hypoglycemia like he is here inpatient. Discussed how we have him on a controlled diet while he is here.  Spoke with patient about being honest with his doctors about what he can improve on and wants to improve on and tell them how he is eating so they can dose medications appropriately. Told patient he has to do the work to meet the medications halfway.  Spoke to MD taking care of patient. MD will round on patient today.  Thanks,  Christena Deem RN, MSN, BC-ADM, [facility] Inpatient Diabetes Coordinator Team Pager [phone] (8a-5p)

## 2017-09-04 NOTE — Progress Notes (Signed)
Inpatient Diabetes Program Recommendations  AACE/ADA: New Consensus Statement on Inpatient Glycemic Control (2015)  Target Ranges:  Prepandial:   less than 140 mg/dL      Peak postprandial:   less than 180 mg/dL (1-2 hours)      Critically ill patients:  140 - 180 mg/dL   Review of Glycemic Control  Diabetes history: DM 2 Outpatient Diabetes medications: 70/30 28 units daily at supper, Novolog 5 units meal coverage Current orders for Inpatient glycemic control: 70/30 18 units BID, Novolog 5 units tid meal coverage  Inpatient Diabetes Program Recommendations:    70/30 18 units BID ordered yesterday in addition to Novolog 5 units tid. Meal coverage is not usually used with 70/30 insulin. The 30 portion is intended as meal coverage built in to the insulin.  Patient takes a smaller dose of 70/30 at home.   Consider reducing 70/30 dose to 14 units BID (home dose split) if used as BID dosing and D/C meal coverage and use Novolog Correction scale Sensitive 0-9 units tid.  Hypoglycemia due to 70/30 insulin in addition to meal coverage and correction scale.  Thanks,  Christena Deem RN, MSN, BC-ADM, [facility] Inpatient Diabetes Coordinator Team Pager [phone] (8a-5p)

## 2017-09-04 NOTE — Progress Notes (Signed)
Per SNF, patient doing too well to qualify for SNF level of care. Will alert RNCM to set up home health nurse for patient.   CSW signing off.  Osborne Casco Skeeter Sheard LCSW [phone]

## 2017-09-04 NOTE — Progress Notes (Signed)
Subjective: The patient was resting in his bed today upon entering the room. He denied nausea, vomiting, diaphoresis, dizziness, fatigue or other concerns. He is agreeable to discharge home today with home health to assist with his diabetes medication regimen.  Objective:  Vital signs in last 24 hours: Vitals:   09/03/17 0508 09/03/17 1437 09/03/17 2111 09/04/17 0555  BP: (!) 118/99 111/81 103/82 104/79  Pulse: 90 87 96 90  Resp: 18 18 18 18   Temp: 98.5 F (36.9 C) 98.2 F (36.8 C) 98.6 F (37 C) 98.4 F (36.9 C)  TempSrc: Oral Oral Oral Oral  SpO2: 100% 100% 100% 100%  Weight: 190 lb 0.6 oz (86.2 kg)   190 lb 6.4 oz (86.4 kg)  Height:       ROS negative except as per HPI.  PE: General: in no acute distress, afebrile Neuro: no focal deficits Cardiovascular: RRR, no murmur auscultated Pulmonary: Lungs clear to auscultation bilaterally.  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  He rapidly improved with closing of his anion gap X2, normalization of his blood glucose levels with resumption of injectable insulin. He was transferred to the floor from the ICU and transition to his home insulin regimen was begun. He was placed on a regular diet with a slightly decreased dose of his home insulin and evaluated prior to discharge.  At this time he appears to be stable for discharge later today pending evaluation by social work and case management for potential outpatient physical rehabilitation or SNF based on the severity of his symptoms.     Plan:  Acute encephalopathy: Resolved.   Insulin dependent diabetes: Titration of his insulin regimen has proven difficult. We had originally placed the patient on SSI following resolution of his DKA with meal coverage and long acting. This was discontinued in favor of his home regimen which proved to be in excess of what he could tolerate. He adamantly attested to 28U 70/30 BID and 5U novolog with meals. This caused notable hypoglycemia. He is now being titrated down to 17U 70/30 w/ breakfast/supper and with 5U TID WC. We will monitor this for the remainder of the day and recommend close outpatient follow-up. Unfortunately, his PCP's office did not have an immediate follow-up appointment available. We will arrange f/up in our clinic until he can be seen by his PCP's office. --Continue 5U TID WV --Continue 70/30 insulin at 18U w/ breakfast/supper  Hepatitis B Infection: ID indicating treatment with Viread being initiated although they would much prefer Vemlidy which is not medicaid approved. We will defer to them for treatment. Checking 09/01/17 elastography and hep A. Will do our best to continue to titrate his insulin and convince him to maintain his insulin regimen.  --F/up w/ ID on April 30th.  HFrEF: No acute issues this admission. Appears stable but not on appropriate treatment. Will continue to monitor and recommend close outpatient F/up.   Insulin titration has continued while awaiting SNF placement approval. As per the patients CSW he does not qualify for SNF and instead with be set up with home health. We will  continue to monitor his CBG following his first two meals today and most likely discharge him home with home health subsequently.   Dispo: Anticipated discharge in approximately 0-1 day(s).   Lanelle Bal, MD 09/04/2017, 7:00 AM Pager: Pager# [phone]

## 2017-09-04 NOTE — Progress Notes (Signed)
[name] is a 42 y/o T1DM who presented with DKA, now resolved, who is now being discharged. Pharmacy was consulted for insulin recommendations and patient counseling. After reviewing his profile, I recommend switching insulin therapy to Levemir 27 units nightly and 9 units of Humalog with each meal. Follow up is scheduled next week in the internal medicine clinic. All plans were discussed and approved by Dr. Wilma Flavin.  The medications were reviewed with the patient, including name, instructions, indication, goals of therapy, potential side effects, importance of adherence, and safe use.  Patient verbalized understanding by repeating back information and was advised to contact me if further medication-related questions arise. Patient was also provided an information handout on Diabetes and nutrition, which he was interested in having.  Nolen Mu PharmD PGY1 Pharmacy Practice Resident 09/04/2017 4:49 PM Pager: [phone]

## 2017-09-04 NOTE — Care Management Note (Addendum)
Case Management Note  Patient Details  Name: Matthew Riley MRN: [id number] Date of Birth: Apr 02, 1976  Subjective/Objective:                 AMS/ Hyperglycemia, hx of  diabetes, CAD, stroke, rheumatoid arthritis, hepatitis B, hypertension. Lives with sister.         Cardell Peach (Sister) Drake Leach (Daughter)    [phone]      PCP: Dr.Newlin  Action/Plan: Transition to home with health services to follow. Hospital follow up scheduled with [facility] on 09/30/2017 @ 3pm with Dr. Alvis Lemmings. Pt states niece Elonda Husky to provide transportation to home. Expected Discharge Date:  09/04/17               Expected Discharge Plan:  Home w Home Health Services  In-House Referral:  Clinical Social Work  Discharge planning Services  CM Consult, Follow-up appt scheduled, [facility]  Post Acute Care Choice:  Home Health Choice offered to:  Patient  DME Arranged:   N/A DME Agency:   N/A  HH Arranged:  RN HH Agency:   [facility]  Status of Service:  Completed, signed off  If discussed at Long Length of Stay Meetings, dates discussed:    Additional Comments:  Epifanio Lesches, RN 09/04/2017, 3:46 PM

## 2017-09-04 NOTE — Progress Notes (Signed)
Inpatient Diabetes Program Recommendations  AACE/ADA: New Consensus Statement on Inpatient Glycemic Control (2015)  Target Ranges:  Prepandial:   less than 140 mg/dL      Peak postprandial:   less than 180 mg/dL (1-2 hours)      Critically ill patients:  140 - 180 mg/dL   Lab Results  Component Value Date   GLUCAP 190 (H) 09/04/2017   HGBA1C 10.3 (H) 09/01/2017   Per previous coordinator note: "Consider reducing 70/30 dose to 14 units BID (home dose split) if used as BID dosing and D/C meal coverage and use Novolog Correction scale Sensitive 0-9 units tid. Hypoglycemia due to 70/30 insulin in addition to meal coverage and correction scale."  Discussed with provider plan of care with patient being on both 70/30 and Novolog 5 units meal coverage. Patient has poor history of compliance and has been admitted 6 times in last 6 months. Poor family support. On the current inpatient regimen, the patient has experienced multiple episodes of hypoglycemia. Given this, I suspect patient was not taking his home regimen as prescribed, in addition to receiving 70/30 and meal coverage together. Would continue to recommend DC meal coverage and use Novolog correction in addition to 70/30, see rec above. CM consult placed in attempt to get patient seen in office sooner that May. Will plan to see this patient today to strongly encourage compliance with outpatient medications.  Thanks, Lujean Rave, MSN, RNC-OB Diabetes Coordinator [phone] (8a-5p)

## 2017-09-04 NOTE — Discharge Instructions (Signed)
Please visit the [facility] on the ground floor of [facility] for a discharge follow-up. This is essential in order for [place] to continue to monitor your blood sugar levels and optimize your therapy.   Thank you for your visit to the [facility].  If you develop headache, nausea, vomiting, sweating, weakness, confusion or other concerning symptom please call your PCP or visit the local ER.

## 2017-09-05 ENCOUNTER — Telehealth: Payer: Self-pay

## 2017-09-05 LAB — HEPATITIS B E ANTIBODY: Hep B E Ab: NEGATIVE

## 2017-09-05 NOTE — Telephone Encounter (Signed)
Prior to making the discharge call, this CM noted discrepancies with the insulin orders on AVS and hospital discharge note. This was discussed with Venora Maples, RPH/[facility]  who will clarify the orders with Dr Alvis Lemmings.

## 2017-09-06 ENCOUNTER — Telehealth: Payer: Self-pay

## 2017-09-06 ENCOUNTER — Telehealth: Payer: Self-pay | Admitting: Family Medicine

## 2017-09-06 NOTE — Telephone Encounter (Addendum)
Spoke with Franky Macho, clinical pharmacist, regarding patient's medication and he informed me that patient had not been by the pharmacy to drop off rx for his insulin. Insulin was not sent electronically.   Call placed to patient after speaking with [facility] regarding his medication.  Also needed inform patient that I need to move his appointment with Franky Macho to an earlier day next week. No answer. Left patient a message asking him to return my call at [phone].

## 2017-09-06 NOTE — Telephone Encounter (Signed)
Matthew Riley from [facility] call to verify a medication he saying that he is taking a different medication he is not taking  novolog 7 units 3 x a day with a meal he been taking 9 units   Insulin 30 he is not taking that he is taking levomir at bedtime 27 units     [phone]  Admitting him at home health & physical therapy

## 2017-09-06 NOTE — Telephone Encounter (Addendum)
Spoke with Butch Penny, [facility] clinical pharmacist, regarding patient's medication and Franky Macho informed me that insulin orders were correct and approved by provider.  Call placed to patient #[phone], following his discharge to check on his status and inform him of his medication. Patient informed me that he is doing "ok". At the moment he was  home by himself. He does have support from family. He expressed that he is "moving real slow" when he does different things.   During conversation I explained to patient about his insulin medication and the changes. Patient hasn't picked up any medication yet but is planning on doing so. Expressed to patient the pharmacy hours. Patient understood. Also, scheduled patient a hospital f/u appointment for Tuesday, 4/16 at 10am and an appointment with Franky Macho [facility] pharmacist, on the same day for teaching. No further questions or concerns.

## 2017-09-09 ENCOUNTER — Telehealth: Payer: Self-pay

## 2017-09-09 NOTE — Telephone Encounter (Signed)
Pt returned Erskine Squibb call was unable to reach her. Please f/u

## 2017-09-09 NOTE — Telephone Encounter (Signed)
Call returned to the patient. He said  that he is taking levemir 27 units at bedtime and novolog 9 units three times daily with meals. He noted that he was given a bottle of levemir when he left the hospital.  He reported that his blood sugar this morning was 203.  He stated that he is aware that he has an appointment with endocrinology on 09/11/17 but needs to schedule transportation to the clinic. He confirmed that he is aware that medicaid will provide transportation to medical appointments.  Instructed him to call medicaid this afternoon to see if they will provide transportation for him with a 2 day notice.  He said that he is running out of some of his medications. Instructed him to call [facility] pharmacy to request refills for what he needs.  He said that he would then need to arrange transportation to clinic to pick up his medications.   No other problems/concerns reported.

## 2017-09-09 NOTE — Telephone Encounter (Signed)
Call placed to [place], nurse/[facility] # [phone] to confirm the insulin doses  that patient  is taking.  She stated that he is taking levemir 27 units nightly and novolog 9 units three times daily with meals.  This dosing was confirmed with Dr Alvis Lemmings and Venora Maples, RPH/[facility].   Harlin Rain stated that she plans to see the patient twice this week and then 1x/ week x 2 weeks.   Attempted to contact the patient to see how he is doing and to inquire where he got the levemir as [facility] does not have any record of him picking it up. Call placed to # [phone] and a HIPAA compliant voicemail message was left requesting a call back to #[phone].

## 2017-09-11 ENCOUNTER — Ambulatory Visit: Payer: Medicaid Other

## 2017-09-13 ENCOUNTER — Telehealth: Payer: Self-pay | Admitting: Family Medicine

## 2017-09-13 ENCOUNTER — Other Ambulatory Visit: Payer: Self-pay | Admitting: Family Medicine

## 2017-09-13 MED FILL — ATORVASTATIN 40 MG TABLET: 40 | 30 days supply | Qty: 30 | Fill #1

## 2017-09-13 NOTE — Telephone Encounter (Signed)
Call placed to patient #[phone] to check on his status and ask about his medication. No answer. Left patient a message asking him to return my call at [phone].  Spoke with Jenell Milliner, [facility] pharmacy tech, and she informed me that patient had picked up novolog 70/30 and metoclopramide 5mg  on 4/3. She was going to send a request to provider to have gabapentin and gabapentin refilled.

## 2017-09-13 NOTE — Telephone Encounter (Signed)
**  Correction: Aundra Millet was going to send a request to provider to have gabapentin and atorvastatin refilled.

## 2017-09-16 ENCOUNTER — Ambulatory Visit: Payer: Medicaid Other | Admitting: Pharmacist

## 2017-09-16 ENCOUNTER — Telehealth: Payer: Self-pay

## 2017-09-16 NOTE — Telephone Encounter (Signed)
Call placed to the patient and confirmed his appointments tomorrow at [facility] - 1000 with Dr Alvis Lemmings and 1100 with Venora Maples, [facility] to review diabetic regime.  He stated that he is taking levemir 27 units at night and novolog 9 units three times daily with meals  He reported that his blood sugar this morning was about 270.   Instructed him to bring his medications with him to his appointment tomorrow.

## 2017-09-17 ENCOUNTER — Telehealth: Payer: Self-pay | Admitting: Family Medicine

## 2017-09-17 ENCOUNTER — Ambulatory Visit (HOSPITAL_BASED_OUTPATIENT_CLINIC_OR_DEPARTMENT_OTHER): Payer: Medicaid Other | Admitting: Pharmacist

## 2017-09-17 ENCOUNTER — Encounter: Payer: Self-pay | Admitting: Family Medicine

## 2017-09-17 ENCOUNTER — Ambulatory Visit: Payer: Medicaid Other | Attending: Family Medicine | Admitting: Family Medicine

## 2017-09-17 VITALS — BP 118/82 | HR 101 | Temp 98.0°F | Ht 73.0 in | Wt 204.8 lb

## 2017-09-17 DIAGNOSIS — E1065 Type 1 diabetes mellitus with hyperglycemia: Secondary | ICD-10-CM | POA: Diagnosis not present

## 2017-09-17 DIAGNOSIS — Z79899 Other long term (current) drug therapy: Secondary | ICD-10-CM | POA: Diagnosis not present

## 2017-09-17 DIAGNOSIS — Z794 Long term (current) use of insulin: Secondary | ICD-10-CM | POA: Insufficient documentation

## 2017-09-17 DIAGNOSIS — I2510 Atherosclerotic heart disease of native coronary artery without angina pectoris: Secondary | ICD-10-CM | POA: Insufficient documentation

## 2017-09-17 DIAGNOSIS — Z8673 Personal history of transient ischemic attack (TIA), and cerebral infarction without residual deficits: Secondary | ICD-10-CM | POA: Diagnosis not present

## 2017-09-17 DIAGNOSIS — Z7982 Long term (current) use of aspirin: Secondary | ICD-10-CM | POA: Insufficient documentation

## 2017-09-17 DIAGNOSIS — E1040 Type 1 diabetes mellitus with diabetic neuropathy, unspecified: Secondary | ICD-10-CM | POA: Insufficient documentation

## 2017-09-17 DIAGNOSIS — M069 Rheumatoid arthritis, unspecified: Secondary | ICD-10-CM | POA: Insufficient documentation

## 2017-09-17 DIAGNOSIS — B181 Chronic viral hepatitis B without delta-agent: Secondary | ICD-10-CM | POA: Diagnosis not present

## 2017-09-17 DIAGNOSIS — E1042 Type 1 diabetes mellitus with diabetic polyneuropathy: Secondary | ICD-10-CM

## 2017-09-17 DIAGNOSIS — K529 Noninfective gastroenteritis and colitis, unspecified: Secondary | ICD-10-CM

## 2017-09-17 DIAGNOSIS — I69349 Monoplegia of lower limb following cerebral infarction affecting unspecified side: Secondary | ICD-10-CM | POA: Diagnosis not present

## 2017-09-17 DIAGNOSIS — K219 Gastro-esophageal reflux disease without esophagitis: Secondary | ICD-10-CM | POA: Diagnosis not present

## 2017-09-17 DIAGNOSIS — Z955 Presence of coronary angioplasty implant and graft: Secondary | ICD-10-CM | POA: Insufficient documentation

## 2017-09-17 LAB — GLUCOSE, POCT (MANUAL RESULT ENTRY): POC GLUCOSE: 234 mg/dL — AB (ref 70–99)

## 2017-09-17 MED ORDER — GABAPENTIN 300 MG PO CAPS: 300.0000 mg | ORAL_CAPSULE | Freq: Three times a day (TID) | ORAL | 3 refills | Status: DC

## 2017-09-17 MED ORDER — CYCLOBENZAPRINE HCL 5 MG PO TABS: 5.0000 mg | ORAL_TABLET | Freq: Three times a day (TID) | ORAL | 0 refills | Status: DC | PRN

## 2017-09-17 MED ORDER — INSULIN ASPART 100 UNIT/ML ~~LOC~~ SOLN: 9.0000 [IU] | Freq: Three times a day (TID) | SUBCUTANEOUS | 3 refills | Status: DC

## 2017-09-17 MED ORDER — ATORVASTATIN CALCIUM 40 MG PO TABS: 40.0000 mg | ORAL_TABLET | Freq: Every day | ORAL | 2 refills | Status: DC

## 2017-09-17 MED ORDER — INSULIN DETEMIR 100 UNIT/ML ~~LOC~~ SOLN: 27.0000 [IU] | Freq: Every day | SUBCUTANEOUS | 3 refills | Status: DC

## 2017-09-17 MED ORDER — PANTOPRAZOLE SODIUM 40 MG PO TBEC: 40.0000 mg | DELAYED_RELEASE_TABLET | Freq: Every day | ORAL | 3 refills | Status: DC

## 2017-09-17 MED ORDER — METOCLOPRAMIDE HCL 5 MG PO TABS: 5.0000 mg | ORAL_TABLET | Freq: Three times a day (TID) | ORAL | 3 refills | Status: AC | PRN

## 2017-09-17 MED ORDER — GLUCOSE BLOOD VI STRP: 1.0000 | ORAL_STRIP | Freq: Three times a day (TID) | 12 refills | Status: AC

## 2017-09-17 MED FILL — LEVEMIR 100 UNITS/ML VIAL: 100 | 28 days supply | Qty: 10 | Fill #0

## 2017-09-17 MED FILL — NovoLOG 100 UNIT/ML SOLN: 100 | 28 days supply | Qty: 10 | Fill #0

## 2017-09-17 MED FILL — GABAPENTIN 300 MG CAPSULE: 300 | 30 days supply | Qty: 90 | Fill #0

## 2017-09-17 MED FILL — CYCLOBENZAPRINE 5 MG TABLET: 5 | 30 days supply | Qty: 90 | Fill #0

## 2017-09-17 MED FILL — PANTOPRAZOLE SOD DR 40 MG T: 40 | 30 days supply | Qty: 30 | Fill #0

## 2017-09-17 NOTE — Telephone Encounter (Signed)
6 page, paperwork received through fax 09-17-17. °

## 2017-09-17 NOTE — Telephone Encounter (Signed)
Opened in error

## 2017-09-17 NOTE — Progress Notes (Signed)
     Patient presents for diabetes education at the request of Dr. Alvis Lemmings. He was seen by her this morning before coming to me. The purpose of this visit is to reinforce proper injection technique and review medication doses with him.   I covered injection technique with the patient. Additionally, we covered what dose of insulin he is supposed to be taking. Pt verbalized understanding of the plan. We discussed the importance of recording home fasting levels in addition to prandial ones. He knows to record these and bring them in to his next appointment with Dr. Alvis Lemmings. Pt was able to verbalize the appropriate hypoglycemia plan as well.   Butch Penny, PharmD Clinical Pharmacist [facility]  [phone]

## 2017-09-17 NOTE — Patient Instructions (Signed)
Thank you for coming to see me today. Please take your insulin as prescribed by Dr. Alvis Lemmings. Also, try to record you blood sugar levels at home as you are able.

## 2017-09-17 NOTE — Progress Notes (Signed)
Subjective:  Patient ID: Matthew Riley, male    DOB: Aug 15, 1975  Age: 42 y.o. MRN: [id number]  CC: Hospitalization Follow-up and Diabetes   HPI Adrean Molzahn  is a 42 year old male with a history of type 2 diabetes mellitus (A1c of 9.6), diabetic ulcers, hypertension, CVA with lower extremity weakness, CAD (status post stent), GERD who presents today for follow-up visit.  He was hospitalized at [facility] from 08/30/17 through 09/04/17 for diabetic ketoacidosis as he had presented with blood glucose of 1273 and an anion gap of 33 for which he was admitted to the ICU and commenced on insulin drip and IV fluids.  CT head was without acute intracranial abnormalities. Labs were positive for hepatitis B E antigen for which he was advised to follow-up with infectious disease outpatient. His condition improved and he was subsequently transitioned to Levemir and NovoLog and subsequently discharged.  Presents today confirming compliance with 27 units of Levemir at night and 9 units of NovoLog 3 times daily and informs me his sugars dropped to about 60 at around 10 PM.  His eating is irregular as he eats only when he is able to afford a meal.  He endorses nausea and dry heaves which occur in the morning but no abdominal pain. He has an upcoming appointment with infectious disease.  Past Medical History:  Diagnosis Date  . Coronary artery disease   . Diabetes mellitus without complication (HCC)   . DKA (diabetic ketoacidoses) (HCC) 08/2017  . GERD (gastroesophageal reflux disease)   . Hepatitis B   . Hypertension   . Renal disorder   . Rheumatoid arthritis (HCC)   . Stroke [facility]) 02/2017    Past Surgical History:  Procedure Laterality Date  . CORONARY ANGIOPLASTY WITH STENT PLACEMENT      No Known Allergies   Outpatient Medications Prior to Visit  Medication Sig Dispense Refill  . aspirin EC 81 MG tablet Take 1 tablet (81 mg total) by mouth daily. 30 tablet 11  . silver  sulfADIAZINE (SILVADENE) 1 % cream Apply 1 application topically daily. 50 g 0  . traMADol (ULTRAM) 50 MG tablet Take 1 tablet (50 mg total) by mouth every 12 (twelve) hours as needed for severe pain. 40 tablet 0  . atorvastatin (LIPITOR) 40 MG tablet Take 1 tablet (40 mg total) by mouth daily at 6 PM. 30 tablet 2  . cyclobenzaprine (FLEXERIL) 5 MG tablet Take 1 tablet (5 mg total) by mouth 3 (three) times daily as needed for muscle spasms. 90 tablet 0  . gabapentin (NEURONTIN) 300 MG capsule Take 1 capsule (300 mg total) by mouth 3 (three) times daily. 90 capsule 0  . insulin aspart (NOVOLOG) 100 UNIT/ML injection Inject 5 Units into the skin 3 (three) times daily with meals. (Patient taking differently: Inject 9 Units into the skin 3 (three) times daily with meals. ) 10 mL 3  . insulin detemir (LEVEMIR) 100 UNIT/ML injection Inject 27 Units into the skin at bedtime.    . metoCLOPramide (REGLAN) 5 MG tablet Take 1 tablet (5 mg total) by mouth every 8 (eight) hours as needed for nausea. 10 tablet 0  . pantoprazole (PROTONIX) 40 MG tablet Take 1 tablet (40 mg total) by mouth daily. 30 tablet 2  . promethazine (PHENERGAN) 25 MG tablet TAKE 1 TABLET (25 MG TOTAL) BY MOUTH EVERY 8 (EIGHT) HOURS AS NEEDED FOR NAUSEA OR VOMITING. 15 tablet 0  . polyethylene glycol (MIRALAX / GLYCOLAX) packet Take 17 g by  mouth 2 (two) times daily. (Patient not taking: Reported on 07/14/2017) 14 each 0  . thiamine 100 MG tablet Take 1 tablet (100 mg total) by mouth daily. (Patient not taking: Reported on 07/14/2017) 30 tablet 0  . insulin aspart protamine- aspart (NOVOLOG MIX 70/30) (70-30) 100 UNIT/ML injection Inject 0.18 mLs (18 Units total) into the skin daily with supper. (Patient not taking: Reported on 09/17/2017) 10 mL 2   No facility-administered medications prior to visit.     ROS Review of Systems  Constitutional: Negative for activity change and appetite change.  HENT: Negative for sinus pressure and sore  throat.   Eyes: Negative for visual disturbance.  Respiratory: Negative for cough, chest tightness and shortness of breath.   Cardiovascular: Negative for chest pain and leg swelling.  Gastrointestinal: Positive for nausea. Negative for abdominal distention, abdominal pain, constipation and diarrhea.  Endocrine: Negative.   Genitourinary: Negative for dysuria.  Musculoskeletal: Negative for joint swelling and myalgias.  Skin: Negative for rash.  Allergic/Immunologic: Negative.   Neurological: Negative for weakness, light-headedness and numbness.  Psychiatric/Behavioral: Negative for dysphoric mood and suicidal ideas.    Objective:  BP 118/82   Pulse (!) 101   Temp 98 F (36.7 C) (Oral)   Ht 6\' 1"  (1.854 m)   Wt 204 lb 12.8 oz (92.9 kg)   SpO2 98%   BMI 27.02 kg/m   BP/Weight 09/17/2017 09/04/2017 08/30/2017  Systolic BP 118 113 -  Diastolic BP 82 88 -  Wt. (Lbs) 204.8 190.4 -  BMI 27.02 - 25.12      Physical Exam  Constitutional: He is oriented to person, place, and time. He appears well-developed and well-nourished.  Cardiovascular: Normal rate, normal heart sounds and intact distal pulses.  No murmur heard. Pulmonary/Chest: Effort normal and breath sounds normal. He has no wheezes. He has no rales. He exhibits no tenderness.  Abdominal: Soft. Bowel sounds are normal. He exhibits no distension and no mass. There is no tenderness.  Musculoskeletal: Normal range of motion.  Neurological: He is alert and oriented to person, place, and time.  Skin: Skin is warm and dry.  Psychiatric: He has a normal mood and affect.     Lab Results  Component Value Date   HGBA1C 10.3 (H) 09/01/2017    Assessment & Plan:   1. Uncontrolled type 1 diabetes mellitus with hyperglycemia (HCC) Uncontrolled with A1c of 10.3 Continue current regimen Will review blood sugar log at next visit She has symptoms are suspicious for gastroparesis and gastritis. Refilled Reglan and he is to  continue his PPI Pharmacist called into assist with education and administration techniques - POCT glucose (manual entry) - insulin detemir (LEVEMIR) 100 UNIT/ML injection; Inject 0.27 mLs (27 Units total) into the skin at bedtime.  Dispense: 30 mL; Refill: 3 - insulin aspart (NOVOLOG) 100 UNIT/ML injection; Inject 9 Units into the skin 3 (three) times daily with meals.  Dispense: 30 mL; Refill: 3 - atorvastatin (LIPITOR) 40 MG tablet; Take 1 tablet (40 mg total) by mouth daily at 6 PM.  Dispense: 30 tablet; Refill: 2 - metoCLOPramide (REGLAN) 5 MG tablet; Take 1 tablet (5 mg total) by mouth every 8 (eight) hours as needed for nausea.  Dispense: 90 tablet; Refill: 3  2. Gastroesophageal reflux disease without esophagitis Stable - pantoprazole (PROTONIX) 40 MG tablet; Take 1 tablet (40 mg total) by mouth daily.  Dispense: 30 tablet; Refill: 3  4. 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 History of stroke Placed on Flexeril - cyclobenzaprine (FLEXERIL) 5 MG tablet; Take 1 tablet (5 mg total) by mouth 3 (three) times daily as needed for muscle spasms.  Dispense: 90 tablet; Refill: 0  5. 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  Dispense: 90 capsule; Refill: 3   Meds ordered this encounter  Medications  . insulin detemir (LEVEMIR) 100 UNIT/ML injection    Sig: Inject 0.27 mLs (27 Units total) into the skin at bedtime.    Dispense:  30 mL    Refill:  3  . insulin aspart (NOVOLOG) 100 UNIT/ML injection    Sig: Inject 9 Units into the skin 3 (three) times daily with meals.    Dispense:  30 mL    Refill:  3  . cyclobenzaprine (FLEXERIL) 5 MG tablet    Sig: Take 1 tablet (5 mg total) by mouth 3 (three) times daily as needed for muscle spasms.    Dispense:  90 tablet     Refill:  0  . gabapentin (NEURONTIN) 300 MG capsule    Sig: Take 1 capsule (300 mg total) by mouth 3 (three) times daily.    Dispense:  90 capsule    Refill:  3  . atorvastatin (LIPITOR) 40 MG tablet    Sig: Take 1 tablet (40 mg total) by mouth daily at 6 PM.    Dispense:  30 tablet    Refill:  2  . pantoprazole (PROTONIX) 40 MG tablet    Sig: Take 1 tablet (40 mg total) by mouth daily.    Dispense:  30 tablet    Refill:  3  . metoCLOPramide (REGLAN) 5 MG tablet    Sig: Take 1 tablet (5 mg total) by mouth every 8 (eight) hours as needed for nausea.    Dispense:  90 tablet    Refill:  3    Follow-up: Return in about 1 month (around 10/17/2017) for follow up on Diabetes.   Hoy Register MD

## 2017-09-18 ENCOUNTER — Other Ambulatory Visit: Payer: Self-pay

## 2017-09-18 MED ORDER — GLUCOSE BLOOD VI STRP: ORAL_STRIP | 12 refills | Status: AC

## 2017-09-18 MED ORDER — ACCU-CHEK SOFTCLIX LANCETS MISC: 12 refills | Status: AC

## 2017-09-18 MED ORDER — ACCU-CHEK AVIVA PLUS W/DEVICE KIT: PACK | 0 refills | Status: AC

## 2017-09-18 MED FILL — ACCU-CHEK SOFTCLIX LANCETS: 30 days supply | Qty: 100 | Fill #0

## 2017-09-18 MED FILL — ACCU-CHEK AVIVA PLUS TEST S: 30 days supply | Qty: 100 | Fill #0

## 2017-09-18 MED FILL — ACCU-CHEK AVIVA PLUS METER: W/DEVICE | 30 days supply | Qty: 1 | Fill #0

## 2017-09-30 ENCOUNTER — Ambulatory Visit: Payer: Medicaid Other | Admitting: Family Medicine

## 2017-10-01 ENCOUNTER — Inpatient Hospital Stay: Payer: Medicaid Other | Admitting: Infectious Disease

## 2017-10-03 ENCOUNTER — Ambulatory Visit: Payer: Medicaid Other | Admitting: Family Medicine

## 2017-10-03 NOTE — Progress Notes (Deleted)
.   Cardiology Office Note NEW PATIENT VISIT    Date:  10/03/2017   ID:  Matthew Riley, DOB 07/27/1975, MRN [id number]  PCP:  Newlin, Enobong, MD  Cardiologist:  NEW*  ***  No chief complaint on file.     History of Present Illness: Matthew Riley is a 42 y.o. male who is being seen today for the evaluation of *** at the request of Hairston, Mandesia R, F*.   No flowsheet data found.     Past Medical History:  Diagnosis Date  . Coronary artery disease   . Diabetes mellitus without complication (HCC)   . DKA (diabetic ketoacidoses) (HCC) 08/2017  . GERD (gastroesophageal reflux disease)   . Hepatitis B   . Hypertension   . Renal disorder   . Rheumatoid arthritis (HCC)   . Stroke (HCC) 02/2017    Past Surgical History:  Procedure Laterality Date  . CORONARY ANGIOPLASTY WITH STENT PLACEMENT       Current Outpatient Medications  Medication Sig Dispense Refill  . ACCU-CHEK SOFTCLIX LANCETS lancets Use as instructed 100 each 12  . aspirin EC 81 MG tablet Take 1 tablet (81 mg total) by mouth daily. 30 tablet 11  . atorvastatin (LIPITOR) 40 MG tablet Take 1 tablet (40 mg total) by mouth daily at 6 PM. 30 tablet 2  . Blood Glucose Monitoring Suppl (ACCU-CHEK AVIVA PLUS) w/Device KIT Check blood sugar 3 times daily. 1 kit 0  . cyclobenzaprine (FLEXERIL) 5 MG tablet Take 1 tablet (5 mg total) by mouth 3 (three) times daily as needed for muscle spasms. 90 tablet 0  . gabapentin (NEURONTIN) 300 MG capsule Take 1 capsule (300 mg total) by mouth 3 (three) times daily. 90 capsule 3  . glucose blood (ACCU-CHEK AVIVA PLUS) test strip Use as instructed 100 each 12  . glucose blood (TRUE METRIX BLOOD GLUCOSE TEST) test strip 1 each by Other route 3 (three) times daily. 100 each 12  . insulin aspart (NOVOLOG) 100 UNIT/ML injection Inject 9 Units into the skin 3 (three) times daily with meals. 30 mL 3  . insulin detemir (LEVEMIR) 100 UNIT/ML injection Inject 0.27 mLs (27 Units total) into  the skin at bedtime. 30 mL 3  . metoCLOPramide (REGLAN) 5 MG tablet Take 1 tablet (5 mg total) by mouth every 8 (eight) hours as needed for nausea. 90 tablet 3  . pantoprazole (PROTONIX) 40 MG tablet Take 1 tablet (40 mg total) by mouth daily. 30 tablet 3  . polyethylene glycol (MIRALAX / GLYCOLAX) packet Take 17 g by mouth 2 (two) times daily. (Patient not taking: Reported on 07/14/2017) 14 each 0  . silver sulfADIAZINE (SILVADENE) 1 % cream Apply 1 application topically daily. 50 g 0  . thiamine 100 MG tablet Take 1 tablet (100 mg total) by mouth daily. (Patient not taking: Reported on 07/14/2017) 30 tablet 0  . traMADol (ULTRAM) 50 MG tablet Take 1 tablet (50 mg total) by mouth every 12 (twelve) hours as needed for severe pain. 40 tablet 0   No current facility-administered medications for this visit.     Allergies:   Patient has no known allergies.    Social History:  The patient  reports that he has quit smoking. He has never used smokeless tobacco. He reports that he has current or past drug history. Drug: Marijuana. He reports that he does not drink alcohol.   Family History:  The patient's ***family history includes Diabetes in his father and mother.      ROS:  General:no colds or fevers, no weight changes Skin:no rashes or ulcers HEENT:no blurred vision, no congestion CV:see HPI PUL:see HPI GI:no diarrhea constipation or melena, no indigestion GU:no hematuria, no dysuria MS:no joint pain, no claudication Neuro:no syncope, no lightheadedness Endo:no diabetes, no thyroid disease Wt Readings from Last 3 Encounters:  09/17/17 204 lb 12.8 oz (92.9 kg)  09/04/17 190 lb 6.4 oz (86.4 kg)  07/22/17 209 lb (94.8 kg)     PHYSICAL EXAM: VS:  There were no vitals taken for this visit. , BMI There is no height or weight on file to calculate BMI. General:Pleasant affect, NAD Skin:Warm and dry, brisk capillary refill HEENT:normocephalic, sclera clear, mucus membranes moist Neck:supple,  no JVD, no bruits  Heart:S1S2 RRR without murmur, gallup, rub or click Lungs:clear without rales, rhonchi, or wheezes OIZ:TIWP, non tender, + BS, do not palpate liver spleen or masses Ext:no lower ext edema, 2+ pedal pulses, 2+ radial pulses Neuro:alert and oriented, MAE, follows commands, + facial symmetry    EKG:  EKG is ordered today. The ekg ordered today demonstrates ***   Recent Labs: 03/11/2017: TSH 0.799 06/05/2017: B Natriuretic Peptide 132.9 08/31/2017: Hemoglobin 10.2; Platelets 216 09/03/2017: ALT 235 09/04/2017: BUN 17; Creatinine, Ser 1.08; Magnesium 1.8; Potassium 4.2; Sodium 128    Lipid Panel No results found for: CHOL, TRIG, HDL, CHOLHDL, VLDL, LDLCALC, LDLDIRECT     Other studies Reviewed: Additional studies/ records that were reviewed today include: ***.   ASSESSMENT AND PLAN:  1.  ***   Current medicines are reviewed with the patient today.  The patient Has no concerns regarding medicines.  The following changes have been made:  See above Labs/ tests ordered today include:see above  Disposition:   FU:  see above  Signed, Cecilie Kicks, NP  10/03/2017 10:24 AM    [facility], [place], [place], [place] Phone: [phone]; Fax: [phone]

## 2017-10-04 ENCOUNTER — Ambulatory Visit: Payer: Medicaid Other | Admitting: Cardiology

## 2017-10-08 ENCOUNTER — Telehealth: Payer: Self-pay

## 2017-10-08 NOTE — Telephone Encounter (Signed)
PT called today to reschedule a missed HSFU with Dr. Daiva Eves on 4/30. Pt is currently scheduled to see Dr. Daiva Eves on 12/10/17 at 2:30. Pt would like to know if it would be okay to see another provider at our office who could see him before his current appt. Pt would like a call back if its okay to schedule with someone who has a earlier appt time.  Will ask Dr. Zenaida Niece dam what he thinks. Lorenso Courier, [place]

## 2017-10-08 NOTE — Telephone Encounter (Signed)
No question that is ok

## 2017-10-09 NOTE — Telephone Encounter (Signed)
Called pt to schedule an appt with Marcos Eke, NP for a HSFU. Left a vm for the pt to call me back to make an appt.  Lorenso Courier, [place]

## 2017-10-14 NOTE — Progress Notes (Signed)
Subjective:    Patient ID: Matthew Riley, male    DOB: 02/04/1976, 42 y.o.   MRN: [id number]  Chief Complaint  Patient presents with  . Establish Care    HPI:  Matthew Riley is a 42 y.o. male who presents today for initial office visit following hospitalization.  Matthew Riley was evaluated in the emergency room and admitted to the hospital with a chief complaint of altered mental status and hyperglycemia.  In December he was noted to have elevated transaminases and had a positive hepatitis B surface antigen as well as hepatitis B core antibody IgM.  His transaminases were markedly elevated with AST of 795 and ALT of 594.  Alkaline phosphatase was 130 and bilirubin over 2.  Infectious disease was consulted to consider management for hepatitis B. Although Vemlidy was preferential, he was started on Viread with instructions to remain adherent. Review of his hospital lab work shows a viral load > 1 billion with positive Hepatitis B e Antigen. CT of the abdomen showed no focal liver abnormality. He has been seen by primary care with improvement in blood sugars.   Since leaving the hospital he reports that he continues to have nausea at times that waxes and wanes. Has vomited a couple of times. Blood sugars remain labile with the most recent being about 210. Does have some weakness. Denies any Tylenol or alcohol. Denies abdominal pain, constipation, or diarrhea.   No Known Allergies    Outpatient Medications Prior to Visit  Medication Sig Dispense Refill  . ACCU-CHEK SOFTCLIX LANCETS lancets Use as instructed 100 each 12  . aspirin EC 81 MG tablet Take 1 tablet (81 mg total) by mouth daily. 30 tablet 11  . atorvastatin (LIPITOR) 40 MG tablet Take 1 tablet (40 mg total) by mouth daily at 6 PM. 30 tablet 2  . Blood Glucose Monitoring Suppl (ACCU-CHEK AVIVA PLUS) w/Device KIT Check blood sugar 3 times daily. 1 kit 0  . cyclobenzaprine (FLEXERIL) 5 MG tablet Take 1 tablet (5 mg total) by mouth 3  (three) times daily as needed for muscle spasms. 90 tablet 0  . gabapentin (NEURONTIN) 300 MG capsule Take 1 capsule (300 mg total) by mouth 3 (three) times daily. 90 capsule 3  . glucose blood (ACCU-CHEK AVIVA PLUS) test strip Use as instructed 100 each 12  . glucose blood (TRUE METRIX BLOOD GLUCOSE TEST) test strip 1 each by Other route 3 (three) times daily. 100 each 12  . insulin aspart (NOVOLOG) 100 UNIT/ML injection Inject 9 Units into the skin 3 (three) times daily with meals. 30 mL 3  . insulin detemir (LEVEMIR) 100 UNIT/ML injection Inject 0.27 mLs (27 Units total) into the skin at bedtime. 30 mL 3  . metoCLOPramide (REGLAN) 5 MG tablet Take 1 tablet (5 mg total) by mouth every 8 (eight) hours as needed for nausea. 90 tablet 3  . pantoprazole (PROTONIX) 40 MG tablet Take 1 tablet (40 mg total) by mouth daily. 30 tablet 3  . polyethylene glycol (MIRALAX / GLYCOLAX) packet Take 17 g by mouth 2 (two) times daily. 14 each 0  . silver sulfADIAZINE (SILVADENE) 1 % cream Apply 1 application topically daily. 50 g 0  . thiamine 100 MG tablet Take 1 tablet (100 mg total) by mouth daily. 30 tablet 0  . traMADol (ULTRAM) 50 MG tablet Take 1 tablet (50 mg total) by mouth every 12 (twelve) hours as needed for severe pain. 40 tablet 0   No facility-administered medications prior to  visit.      Past Medical History:  Diagnosis Date  . Coronary artery disease   . Diabetes mellitus without complication ([place])   . DKA (diabetic ketoacidoses) ([place]) 08/2017  . GERD (gastroesophageal reflux disease)   . Hepatitis B   . Hypertension   . Renal disorder   . Rheumatoid arthritis ([place])   . Stroke [facility]) 02/2017      Past Surgical History:  Procedure Laterality Date  . CORONARY ANGIOPLASTY WITH STENT PLACEMENT        Family History  Problem Relation Age of Onset  . Diabetes Mother   . Diabetes Father       Social History   Socioeconomic History  . Marital status: Single    Spouse name:  Not on file  . Number of children: Not on file  . Years of education: Not on file  . Highest education level: Not on file  Occupational History  . Not on file  Social Needs  . Financial resource strain: Not on file  . Food insecurity:    Worry: Not on file    Inability: Not on file  . Transportation needs:    Medical: Not on file    Non-medical: Not on file  Tobacco Use  . Smoking status: Former Research scientist (life sciences)  . Smokeless tobacco: Never Used  . Tobacco comment: reports smoking for many years, quit years ago  Substance and Sexual Activity  . Alcohol use: No    Frequency: Never    Comment: former use, last drink early 2018  . Drug use: Yes    Types: Marijuana    Comment: patient denies  . Sexual activity: Not on file  Lifestyle  . Physical activity:    Days per week: Not on file    Minutes per session: Not on file  . Stress: Not on file  Relationships  . Social connections:    Talks on phone: Not on file    Gets together: Not on file    Attends religious service: Not on file    Active member of club or organization: Not on file    Attends meetings of clubs or organizations: Not on file    Relationship status: Not on file  . Intimate partner violence:    Fear of current or ex partner: Not on file    Emotionally abused: Not on file    Physically abused: Not on file    Forced sexual activity: Not on file  Other Topics Concern  . Not on file  Social History Narrative  . Not on file      Review of Systems  Constitutional: Negative for chills and fever.  Eyes:       Negative for changes in vision  Respiratory: Negative for cough, chest tightness and wheezing.   Cardiovascular: Negative for chest pain, palpitations and leg swelling.  Gastrointestinal: Positive for abdominal distention, nausea and vomiting. Negative for abdominal pain, constipation and diarrhea.  Neurological: Negative for dizziness, weakness and light-headedness.       Objective:    BP (!) 163/112 (BP  Location: Left Arm, Patient Position: Sitting, Cuff Size: Normal)   Pulse 84   Temp (!) 97.4 F (36.3 C) (Oral)   Resp 18   Ht '6\' 1"'$  (1.854 m)   Wt 196 lb (88.9 kg)   SpO2 99%   BMI 25.86 kg/m  Nursing note and vital signs reviewed.  Physical Exam  Constitutional: He appears well-developed and well-nourished.  Cardiovascular: Normal rate, regular  rhythm, normal heart sounds and intact distal pulses. Exam reveals no gallop and no friction rub.  No murmur heard. Pulmonary/Chest: Effort normal and breath sounds normal. No stridor. No respiratory distress. He has no wheezes. He has no rales. He exhibits no tenderness.  Abdominal: He exhibits distension. He exhibits no mass. There is no tenderness. There is no rebound and no guarding.        Assessment & Plan:   Problem List Items Addressed This Visit      Cardiovascular and Mediastinum   Hypertension    Previously diagnosed with hypertension and not currently on medications with poorly controlled blood pressure. Neurological exam within normal limits and no chest pain or shortness of breath. Will restart previously prescribed lisinopril. Advised to monitor blood pressure at home as able and follow up with PCP as scheduled for recheck and medication adjustment. Instructed to seek emergency care if worst headache of life develops.       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 Agree with previous note by Dr. Tommy Medal that [facility] would be the most ideal choice given his increased risk for progressive kidney disease with uncontrolled diabetes and hypertension. We will start him on TDF for now and monitor his kidney function in 1 month. Advised to have strict compliance with medication regimen as abrupt stoppage may result  in flare. I will also obtain a baseline ultrasound elastography for future comparison to monitor progression should it occur.       Relevant Medications   tenofovir (VIREAD) 300 MG tablet   Other Relevant Orders   US ABDOMEN COMPLETE W/ELASTOGRAPHY       I am having Adele Barthel start on tenofovir and lisinopril. I am also having him maintain his thiamine, polyethylene glycol, aspirin EC, traMADol, silver sulfADIAZINE, insulin detemir, insulin aspart, cyclobenzaprine, gabapentin, atorvastatin, pantoprazole, metoCLOPramide, glucose blood, ACCU-CHEK AVIVA PLUS, glucose blood, and ACCU-CHEK SOFTCLIX LANCETS.   Meds ordered this encounter  Medications  . tenofovir (VIREAD) 300 MG tablet    Sig: Take 1 tablet (300 mg total) by mouth daily.    Dispense:  30 tablet    Refill:  6    Order Specific Question:   Supervising Provider    Answer:   Carlyle Basques [[id number]]  . lisinopril (PRINIVIL,ZESTRIL) 20 MG tablet    Sig: Take 1 tablet (20 mg total) by mouth daily.    Dispense:  14 tablet    Refill:  0    Order Specific Question:   Supervising Provider    Answer:   Carlyle Basques [[id number]]     Follow-up: Pharmacy in 1 month of sooner.   Mauricio Po, [facility]

## 2017-10-15 ENCOUNTER — Ambulatory Visit (INDEPENDENT_AMBULATORY_CARE_PROVIDER_SITE_OTHER): Payer: Medicaid Other | Admitting: Family

## 2017-10-15 ENCOUNTER — Encounter: Payer: Self-pay | Admitting: Family

## 2017-10-15 ENCOUNTER — Ambulatory Visit: Payer: Medicaid Other | Admitting: Cardiovascular Disease

## 2017-10-15 VITALS — BP 163/112 | HR 84 | Temp 97.4°F | Resp 18 | Ht 73.0 in | Wt 196.0 lb

## 2017-10-15 DIAGNOSIS — B181 Chronic viral hepatitis B without delta-agent: Secondary | ICD-10-CM | POA: Diagnosis not present

## 2017-10-15 DIAGNOSIS — I10 Essential (primary) hypertension: Secondary | ICD-10-CM | POA: Diagnosis not present

## 2017-10-15 MED ORDER — TENOFOVIR DISOPROXIL FUMARATE 300 MG PO TABS: 300.0000 mg | ORAL_TABLET | Freq: Every day | ORAL | 6 refills | Status: AC

## 2017-10-15 MED ORDER — LISINOPRIL 20 MG PO TABS: 20.0000 mg | ORAL_TABLET | Freq: Every day | ORAL | 0 refills | Status: DC

## 2017-10-15 MED FILL — LISINOPRIL 20 MG TAB: 20 | 14 days supply | Qty: 14 | Fill #0

## 2017-10-15 MED FILL — TENOFOVIR DISOPROXIL FUMARA: 300 | 30 days supply | Qty: 30 | Fill #0

## 2017-10-15 NOTE — Assessment & Plan Note (Signed)
Previously diagnosed with hypertension and not currently on medications with poorly controlled blood pressure. Neurological exam within normal limits and no chest pain or shortness of breath. Will restart previously prescribed lisinopril. Advised to monitor blood pressure at home as able and follow up with PCP as scheduled for recheck and medication adjustment. Instructed to seek emergency care if worst headache of life develops.

## 2017-10-15 NOTE — Patient Instructions (Addendum)
Nice to meet you!  We will schedule you for an ultrasound of your abdomen to establish a baseline for your liver.   We will get you started on medication for your Hepatitis B.  IT IS CRUCIAL ONCE YOUR START TAKING THE MEDICATION TO CONTINUE TO TAKE THE MEDICATION REGULARLY AS STOPPING THE MEDICATION ABRUPTLY MAY CAUSE A FLARE OF YOUR HEPATITIS B  We have refilled your lisinopril for your blood pressure. Please monitor your blood pressure at home daily as able. Follow up with your PCP for adjustments.

## 2017-10-15 NOTE — Assessment & Plan Note (Signed)
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 Agree with previous note by Dr. Daiva Eves that [facility]) would be the most ideal choice given his increased risk for progressive kidney disease with uncontrolled diabetes and hypertension. We will start him on TDF for now and monitor his kidney function in 1 month. Advised to have strict compliance with medication regimen as abrupt stoppage may result in flare. I will also obtain a baseline ultrasound elastography for future comparison to monitor progression should it occur.

## 2017-10-23 ENCOUNTER — Encounter: Payer: Self-pay | Admitting: Family Medicine

## 2017-10-23 ENCOUNTER — Ambulatory Visit (HOSPITAL_COMMUNITY): Payer: Medicaid Other

## 2017-10-23 ENCOUNTER — Ambulatory Visit: Payer: Medicaid Other | Attending: Family Medicine | Admitting: Family Medicine

## 2017-10-23 ENCOUNTER — Ambulatory Visit: Payer: Medicaid Other | Attending: Family Medicine | Admitting: Licensed Clinical Social Worker

## 2017-10-23 VITALS — BP 152/106 | HR 101 | Temp 97.8°F | Ht 73.0 in | Wt 135.8 lb

## 2017-10-23 DIAGNOSIS — B181 Chronic viral hepatitis B without delta-agent: Secondary | ICD-10-CM | POA: Diagnosis not present

## 2017-10-23 DIAGNOSIS — I10 Essential (primary) hypertension: Secondary | ICD-10-CM

## 2017-10-23 DIAGNOSIS — K219 Gastro-esophageal reflux disease without esophagitis: Secondary | ICD-10-CM | POA: Diagnosis not present

## 2017-10-23 DIAGNOSIS — E1065 Type 1 diabetes mellitus with hyperglycemia: Secondary | ICD-10-CM

## 2017-10-23 DIAGNOSIS — Z7982 Long term (current) use of aspirin: Secondary | ICD-10-CM | POA: Insufficient documentation

## 2017-10-23 DIAGNOSIS — Z79899 Other long term (current) drug therapy: Secondary | ICD-10-CM | POA: Insufficient documentation

## 2017-10-23 DIAGNOSIS — Z8673 Personal history of transient ischemic attack (TIA), and cerebral infarction without residual deficits: Secondary | ICD-10-CM | POA: Diagnosis not present

## 2017-10-23 DIAGNOSIS — R5383 Other fatigue: Secondary | ICD-10-CM | POA: Diagnosis not present

## 2017-10-23 DIAGNOSIS — K3184 Gastroparesis: Secondary | ICD-10-CM | POA: Diagnosis not present

## 2017-10-23 DIAGNOSIS — F419 Anxiety disorder, unspecified: Secondary | ICD-10-CM

## 2017-10-23 DIAGNOSIS — E1042 Type 1 diabetes mellitus with diabetic polyneuropathy: Secondary | ICD-10-CM

## 2017-10-23 LAB — GLUCOSE, POCT (MANUAL RESULT ENTRY): POC Glucose: 264 mg/dl — AB (ref 70–99)

## 2017-10-23 MED ORDER — INSULIN ASPART 100 UNIT/ML ~~LOC~~ SOLN: SUBCUTANEOUS | 6 refills | Status: AC

## 2017-10-23 MED ORDER — PANTOPRAZOLE SODIUM 40 MG PO TBEC: 40.0000 mg | DELAYED_RELEASE_TABLET | Freq: Every day | ORAL | 6 refills | Status: AC

## 2017-10-23 MED ORDER — LISINOPRIL 20 MG PO TABS: 20.0000 mg | ORAL_TABLET | Freq: Every day | ORAL | 6 refills | Status: DC

## 2017-10-23 MED ORDER — GABAPENTIN 300 MG PO CAPS: 300.0000 mg | ORAL_CAPSULE | Freq: Three times a day (TID) | ORAL | 3 refills | Status: DC

## 2017-10-23 MED ORDER — CYCLOBENZAPRINE HCL 5 MG PO TABS: 5.0000 mg | ORAL_TABLET | Freq: Three times a day (TID) | ORAL | 2 refills | Status: DC | PRN

## 2017-10-23 MED ORDER — INSULIN DETEMIR 100 UNIT/ML ~~LOC~~ SOLN: 29.0000 [IU] | Freq: Every day | SUBCUTANEOUS | 6 refills | Status: DC

## 2017-10-23 MED ORDER — ATORVASTATIN CALCIUM 40 MG PO TABS: 40.0000 mg | ORAL_TABLET | Freq: Every day | ORAL | 6 refills | Status: AC

## 2017-10-23 MED FILL — PANTOPRAZOLE SOD DR 40 MG T: 40 | 30 days supply | Qty: 30 | Fill #0

## 2017-10-23 MED FILL — NovoLOG 100 UNIT/ML SOLN: 100 | 27 days supply | Qty: 10 | Fill #0

## 2017-10-23 MED FILL — ATORVASTATIN CALCIUM 40 MG: 40 | 30 days supply | Qty: 30 | Fill #0

## 2017-10-23 MED FILL — LEVEMIR 100 UNITS/ML VIAL: 100 | 30 days supply | Qty: 30 | Fill #0

## 2017-10-23 MED FILL — CYCLOBENZAPRINE 5 MG TABLET: 5 | 30 days supply | Qty: 90 | Fill #0

## 2017-10-23 MED FILL — GABAPENTIN 300 MG CAPSULE: 300 | 30 days supply | Qty: 90 | Fill #0

## 2017-10-23 NOTE — Patient Instructions (Signed)

## 2017-10-23 NOTE — Progress Notes (Signed)
Subjective:  Patient ID: Matthew Riley, male    DOB: [birth year]  Age: 42 y.o. MRN: [id number]  CC: Diabetes   HPI Kirk Basquez is a 42 year old male with a history of type 2 diabetes mellitus (A1c of 10.3), diabetic ulcers, hypertension, CVA with lower extremity weakness, CAD (status post stent), GERD who presents today for follow-up visit. He has noticed his sugar dropping with Novolog and so he has been taking less than  9 units tid but he has been compliant with his Levemir and takes 29 units at bedtime.  He complains of early morning nausea and vomiting and had been prescribed Reglan at his last visit which he never obtained from the pharmacy and symptoms are not controlled on Omeprazole. He denies abdominal pain, diarrhea or constipation. He also complains of fatigue even without exertion.  His blood pressure is elevated and he endorses taking his antihypertensive today. Past Medical History:  Diagnosis Date  . Coronary artery disease   . Diabetes mellitus without complication ([place])   . DKA (diabetic ketoacidoses) ([place]) 08/2017  . GERD (gastroesophageal reflux disease)   . Hepatitis B   . Hypertension   . Renal disorder   . Rheumatoid arthritis ([place])   . Stroke [facility]) 02/2017    Past Surgical History:  Procedure Laterality Date  . CORONARY ANGIOPLASTY WITH STENT PLACEMENT      No Known Allergies   Outpatient Medications Prior to Visit  Medication Sig Dispense Refill  . ACCU-CHEK SOFTCLIX LANCETS lancets Use as instructed 100 each 12  . aspirin EC 81 MG tablet Take 1 tablet (81 mg total) by mouth daily. 30 tablet 11  . Blood Glucose Monitoring Suppl (ACCU-CHEK AVIVA PLUS) w/Device KIT Check blood sugar 3 times daily. 1 kit 0  . glucose blood (ACCU-CHEK AVIVA PLUS) test strip Use as instructed 100 each 12  . glucose blood (TRUE METRIX BLOOD GLUCOSE TEST) test strip 1 each by Other route 3 (three) times daily. 100 each 12  . metoCLOPramide (REGLAN) 5 MG tablet Take 1  tablet (5 mg total) by mouth every 8 (eight) hours as needed for nausea. 90 tablet 3  . polyethylene glycol (MIRALAX / GLYCOLAX) packet Take 17 g by mouth 2 (two) times daily. 14 each 0  . silver sulfADIAZINE (SILVADENE) 1 % cream Apply 1 application topically daily. 50 g 0  . tenofovir (VIREAD) 300 MG tablet Take 1 tablet (300 mg total) by mouth daily. 30 tablet 6  . thiamine 100 MG tablet Take 1 tablet (100 mg total) by mouth daily. 30 tablet 0  . traMADol (ULTRAM) 50 MG tablet Take 1 tablet (50 mg total) by mouth every 12 (twelve) hours as needed for severe pain. 40 tablet 0  . atorvastatin (LIPITOR) 40 MG tablet Take 1 tablet (40 mg total) by mouth daily at 6 PM. 30 tablet 2  . cyclobenzaprine (FLEXERIL) 5 MG tablet Take 1 tablet (5 mg total) by mouth 3 (three) times daily as needed for muscle spasms. 90 tablet 0  . gabapentin (NEURONTIN) 300 MG capsule Take 1 capsule (300 mg total) by mouth 3 (three) times daily. 90 capsule 3  . insulin aspart (NOVOLOG) 100 UNIT/ML injection Inject 9 Units into the skin 3 (three) times daily with meals. 30 mL 3  . insulin detemir (LEVEMIR) 100 UNIT/ML injection Inject 0.27 mLs (27 Units total) into the skin at bedtime. 30 mL 3  . lisinopril (PRINIVIL,ZESTRIL) 20 MG tablet Take 1 tablet (20 mg total) by mouth daily. [place]  tablet 0  . pantoprazole (PROTONIX) 40 MG tablet Take 1 tablet (40 mg total) by mouth daily. 30 tablet 3   No facility-administered medications prior to visit.     ROS Review of Systems  Constitutional: Positive for fatigue. Negative for activity change and appetite change.  HENT: Negative for sinus pressure and sore throat.   Eyes: Negative for visual disturbance.  Respiratory: Negative for cough, chest tightness and shortness of breath.   Cardiovascular: Negative for chest pain and leg swelling.  Gastrointestinal: Positive for nausea. Negative for abdominal distention, abdominal pain, constipation and diarrhea.  Endocrine: Negative.     Genitourinary: Negative for dysuria.  Musculoskeletal: Negative for joint swelling and myalgias.  Skin: Negative for rash.  Allergic/Immunologic: Negative.   Neurological: Positive for weakness. Negative for light-headedness and numbness.  Psychiatric/Behavioral: Negative for dysphoric mood and suicidal ideas.    Objective:  BP (!) 152/106   Pulse (!) 101   Temp 97.8 F (36.6 C) (Oral)   Ht '6\' 1"'  (1.854 m)   Wt 135 lb 12.8 oz (61.6 kg)   SpO2 96%   BMI 17.92 kg/m   BP/Weight 10/23/2017 10/15/2017 3/00/9233  Systolic BP 007 622 633  Diastolic BP 354 562 82  Wt. (Lbs) 135.8 196 204.8  BMI 17.92 25.86 27.02      Physical Exam  Constitutional: He is oriented to person, place, and time. He appears well-developed and well-nourished.  Cardiovascular: Normal heart sounds and intact distal pulses. Tachycardia present.  No murmur heard. Pulmonary/Chest: Effort normal and breath sounds normal. He has no wheezes. He has no rales. He exhibits no tenderness.  Abdominal: Soft. Bowel sounds are normal. He exhibits no distension and no mass. There is no tenderness.  Musculoskeletal: Normal range of motion.  Neurological: He is alert and oriented to person, place, and time.  Ambulates with a cane  Psychiatric: He has a normal mood and affect.    Lab Results  Component Value Date   HGBA1C 10.3 (H) 09/01/2017    Assessment & Plan:   1. Uncontrolled type 1 diabetes mellitus with hyperglycemia (HCC) Uncontrolled with A1c of 10.3 Switched from fixed Novolog regimen to sliding scale due to complains of hypoglycemia. Continue Levemir at current dose along with a diabetic diet and lifestyle modifications - POCT glucose (manual entry) - atorvastatin (LIPITOR) 40 MG tablet; Take 1 tablet (40 mg total) by mouth daily at 6 PM.  Dispense: 30 tablet; Refill: 6 - insulin aspart (NOVOLOG) 100 UNIT/ML injection; 0 to 12 units 3 times daily before meals as per sliding scale  Dispense: 30 mL; Refill:  6 - insulin detemir (LEVEMIR) 100 UNIT/ML injection; Inject 0.29 mLs (29 Units total) into the skin at bedtime.  Dispense: 30 mL; Refill: 6  2. History of stroke Risk factor modification - cyclobenzaprine (FLEXERIL) 5 MG tablet; Take 1 tablet (5 mg total) by mouth 3 (three) times daily as needed for muscle spasms.  Dispense: 90 tablet; Refill: 2  3. 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  Dispense: 90 capsule; Refill: 3  4. Essential hypertension Elevated BP Will make no regimen changes today as his BP was 118/82 at his last visit - lisinopril (PRINIVIL,ZESTRIL) 20 MG tablet; Take 1 tablet (20 mg total) by mouth daily.  Dispense: 30 tablet; Refill: 6  5. Gastroesophageal reflux disease without esophagitis Stable - pantoprazole (PROTONIX) 40 MG  tablet; Take 1 tablet (40 mg total) by mouth daily.  Dispense: 30 tablet; Refill: 6  6. Gastroparesis Advised to pick up reglan Will need to evaluate for gastroparesis. - NM Gastric Emptying; Future  7. Other fatigue - CBC with Differential/Platelet - T4, free - TSH - VITAMIN D 25 Hydroxy (Vit-D Deficiency, Fractures)  8. Chronic viral hepatitis B without delta agent and without coma (HCC) Continue Viread Currently followed bu infectious disease   Meds ordered this encounter  Medications  . atorvastatin (LIPITOR) 40 MG tablet    Sig: Take 1 tablet (40 mg total) by mouth daily at 6 PM.    Dispense:  30 tablet    Refill:  6  . cyclobenzaprine (FLEXERIL) 5 MG tablet    Sig: Take 1 tablet (5 mg total) by mouth 3 (three) times daily as needed for muscle spasms.    Dispense:  90 tablet    Refill:  2  . gabapentin (NEURONTIN) 300 MG capsule    Sig: Take 1 capsule (300 mg total) by mouth 3 (three) times daily.    Dispense:  90 capsule    Refill:  3  . insulin aspart (NOVOLOG) 100 UNIT/ML  injection    Sig: 0 to 12 units 3 times daily before meals as per sliding scale    Dispense:  30 mL    Refill:  6  . insulin detemir (LEVEMIR) 100 UNIT/ML injection    Sig: Inject 0.29 mLs (29 Units total) into the skin at bedtime.    Dispense:  30 mL    Refill:  6  . lisinopril (PRINIVIL,ZESTRIL) 20 MG tablet    Sig: Take 1 tablet (20 mg total) by mouth daily.    Dispense:  30 tablet    Refill:  6  . pantoprazole (PROTONIX) 40 MG tablet    Sig: Take 1 tablet (40 mg total) by mouth daily.    Dispense:  30 tablet    Refill:  6    Follow-up: Return in about 3 months (around 01/23/2018) for follow up of chronic medical conditions.   Charlott Rakes MD

## 2017-10-23 NOTE — BH Specialist Note (Signed)
Integrated Behavioral Health Follow Up Visit  MRN: [id number] Name: Matthew Riley  Number of Integrated Behavioral Health Clinician visits: 2/6 Session Start time: 11:30 AM  Session End time: 11:45 AM Total time: 15 minutes  Type of Service: Integrated Behavioral Health- Individual/Family Interpretor:No. Interpretor Name and Language: N/A  SUBJECTIVE: Matthew Riley is a 42 y.o. male accompanied by self Patient was referred by self for stress and anxiety regarding social security application. Patient reports the following symptoms/concerns: feelings of sadness and worry, difficulty sleeping, low energy, and irritability Duration of problem: Over six months; Severity of problem: moderate  OBJECTIVE: Mood: Anxious and Depressed and Affect: Appropriate Risk of harm to self or others: No plan to harm self or others  LIFE CONTEXT: Family and Social: Pt currently resides with his sister, who he receives emotional and financial support from  School/Work: Pt receives [employer]. He has a pending disability application on file Self-Care: Pt reports taking prescribed medication to manage physical health Life Changes: Pt has ongoing medical conditions. He report an increase in stress due to financial strain  GOALS ADDRESSED: Patient will: 1.  Reduce symptoms of: anxiety, depression and stress  2.  Increase knowledge and/or ability of: coping skills and stress reduction  3.  Demonstrate ability to: Increase adequate support systems for patient/family and Increase motivation to adhere to plan of care  INTERVENTIONS: Interventions utilized:  Mindfulness or Relaxation Training and Supportive Counseling Standardized Assessments completed: GAD-7 and PHQ 2&9  ASSESSMENT: Patient currently experiencing depression and anxiety triggered by ongoing medical conditions, stress from financial strain, and decreased independence. He reports  feelings of sadness and worry, difficulty sleeping, low energy,  and irritability. Pt receives strong support from sister, who he resides with.  Patient may benefit from psychotherapy. LCSWA validated pt's feelings and offered support/encouragement. Pt was informed about the disability process and realistic time frames. LCSWA assisted pt in gratitude thinking to promote positive feelings and relaxation.    PLAN: 1. Follow up with behavioral health clinician on : Pt was encouraged tocontact LCSWA if symptoms worsen or fail to improveto schedule behavioral appointments at [facility] 2. Behavioral recommendations: LCSWA recommends that pt apply healthy coping skills discussed. Pt is encouraged to schedule follow up appointment with LCSWA 3. Referral(s): Integrated Behavioral Health Services (In Clinic)  4. "From scale of 1-10, how likely are you to follow plan?":   Bridgett Larsson, LCSW 10/25/17 12:27 PM

## 2017-10-24 ENCOUNTER — Telehealth: Payer: Self-pay

## 2017-10-24 ENCOUNTER — Other Ambulatory Visit: Payer: Self-pay | Admitting: Family Medicine

## 2017-10-24 LAB — T4, FREE: FREE T4: 1.11 ng/dL (ref 0.82–1.77)

## 2017-10-24 LAB — CBC WITH DIFFERENTIAL/PLATELET
Basophils Absolute: 0.1 10*3/uL (ref 0.0–0.2)
Basos: 1 %
EOS (ABSOLUTE): 0.2 10*3/uL (ref 0.0–0.4)
Eos: 2 %
HEMOGLOBIN: 12.4 g/dL — AB (ref 13.0–17.7)
Hematocrit: 35.3 % — ABNORMAL LOW (ref 37.5–51.0)
Immature Grans (Abs): 0 10*3/uL (ref 0.0–0.1)
Immature Granulocytes: 0 %
LYMPHS ABS: 3.2 10*3/uL — AB (ref 0.7–3.1)
Lymphs: 39 %
MCH: 30.4 pg (ref 26.6–33.0)
MCHC: 35.1 g/dL (ref 31.5–35.7)
MCV: 87 fL (ref 79–97)
MONOCYTES: 10 %
Monocytes Absolute: 0.8 10*3/uL (ref 0.1–0.9)
Neutrophils Absolute: 3.9 10*3/uL (ref 1.4–7.0)
Neutrophils: 48 %
PLATELETS: 206 10*3/uL (ref 150–450)
RBC: 4.08 x10E6/uL — AB (ref 4.14–5.80)
RDW: 20.2 % — AB (ref 12.3–15.4)
WBC: 8.2 10*3/uL (ref 3.4–10.8)

## 2017-10-24 LAB — TSH: TSH: 1.5 u[IU]/mL (ref 0.450–4.500)

## 2017-10-24 LAB — VITAMIN D 25 HYDROXY (VIT D DEFICIENCY, FRACTURES): Vit D, 25-Hydroxy: 7.2 ng/mL — ABNORMAL LOW (ref 30.0–100.0)

## 2017-10-24 MED ORDER — ERGOCALCIFEROL 1.25 MG (50000 UT) PO CAPS: 50000.0000 [IU] | ORAL_CAPSULE | ORAL | 1 refills | Status: DC

## 2017-10-24 MED FILL — VIT D2 1.25 MG (50,000 UNIT: 1.25 MG | 28 days supply | Qty: 4 | Fill #0

## 2017-10-24 NOTE — Telephone Encounter (Signed)
-----   Message from Hoy Register, MD sent at 10/24/2017  1:32 PM EDT ----- Labs are negative for anemia, thyroid is normal. Vitamin D is low and this could explain his fatigue. I have sent a script for vitamin D replacement to his pharmacy. Thanks

## 2017-10-24 NOTE — Telephone Encounter (Signed)
Patient was called and informed to contact office for lab results.    If patient returns phone call please inform patient of lab results below. 

## 2017-10-25 ENCOUNTER — Ambulatory Visit (HOSPITAL_COMMUNITY)
Admission: RE | Admit: 2017-10-25 | Discharge: 2017-10-25 | Disposition: A | Discharge status: Home / Self Care | Payer: Medicaid Other | Source: Ambulatory Visit | Attending: Family | Admitting: Family

## 2017-10-25 DIAGNOSIS — N289 Disorder of kidney and ureter, unspecified: Secondary | ICD-10-CM | POA: Diagnosis not present

## 2017-10-25 DIAGNOSIS — B181 Chronic viral hepatitis B without delta-agent: Secondary | ICD-10-CM | POA: Insufficient documentation

## 2017-11-12 ENCOUNTER — Ambulatory Visit (INDEPENDENT_AMBULATORY_CARE_PROVIDER_SITE_OTHER): Payer: Medicaid Other | Admitting: Physician Assistant

## 2017-11-12 ENCOUNTER — Encounter: Payer: Self-pay | Admitting: Physician Assistant

## 2017-11-12 VITALS — BP 148/102 | HR 93 | Ht 73.0 in | Wt 198.5 lb

## 2017-11-12 DIAGNOSIS — I2510 Atherosclerotic heart disease of native coronary artery without angina pectoris: Secondary | ICD-10-CM | POA: Diagnosis not present

## 2017-11-12 DIAGNOSIS — I110 Hypertensive heart disease with heart failure: Secondary | ICD-10-CM

## 2017-11-12 DIAGNOSIS — R0609 Other forms of dyspnea: Secondary | ICD-10-CM

## 2017-11-12 DIAGNOSIS — I10 Essential (primary) hypertension: Secondary | ICD-10-CM | POA: Diagnosis not present

## 2017-11-12 DIAGNOSIS — I5022 Chronic systolic (congestive) heart failure: Secondary | ICD-10-CM

## 2017-11-12 DIAGNOSIS — F1910 Other psychoactive substance abuse, uncomplicated: Secondary | ICD-10-CM

## 2017-11-12 MED ORDER — CARVEDILOL 6.25 MG PO TABS: 6.2500 mg | ORAL_TABLET | Freq: Two times a day (BID) | ORAL | 3 refills | Status: DC

## 2017-11-12 MED ORDER — FUROSEMIDE 20 MG PO TABS: 20.0000 mg | ORAL_TABLET | Freq: Every day | ORAL | 6 refills | Status: DC | PRN

## 2017-11-12 MED ORDER — LISINOPRIL 20 MG PO TABS: 20.0000 mg | ORAL_TABLET | Freq: Every day | ORAL | 6 refills | Status: DC

## 2017-11-12 NOTE — Patient Instructions (Signed)
Your physician has recommended you make the following change in your medication:  START CARVEDILOL 6.26 MG TWICE DAILY FUROSEMIDE 20 MG AS NEEDED FOR SWELLING  Your physician recommends that you schedule a follow-up appointment in: 1 MONTHS WITH DR END [facility]

## 2017-11-12 NOTE — Progress Notes (Signed)
Cardiology Office Note    Date:  11/12/2017   ID:  Matthew Riley, DOB [birth year], MRN [id number]  PCP:  Charlott Rakes, MD  Cardiologist:  New to [facility]  Chief Complaint: establish cardiac care for CAD   History of Present Illness:   Matthew Riley is a 42 y.o. male with hx of CAD, uncontrolled type I DM, CVA with lower extremity weakness, HTN, hep B and gastroparesis referred by Dr. Margarita Rana to establish cardiac care.   Reviewed recent lab work by PCP.   He was admitted to [facility], [place], [place] either in 2017 or 2018 with diabetic coma.  He had stenting to his heart per records.  Patient is unable to provide detailed information.  Patient states that he never took any Plavix, Brilinta or Effient.  He takes aspirin 81 mg daily.  Patient had a stroke few months letter and moved to [place] with her sister summer 2018.  He had multiple admission with diabetic ketoacidosis to local hospital since then.  Here today to establish cardiac care.  Patient denies any exertional chest pain however he has dyspnea on exertion.  This is stable.  He has bilateral lower extremity weakness because of stroke.  He uses walker.  Has intermittent lower extremity edema and try to watch his salt intake.  Denies orthopnea, PND, syncope, dizziness, chest pains, melena or blood in his stool or urine.  Past Medical History:  Diagnosis Date  . Coronary artery disease   . Diabetes mellitus without complication ([place])   . DKA (diabetic ketoacidoses) ([place]) 08/2017  . GERD (gastroesophageal reflux disease)   . Hepatitis B   . Hypertension   . Renal disorder   . Rheumatoid arthritis ([place])   . Stroke [facility]) 02/2017    Past Surgical History:  Procedure Laterality Date  . CORONARY ANGIOPLASTY WITH STENT PLACEMENT      Current Medications: Prior to Admission medications   Medication Sig Start Date End Date Taking? Authorizing Provider  ACCU-CHEK SOFTCLIX LANCETS lancets Use as instructed  09/18/17   Charlott Rakes, MD  aspirin EC 81 MG tablet Take 1 tablet (81 mg total) by mouth daily. 06/25/17   Alfonse Spruce, FNP  atorvastatin (LIPITOR) 40 MG tablet Take 1 tablet (40 mg total) by mouth daily at 6 PM. 10/23/17   Charlott Rakes, MD  Blood Glucose Monitoring Suppl (ACCU-CHEK AVIVA PLUS) w/Device KIT Check blood sugar 3 times daily. 09/18/17   Charlott Rakes, MD  cyclobenzaprine (FLEXERIL) 5 MG tablet Take 1 tablet (5 mg total) by mouth 3 (three) times daily as needed for muscle spasms. 10/23/17   Charlott Rakes, MD  ergocalciferol (DRISDOL) 50000 units capsule Take 1 capsule (50,000 Units total) by mouth once a week. 10/24/17   Charlott Rakes, MD  gabapentin (NEURONTIN) 300 MG capsule Take 1 capsule (300 mg total) by mouth 3 (three) times daily. 10/23/17   Charlott Rakes, MD  glucose blood (ACCU-CHEK AVIVA PLUS) test strip Use as instructed 09/18/17   Charlott Rakes, MD  glucose blood (TRUE METRIX BLOOD GLUCOSE TEST) test strip 1 each by Other route 3 (three) times daily. 09/17/17   Charlott Rakes, MD  insulin aspart (NOVOLOG) 100 UNIT/ML injection 0 to 12 units 3 times daily before meals as per sliding scale 10/23/17   Charlott Rakes, MD  insulin detemir (LEVEMIR) 100 UNIT/ML injection Inject 0.29 mLs (29 Units total) into the skin at bedtime. 10/23/17   Charlott Rakes, MD  lisinopril (PRINIVIL,ZESTRIL) 20 MG tablet Take  1 tablet (20 mg total) by mouth daily. 10/23/17   Charlott Rakes, MD  metoCLOPramide (REGLAN) 5 MG tablet Take 1 tablet (5 mg total) by mouth every 8 (eight) hours as needed for nausea. 09/17/17   Charlott Rakes, MD  pantoprazole (PROTONIX) 40 MG tablet Take 1 tablet (40 mg total) by mouth daily. 10/23/17   Charlott Rakes, MD  polyethylene glycol (MIRALAX / GLYCOLAX) packet Take 17 g by mouth 2 (two) times daily. 06/21/17   Love, Ivan Anchors, PA-C  silver sulfADIAZINE (SILVADENE) 1 % cream Apply 1 application topically daily. 07/22/17   Charlott Rakes, MD  tenofovir  (VIREAD) 300 MG tablet Take 1 tablet (300 mg total) by mouth daily. 10/15/17   [place], FNP  thiamine 100 MG tablet Take 1 tablet (100 mg total) by mouth daily. 06/13/17   Regalado, Belkys A, MD  traMADol (ULTRAM) 50 MG tablet Take 1 tablet (50 mg total) by mouth every 12 (twelve) hours as needed for severe pain. 07/18/17   Alfonse Spruce, FNP    Allergies:   Patient has no known allergies.   Social History   Socioeconomic History  . Marital status: Single    Spouse name: Not on file  . Number of children: Not on file  . Years of education: Not on file  . Highest education level: Not on file  Occupational History  . Not on file  Social Needs  . Financial resource strain: Not on file  . Food insecurity:    Worry: Not on file    Inability: Not on file  . Transportation needs:    Medical: Not on file    Non-medical: Not on file  Tobacco Use  . Smoking status: Former Research scientist (life sciences)  . Smokeless tobacco: Never Used  . Tobacco comment: reports smoking for many years, quit years ago  Substance and Sexual Activity  . Alcohol use: No    Frequency: Never    Comment: former use, last drink early 2018  . Drug use: Yes    Types: Marijuana    Comment: patient denies  . Sexual activity: Not on file  Lifestyle  . Physical activity:    Days per week: Not on file    Minutes per session: Not on file  . Stress: Not on file  Relationships  . Social connections:    Talks on phone: Not on file    Gets together: Not on file    Attends religious service: Not on file    Active member of club or organization: Not on file    Attends meetings of clubs or organizations: Not on file    Relationship status: Not on file  Other Topics Concern  . Not on file  Social History Narrative  . Not on file     Family History:  The patient's family history includes Diabetes in his father and mother.   ROS:   Please see the history of present illness.    ROS All other systems reviewed and are  negative.   PHYSICAL EXAM:   VS:  BP (!) 148/102   Pulse 93   Ht _0  (1.854 m)   Wt 198 lb 8 oz (90 kg)   SpO2 99%   BMI 26.19 kg/m    GEN: Well nourished, well developed, in no acute distress  HEENT: normal  Neck: no JVD, carotid bruits, or masses Cardiac: RRR; no murmurs, rubs, or gallops,no edema  Respiratory:  clear to auscultation bilaterally, normal work of breathing  GI: soft, nontender, nondistended, + BS MS: no deformity or atrophy  Skin: warm and dry, no rash Neuro:  Alert and Oriented x 3, lower extremity weakness Psych: euthymic mood, full affect  Wt Readings from Last 3 Encounters:  11/12/17 198 lb 8 oz (90 kg)  10/23/17 135 lb 12.8 oz (61.6 kg)  10/15/17 196 lb (88.9 kg)      Studies/Labs Reviewed:   EKG:  EKG is not  ordered today.  The ekg ordered  08/31/2017 demonstrates sinus rhythm at rate of 92 bpm  Recent Labs: 06/05/2017: B Natriuretic Peptide 132.9 09/03/2017: ALT 235 09/04/2017: BUN 17; Creatinine, Ser 1.08; Magnesium 1.8; Potassium 4.2; Sodium 128 10/23/2017: Hemoglobin 12.4; Platelets 206; TSH 1.500   Lipid Panel No results found for: CHOL, TRIG, HDL, CHOLHDL, VLDL, LDLCALC, LDLDIRECT  Additional studies/ records that were reviewed today include:   Echocardiogram: 09/01/2017 Study Conclusions  - Left ventricle: The cavity size was normal. Wall thickness was   increased in a pattern of mild LVH. Systolic function was mildly   to moderately reduced. The estimated ejection fraction was in the   range of 40% to 45%. Left ventricular diastolic function   parameters were normal.    ASSESSMENT & PLAN:    1. Reported history of CAD with stenting -Will request record.  No chest pain.  Patient denies taking dual antiplatelet therapy in the past.  Continue aspirin 81 mg and statin.  2. Chronic systolic heart failure -LV function of 40 to 45% by echocardiogram 08/2017.  Patient is euvolemic by exam today.  He has intermittent lower extremity edema  likely due to excess salt intake.  Encouraged to limit salt.  Will give PRN Lasix. - Continue lisinopril and add Coreg.  3.  Hypertension  - Elevated today. -Patient states that he ran out of lisinopril 20 mg 2 days ago.  Will refill the prescription and add Coreg 6.25 mg twice daily.  4.  Polysubstance abuse -He smokes cigarette and marijuana occasionally.  He goes without smoking 4 weeks.  Denies cocaine abuse.  Encourage complete cessation.  Education given.  5.  Dyspnea on exertion -Likely due to deconditioning.  He has no symptoms concerning for angina.  No ischemic evaluation warranted at this time.  6.  CVA with residual lower extremity weakness -He has applied for disability.  Reviewed with [facility] Dr. Lovena Le. Plan to follow up with general cardiology.   Medication Adjustments/Labs and Tests Ordered: Current medicines are reviewed at length with the patient today.  Concerns regarding medicines are outlined above.  Medication changes, Labs and Tests ordered today are listed in the Patient Instructions below. Patient Instructions  Your physician has recommended you make the following change in your medication:  START CARVEDILOL 6.26 MG TWICE DAILY FUROSEMIDE 20 MG AS NEEDED FOR SWELLING  Your physician recommends that you schedule a follow-up appointment in: [place] DR END [facility]    Jarrett Soho, PA  11/12/2017 2:47 PM    [facility] [address], [place], [place]  [address] Phone: [phone]; Fax: [phone]

## 2017-11-20 MED FILL — PANTOPRAZOLE SOD DR 40 MG T: 40 | 30 days supply | Qty: 30 | Fill #1

## 2017-11-20 MED FILL — VIT D2 1.25 MG (50,000 UNIT: 1.25 MG | 28 days supply | Qty: 4 | Fill #1

## 2017-11-20 MED FILL — TENOFOVIR DISOPROXIL FUMARA: 300 | 30 days supply | Qty: 30 | Fill #1

## 2017-11-20 MED FILL — LISINOPRIL 20 MG TAB: 20 | 30 days supply | Qty: 30 | Fill #0

## 2017-11-21 MED FILL — ACCU-CHEK AVIVA PLUS TEST S: 30 days supply | Qty: 100 | Fill #1

## 2017-12-03 MED FILL — GABAPENTIN 300 MG CAPSULE: 300 | 30 days supply | Qty: 90 | Fill #1

## 2017-12-10 ENCOUNTER — Telehealth: Payer: Self-pay | Admitting: Physician Assistant

## 2017-12-10 ENCOUNTER — Ambulatory Visit: Payer: Medicaid Other | Admitting: Infectious Disease

## 2017-12-10 NOTE — Telephone Encounter (Signed)
Release received back from [facility] stating they have no records on patient. I have scanned release in Epic.

## 2017-12-20 MED FILL — VIT D2 1.25 MG (50,000 UNIT: 1.25 MG | 28 days supply | Qty: 4 | Fill #2

## 2017-12-20 MED FILL — CARVEDILOL 6.25 MG TABLET: 6.25 | 30 days supply | Qty: 60 | Fill #0

## 2017-12-20 MED FILL — FUROSEMIDE 20 MG TABLET: 20 | 30 days supply | Qty: 30 | Fill #0

## 2017-12-20 MED FILL — METOCLOPRAMIDE 5 MG TABLET: 5 | 30 days supply | Qty: 90 | Fill #0

## 2017-12-20 MED FILL — LISINOPRIL 20 MG TAB: 20 | 30 days supply | Qty: 30 | Fill #1

## 2017-12-20 MED FILL — ATORVASTATIN CALCIUM 40 MG: 40 | 30 days supply | Qty: 30 | Fill #1

## 2017-12-20 MED FILL — PANTOPRAZOLE SOD DR 40 MG T: 40 | 30 days supply | Qty: 30 | Fill #2

## 2017-12-22 NOTE — Progress Notes (Deleted)
   Follow-up Outpatient Visit Date: 12/23/2017  Primary Care Provider: Hoy Register, MD [address] [place] [address]  Chief Complaint: ***  HPI:  Matthew Riley is a 42 y.o. year-old male with history of coronary artery disease, ischemic cardiomyopathy with LVEF of 40 to 45% by echo in 08/2017, uncontrolled type 1 diabetes mellitus, prior stroke, hypertension, hepatitis B, and gastroparesis, who presents for follow-up of coronary artery disease.  He was seen as a new patient in our office last month by Chelsea Aus, PA.  He reported stable exertional dyspnea and long-standing leg weakness due to his prior stroke.  At that time, blood pressures were noted to be elevated, prompting addition of carvedilol 6.25 mg twice daily.  --------------------------------------------------------------------------------------------------  Past Medical History:  Diagnosis Date  . Coronary artery disease   . Diabetes mellitus without complication (HCC)   . DKA (diabetic ketoacidoses) (HCC) 08/2017  . GERD (gastroesophageal reflux disease)   . Hepatitis B   . Hypertension   . Renal disorder   . Rheumatoid arthritis (HCC)   . Stroke [facility]) 02/2017   Past Surgical History:  Procedure Laterality Date  . CORONARY ANGIOPLASTY WITH STENT PLACEMENT      No outpatient medications have been marked as taking for the 12/23/17 encounter (Appointment) with Matthew Riley, Matthew Deer, MD.    Allergies: Patient has no known allergies.  Social History   Tobacco Use  . Smoking status: Former Games developer  . Smokeless tobacco: Never Used  . Tobacco comment: reports smoking for many years, quit years ago  Substance Use Topics  . Alcohol use: No    Frequency: Never    Comment: former use, last drink early 2018  . Drug use: Yes    Types: Marijuana    Comment: patient denies    Family History  Problem Relation Age of Onset  . Diabetes Mother   . Diabetes Father     Review of Systems: A 12-system review of  systems was performed and was negative except as noted in the HPI.  --------------------------------------------------------------------------------------------------  Physical Exam: There were no vitals taken for this visit.  General:  *** HEENT: No conjunctival pallor or scleral icterus. Moist mucous membranes.  OP clear. Neck: Supple without lymphadenopathy, thyromegaly, JVD, or HJR. No carotid bruit. Lungs: Normal work of breathing. Clear to auscultation bilaterally without wheezes or crackles. Heart: Regular rate and rhythm without murmurs, rubs, or gallops. Non-displaced PMI. Abd: Bowel sounds present. Soft, NT/ND without hepatosplenomegaly Ext: No lower extremity edema. Radial, PT, and DP pulses are 2+ bilaterally. Skin: Warm and dry without rash.  EKG:  ***  Lab Results  Component Value Date   WBC 8.2 10/23/2017   HGB 12.4 (L) 10/23/2017   HCT 35.3 (L) 10/23/2017   MCV 87 10/23/2017   PLT 206 10/23/2017    Lab Results  Component Value Date   NA 128 (L) 09/04/2017   K 4.2 09/04/2017   CL 95 (L) 09/04/2017   CO2 26 09/04/2017   BUN 17 09/04/2017   CREATININE 1.08 09/04/2017   GLUCOSE 155 (H) 09/04/2017   ALT 235 (H) 09/03/2017    No results found for: CHOL, HDL, LDLCALC, LDLDIRECT, TRIG, CHOLHDL  --------------------------------------------------------------------------------------------------  ASSESSMENT AND PLAN: ***  Yvonne Kendall, MD 12/22/2017 3:55 PM

## 2017-12-23 ENCOUNTER — Ambulatory Visit: Payer: Medicaid Other | Admitting: Internal Medicine

## 2017-12-26 MED FILL — TENOFOVIR DISOPROXIL FUMARA: 300 | 30 days supply | Qty: 30 | Fill #2

## 2017-12-30 ENCOUNTER — Encounter: Payer: Self-pay | Admitting: Family Medicine

## 2017-12-30 ENCOUNTER — Ambulatory Visit: Payer: Medicaid Other | Attending: Family Medicine | Admitting: Family Medicine

## 2017-12-30 VITALS — BP 131/90 | HR 109 | Temp 98.2°F | Ht 73.0 in | Wt 189.4 lb

## 2017-12-30 DIAGNOSIS — M5441 Lumbago with sciatica, right side: Secondary | ICD-10-CM | POA: Insufficient documentation

## 2017-12-30 DIAGNOSIS — I2510 Atherosclerotic heart disease of native coronary artery without angina pectoris: Secondary | ICD-10-CM | POA: Diagnosis not present

## 2017-12-30 DIAGNOSIS — E1065 Type 1 diabetes mellitus with hyperglycemia: Secondary | ICD-10-CM | POA: Insufficient documentation

## 2017-12-30 DIAGNOSIS — I10 Essential (primary) hypertension: Secondary | ICD-10-CM | POA: Diagnosis not present

## 2017-12-30 DIAGNOSIS — G8929 Other chronic pain: Secondary | ICD-10-CM

## 2017-12-30 DIAGNOSIS — Z79899 Other long term (current) drug therapy: Secondary | ICD-10-CM | POA: Insufficient documentation

## 2017-12-30 DIAGNOSIS — Z720 Tobacco use: Secondary | ICD-10-CM | POA: Diagnosis not present

## 2017-12-30 DIAGNOSIS — Z7982 Long term (current) use of aspirin: Secondary | ICD-10-CM | POA: Diagnosis not present

## 2017-12-30 DIAGNOSIS — Z8673 Personal history of transient ischemic attack (TIA), and cerebral infarction without residual deficits: Secondary | ICD-10-CM

## 2017-12-30 DIAGNOSIS — E1042 Type 1 diabetes mellitus with diabetic polyneuropathy: Secondary | ICD-10-CM | POA: Insufficient documentation

## 2017-12-30 DIAGNOSIS — Z794 Long term (current) use of insulin: Secondary | ICD-10-CM | POA: Insufficient documentation

## 2017-12-30 DIAGNOSIS — M5442 Lumbago with sciatica, left side: Secondary | ICD-10-CM | POA: Diagnosis not present

## 2017-12-30 LAB — POCT GLYCOSYLATED HEMOGLOBIN (HGB A1C): Hemoglobin A1C: 11.6 % — AB (ref 4.0–5.6)

## 2017-12-30 LAB — GLUCOSE, POCT (MANUAL RESULT ENTRY): POC GLUCOSE: 312 mg/dL — AB (ref 70–99)

## 2017-12-30 MED ORDER — NICOTINE 14 MG/24HR TD PT24: 14.0000 mg | MEDICATED_PATCH | Freq: Every day | TRANSDERMAL | 1 refills | Status: AC

## 2017-12-30 MED ORDER — GABAPENTIN 300 MG PO CAPS: 600.0000 mg | ORAL_CAPSULE | Freq: Two times a day (BID) | ORAL | 6 refills | Status: AC

## 2017-12-30 MED ORDER — CYCLOBENZAPRINE HCL 10 MG PO TABS: 10.0000 mg | ORAL_TABLET | Freq: Two times a day (BID) | ORAL | 3 refills | Status: AC | PRN

## 2017-12-30 MED ORDER — INSULIN DETEMIR 100 UNIT/ML ~~LOC~~ SOLN: 40.0000 [IU] | Freq: Every day | SUBCUTANEOUS | 6 refills | Status: AC

## 2017-12-30 MED FILL — LEVEMIR 100 UNITS/ML VIAL: 100 | 30 days supply | Qty: 30 | Fill #0

## 2017-12-30 MED FILL — GABAPENTIN 300 MG CAPSULE: 300 | 30 days supply | Qty: 120 | Fill #0

## 2017-12-30 MED FILL — NICOTINE 14 MG/24HR PATCH: 14 | 28 days supply | Qty: 28 | Fill #0

## 2017-12-30 NOTE — Progress Notes (Signed)
 Subjective:  Patient ID: Matthew Riley, male    DOB: 06/20/1975  Age: 42 y.o. MRN: [id number]  CC: Diabetes   HPI Matthew Riley  is a 42-year-old male with a history of type 2 diabetes mellitus (A1c of 11.6), diabetic ulcers, hypertension, CVA with lower extremity weakness, CAD (status post stent), GERD who presents today for follow-up visit. His A1c is 11.1 and has trended up from 10.3 previously and he has been taking 29 units of Levemir at bedtime along with his NovoLog sliding scale.  He denies hypoglycemia but does have diabetic neuropathy which is uncontrolled on gabapentin.  Blood sugars at home have been in the 300-400 range. He complains of low back pain across his lumbar region which has worsened over the last 2 days.  Is he does have a history of chronic bilateral low back pain which usually radiates down both posterior thighs but this back pain he states is different from his chronic back pain.  He denies recent falls. He has been undergoing a lot of stress especially with his waiting on approval for disability and so resumed smoking again. Denies chest pains or shortness of breath.  Past Medical History:  Diagnosis Date  . Coronary artery disease   . Diabetes mellitus without complication (HCC)   . DKA (diabetic ketoacidoses) (HCC) 08/2017  . GERD (gastroesophageal reflux disease)   . Hepatitis B   . Hypertension   . Renal disorder   . Rheumatoid arthritis (HCC)   . Stroke (HCC) 02/2017    Past Surgical History:  Procedure Laterality Date  . CORONARY ANGIOPLASTY WITH STENT PLACEMENT      No Known Allergies   Outpatient Medications Prior to Visit  Medication Sig Dispense Refill  . ACCU-CHEK SOFTCLIX LANCETS lancets Use as instructed 100 each 12  . aspirin EC 81 MG tablet Take 1 tablet (81 mg total) by mouth daily. 30 tablet 11  . atorvastatin (LIPITOR) 40 MG tablet Take 1 tablet (40 mg total) by mouth daily at 6 PM. 30 tablet 6  . Blood Glucose Monitoring Suppl  (ACCU-CHEK AVIVA PLUS) w/Device KIT Check blood sugar 3 times daily. 1 kit 0  . glucose blood (ACCU-CHEK AVIVA PLUS) test strip Use as instructed 100 each 12  . glucose blood (TRUE METRIX BLOOD GLUCOSE TEST) test strip 1 each by Other route 3 (three) times daily. 100 each 12  . insulin aspart (NOVOLOG) 100 UNIT/ML injection 0 to 12 units 3 times daily before meals as per sliding scale 30 mL 6  . lisinopril (PRINIVIL,ZESTRIL) 20 MG tablet Take 1 tablet (20 mg total) by mouth daily. 30 tablet 6  . metoCLOPramide (REGLAN) 5 MG tablet Take 1 tablet (5 mg total) by mouth every 8 (eight) hours as needed for nausea. 90 tablet 3  . pantoprazole (PROTONIX) 40 MG tablet Take 1 tablet (40 mg total) by mouth daily. 30 tablet 6  . polyethylene glycol (MIRALAX / GLYCOLAX) packet Take 17 g by mouth 2 (two) times daily. 14 each 0  . silver sulfADIAZINE (SILVADENE) 1 % cream Apply 1 application topically daily. 50 g 0  . tenofovir (VIREAD) 300 MG tablet Take 1 tablet (300 mg total) by mouth daily. 30 tablet 6  . thiamine 100 MG tablet Take 1 tablet (100 mg total) by mouth daily. 30 tablet 0  . traMADol (ULTRAM) 50 MG tablet Take 1 tablet (50 mg total) by mouth every 12 (twelve) hours as needed for severe pain. 40 tablet 0  . cyclobenzaprine (FLEXERIL)   5 MG tablet Take 1 tablet (5 mg total) by mouth 3 (three) times daily as needed for muscle spasms. 90 tablet 2  . gabapentin (NEURONTIN) 300 MG capsule Take 1 capsule (300 mg total) by mouth 3 (three) times daily. 90 capsule 3  . insulin detemir (LEVEMIR) 100 UNIT/ML injection Inject 0.29 mLs (29 Units total) into the skin at bedtime. 30 mL 6  . carvedilol (COREG) 6.25 MG tablet Take 1 tablet (6.25 mg total) by mouth 2 (two) times daily. (Patient not taking: Reported on 12/30/2017) 180 tablet 3  . ergocalciferol (DRISDOL) 50000 units capsule Take 1 capsule (50,000 Units total) by mouth once a week. (Patient not taking: Reported on 12/30/2017) 9 capsule 1  . furosemide  (LASIX) 20 MG tablet Take 1 tablet (20 mg total) by mouth daily as needed. (Patient not taking: Reported on 12/30/2017) 30 tablet 6   No facility-administered medications prior to visit.     ROS Review of Systems  Constitutional: Negative for activity change and appetite change.  HENT: Negative for sinus pressure and sore throat.   Eyes: Negative for visual disturbance.  Respiratory: Negative for cough, chest tightness and shortness of breath.   Cardiovascular: Negative for chest pain and leg swelling.  Gastrointestinal: Negative for abdominal distention, abdominal pain, constipation and diarrhea.  Endocrine: Negative.   Genitourinary: Negative for dysuria.  Musculoskeletal: Positive for back pain. Negative for joint swelling and myalgias.  Skin: Negative for rash.  Allergic/Immunologic: Negative.   Neurological: Positive for numbness. Negative for weakness and light-headedness.  Psychiatric/Behavioral: Negative for dysphoric mood and suicidal ideas.     Objective:  BP 131/90   Pulse (!) 109   Temp 98.2 F (36.8 C) (Oral)   Ht 6' 1" (1.854 m)   Wt 189 lb 6.4 oz (85.9 kg)   SpO2 97%   BMI 24.99 kg/m   BP/Weight 12/30/2017 11/12/2017 10/23/2017  Systolic BP 131 148 152  Diastolic BP 90 102 106  Wt. (Lbs) 189.4 198.5 135.8  BMI 24.99 26.19 17.92      Physical Exam  Constitutional: He is oriented to person, place, and time. He appears well-developed and well-nourished.  Cardiovascular: Normal rate, normal heart sounds and intact distal pulses.  No murmur heard. Pulmonary/Chest: Effort normal and breath sounds normal. He has no wheezes. He has no rales. He exhibits no tenderness.  Abdominal: Soft. Bowel sounds are normal. He exhibits no distension and no mass. There is no tenderness.  Musculoskeletal: He exhibits tenderness (slight TTP across lumbar spine).  Neurological: He is alert and oriented to person, place, and time.  Skin: Skin is warm and dry.  Psychiatric: He has a  normal mood and affect.     CMP Latest Ref Rng & Units 09/04/2017 09/03/2017 09/02/2017  Glucose 65 - 99 mg/dL 155(H) 269(H) 300(H)  BUN 6 - 20 mg/dL 17 23(H) 22(H)  Creatinine 0.61 - 1.24 mg/dL 1.08 1.00 1.09  Sodium 135 - 145 mmol/L 128(L) 127(L) 131(L)  Potassium 3.5 - 5.1 mmol/L 4.2 3.9 4.4  Chloride 101 - 111 mmol/L 95(L) 94(L) 98(L)  CO2 22 - 32 mmol/L 26 26 28  Calcium 8.9 - 10.3 mg/dL 8.0(L) 8.0(L) 8.3(L)  Total Protein 6.5 - 8.1 g/dL - 7.7 -  Total Bilirubin 0.3 - 1.2 mg/dL - 1.1 -  Alkaline Phos 38 - 126 U/L - 142(H) -  AST 15 - 41 U/L - 377(H) -  ALT 17 - 63 U/L - 235(H) -     Lab Results    Component Value Date   HGBA1C 11.6 (A) 12/30/2017    Assessment & Plan:   1. Uncontrolled type 1 diabetes mellitus with hyperglycemia (HCC) Uncontrolled with A1c of 11.6 which has trended up from 10.3 previously Increased dose of Levemir Continue NovoLog sliding scale Counseled on Diabetic diet, my plate method, 150 minutes of moderate intensity exercise/week Keep blood sugar logs with fasting goals of 80-120 mg/dl, random of less than 180 and in the event of sugars less than 60 mg/dl or greater than 400 mg/dl please notify the clinic ASAP. It is recommended that you undergo annual eye exams and annual foot exams. Pneumonia vaccine is recommended. - POCT glucose (manual entry) - POCT glycosylated hemoglobin (Hb A1C) - insulin detemir (LEVEMIR) 100 UNIT/ML injection; Inject 0.4 mLs (40 Units total) into the skin at bedtime.  Dispense: 30 mL; Refill: 6 - Lipid panel - CMP14+EGFR - Microalbumin/Creatinine Ratio, Urine  2. Diabetic peripheral neuropathy associated with type 1 diabetes mellitus (HCC) Uncontrolled Increased dose of gabapentin - gabapentin (NEURONTIN) 300 MG capsule; Take 2 capsules (600 mg total) by mouth 2 (two) times daily.  Dispense: 120 capsule; Refill: 6  3. History of stroke High risk for falls Ambulate with the aid of a walker - cyclobenzaprine (FLEXERIL) 10  MG tablet; Take 1 tablet (10 mg total) by mouth 2 (two) times daily as needed for muscle spasms.  Dispense: 60 tablet; Refill: 3  4. Chronic bilateral low back pain with bilateral sciatica Uncontrolled Increase gabapentin and Flexeril doses He could also have residual symptoms from his stroke  5. Tobacco abuse Recently relapsed Spent 3 minutes discussing cessation and he is willing to work on quitting - nicotine (NICODERM CQ) 14 mg/24hr patch; Place 1 patch (14 mg total) onto the skin daily. For 1 month then 7 mg / 24-hour patch for the next month  Dispense: 28 patch; Refill: 1   Meds ordered this encounter  Medications  . insulin detemir (LEVEMIR) 100 UNIT/ML injection    Sig: Inject 0.4 mLs (40 Units total) into the skin at bedtime.    Dispense:  30 mL    Refill:  6    Discontinue previous dose  . nicotine (NICODERM CQ) 14 mg/24hr patch    Sig: Place 1 patch (14 mg total) onto the skin daily. For 1 month then 7 mg / 24-hour patch for the next month    Dispense:  28 patch    Refill:  1  . gabapentin (NEURONTIN) 300 MG capsule    Sig: Take 2 capsules (600 mg total) by mouth 2 (two) times daily.    Dispense:  120 capsule    Refill:  6    Discontinue previous dose  . cyclobenzaprine (FLEXERIL) 10 MG tablet    Sig: Take 1 tablet (10 mg total) by mouth 2 (two) times daily as needed for muscle spasms.    Dispense:  60 tablet    Refill:  3    Discontinue previous dose    Follow-up: Return in about 3 months (around 04/01/2018) for follow up on diabetes mellitus.     MD   

## 2017-12-30 NOTE — Patient Instructions (Signed)
Diabetes Mellitus and Nutrition When you have diabetes (diabetes mellitus), it is very important to have healthy eating habits because your blood sugar (glucose) levels are greatly affected by what you eat and drink. Eating healthy foods in the appropriate amounts, at about the same times every day, can help you:  Control your blood glucose.  Lower your risk of heart disease.  Improve your blood pressure.  Reach or maintain a healthy weight.  Every person with diabetes is different, and each person has different needs for a meal plan. Your health care provider may recommend that you work with a diet and nutrition specialist (dietitian) to make a meal plan that is best for you. Your meal plan may vary depending on factors such as:  The calories you need.  The medicines you take.  Your weight.  Your blood glucose, blood pressure, and cholesterol levels.  Your activity level.  Other health conditions you have, such as heart or kidney disease.  How do carbohydrates affect me? Carbohydrates affect your blood glucose level more than any other type of food. Eating carbohydrates naturally increases the amount of glucose in your blood. Carbohydrate counting is a method for keeping track of how many carbohydrates you eat. Counting carbohydrates is important to keep your blood glucose at a healthy level, especially if you use insulin or take certain oral diabetes medicines. It is important to know how many carbohydrates you can safely have in each meal. This is different for every person. Your dietitian can help you calculate how many carbohydrates you should have at each meal and for snack. Foods that contain carbohydrates include:  Bread, cereal, rice, pasta, and crackers.  Potatoes and corn.  Peas, beans, and lentils.  Milk and yogurt.  Fruit and juice.  Desserts, such as cakes, cookies, ice cream, and candy.  How does alcohol affect me? Alcohol can cause a sudden decrease in blood  glucose (hypoglycemia), especially if you use insulin or take certain oral diabetes medicines. Hypoglycemia can be a life-threatening condition. Symptoms of hypoglycemia (sleepiness, dizziness, and confusion) are similar to symptoms of having too much alcohol. If your health care provider says that alcohol is safe for you, follow these guidelines:  Limit alcohol intake to no more than 1 drink per day for nonpregnant women and 2 drinks per day for men. One drink equals 12 oz of beer, 5 oz of wine, or 1 oz of hard liquor.  Do not drink on an empty stomach.  Keep yourself hydrated with water, diet soda, or unsweetened iced tea.  Keep in mind that regular soda, juice, and other mixers may contain a lot of sugar and must be counted as carbohydrates.  What are tips for following this plan? Reading food labels  Start by checking the serving size on the label. The amount of calories, carbohydrates, fats, and other nutrients listed on the label are based on one serving of the food. Many foods contain more than one serving per package.  Check the total grams (g) of carbohydrates in one serving. You can calculate the number of servings of carbohydrates in one serving by dividing the total carbohydrates by 15. For example, if a food has 30 g of total carbohydrates, it would be equal to 2 servings of carbohydrates.  Check the number of grams (g) of saturated and trans fats in one serving. Choose foods that have low or no amount of these fats.  Check the number of milligrams (mg) of sodium in one serving. Most people   should limit total sodium intake to less than 2,300 mg per day.  Always check the nutrition information of foods labeled as "low-fat" or "nonfat". These foods may be higher in added sugar or refined carbohydrates and should be avoided.  Talk to your dietitian to identify your daily goals for nutrients listed on the label. Shopping  Avoid buying canned, premade, or processed foods. These  foods tend to be high in fat, sodium, and added sugar.  Shop around the outside edge of the grocery store. This includes fresh fruits and vegetables, bulk grains, fresh meats, and fresh dairy. Cooking  Use low-heat cooking methods, such as baking, instead of high-heat cooking methods like deep frying.  Cook using healthy oils, such as olive, canola, or sunflower oil.  Avoid cooking with butter, cream, or high-fat meats. Meal planning  Eat meals and snacks regularly, preferably at the same times every day. Avoid going long periods of time without eating.  Eat foods high in fiber, such as fresh fruits, vegetables, beans, and whole grains. Talk to your dietitian about how many servings of carbohydrates you can eat at each meal.  Eat 4-6 ounces of lean protein each day, such as lean meat, chicken, fish, eggs, or tofu. 1 ounce is equal to 1 ounce of meat, chicken, or fish, 1 egg, or 1/4 cup of tofu.  Eat some foods each day that contain healthy fats, such as avocado, nuts, seeds, and fish. Lifestyle   Check your blood glucose regularly.  Exercise at least 30 minutes 5 or more days each week, or as told by your health care provider.  Take medicines as told by your health care provider.  Do not use any products that contain nicotine or tobacco, such as cigarettes and e-cigarettes. If you need help quitting, ask your health care provider.  Work with a counselor or diabetes educator to identify strategies to manage stress and any emotional and social challenges. What are some questions to ask my health care provider?  Do I need to meet with a diabetes educator?  Do I need to meet with a dietitian?  What number can I call if I have questions?  When are the best times to check my blood glucose? Where to find more information:  American Diabetes Association: diabetes.org/food-and-fitness/food  Academy of Nutrition and Dietetics:  www.eatright.org/resources/health/diseases-and-conditions/diabetes  National Institute of Diabetes and Digestive and Kidney Diseases (NIH): www.niddk.nih.gov/health-information/diabetes/overview/diet-eating-physical-activity Summary  A healthy meal plan will help you control your blood glucose and maintain a healthy lifestyle.  Working with a diet and nutrition specialist (dietitian) can help you make a meal plan that is best for you.  Keep in mind that carbohydrates and alcohol have immediate effects on your blood glucose levels. It is important to count carbohydrates and to use alcohol carefully. This information is not intended to replace advice given to you by your health care provider. Make sure you discuss any questions you have with your health care provider. Document Released: 02/15/2005 Document Revised: 06/25/2016 Document Reviewed: 06/25/2016 Elsevier Interactive Patient Education  2018 [employer] Inc.  

## 2017-12-31 ENCOUNTER — Encounter: Payer: Self-pay | Admitting: Family Medicine

## 2017-12-31 LAB — LIPID PANEL
CHOLESTEROL TOTAL: 158 mg/dL (ref 100–199)
Chol/HDL Ratio: 3.7 ratio (ref 0.0–5.0)
HDL: 43 mg/dL (ref >39)
LDL Calculated: 97 mg/dL (ref 0–99)
TRIGLYCERIDES: 89 mg/dL (ref 0–149)
VLDL Cholesterol Cal: 18 mg/dL (ref 5–40)

## 2017-12-31 LAB — CMP14+EGFR
ALBUMIN: 3.4 g/dL — AB (ref 3.5–5.5)
ALT: 64 IU/L — ABNORMAL HIGH (ref 0–44)
AST: 91 IU/L — ABNORMAL HIGH (ref 0–40)
Albumin/Globulin Ratio: 0.7 — ABNORMAL LOW (ref 1.2–2.2)
Alkaline Phosphatase: 130 IU/L — ABNORMAL HIGH (ref 39–117)
BUN / CREAT RATIO: 19 (ref 9–20)
BUN: 25 mg/dL — ABNORMAL HIGH (ref 6–24)
Bilirubin Total: 0.4 mg/dL (ref 0.0–1.2)
CALCIUM: 9.3 mg/dL (ref 8.7–10.2)
CO2: 24 mmol/L (ref 20–29)
CREATININE: 1.33 mg/dL — AB (ref 0.76–1.27)
Chloride: 94 mmol/L — ABNORMAL LOW (ref 96–106)
GFR calc Af Amer: 75 mL/min/{1.73_m2} (ref >59)
GFR, EST NON AFRICAN AMERICAN: 65 mL/min/{1.73_m2} (ref >59)
GLOBULIN, TOTAL: 4.9 g/dL — AB (ref 1.5–4.5)
Glucose: 247 mg/dL — ABNORMAL HIGH (ref 65–99)
Potassium: 4.2 mmol/L (ref 3.5–5.2)
SODIUM: 132 mmol/L — AB (ref 134–144)
TOTAL PROTEIN: 8.3 g/dL (ref 6.0–8.5)

## 2017-12-31 LAB — MICROALBUMIN / CREATININE URINE RATIO
Creatinine, Urine: 97.5 mg/dL
MICROALBUM., U, RANDOM: 1733.5 ug/mL
Microalb/Creat Ratio: 1777.9 mg/g creat — ABNORMAL HIGH (ref 0.0–30.0)

## 2018-01-02 ENCOUNTER — Telehealth: Payer: Self-pay

## 2018-01-02 MED FILL — ACCU-CHEK AVIVA PLUS TEST S: 30 days supply | Qty: 100 | Fill #2

## 2018-01-02 NOTE — Telephone Encounter (Signed)
Patient was called and informed of lab results. 

## 2018-01-03 ENCOUNTER — Other Ambulatory Visit: Payer: Self-pay

## 2018-01-03 MED ORDER — INSULIN PEN NEEDLE 32G X 4 MM MISC: 5 refills | Status: AC

## 2018-01-07 DIAGNOSIS — E113413 Type 2 diabetes mellitus with severe nonproliferative diabetic retinopathy with macular edema, bilateral: Secondary | ICD-10-CM | POA: Diagnosis not present

## 2018-01-07 DIAGNOSIS — H2513 Age-related nuclear cataract, bilateral: Secondary | ICD-10-CM | POA: Diagnosis not present

## 2018-01-07 DIAGNOSIS — H40013 Open angle with borderline findings, low risk, bilateral: Secondary | ICD-10-CM | POA: Diagnosis not present

## 2018-01-07 DIAGNOSIS — H524 Presbyopia: Secondary | ICD-10-CM | POA: Diagnosis not present

## 2018-01-07 LAB — HM DIABETES EYE EXAM

## 2018-01-08 DIAGNOSIS — H5213 Myopia, bilateral: Secondary | ICD-10-CM | POA: Diagnosis not present

## 2018-01-08 IMAGING — CR DG RIBS W/ CHEST 3+V*L*
4 series · 4 of 4 positions shown · non-contrast
Comparison: Chest radiograph March 08, 2017

CLINICAL DATA: Fell yesterday.  LEFT rib pain.

EXAM:
LEFT RIBS AND CHEST - 3+ VIEW

[t chest supine]
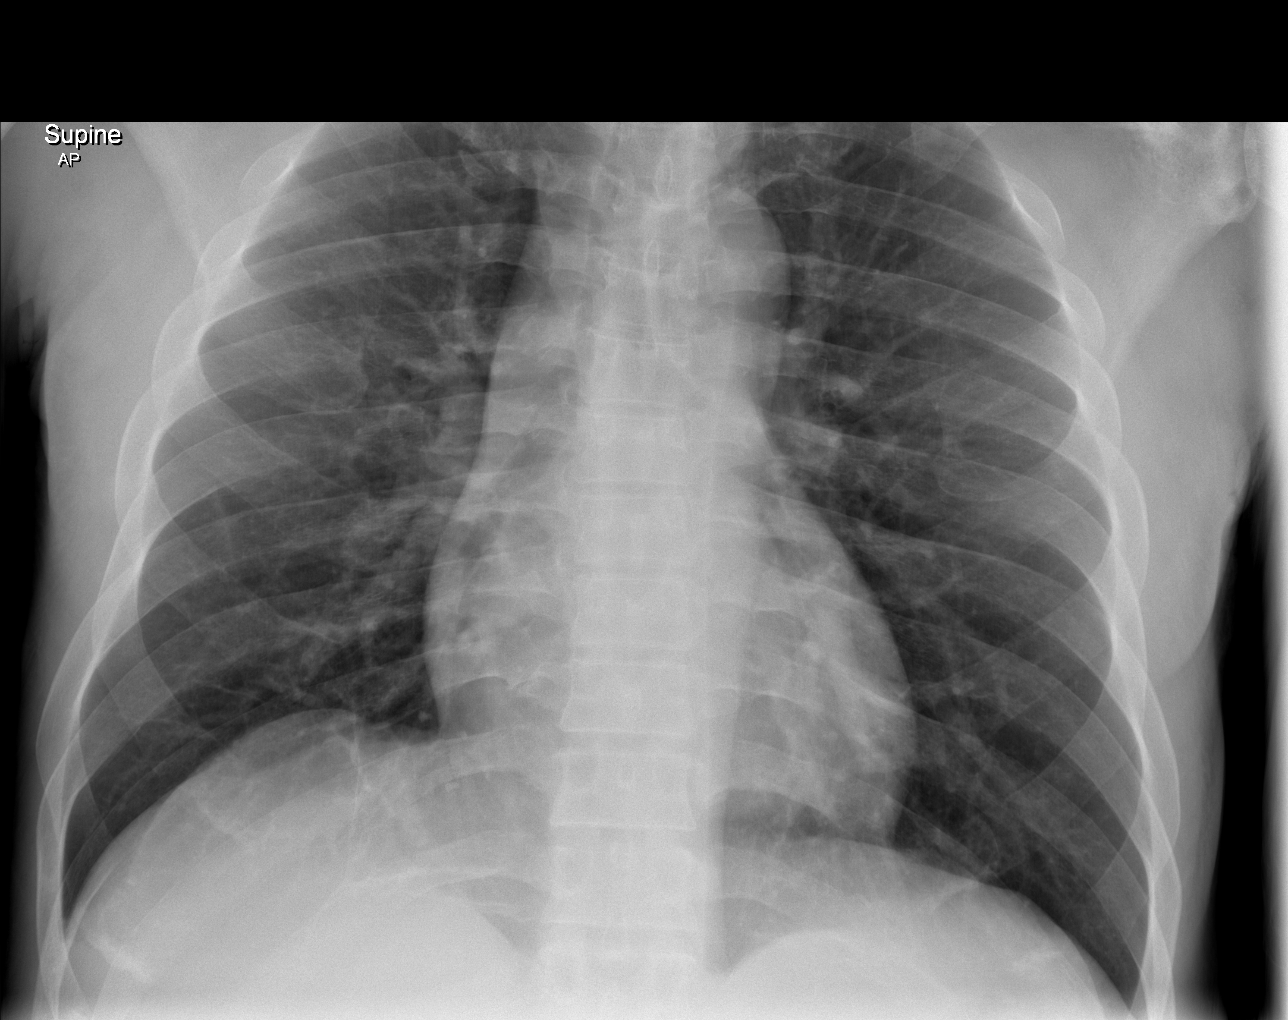

[t ribs ap upper left]
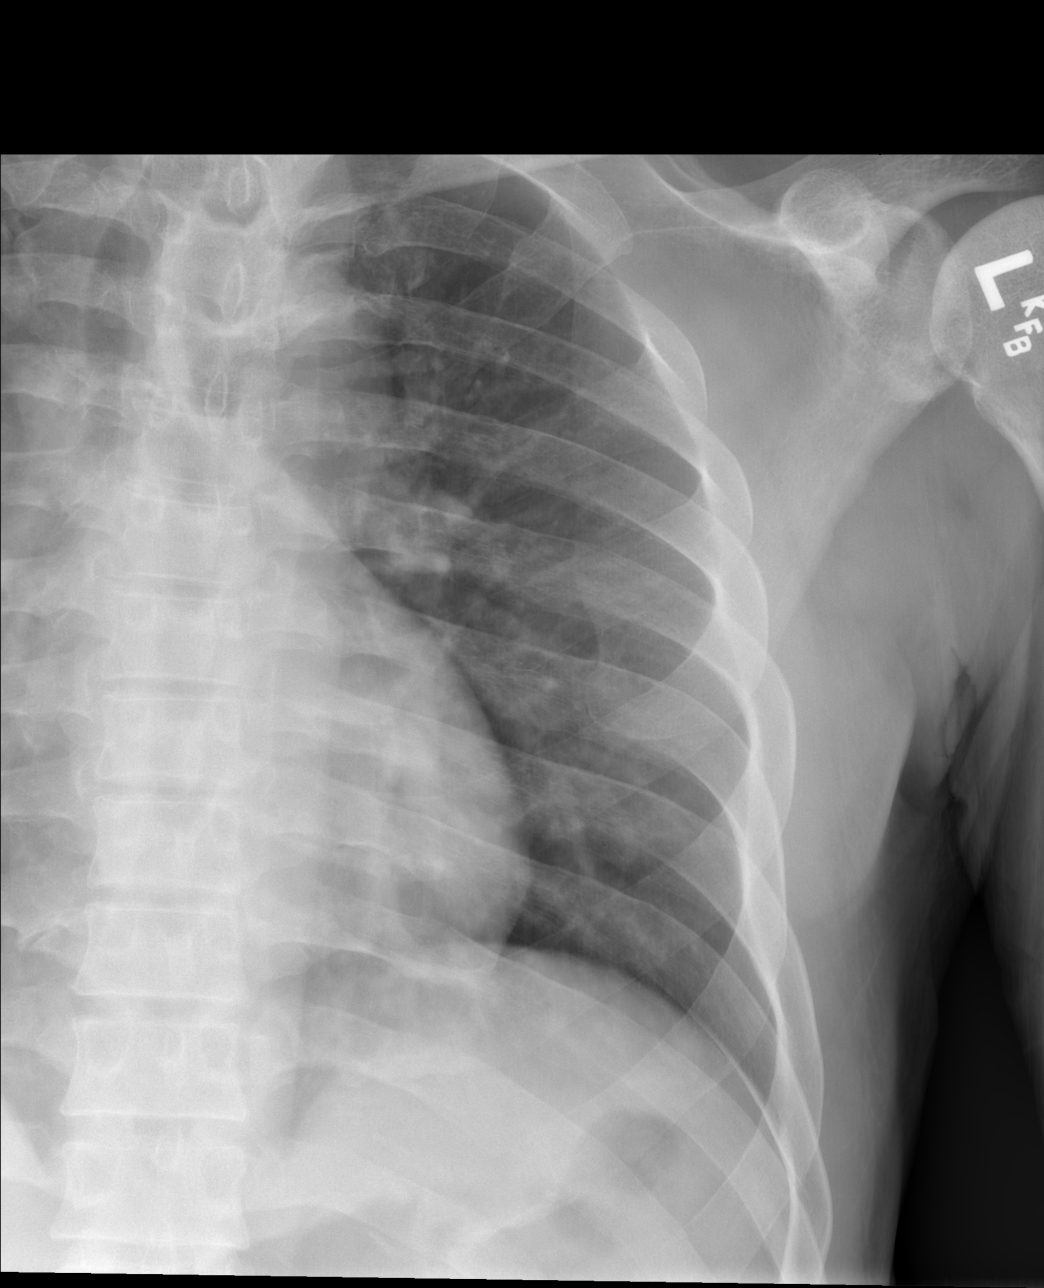

[t ribs ap lower left]
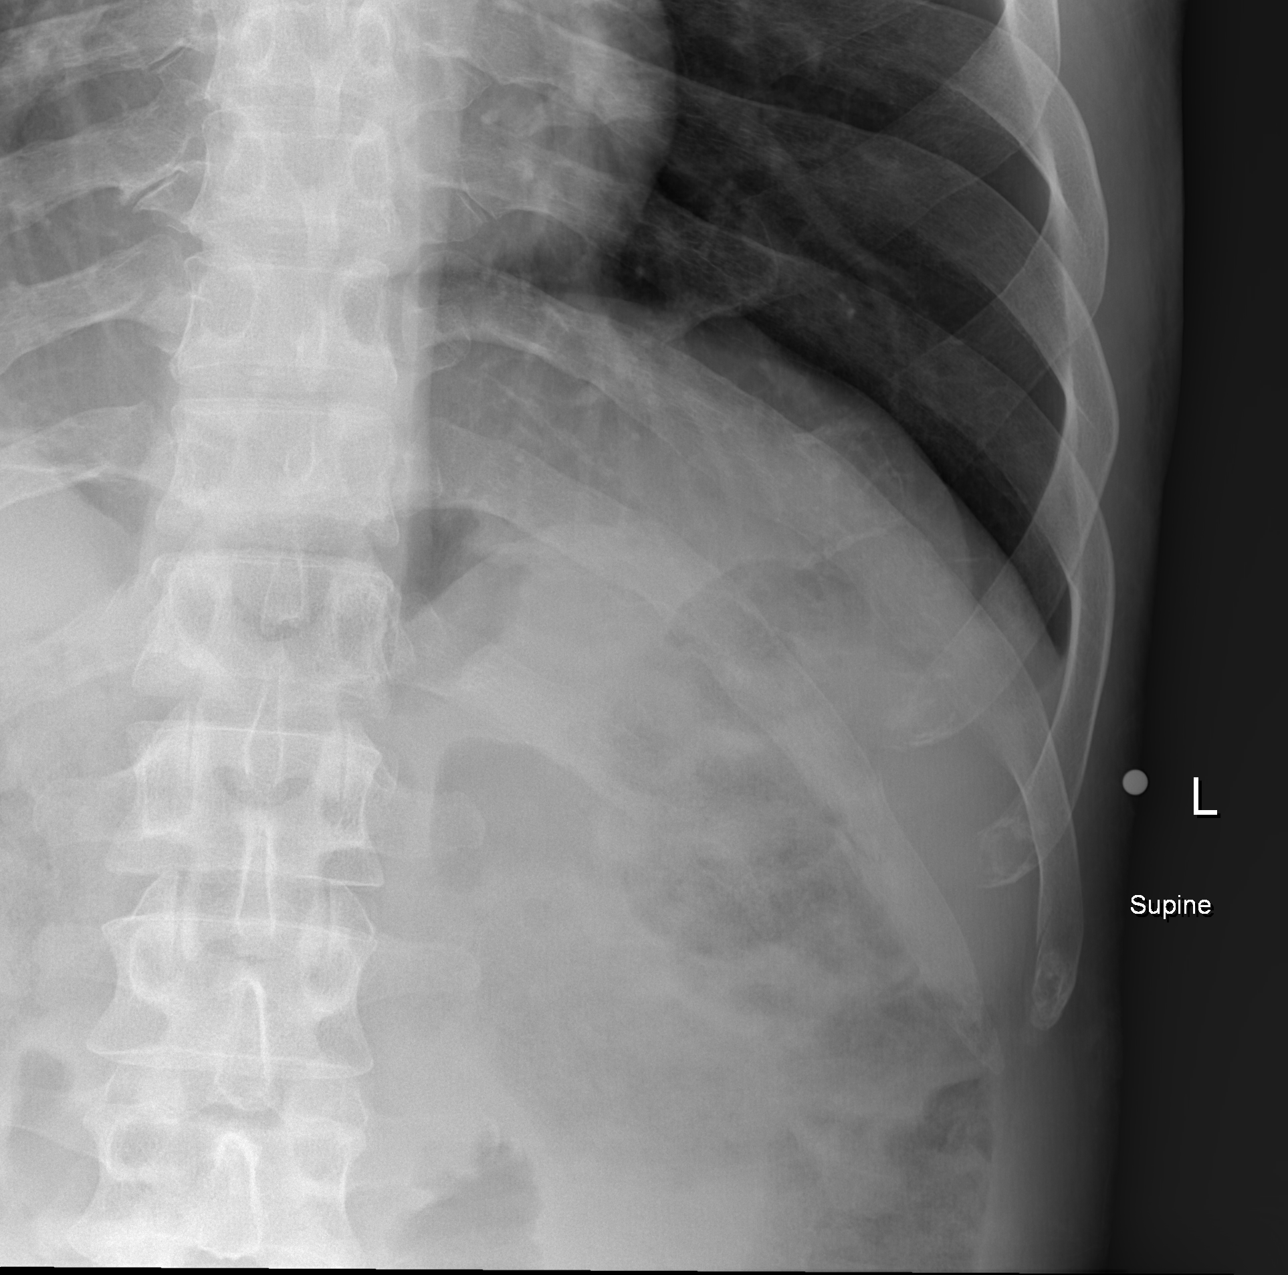

[t ribs lpo left]
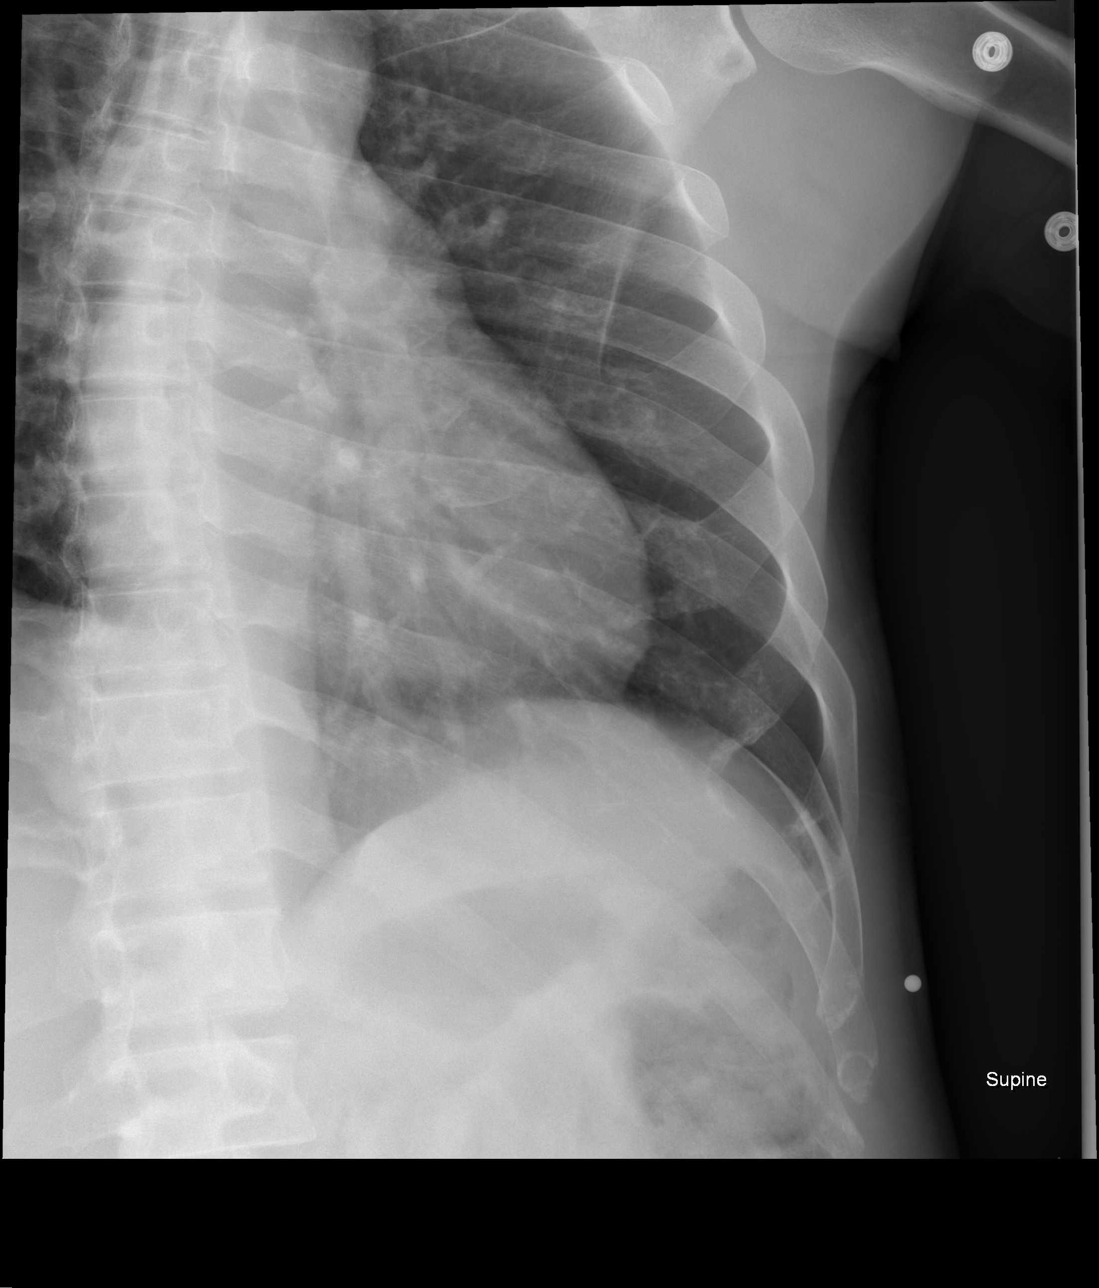

[4 of 4 positions shown; findings below may reference images not displayed]

FINDINGS: No fracture or other bone lesions are seen involving the ribs. There
is no evidence of pneumothorax or pleural effusion. Both lungs are
clear. Heart size and mediastinal contours are within normal limits.
IMPRESSION: Stable normal chest radiograph, no rib fracture deformity.

## 2018-01-15 MED FILL — LISINOPRIL 20 MG TAB: 20 | 30 days supply | Qty: 30 | Fill #2

## 2018-01-15 MED FILL — VIT D2 1.25 MG (50,000 UNIT: 1.25 MG | 28 days supply | Qty: 4 | Fill #3

## 2018-01-15 MED FILL — CYCLOBENZAPRINE 5 MG TABLET: 5 | 30 days supply | Qty: 90 | Fill #1

## 2018-01-15 MED FILL — PANTOPRAZOLE SOD DR 40 MG T: 40 | 30 days supply | Qty: 30 | Fill #3

## 2018-01-16 MED FILL — TRUEPLUS SYR 0.5ML 31GX5/16: 31G X 5/16" | 25 days supply | Qty: 100 | Fill #1

## 2018-01-27 MED FILL — TENOFOVIR DISOPROXIL FUMARA: 300 | 30 days supply | Qty: 30 | Fill #3

## 2018-01-27 MED FILL — LEVEMIR 100 UNITS/ML VIAL: 100 | 25 days supply | Qty: 10 | Fill #1

## 2018-01-27 MED FILL — GABAPENTIN 300 MG CAPSULE: 300 | 30 days supply | Qty: 120 | Fill #1

## 2018-02-04 ENCOUNTER — Encounter: Payer: Self-pay | Admitting: Physician Assistant

## 2018-02-04 ENCOUNTER — Ambulatory Visit (INDEPENDENT_AMBULATORY_CARE_PROVIDER_SITE_OTHER): Payer: Medicaid Other | Admitting: Physician Assistant

## 2018-02-04 VITALS — BP 90/70 | HR 104 | Ht 73.0 in | Wt 197.0 lb

## 2018-02-04 DIAGNOSIS — I2510 Atherosclerotic heart disease of native coronary artery without angina pectoris: Secondary | ICD-10-CM | POA: Diagnosis not present

## 2018-02-04 DIAGNOSIS — I959 Hypotension, unspecified: Secondary | ICD-10-CM | POA: Diagnosis not present

## 2018-02-04 DIAGNOSIS — Z8673 Personal history of transient ischemic attack (TIA), and cerebral infarction without residual deficits: Secondary | ICD-10-CM | POA: Diagnosis not present

## 2018-02-04 DIAGNOSIS — I5022 Chronic systolic (congestive) heart failure: Secondary | ICD-10-CM | POA: Diagnosis not present

## 2018-02-04 DIAGNOSIS — E108 Type 1 diabetes mellitus with unspecified complications: Secondary | ICD-10-CM

## 2018-02-04 MED ORDER — LISINOPRIL 10 MG PO TABS: 10.0000 mg | ORAL_TABLET | Freq: Every day | ORAL | 3 refills | Status: DC

## 2018-02-04 NOTE — Progress Notes (Signed)
Cardiology Office Note:    Date:  02/04/2018   ID:  Matthew Riley, DOB 03-09-1976, MRN [id number]  PCP:  Charlott Rakes, MD  Cardiologist:  Nelva Bush, MD   Referring MD: Charlott Rakes, MD   Chief Complaint  Patient presents with  . Follow-up    CHF    History of Present Illness:    Matthew Riley is a 42 y.o. male with uncontrolled diabetes, coronary artery disease, prior stroke, hypertension.  He was initially seen by Leanor Kail, PA-C in 11/2017 to establish with Cardiology.  He was previously followed in [place].  He reports an admission to a hospital in 2018 with DKA.  He describes having a myocardial infarction during that admission and was told he underwent PCI with stenting.  He does not recall being on dual antiplatelet Rx and did not have Cardiology follow up after DC.  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  He has been admitted several times here with DKA.     Matthew Riley returns for follow-up.  He is here alone.  Since last seen, we have not obtained records from his previous hospitalization in [place].  I reviewed his history with him again today.  He lists 3 different hospitals in [place] that he was admitted to in the past couple of years.  These included [facility] in [place], [place], [facility] and [facility] in [place], [place].  Today, he denies any chest discomfort and he does describe shortness of breath with some activities.  He sleeps on 3 pillows chronically.  He denies paroxysmal nocturnal dyspnea.  He does have some swelling in his left leg.  He denies syncope.  He continues to smoke cigarettes.  Prior CV studies:   The following studies were reviewed today:  Echo 09/01/17 Mild LVH, EF 40-45, normal diast function  ABIs 06/12/17 R 1.22, L 1.2  Echo 06/05/17 Mild conc LVH, EF 40-45, no RWMA, Gr 1 DD, mild MR, mild reduced RVSF  Echo 06/03/17 EF 35, HK  worse in ant and lateral walls, mild dilated RV, mod reduced RVSF   Past Medical History:  Diagnosis Date  . Coronary artery disease   . Diabetes mellitus without complication ([place])   . DKA (diabetic ketoacidoses) ([place]) 08/2017  . GERD (gastroesophageal reflux disease)   . Hepatitis B   . Hypertension   . Renal disorder   . Rheumatoid arthritis ([place])   . Stroke [facility]) 02/2017   Surgical Hx: The patient  has a past surgical history that includes Coronary angioplasty with stent.   Current Medications: Current Meds  Medication Sig  . ACCU-CHEK SOFTCLIX LANCETS lancets Use as instructed  . aspirin EC 81 MG tablet Take 1 tablet (81 mg total) by mouth daily.  Marland Kitchen atorvastatin (LIPITOR) 40 MG tablet Take 1 tablet (40 mg total) by mouth daily at 6 PM.  . Blood Glucose Monitoring Suppl (ACCU-CHEK AVIVA PLUS) w/Device KIT Check blood sugar 3 times daily.  . cyclobenzaprine (FLEXERIL) 10 MG tablet Take 1 tablet (10 mg total) by mouth 2 (two) times daily as needed for muscle spasms.  . ergocalciferol (DRISDOL) 50000 units capsule Take 1 capsule (50,000 Units total) by mouth once a week.  . furosemide (LASIX) 20 MG tablet Take 1 tablet (20 mg total) by mouth daily as needed.  . gabapentin (NEURONTIN) 300 MG capsule Take 2 capsules (600 mg total) by mouth  2 (two) times daily.  Marland Kitchen glucose blood (ACCU-CHEK AVIVA PLUS) test strip Use as instructed  . glucose blood (TRUE METRIX BLOOD GLUCOSE TEST) test strip 1 each by Other route 3 (three) times daily.  . insulin aspart (NOVOLOG) 100 UNIT/ML injection 0 to 12 units 3 times daily before meals as per sliding scale  . insulin detemir (LEVEMIR) 100 UNIT/ML injection Inject 0.4 mLs (40 Units total) into the skin at bedtime.  . Insulin Pen Needle (TRUEPLUS PEN NEEDLES) 32G X 4 MM MISC USE AS DIRECTED FOUR TIMES DAILY TO ADMINISTER INSULIN  . metoCLOPramide (REGLAN) 5 MG tablet Take 1 tablet (5 mg total) by mouth every 8 (eight) hours as needed for nausea.  .  nicotine (NICODERM CQ) 14 mg/24hr patch Place 1 patch (14 mg total) onto the skin daily. For 1 month then 7 mg / 24-hour patch for the next month  . pantoprazole (PROTONIX) 40 MG tablet Take 1 tablet (40 mg total) by mouth daily.  . polyethylene glycol (MIRALAX / GLYCOLAX) packet Take 17 g by mouth 2 (two) times daily.  . silver sulfADIAZINE (SILVADENE) 1 % cream Apply 1 application topically daily.  Marland Kitchen tenofovir (VIREAD) 300 MG tablet Take 1 tablet (300 mg total) by mouth daily.  Marland Kitchen thiamine 100 MG tablet Take 1 tablet (100 mg total) by mouth daily.  . traMADol (ULTRAM) 50 MG tablet Take 1 tablet (50 mg total) by mouth every 12 (twelve) hours as needed for severe pain.  . [DISCONTINUED] lisinopril (PRINIVIL,ZESTRIL) 20 MG tablet Take 1 tablet (20 mg total) by mouth daily.     Allergies:   Patient has no known allergies.   Social History   Tobacco Use  . Smoking status: Former Research scientist (life sciences)  . Smokeless tobacco: Never Used  . Tobacco comment: reports smoking for many years, quit years ago  Substance Use Topics  . Alcohol use: No    Frequency: Never    Comment: former use, last drink early 2018  . Drug use: Yes    Types: Marijuana    Comment: patient denies     Family Hx: The patient's family history includes Diabetes in his father and mother.  ROS:   Please see the history of present illness.    ROS All other systems reviewed and are negative.   EKGs/Labs/Other Test Reviewed:    EKG:  EKG is  ordered today.  The ekg ordered today demonstrates sinus tachycardia, heart rate 104, normal axis, septal Q waves, QTC 454, no acute ST-T wave changes  Recent Labs: 06/05/2017: B Natriuretic Peptide 132.9 09/04/2017: Magnesium 1.8 10/23/2017: Hemoglobin 12.4; Platelets 206; TSH 1.500 12/30/2017: ALT 64; BUN 25; Creatinine, Ser 1.33; Potassium 4.2; Sodium 132   Recent Lipid Panel Lab Results  Component Value Date/Time   CHOL 158 12/30/2017 10:30 AM   TRIG 89 12/30/2017 10:30 AM   HDL 43  12/30/2017 10:30 AM   CHOLHDL 3.7 12/30/2017 10:30 AM   LDLCALC 97 12/30/2017 10:30 AM    Physical Exam:    VS:  BP 90/70   Pulse (!) 104   Ht '6\' 1"'  (1.854 m)   Wt 197 lb (89.4 kg)   SpO2 96%   BMI 25.99 kg/m     Wt Readings from Last 3 Encounters:  02/04/18 197 lb (89.4 kg)  12/30/17 189 lb 6.4 oz (85.9 kg)  11/12/17 198 lb 8 oz (90 kg)     Physical Exam  Constitutional: He is oriented to person, place, and time. He appears well-developed  and well-nourished. No distress.  HENT:  Head: Normocephalic and atraumatic.  Eyes: No scleral icterus.  Neck: No JVD present. No thyromegaly present.  Cardiovascular: Normal rate and regular rhythm.  No murmur heard. Pulmonary/Chest: Effort normal. He has no rales.  Abdominal: Soft. He exhibits no distension.  Musculoskeletal: He exhibits no edema.  Lymphadenopathy:    He has no cervical adenopathy.  Neurological: He is alert and oriented to person, place, and time.  Skin: Skin is warm and dry.  Psychiatric: He has a normal mood and affect.    ASSESSMENT & PLAN:    Chronic systolic heart failure (HCC)  Etiology not entirely clear.  We have no records from his previous hospitalization in [place] from 2018.  His echocardiograms here in [place] did not demonstrate any wall motion abnormalities.  His most recent echocardiogram in 2019 demonstrated an EF of 40-45%.  At last visit, carvedilol was recommended.  He has not yet started this medication.  He is on lisinopril 20 mg daily.  As noted below, because of his hypotension, I have elected to not start him on carvedilol yet.  -Decrease lisinopril as outlined below  -Do not start carvedilol   -Obtain records from [facility] and [place] after records obtained  -Eventually try to start beta-blocker once blood pressure better  Hypotension, unspecified hypotension type  He is asymptomatic.  Decrease lisinopril to 10  mg daily.  Obtain BMET, CBC today.  History of stroke He has residual left-sided weakness.  Continue aspirin, statin  Coronary artery disease involving native coronary artery of native heart without angina pectoris Per history, he has a history of myocardial infarction.  As noted, we do not have records.  These will be requested.  Continue aspirin, statin.  Type 1 diabetes mellitus with complications (HCC) Continue follow-up with primary care.   Dispo:  Return in about 3 weeks (around 02/25/2018) for Close Follow Up, w/ Richardson Dopp, PA-C.   Medication Adjustments/Labs and Tests Ordered: Current medicines are reviewed at length with the patient today.  Concerns regarding medicines are outlined above.  Tests Ordered: Orders Placed This Encounter  Procedures  . Basic Metabolic Panel (BMET)  . CBC  . EKG 12-Lead   Medication Changes: Meds ordered this encounter  Medications  . lisinopril (PRINIVIL,ZESTRIL) 10 MG tablet    Sig: Take 1 tablet (10 mg total) by mouth daily.    Dispense:  90 tablet    Refill:  3    DOSE CHANGE    Signed, Richardson Dopp, PA-C  02/04/2018 4:38 PM    [facility] [place], [place], [place]  [address] Phone: [phone]; Fax: [phone]

## 2018-02-04 NOTE — Patient Instructions (Addendum)
Medication Instructions:  1. STOP TAKING COREG  2. DECREASE LISINOPRIL TO 10 MG DAILY; NEW RX HAS BEEN SENT IN FOR THE 10 MG TABLET   Labwork: TODAY BMET, CBC   Testing/Procedures: NONE ORDERED TODAY  Follow-Up: [facility] ON 03/04/18 @ 2:45 PM  Any Other Special Instructions Will Be Listed Below (If Applicable).     If you need a refill on your cardiac medications before your next appointment, please call your pharmacy.

## 2018-02-05 ENCOUNTER — Telehealth: Payer: Self-pay | Admitting: Physician Assistant

## 2018-02-05 LAB — BASIC METABOLIC PANEL
BUN / CREAT RATIO: 28 — AB (ref 9–20)
BUN: 41 mg/dL — AB (ref 6–24)
CALCIUM: 10.2 mg/dL (ref 8.7–10.2)
CO2: 26 mmol/L (ref 20–29)
CREATININE: 1.49 mg/dL — AB (ref 0.76–1.27)
Chloride: 95 mmol/L — ABNORMAL LOW (ref 96–106)
GFR calc Af Amer: 66 mL/min/{1.73_m2} (ref >59)
GFR calc non Af Amer: 57 mL/min/{1.73_m2} — ABNORMAL LOW (ref >59)
GLUCOSE: 208 mg/dL — AB (ref 65–99)
Potassium: 5.7 mmol/L — ABNORMAL HIGH (ref 3.5–5.2)
Sodium: 136 mmol/L (ref 134–144)

## 2018-02-05 LAB — CBC
HEMATOCRIT: 41 % (ref 37.5–51.0)
Hemoglobin: 14 g/dL (ref 13.0–17.7)
MCH: 30.6 pg (ref 26.6–33.0)
MCHC: 34.1 g/dL (ref 31.5–35.7)
MCV: 90 fL (ref 79–97)
PLATELETS: 273 10*3/uL (ref 150–450)
RBC: 4.57 x10E6/uL (ref 4.14–5.80)
RDW: 14.8 % (ref 12.3–15.4)
WBC: 9 10*3/uL (ref 3.4–10.8)

## 2018-02-05 NOTE — Telephone Encounter (Signed)
Records received from [facility] gave to Carol/Scott

## 2018-02-06 ENCOUNTER — Telehealth: Payer: Self-pay | Admitting: *Deleted

## 2018-02-06 ENCOUNTER — Telehealth: Payer: Self-pay | Admitting: Physician Assistant

## 2018-02-06 DIAGNOSIS — I5022 Chronic systolic (congestive) heart failure: Secondary | ICD-10-CM

## 2018-02-06 MED ORDER — LISINOPRIL 10 MG PO TABS: 10.0000 mg | ORAL_TABLET | Freq: Every day | ORAL | 3 refills | Status: AC

## 2018-02-06 MED ORDER — FUROSEMIDE 20 MG PO TABS: 20.0000 mg | ORAL_TABLET | Freq: Every day | ORAL | 6 refills | Status: AC | PRN

## 2018-02-06 NOTE — Telephone Encounter (Signed)
NOTES RECEIVED FROM [facility] [phone], GAVE TO CAROL FIATO,CMA [facility] WEAVER, PA ASSISTANT.

## 2018-02-06 NOTE — Telephone Encounter (Signed)
Pt has been notified of lab results and recommendations. Pt is agreeable to hold Lisinopril and Lasix. BMET 9/9. Pt is aware he can take the lasix PRN for wt gain of 3 lb's x 1 day or increased sob, edema. Pt thanked me for the call. Pt verbalized understanding with read back x 1.

## 2018-02-06 NOTE — Telephone Encounter (Signed)
Called the pt to go over his lab results and recommendations. Pt states he did not have a pen and paper right now as he was on the [employer] bus right now. I asked pt to call the office later when he gets home and I will be happy to go over his results. Pt is agreeable to call back later today.

## 2018-02-10 ENCOUNTER — Other Ambulatory Visit: Payer: Medicaid Other

## 2018-02-12 ENCOUNTER — Telehealth: Payer: Self-pay | Admitting: *Deleted

## 2018-02-12 NOTE — Telephone Encounter (Signed)
Called pt in regards to he missed his lab appt scheduled for 9/9. No answer.

## 2018-02-12 NOTE — Telephone Encounter (Signed)
-----   Message from Beatrice Lecher, [place] sent at 02/11/2018  5:34 PM EDT ----- Please follow up with patient - he has not had follow up BMET yet. Tereso Newcomer, PA-C    02/11/2018 5:34 PM

## 2018-02-17 ENCOUNTER — Other Ambulatory Visit: Payer: Self-pay

## 2018-02-17 MED FILL — METOCLOPRAMIDE 5 MG TABLET: 5 | 30 days supply | Qty: 90 | Fill #1

## 2018-02-17 MED FILL — PANTOPRAZOLE SOD DR 40 MG T: 40 | 30 days supply | Qty: 30 | Fill #4

## 2018-02-18 ENCOUNTER — Other Ambulatory Visit: Payer: Medicaid Other

## 2018-02-18 ENCOUNTER — Other Ambulatory Visit: Payer: Self-pay

## 2018-02-18 DIAGNOSIS — I5022 Chronic systolic (congestive) heart failure: Secondary | ICD-10-CM

## 2018-02-18 DIAGNOSIS — I110 Hypertensive heart disease with heart failure: Secondary | ICD-10-CM

## 2018-02-18 DIAGNOSIS — I2510 Atherosclerotic heart disease of native coronary artery without angina pectoris: Secondary | ICD-10-CM

## 2018-02-18 DIAGNOSIS — I10 Essential (primary) hypertension: Secondary | ICD-10-CM | POA: Diagnosis not present

## 2018-02-18 DIAGNOSIS — E108 Type 1 diabetes mellitus with unspecified complications: Secondary | ICD-10-CM | POA: Diagnosis not present

## 2018-02-18 DIAGNOSIS — H524 Presbyopia: Secondary | ICD-10-CM | POA: Diagnosis not present

## 2018-02-18 LAB — BASIC METABOLIC PANEL
BUN/Creatinine Ratio: 20 (ref 9–20)
BUN: 30 mg/dL — AB (ref 6–24)
CO2: 26 mmol/L (ref 20–29)
CREATININE: 1.48 mg/dL — AB (ref 0.76–1.27)
Calcium: 9.5 mg/dL (ref 8.7–10.2)
Chloride: 94 mmol/L — ABNORMAL LOW (ref 96–106)
GFR calc Af Amer: 66 mL/min/{1.73_m2} (ref >59)
GFR, EST NON AFRICAN AMERICAN: 57 mL/min/{1.73_m2} — AB (ref >59)
GLUCOSE: 391 mg/dL — AB (ref 65–99)
Potassium: 5 mmol/L (ref 3.5–5.2)
SODIUM: 134 mmol/L (ref 134–144)

## 2018-02-18 NOTE — Addendum Note (Signed)
Addended by: Bertram Millard on: 02/18/2018 09:18 AM   Modules accepted: Orders

## 2018-02-20 ENCOUNTER — Encounter: Payer: Self-pay | Admitting: Physician Assistant

## 2018-02-20 MED ORDER — ERGOCALCIFEROL 1.25 MG (50000 UT) PO CAPS: 50000.0000 [IU] | ORAL_CAPSULE | ORAL | 1 refills | Status: AC

## 2018-02-20 MED FILL — VIT D2 1.25 MG (50,000 UNIT: 1.25 MG | 28 days supply | Qty: 4 | Fill #0

## 2018-02-21 MED FILL — LISINOPRIL 10 MG TABS: 10 | 30 days supply | Qty: 30 | Fill #0

## 2018-02-21 MED FILL — TENOFOVIR DISOPROXIL FUMARA: 300 | 30 days supply | Qty: 30 | Fill #4

## 2018-02-21 MED FILL — LEVEMIR 100 UNITS/ML VIAL: 100 | 25 days supply | Qty: 10 | Fill #2

## 2018-02-21 MED FILL — TRUEPLUS SYR 0.5ML 31GX5/16: 31G X 5/16" | 25 days supply | Qty: 100 | Fill #2

## 2018-02-23 DIAGNOSIS — E1165 Type 2 diabetes mellitus with hyperglycemia: Secondary | ICD-10-CM | POA: Diagnosis not present

## 2018-02-23 DIAGNOSIS — I499 Cardiac arrhythmia, unspecified: Secondary | ICD-10-CM | POA: Diagnosis not present

## 2018-02-23 DIAGNOSIS — R0689 Other abnormalities of breathing: Secondary | ICD-10-CM | POA: Diagnosis not present

## 2018-02-23 DIAGNOSIS — R404 Transient alteration of awareness: Secondary | ICD-10-CM | POA: Diagnosis not present

## 2018-03-04 ENCOUNTER — Ambulatory Visit: Payer: Medicaid Other | Admitting: Physician Assistant

## 2018-03-04 DIAGNOSIS — 419620001 Death: Secondary | SNOMED CT | POA: Diagnosis not present

## 2018-03-04 NOTE — Progress Notes (Deleted)
Cardiology Office Note:    Date:  03/04/2018   ID:  Matthew Riley, DOB 12-27-1975, MRN [id number]  PCP:  Matthew Register, MD  Cardiologist:  Matthew Kendall, MD *** Electrophysiologist:  None   Referring MD: Matthew Register, MD   No chief complaint on file. ***  History of Present Illness:    Matthew Riley is a 42 y.o. male with coronary artery disease, prior stroke, hypertension, reported coronary artery disease.  He established with Cardiology in 11/2017.  He previously lived in [place].  The patient notes a hx of a MI in 2018 treated with stenting.  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  He was admitted to [facility] in 05/2017 with sepsis.  His EF was initially 35% but improved to 40-45%.  He was admitted again in 08/2017 with DKA. An echocardiogram during that admission demonstrated an EF of 40-45%.  He was last seen here 02/04/18.  We requested records from [place] from 2 medical centers.  We received records of a visit to the ED for back pain and an admission for DKA.  He has not been able to tolerate beta-blocker or ACE inhibitor due to low BP and worsening renal function.  ***  Matthew Riley ***  Prior CV studies:   The following studies were reviewed today:  *** Echo 09/01/17 Mild LVH, EF 40-45, normal diast function  ABIs 06/12/17 R 1.22, L 1.2  Echo 06/05/17 Mild conc LVH, EF 40-45, no RWMA, Gr 1 DD, mild MR, mild reduced RVSF  Echo 06/03/17 EF 35, HK worse in ant and lateral walls, mild dilated RV, mod reduced RVSF  Past Medical History:  Diagnosis Date  . Coronary artery disease   . Diabetes mellitus without complication (HCC)   . DKA (diabetic ketoacidoses) (HCC) 08/2017  . GERD (gastroesophageal reflux disease)   . Hepatitis B   . Hypertension   . Renal disorder   . Rheumatoid arthritis (HCC)   . Stroke [facility]) 02/2017   Surgical Hx: The patient  has a past surgical history that includes Coronary angioplasty with stent.   Current  Medications: No outpatient medications have been marked as taking for the 03/04/18 encounter (Appointment) with Matthew Riley T, PA-C.     Allergies:   Patient has no known allergies.   Social History   Tobacco Use  . Smoking status: Former Games developer  . Smokeless tobacco: Never Used  . Tobacco comment: reports smoking for many years, quit years ago  Substance Use Topics  . Alcohol use: No    Frequency: Never    Comment: former use, last drink early 2018  . Drug use: Yes    Types: Marijuana    Comment: patient denies     Family Hx: The patient's family history includes Diabetes in his father and mother.  ROS:   Please see the history of present illness.    ROS All other systems reviewed and are negative.   EKGs/Labs/Other Test Reviewed:    EKG:  EKG is *** ordered today.  The ekg ordered today demonstrates ***  Recent Labs: 06/05/2017: B Natriuretic Peptide 132.9 09/04/2017: Magnesium 1.8 10/23/2017: TSH 1.500 12/30/2017: ALT 64 02/04/2018: Hemoglobin 14.0; Platelets 273 02/18/2018: BUN 30; Creatinine, Ser 1.48; Potassium 5.0; Sodium 134   Recent Lipid Panel Lab Results  Component Value Date/Time   CHOL 158 12/30/2017 10:30 AM   TRIG 89 12/30/2017 10:30 AM   HDL 43 12/30/2017 10:30 AM   CHOLHDL 3.7 12/30/2017  10:30 AM   LDLCALC 97 12/30/2017 10:30 AM    Physical Exam:    VS:  There were no vitals taken for this visit.    Wt Readings from Last 3 Encounters:  02/04/18 197 lb (89.4 kg)  12/30/17 189 lb 6.4 oz (85.9 kg)  11/12/17 198 lb 8 oz (90 kg)     ***Physical Exam  ASSESSMENT & PLAN:    Chronic systolic CHF (congestive heart failure) (HCC)  Essential hypertension  Coronary artery disease involving native coronary artery of native heart without angina pectoris  Type 1 diabetes mellitus with complications [facility])***  Chronic systolic heart failure (HCC)  Etiology not entirely clear.  We have no records from his previous hospitalization in [place] from 2018.   His echocardiograms here in [place] did not demonstrate any wall motion abnormalities.  His most recent echocardiogram in 2019 demonstrated an EF of 40-45%.  At last visit, carvedilol was recommended.  He has not yet started this medication.  He is on lisinopril 20 mg daily.  As noted below, because of his hypotension, I have elected to not start him on carvedilol yet.             -Decrease lisinopril as outlined below             -Do not start carvedilol                         -Obtain records from [facility], [place]. [facility] and [facility]              -Follow-up after records obtained             -Eventually try to start beta-blocker once blood pressure better  Hypotension, unspecified hypotension type  He is asymptomatic.  Decrease lisinopril to 10 mg daily.  Obtain BMET, CBC today.  History of stroke He has residual left-sided weakness.  Continue aspirin, statin  Coronary artery disease involving native coronary artery of native heart without angina pectoris Per history, he has a history of myocardial infarction.  As noted, we do not have records.  These will be requested.  Continue aspirin, statin.  Type 1 diabetes mellitus with complications (HCC) Continue follow-up with primary care.   Dispo:  No follow-ups on file.   Medication Adjustments/Labs and Tests Ordered: Current medicines are reviewed at length with the patient today.  Concerns regarding medicines are outlined above.  Tests Ordered: No orders of the defined types were placed in this encounter.  Medication Changes: No orders of the defined types were placed in this encounter.   Signed, Matthew Newcomer, PA-C  03/04/2018 1:40 PM    [facility] [address] [place], [place], [place]  [address] Phone: [phone]; Fax: [phone]

## 2018-03-04 DEATH — deceased

## 2018-04-03 ENCOUNTER — Ambulatory Visit: Payer: Medicaid Other | Admitting: Family Medicine

## 2018-06-18 IMAGING — CT CT HEAD W/O CM
4 series · 15 of 47 positions shown, 17 images · non-contrast
Comparison: Head CT 06/06/2017

CLINICAL DATA: Altered mental status. Found unresponsive. Probable
DKA.

EXAM:
CT HEAD WITHOUT CONTRAST
TECHNIQUE: Contiguous axial images were obtained from the base of the skull
through the vertex without intravenous contrast.

[Series 3: head wo · axial · 0.46mm/px · z∈[-181,-51]mm · 7 of 36 slices shown, 9 images]
[im 5/36  brain]
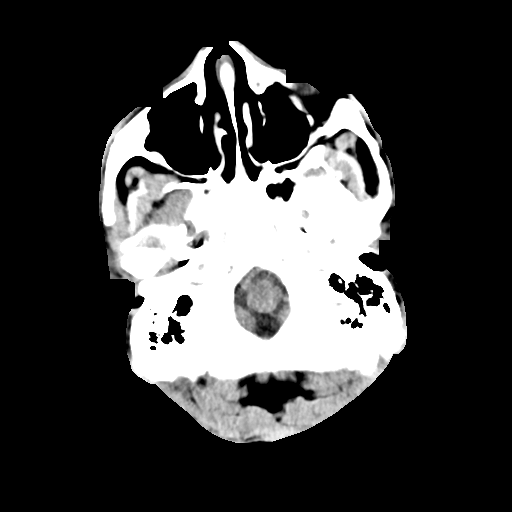
[im 5/36  bone]
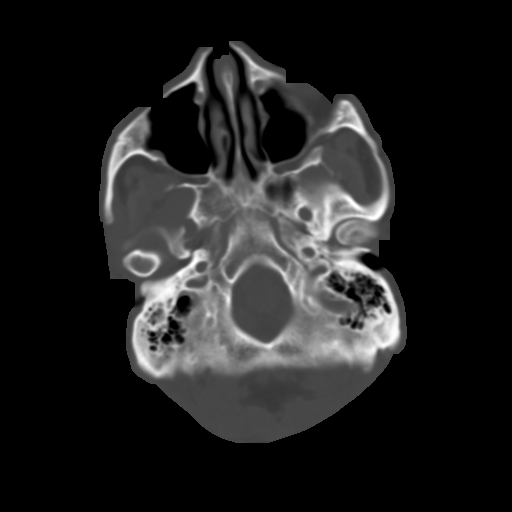
[im 9/36  brain]
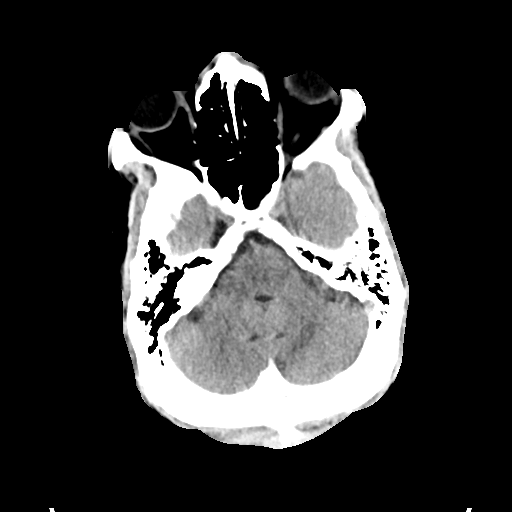
[im 14/36  brain]
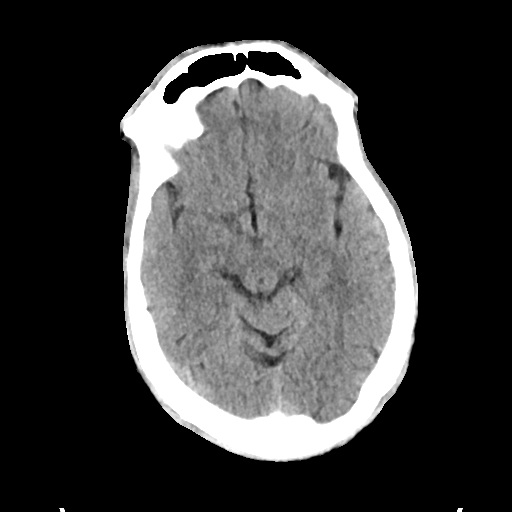
[im 18/36  brain]
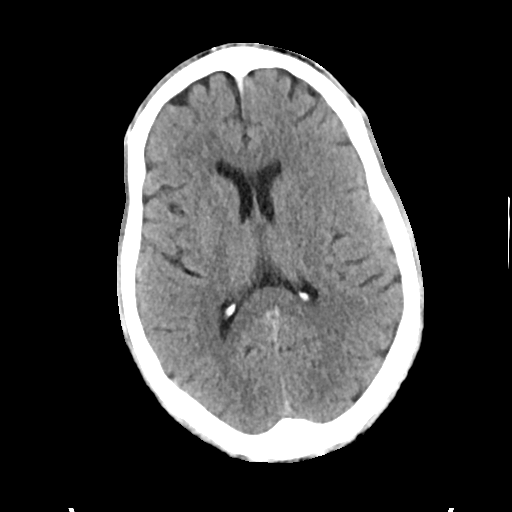
[im 22/36  brain]
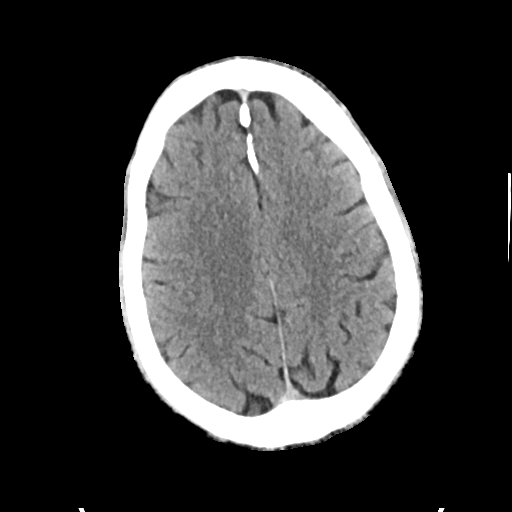
[im 22/36  bone]
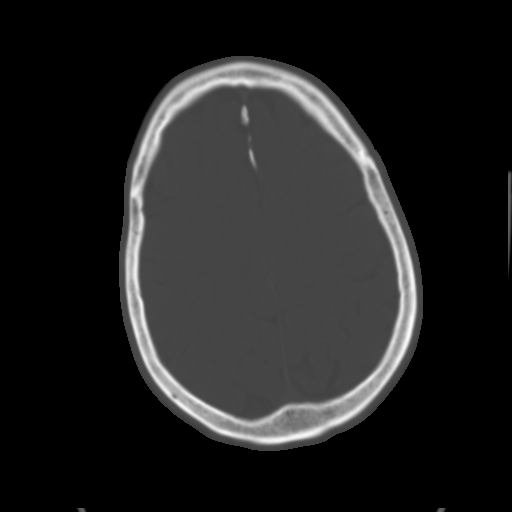
[im 27/36  brain]
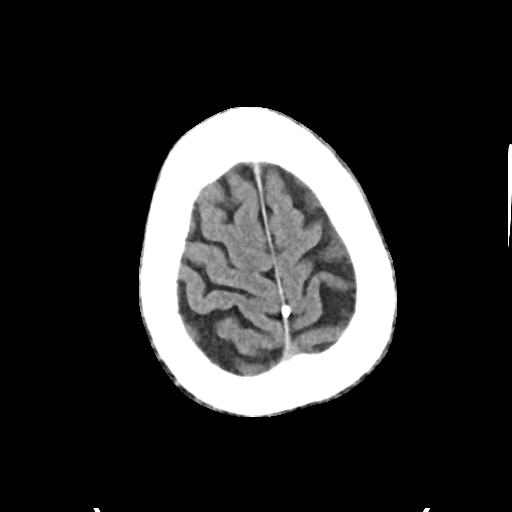
[im 31/36  brain]
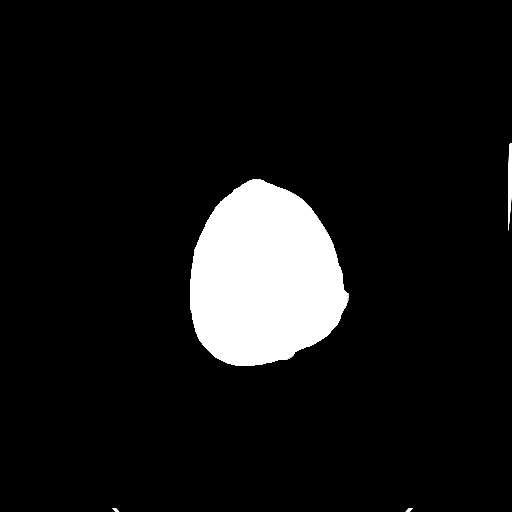

[Series 4: head bone · axial · 0.46mm/px · z∈[-185,-167]mm · 2 of 88 slices shown]
[im 9/88  bone]
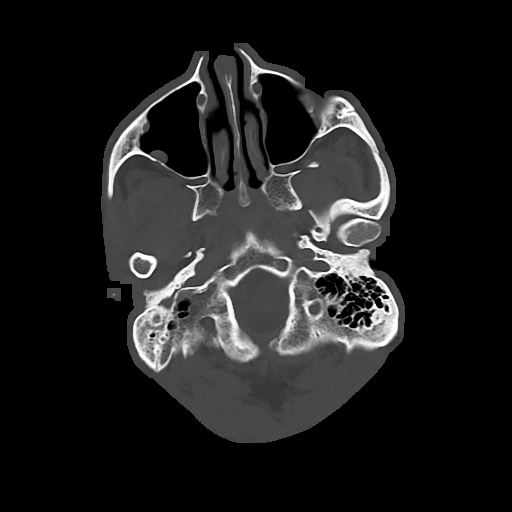
[im 18/88  bone]
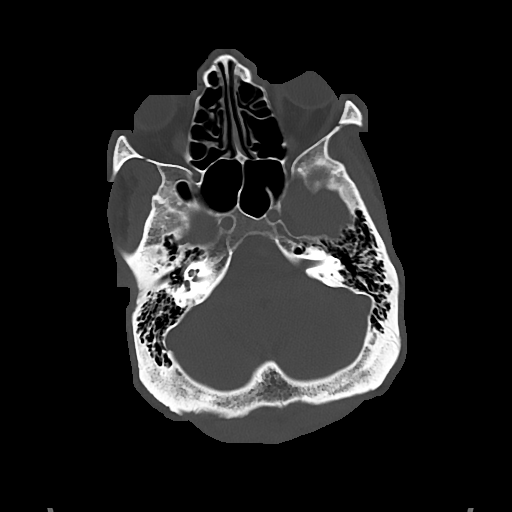

[Series 5: cor soft · coronal · 0.34mm/px · 3 of 78 slices shown]
[im 26/78  brain]
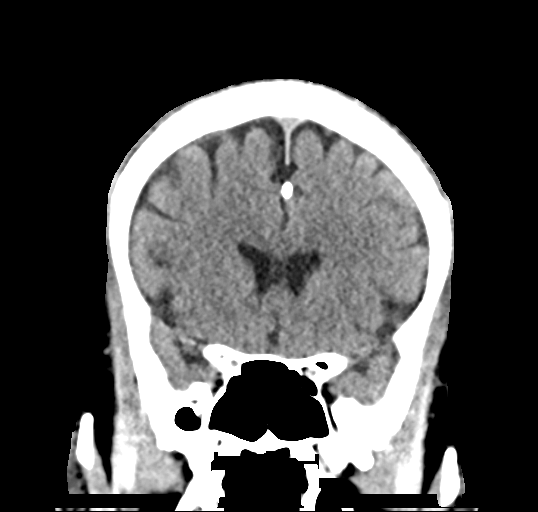
[im 35/78  brain]
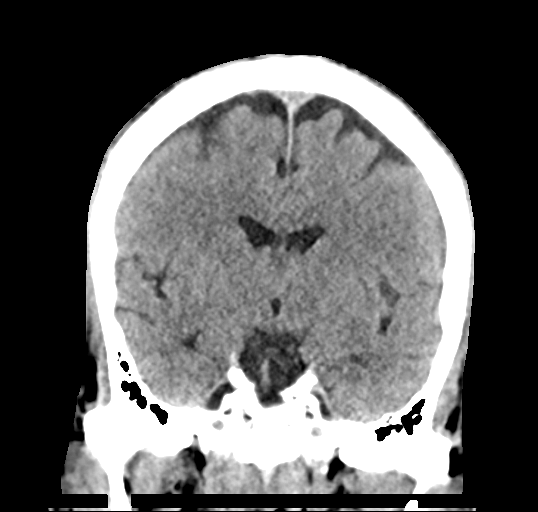
[im 43/78  brain]
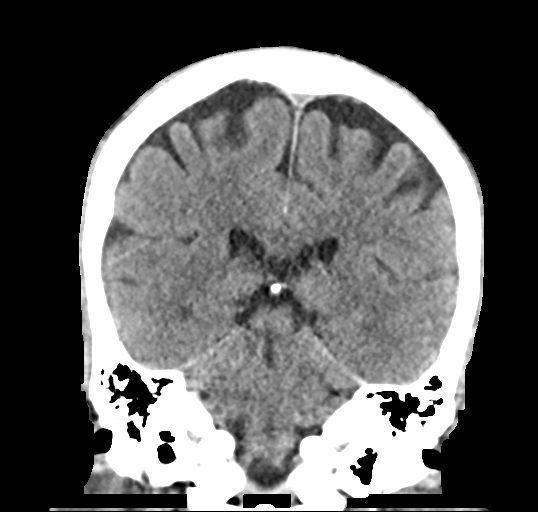

[Series 6: sag soft · sagittal · 0.34mm/px · 3 of 64 slices shown]
[im 22/64  brain]
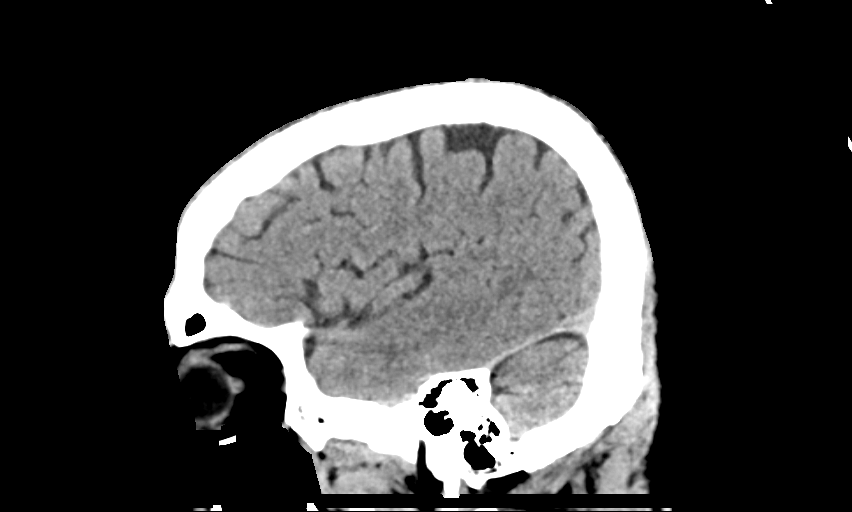
[im 32/64  brain]
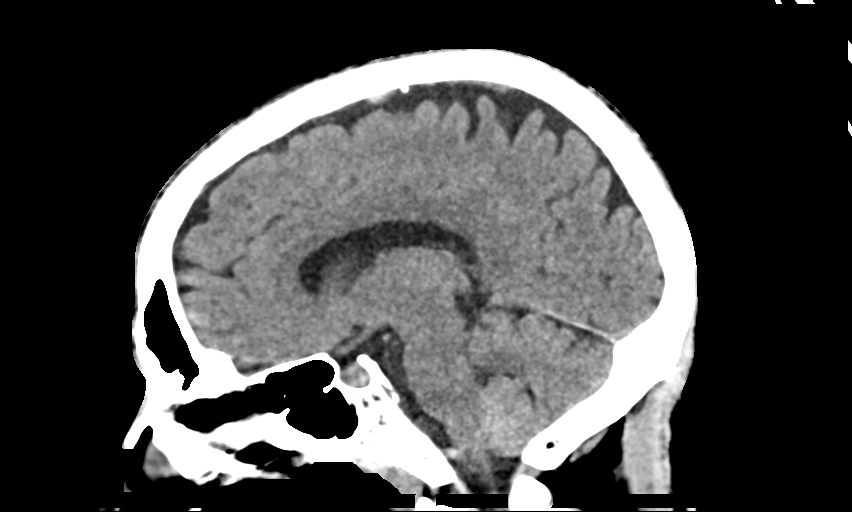
[im 43/64  brain]
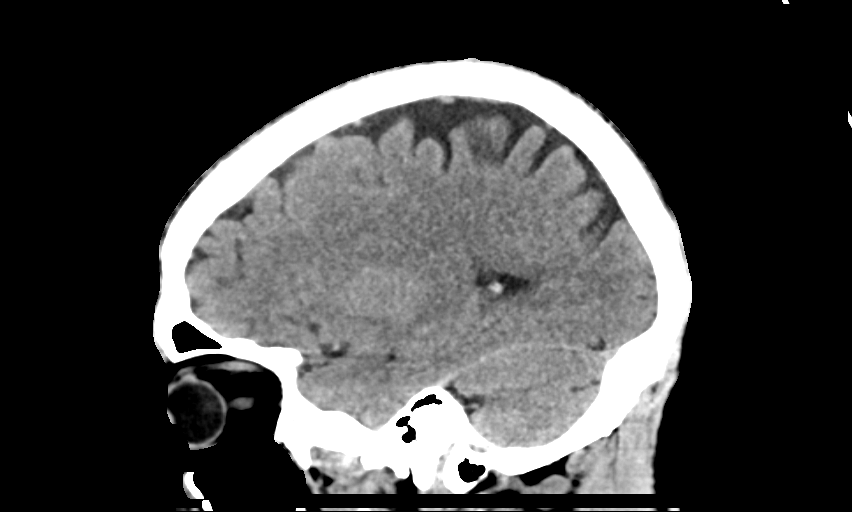

[15 of 47 positions shown; findings below may reference images not displayed]

FINDINGS: Brain: No mass lesion, intraparenchymal hemorrhage or extra-axial
collection. No evidence of acute cortical infarct. Normal appearance
of the brain parenchyma and extra axial spaces for age.

Vascular: No hyperdense vessel or unexpected vascular calcification.

Skull: Normal visualized skull base, calvarium and extracranial soft
tissues.

Sinuses/Orbits: No sinus fluid levels or advanced mucosal
thickening. No mastoid effusion. Normal orbits.
IMPRESSION: Normal head CT.
